# Patient Record
Sex: Female | Born: 1960 | Race: White | Hispanic: No | Smoking: Never smoker
Health system: Southern US, Community
[De-identification: ages and names within clinical notes are randomized; demographics above are authoritative.]

## PROBLEM LIST (undated history)

## (undated) ENCOUNTER — Ambulatory Visit: Admission: EM | Payer: BC Managed Care – PPO | Source: Home / Self Care

## (undated) DIAGNOSIS — E039 Hypothyroidism, unspecified: Secondary | ICD-10-CM

## (undated) DIAGNOSIS — Z1239 Encounter for other screening for malignant neoplasm of breast: Secondary | ICD-10-CM

## (undated) DIAGNOSIS — Z1211 Encounter for screening for malignant neoplasm of colon: Secondary | ICD-10-CM

## (undated) DIAGNOSIS — Z9989 Dependence on other enabling machines and devices: Secondary | ICD-10-CM

## (undated) DIAGNOSIS — E669 Obesity, unspecified: Secondary | ICD-10-CM

## (undated) DIAGNOSIS — I509 Heart failure, unspecified: Secondary | ICD-10-CM

## (undated) DIAGNOSIS — Z1389 Encounter for screening for other disorder: Secondary | ICD-10-CM

## (undated) DIAGNOSIS — G4730 Sleep apnea, unspecified: Secondary | ICD-10-CM

## (undated) DIAGNOSIS — J189 Pneumonia, unspecified organism: Secondary | ICD-10-CM

## (undated) DIAGNOSIS — J449 Chronic obstructive pulmonary disease, unspecified: Secondary | ICD-10-CM

## (undated) DIAGNOSIS — I4891 Unspecified atrial fibrillation: Secondary | ICD-10-CM

## (undated) DIAGNOSIS — I10 Essential (primary) hypertension: Secondary | ICD-10-CM

## (undated) DIAGNOSIS — R928 Other abnormal and inconclusive findings on diagnostic imaging of breast: Secondary | ICD-10-CM

## (undated) HISTORY — DX: Other abnormal and inconclusive findings on diagnostic imaging of breast: R92.8

## (undated) HISTORY — DX: Pneumonia, unspecified organism: J18.9

## (undated) HISTORY — DX: Heart failure, unspecified: I50.9

## (undated) HISTORY — DX: Unspecified atrial fibrillation: I48.91

## (undated) HISTORY — DX: Sleep apnea, unspecified: G47.30

## (undated) HISTORY — DX: Encounter for screening for other disorder: Z13.89

## (undated) HISTORY — DX: Encounter for screening for malignant neoplasm of colon: Z12.11

## (undated) HISTORY — DX: Hypothyroidism, unspecified: E03.9

## (undated) HISTORY — DX: Encounter for other screening for malignant neoplasm of breast: Z12.39

## (undated) HISTORY — DX: Obesity, unspecified: E66.9

## (undated) HISTORY — DX: Essential (primary) hypertension: I10

---

## 2001-04-10 HISTORY — PX: ANTERIOR CRUCIATE LIGAMENT REPAIR: SHX115

## 2004-07-03 ENCOUNTER — Emergency Department: Payer: Self-pay | Admitting: General Practice

## 2008-07-30 ENCOUNTER — Ambulatory Visit: Payer: Self-pay | Admitting: Unknown Physician Specialty

## 2009-04-10 HISTORY — PX: ABDOMINAL HYSTERECTOMY: SHX81

## 2009-04-10 HISTORY — PX: COLONOSCOPY: SHX174

## 2010-02-02 ENCOUNTER — Ambulatory Visit: Payer: Self-pay | Admitting: Unknown Physician Specialty

## 2010-02-15 ENCOUNTER — Ambulatory Visit: Payer: Self-pay | Admitting: Unknown Physician Specialty

## 2010-02-18 ENCOUNTER — Ambulatory Visit: Payer: Self-pay | Admitting: Unknown Physician Specialty

## 2010-02-25 ENCOUNTER — Ambulatory Visit: Payer: Self-pay | Admitting: Unknown Physician Specialty

## 2010-03-16 ENCOUNTER — Ambulatory Visit: Payer: Self-pay | Admitting: Unknown Physician Specialty

## 2010-03-23 ENCOUNTER — Inpatient Hospital Stay: Payer: Self-pay | Admitting: Unknown Physician Specialty

## 2010-03-25 LAB — PATHOLOGY REPORT

## 2010-09-16 ENCOUNTER — Ambulatory Visit: Payer: Self-pay | Admitting: Family Medicine

## 2010-09-23 ENCOUNTER — Ambulatory Visit: Payer: Self-pay | Admitting: Urology

## 2010-09-26 ENCOUNTER — Ambulatory Visit: Payer: Self-pay | Admitting: Urology

## 2011-04-11 DIAGNOSIS — R928 Other abnormal and inconclusive findings on diagnostic imaging of breast: Secondary | ICD-10-CM

## 2011-04-11 HISTORY — DX: Other abnormal and inconclusive findings on diagnostic imaging of breast: R92.8

## 2011-04-11 HISTORY — PX: KNEE SURGERY: SHX244

## 2011-11-13 ENCOUNTER — Ambulatory Visit: Payer: Self-pay | Admitting: General Practice

## 2011-11-13 LAB — URINALYSIS, COMPLETE
Bilirubin,UR: NEGATIVE
Glucose,UR: NEGATIVE mg/dL (ref 0–75)
Ketone: NEGATIVE
Nitrite: NEGATIVE
Specific Gravity: 1.023 (ref 1.003–1.030)

## 2011-11-13 LAB — BASIC METABOLIC PANEL
Anion Gap: 7 (ref 7–16)
Calcium, Total: 9.4 mg/dL (ref 8.5–10.1)
Chloride: 104 mmol/L (ref 98–107)
Co2: 29 mmol/L (ref 21–32)
Creatinine: 0.86 mg/dL (ref 0.60–1.30)
EGFR (African American): >60
Osmolality: 281 (ref 275–301)

## 2011-11-13 LAB — CBC
HGB: 13.2 g/dL (ref 12.0–16.0)
MCH: 28 pg (ref 26.0–34.0)
MCV: 82 fL (ref 80–100)
RBC: 4.71 10*6/uL (ref 3.80–5.20)
WBC: 8 10*3/uL (ref 3.6–11.0)

## 2011-11-14 LAB — URINE CULTURE

## 2011-11-22 ENCOUNTER — Ambulatory Visit: Payer: Self-pay | Admitting: Family Medicine

## 2011-11-24 ENCOUNTER — Ambulatory Visit: Payer: Self-pay | Admitting: Family Medicine

## 2011-11-27 ENCOUNTER — Inpatient Hospital Stay: Payer: Self-pay

## 2011-11-28 LAB — BASIC METABOLIC PANEL
Anion Gap: 6 — ABNORMAL LOW (ref 7–16)
BUN: 13 mg/dL (ref 7–18)
Calcium, Total: 8.1 mg/dL — ABNORMAL LOW (ref 8.5–10.1)
Chloride: 107 mmol/L (ref 98–107)
Co2: 29 mmol/L (ref 21–32)
Creatinine: 0.78 mg/dL (ref 0.60–1.30)
EGFR (African American): >60
Osmolality: 285 (ref 275–301)
Potassium: 3.8 mmol/L (ref 3.5–5.1)

## 2011-11-28 LAB — PLATELET COUNT: Platelet: 147 10*3/uL — ABNORMAL LOW (ref 150–440)

## 2011-11-28 LAB — HEMOGLOBIN: HGB: 10.1 g/dL — ABNORMAL LOW (ref 12.0–16.0)

## 2011-11-29 LAB — BASIC METABOLIC PANEL
BUN: 11 mg/dL (ref 7–18)
Calcium, Total: 8.2 mg/dL — ABNORMAL LOW (ref 8.5–10.1)
Chloride: 106 mmol/L (ref 98–107)
Co2: 30 mmol/L (ref 21–32)
EGFR (Non-African Amer.): >60
Glucose: 121 mg/dL — ABNORMAL HIGH (ref 65–99)
Osmolality: 282 (ref 275–301)
Potassium: 4.1 mmol/L (ref 3.5–5.1)
Sodium: 141 mmol/L (ref 136–145)

## 2011-11-29 LAB — HEMOGLOBIN: HGB: 9.8 g/dL — ABNORMAL LOW (ref 12.0–16.0)

## 2011-11-29 LAB — PLATELET COUNT: Platelet: 119 10*3/uL — ABNORMAL LOW (ref 150–440)

## 2011-12-12 ENCOUNTER — Ambulatory Visit: Payer: Self-pay | Admitting: Family Medicine

## 2012-01-09 ENCOUNTER — Ambulatory Visit: Payer: Self-pay | Admitting: Cardiology

## 2012-01-16 ENCOUNTER — Ambulatory Visit: Payer: Self-pay | Admitting: Cardiology

## 2012-01-22 ENCOUNTER — Ambulatory Visit: Payer: Self-pay | Admitting: General Surgery

## 2012-01-22 HISTORY — PX: BREAST BIOPSY: SHX20

## 2012-03-13 ENCOUNTER — Ambulatory Visit: Payer: Self-pay | Admitting: Cardiology

## 2012-06-08 ENCOUNTER — Encounter: Payer: Self-pay | Admitting: General Surgery

## 2012-07-23 ENCOUNTER — Ambulatory Visit: Payer: Self-pay | Admitting: General Surgery

## 2012-07-29 ENCOUNTER — Ambulatory Visit: Payer: Self-pay | Admitting: General Surgery

## 2012-08-14 ENCOUNTER — Encounter: Payer: Self-pay | Admitting: *Deleted

## 2014-02-09 ENCOUNTER — Encounter: Payer: Self-pay | Admitting: General Surgery

## 2014-06-17 DIAGNOSIS — I4819 Other persistent atrial fibrillation: Secondary | ICD-10-CM | POA: Diagnosis present

## 2014-07-23 ENCOUNTER — Ambulatory Visit
Admit: 2014-07-23 | Disposition: A | Discharge status: Home / Self Care | Payer: Self-pay | Attending: Cardiology | Admitting: Cardiology

## 2014-07-28 NOTE — Discharge Summary (Signed)
PATIENT NAME:  Katherine Scott, Katherine Scott MR#:  [id number] DATE OF BIRTH:  October 02, 1960  DATE OF ADMISSION:  11/27/2011 DATE OF DISCHARGE:  11/30/2011  ADMITTING DIAGNOSES:  1. Degenerative arthrosis of the right knee.  2. Morbid obesity.  DISCHARGE DIAGNOSES:  1. Degenerative arthrosis of the right knee.  2. Atrial fibrillation.  3. Consultation with cardiology, Dr. Harold HedgeKenneth Fath.  4. Morbid obesity.  HISTORY: The patient is a 54 year old who has been followed at [facility] for progression of right knee pain. She had reported continued severe bilateral knee pain with the right being more symptomatic than the left. The patient states that she had been unable to exercise due to the severity of her pain. She had localized most of the pain along the medial aspect of the knee. At the time of surgery, she was using a cane for ambulation. The patient had not seen any significant improvement in her condition despite the use of anti-inflammatory medications, Synvisc injections, activity modification, and Lidoderm patches. The patient states that the pain had increased to the point that it was significantly interfering with her activities of daily living. [facility] taken in [facility] orthopedic department showed severe destruction of the knee. She was noted to have severe valgus deformity measuring approximately 16 to 20 degrees. She had bone-on-bone. She was noted to have significant osteophyte as well as subchondral sclerosis.   PROCEDURES:  1. Removal of two interference screws.  2. Right total knee arthroplasty using computer-assisted navigation.   ANESTHESIA: Femoral nerve block with spinal.   SOFT TISSUE RELEASE: Anterior cruciate ligament, posterior cruciate ligament, deep and superficial medial collateral ligaments, as well as the patellofemoral ligament.   IMPLANTS UTILIZED: DePuy PFC Sigma size 3 posterior stabilized femoral component (cemented), size 3 MBT tibial component (cemented), 35 mm  three pegged oval dome patella (cemented), and a 15 mm stabilized rotating polyethylene insert.   HOSPITAL COURSE: The patient tolerated the procedure very well. However, during the procedure she did go into atrial fibrillation. She was monitored throughout the procedure with no other evidence of complications. Her EKG however was otherwise unremarkable. Postoperatively she was taken to the PAC-U where she was stabilized. A cardiology consult was obtained by Dr. Mariel KanskyKen Fath. At the time of the consult, her CHADS score was 0. Based on the CHADS score, he recommended continuing with Lovenox and place her on 325 mg of aspirin and initially changed her from metoprolol to Cardizem 30 mg every six hours. She was placed on off-unit telemetry. She was then taken to the orthopedic floor where she was begun on additional anticoagulation therapy of TED stockings bilaterally. These were allowed to be removed one hour per eight hour shift. The right one was applied on day two following removal of the Hemovac and dressing change. She was also fitted with the AV-I compression foot pumps bilaterally set at 80 mmHg. Her calves have been nontender. There has been no evidence of any deep venous thromboses. She has voiced no complaints. Heels were elevated off the bed using rolled towels.   She has denied any chest pain or shortness of breath. Vital signs have been stable. She has been afebrile. Her EKG throughout the entire course has been atrial fibrillation with a controlled rate, however, on day two she did spike a heart rate of 165 but after further evaluation this appeared to be related to pain issues. Once the pain was under control her rate dropped back down to an acceptable level. Still she has had  no chest pain or any shortness of breath. She has no known history of any cardiac or palpitations.   Physical therapy was initiated on day one for gait training and transfers. Upon being discharged she was ambulating 200 feet.  She was able to go up and down four sets of steps. She was independent with bed to chair transfers. Occupational therapy was also initiated on day one for activities of daily living and assistive devices.   The patient's IV, Foley and Hemovac were discontinued on day two along with a dressing change. The Polar Care was reapplied to the surgical leg maintaining a temperature of 40 to 50 degrees Fahrenheit.   The patient is being discharged to home in improved condition. I did contact Dr. Lady Gary on the morning of discharge and he recommended that she go home on 240 mg of Cardizem once a day. A prescription for this was written for 30 days. He will contact her as far as the followup appointment to be seen in the office. She will receive home health physical therapy. She is to continue weight-bearing as tolerated and continue using a walker until cleared by physical therapy to go to a quad cane. She will continue with TED stockings. These are to be worn during the day but may be removed at night. Continue Polar Care maintaining a temperature of 40 to 50 degrees Fahrenheit. Change dressing as needed. She will follow-up in the office in two weeks for removal of staples. She is not to a shower until the staples are removed. She is placed on a regular diet. She is to resume her regular medication that she was on prior to admission. She was given a prescription for Lovenox 40 mg subcutaneously daily for 14 days and then discontinue and begin taking one 325 mg enteric-coated aspirin that was started in the hospital following her atrial fibrillation and a prescription for Ultram 50 to 100 mg every 4 to 6 hours p.r.n. for pain and  Roxicodone 5 to 10 mg every 4 to 6 hours p.r.n. for pain as well as Cardizem ER 240 mg one tablet daily. She is to call the clinic if she has any temperatures of 101.5 or greater or excessive bleeding.   PAST MEDICAL HISTORY:  1. Shingles.  2. Menstrual  problems. 3. Obesity. ____________________________ Van Clines, PA jrw:slb D: 11/30/2011 12:46:16 ET T: 11/30/2011 13:33:55 ET JOB#: [id number]  cc: Van Clines, PA, <Dictator> Jamar Casagrande PA ELECTRONICALLY SIGNED 12/03/2011 19:39

## 2014-07-28 NOTE — Op Note (Signed)
PATIENT NAME:  Katherine Scott, Katherine Scott MR#:  [id number] DATE OF BIRTH:  04-04-1961  DATE OF PROCEDURE:  11/27/2011  PREOPERATIVE DIAGNOSES:  1. Severe degenerative arthrosis of the right knee.  2. Status post right anterior cruciate ligament reconstruction (2003).   POSTOPERATIVE DIAGNOSES:  1. Severe degenerative arthrosis of the right knee.  2. Status post right anterior cruciate ligament reconstruction (2003).   PROCEDURES PERFORMED:  1. Removal of two interference screws.  2. Right total knee arthroplasty using computer-assisted navigation.   SURGEON: Illene Labrador. Hooten, M.D.   ASSISTANT: Van Clines, PA-C  ANESTHESIA: Femoral nerve block and spinal.   ESTIMATED BLOOD LOSS: 800 mL.   FLUIDS REPLACED: 3,200 mL of crystalloid.   TOURNIQUET TIMES: 125 minutes and 19 minutes.   DRAINS: Two medium drains to reinfusion system.   SOFT TISSUE RELEASES: Anterior cruciate ligament, posterior cruciate ligament, deep and superficial medial collateral ligament, and patellofemoral ligament.   IMPLANTS UTILIZED: DePuy PFC Sigma size 3 posterior stabilized femoral component (cemented), size 3 MBT tibial component (cemented), 35 mm three peg oval dome patella (cemented), and a 15 mm stabilized rotating platform polyethylene insert.   INDICATIONS FOR SURGERY: The patient is a 54 year old female who underwent right bone patellar tendon bone anterior cruciate ligament reconstruction approximately 10 years ago. The patient has had progressive right knee pain and varus deformity over the last several years. X-rays have demonstrated severe degenerative changes. After discussion of the risks and benefits of surgical intervention, the patient expressed her understanding of the risks and benefits and agreed with plans for surgical intervention.   PROCEDURE IN DETAIL: The patient was brought to the Operating Room and, after adequate femoral nerve block and spinal anesthesia was achieved, a tourniquet was placed on the  patient's upper right thigh. The patient's right knee and leg were cleaned and prepped with alcohol and DuraPrep and draped in the usual sterile fashion. A "time out" was performed as per usual protocol. The right lower extremity was exsanguinated using an Esmarch, and the tourniquet was inflated to 300 mmHg. Anterior longitudinal incision was made followed by a standard mid vastus approach. A large effusion was evacuated. The deep fibers of the medial collateral ligament were elevated in subperiosteal fashion off the medial flare of the tibia so as to maintain a continuous soft tissue sleeve. The patella was subluxed laterally and the patellofemoral ligament was incised so as to better mobilize the patella. Inspection of the knee demonstrated severe degenerative changes with eburnated bone noted to the medial and lateral compartments. The previous anterior cruciate ligament reconstruction was severely attenuated due to impingement within the notch. Anterior cruciate ligament and posterior cruciate ligament were excised. Large osteophytes were debrided using a rongeur. Two 4.0 mm Schanz pins were inserted into the femur and into the tibia for attachment of the ray of trackers used for computer-assisted navigation. The hip center was identified using circumduction technique. Distal landmarks were mapped using the computer. The distal femur and proximal tibia were mapped using the computer. Distal femoral cutting guide was positioned using computer-assisted navigation so as to achieve 5 degree distal valgus cut. Cut was performed and verified using the computer. The distal femur was sized and it was felt that a size 3 femoral component was appropriate. Size 3 cutting guide was positioned and the anterior cut was performed and verified using the computer. This was followed by completion of the posterior and chamfer cuts. Femoral cutting guide for the central box was then positioned and central box  cut was performed.  The femoral interference screw was encountered and this was backed out without difficulty using the appropriate screwdriver. Attention was then directed to the proximal tibia. Medial and lateral menisci were excised. The extramedullary tibial cutting guide was positioned using computer-assisted navigation so as to achieve 0 degree varus valgus alignment and 0 degree posterior slope. The interference screw for the tibial tunnel was encountered with securing the extramedullary cutting guide. A small TPS bur was used to clear bone from around the periphery of the screw head and the screw was backed out without difficulty. The proximal tibia cut was performed and verified using the computer. The proximal tibia was sized and it was felt that a size 3 tibial tray was appropriate. Tibial and femoral trials were inserted followed by insertion of a 10 mm polyethylene insert. The knee was felt to be tight medially. A Cobb elevator was used to elevate the superficial fibers of the medial collateral ligament. The 10 mm polyethylene trial was replaced with first a 12.5 and subsequently a 15 mm polyethylene. This allowed for excellent mediolateral soft tissue balancing both in full extension and in 90 degrees of flexion. Finally, patella was cut and prepared so as to accommodate a 35 mm three peg oval dome patella. Patellar trial was placed and the knee was placed through a range of motion with excellent patellar tracking appreciated.   Femoral trial was removed. Central post hole for the tibial component was reamed followed by insertion of a keel punch. Tibial tray was then removed. The cut surfaces of bone were irrigated with copious amounts of normal saline with antibiotic solution using pulsatile lavage and then suctioned dry. Polymethyl methacrylate cement with gentamicin was prepared in the usual fashion using a vacuum mixer. Cement was applied to the cut surface of the proximal tibia as well as along the undersurface of  a size 3 MBT tibial component. The tibial component was positioned and impacted into place. Excess cement was removed using freer elevators. Cement was applied to the cut surface of the femur as well as along the posterior flanges of a size 3 posterior stabilized femoral component. Femoral component was positioned and impacted into place. Excess cement was removed using freer elevators. A 15 mm polyethylene trial was inserted and the knee was brought in full extension with steady axial compression applied. Finally, cement was applied to the backside of a 35 mm three peg oval dome patella and the patellar component was positioned and patellar clamp applied. Excess cement was removed using freer elevators.   After adequate curing of cement, the tourniquet was deflated after second tourniquet time of 19 minutes. The tourniquet had been released after initial tourniquet time of 125 minutes at the point when soft tissue balancing was performed. Hemostasis was achieved using electrocautery. The knee was irrigated with copious amounts of normal saline with antibiotic solution using pulsatile lavage and then suctioned dry. The knee was inspected for any residual cement debris. 30 mL of 0.25% Marcaine with epinephrine was injected along the posterior capsule. A 15 mm stabilized rotating platform polyethylene insert was inserted and the knee was placed through a range of motion with excellent patellar tracking appreciated. Excellent mediolateral soft tissue balancing was appreciated. Two medium drains were placed in the wound bed and brought out through a separate stab incision to be attached to a reinfusion system. The medial parapatellar portion of the incision was reapproximated using interrupted sutures of #1 Vicryl. The subcutaneous tissue  was approximated in layers using first #0 Vicryl followed #2-0 Vicryl. Skin was closed with skin staples. A sterile dressing was applied.   The patient tolerated the  procedure well. She was transported to the recovery room in stable condition. ____________________________ Illene LabradorJames P. Angie FavaHooten Jr., MD jph:slb D: 11/27/2011 23:14:16 ET T: 11/28/2011 10:34:53 ET JOB#: [id number]  cc: Fayrene FearingJames P. Angie FavaHooten Jr., MD, <Dictator> JAMES P Angie FavaHOOTEN JR MD ELECTRONICALLY SIGNED 11/29/2011 0:53

## 2014-07-28 NOTE — Consult Note (Signed)
General Aspect patient is a 54 year old female with no prior cardiac history who was noted to have atrial fibrillation with variable ventricular response after total knee replacement.  She denies any history of rapid or right heartbeat.  She denies any history of chest pain or shortness of breath.  She was hemodynamically stable throughout the procedure and remains asymptomatic.  EKG reveals atrial fibrillation with variable ventricular response.  She denies shortness of breath, chest pain.  She has no history of syncope or presyncope.  She she does admit to being told that she  snores when she sleeps.  She has not been diagnosis sleep apnea and the past   Physical Exam:   GEN obese    HEENT PERRL    NECK supple    RESP clear BS    CARD Irregular rate and rhythm  No murmur    ABD denies tenderness  normal BS    LYMPH negative neck    EXTR pneumatic stockings in place    SKIN normal to palpation    NEURO cranial nerves intact, motor/sensory function intact    PSYCH A+O to time, place, person   Review of Systems:   Subjective/Chief Complaint mild incisional pain.  No chest pain or shortness of breath    General: No Complaints    Skin: No Complaints    ENT: No Complaints    Eyes: No Complaints    Neck: No Complaints    Respiratory: No Complaints    Cardiovascular: No Complaints    Gastrointestinal: No Complaints    Genitourinary: No Complaints    Vascular: No Complaints    Musculoskeletal: No Complaints    Neurologic: No Complaints    Hematologic: No Complaints    Endocrine: No Complaints    Psychiatric: No Complaints    Review of Systems: All other systems were reviewed and found to be negative    Medications/Allergies Reviewed Medications/Allergies reviewed   Home Medications: Medication Instructions Status  Vitamin B12  2   once a day AM Active  Tylenol with Codeine #3 oral tablet 1 -2 tab(s) orally every 3 to 4 hours,for Pain Active   tramadol 50 mg oral tablet 1 tab(s) orally every 4 hours Active   EKG:   Interpretation atrial fibrillation with variable ventricular response    No Known Allergies:     Impression female with no prior cardiac history who was noted to be in atrial fibrillation with variable ventricular response during total knee surgery.  She remains in atrial fibrillation postoperatively.  She is unaware of this.  She denies chest pain or shortness of breath.  She denies sensation of a rapid or irregular heartbeat.  She has no prior cardiac history.  She denies hypertension syncope rapid or irregular heartbeat.  Her family states that she snores heavily when she sleeps.  She has not been evaluated for sleep apnea in the past.  Etiology of her atrial fibrillation could be related sleep apnea versus stress of surgery with sedating agents.  We'll need to assess her left ventricular function.  Would add beta blockers at low dose 25 mg twice daily to attempt to control her rate and convert her back to sinus rhythm.  Should she remain in atrial fibrillation for more than 24 hours, would consider adding anticoagulation.  Will place her on off unit to telemetry to follow her heart rate.    Plan 1.  Will see to telemetry 2.  Metoprolol tartrate 25 mg twice daily to control  rate as blood pressure allows 3.  Echocardiogram to evaluate atrial size, valvular disease and LV function 4.  Overnight pulse oximetry to assess for evidence of nocturnal hypoxia consistent with possible sleep apnea 5. further recommendations depending on the patient's course   Electronic Signatures: Dalia HeadingFath, Kenyan Karnes A (MD)  (Signed 19-Aug-13 20:18)  Authored: General Aspect/Present Illness, History and Physical Exam, Review of System, Home Medications, EKG , Allergies, Impression/Plan   Last Updated: 19-Aug-13 20:18 by Dalia HeadingFath, Naesha Buckalew A (MD)

## 2014-09-14 ENCOUNTER — Other Ambulatory Visit: Payer: Self-pay | Admitting: Family Medicine

## 2014-12-03 ENCOUNTER — Ambulatory Visit: Payer: BLUE CROSS/BLUE SHIELD | Admitting: Anesthesiology

## 2014-12-03 ENCOUNTER — Encounter
Admission: RE | Disposition: A | Discharge status: Home / Self Care | Payer: Self-pay | Source: Ambulatory Visit | Attending: Cardiology

## 2014-12-03 ENCOUNTER — Ambulatory Visit
Admission: RE | Admit: 2014-12-03 | Discharge: 2014-12-03 | Disposition: A | Discharge status: Home / Self Care | Payer: BLUE CROSS/BLUE SHIELD | Source: Ambulatory Visit | Attending: Cardiology | Admitting: Cardiology

## 2014-12-03 ENCOUNTER — Encounter: Payer: Self-pay | Admitting: *Deleted

## 2014-12-03 DIAGNOSIS — Z6841 Body Mass Index (BMI) 40.0 and over, adult: Secondary | ICD-10-CM | POA: Diagnosis not present

## 2014-12-03 DIAGNOSIS — Z7982 Long term (current) use of aspirin: Secondary | ICD-10-CM | POA: Insufficient documentation

## 2014-12-03 DIAGNOSIS — I10 Essential (primary) hypertension: Secondary | ICD-10-CM | POA: Diagnosis not present

## 2014-12-03 DIAGNOSIS — E079 Disorder of thyroid, unspecified: Secondary | ICD-10-CM | POA: Diagnosis not present

## 2014-12-03 DIAGNOSIS — E785 Hyperlipidemia, unspecified: Secondary | ICD-10-CM | POA: Insufficient documentation

## 2014-12-03 DIAGNOSIS — I480 Paroxysmal atrial fibrillation: Secondary | ICD-10-CM | POA: Insufficient documentation

## 2014-12-03 DIAGNOSIS — M1990 Unspecified osteoarthritis, unspecified site: Secondary | ICD-10-CM | POA: Diagnosis not present

## 2014-12-03 DIAGNOSIS — E669 Obesity, unspecified: Secondary | ICD-10-CM | POA: Insufficient documentation

## 2014-12-03 DIAGNOSIS — G4730 Sleep apnea, unspecified: Secondary | ICD-10-CM | POA: Insufficient documentation

## 2014-12-03 DIAGNOSIS — Z79899 Other long term (current) drug therapy: Secondary | ICD-10-CM | POA: Diagnosis not present

## 2014-12-03 HISTORY — PX: ELECTROPHYSIOLOGIC STUDY: SHX172A

## 2014-12-03 SURGERY — CARDIOVERSION (CATH LAB)
Anesthesia: General

## 2014-12-03 MED ORDER — SODIUM CHLORIDE 0.9 % IV SOLN
INTRAVENOUS | Status: DC
  Administered 2014-12-03: 07:00:00 via INTRAVENOUS

## 2014-12-03 MED ORDER — HYDROCORTISONE 1 % EX CREA
1.0000 "application " | TOPICAL_CREAM | Freq: Three times a day (TID) | CUTANEOUS | Status: DC | PRN
  Filled 2014-12-03: qty 28

## 2014-12-03 MED ORDER — PROPOFOL 10 MG/ML IV BOLUS
INTRAVENOUS | Status: DC | PRN
  Administered 2014-12-03: 70 mg via INTRAVENOUS

## 2014-12-03 NOTE — H&P (Signed)
Chief Complaint: Chief Complaint  Patient presents with  . Follow-up  . Atrial Fibrillation  increased flecainide to 150mg  daily  Date of Service: 11/18/2014 Date of Birth: 1960/07/26 PCP: Janeann Forehand, MD  History of Present Illness: Katherine Scott is a 54 y.o.female patient who returns for follow-up visit. Was recently seen with an attempt at cardioversion. She again did not convert to sinus rhythm. She was on flecainide at 100 mg twice daily. She continues to have symptomatic atrial fibrillation. She recently had her flecainide increased to 150 mg twice daily and now returns for follow-up for consideration for repeat attempt at cardioversion. She continues to anticoagulated with Xarelto.  Past Medical and Surgical History  Past Medical History Past Medical History  Diagnosis Date  . Thyroid disease  . Obesity  . Hyperlipidemia  . Arthritis  . Hypertension  . Atrial fibrillation  . Sleep apnea   Past Surgical History She has past surgical history that includes Abdominal hysterectomy.   Medications and Allergies  Current Medications  Current Outpatient Prescriptions  Medication Sig Dispense Refill  . albuterol 90 mcg/actuation inhaler Inhale 2 inhalations into the lungs every 6 (six) hours as needed for Wheezing.  Marland Kitchen aspirin 325 MG tablet Take 325 mg by mouth once daily.  . cyanocobalamin (VITAMIN B12) 1000 MCG tablet Take 1,000 mcg by mouth once daily.  Marland Kitchen diltiazem (CARDIZEM CD) 240 MG CD capsule Take 1 capsule (240 mg total) by mouth once daily. 90 capsule 3  . flecainide (TAMBOCOR) 100 MG tablet Take 1.5 tablets (150 mg total) by mouth 2 (two) times daily. 60 tablet 11  . levothyroxine (SYNTHROID, LEVOTHROID) 25 MCG tablet Take 25 mcg by mouth once daily. Take on an empty stomach with a glass of water at least 30-60 minutes before breakfast.  . naproxen sodium (ALEVE, ANAPROX) 220 MG tablet Take 220 mg by mouth 2 (two) times daily as needed for Pain.  . rivaroxaban  (XARELTO) 20 mg tablet Take 1 tablet (20 mg total) by mouth once daily. 30 tablet 11   No current facility-administered medications for this visit.   Allergies: Review of patient's allergies indicates no known allergies.  Social and Family History  Social History reports that she has never smoked. She does not have any smokeless tobacco history on file. She reports that she drinks alcohol. She reports that she does not use illicit drugs.  Family History Family History  Problem Relation Age of Onset  . Heart disease Father  . Arthritis Mother  . Colon cancer Mother   Review of Systems  Review of Systems  Constitutional: Negative for fever, chills, weight loss, malaise/fatigue and diaphoresis.  HENT: Negative for congestion, ear discharge, hearing loss and tinnitus.  Eyes: Negative for blurred vision.  Respiratory: Positive for shortness of breath. Negative for cough, hemoptysis, sputum production and wheezing.  Cardiovascular: Negative for chest pain, orthopnea, claudication, leg swelling and PND.  Gastrointestinal: Negative for heartburn, nausea, vomiting, abdominal pain, diarrhea, constipation, blood in stool and melena.  Genitourinary: Negative for dysuria, urgency, frequency and hematuria.  Musculoskeletal: Negative for myalgias, back pain, joint pain and falls.  Skin: Negative for itching and rash.  Neurological: Negative for dizziness, tingling, focal weakness, loss of consciousness, weakness and headaches.  Endo/Heme/Allergies: Negative for polydipsia. Does not bruise/bleed easily.  Psychiatric/Behavioral: Negative for depression, memory loss and substance abuse. The patient is not nervous/anxious.    Physical Examination   Vitals:BP 120/72 mmHg  Resp 14  Ht 160 cm (5\' 3" )  Wt 153.316 kg (338 lb)  BMI 59.89 kg/m2 Ht:160 cm ( ) Wt:153.316 kg (338 lb) ZOX:WRUE surface area is 2.61 meters squared. Body mass index is 59.89 kg/(m^2).  Wt Readings from Last 3  Encounters:  11/18/14 153.316 kg (338 lb)  08/17/14 146.965 kg (324 lb)  07/31/14 146.512 kg (323 lb)   BP Readings from Last 3 Encounters:  11/18/14 120/72  08/17/14 130/70  07/31/14 130/88   General appearance appears in no acute distress  Head Mouth and Eye exam Normocephalic, without obvious abnormality, atraumatic Dentition is good Eyes appear anicteric   Neck exam Thyroid: normal  Nodes: no obvious adenopathy  LUNGS Breath Sounds: Normal Percussion: Normal  CARDIOVASCULAR JVP CV wave: no HJR: no Elevation at 90 degrees: None Carotid Pulse: normal pulsation bilaterally Bruit: None Apex: apical impulse normal  Auscultation Rhythm: atrial fibrillation S1: normal S2: normal Clicks: no Rub: no Murmurs: no murmurs  Gallop: None ABDOMEN Liver enlargement: no Pulsatile aorta: no Ascites: no Bruits: no  EXTREMITIES Clubbing: no Edema: trace to 1+ bilateral pedal edema Pulses: peripheral pulses symmetrical Femoral Bruits: no Amputation: no SKIN Rash: no Cyanosis: no Embolic phemonenon: no Bruising: no NEURO Alert and Oriented to person, place and time: yes Non focal: yes  PSYCH: Pt appears to have normal affect  LABS REVIEWED  Lab Results  Component Value Date  CREATININE 0.9 07/13/2014  BUN 13 07/13/2014  NA 142 07/13/2014  K 4.3 07/13/2014  CL 105 07/13/2014  CO2 31.5 07/13/2014   Assessment and Plan   54 y.o. female with  ICD-10-CM ICD-9-CM  1. Atrial fibrillation, unspecified type-atrial fibrillation is rate controlled but did not convert with attempted cardioversion. Will increase flecainide 150 mg daily and continue with Cardizem and Xarelto and re-attempt cardioversion . I48.91 427.31   2. Paroxysmal atrial fibrillation I48.0 427.31   Return in about 4 weeks (around 12/16/2014).  These notes generated with voice recognition software. I apologize for typographical errors.  Denton Ar, MD

## 2014-12-03 NOTE — Anesthesia Preprocedure Evaluation (Signed)
Anesthesia Evaluation  Patient identified by MRN, date of birth, ID band Patient awake    Reviewed: Allergy & Precautions, NPO status , Patient's Chart, lab work & pertinent test results, reviewed documented beta blocker date and time   Airway Mallampati: II  TM Distance: >3 FB     Dental  (+) Chipped   Pulmonary          Cardiovascular hypertension, Pt. on medications + dysrhythmias Atrial Fibrillation     Neuro/Psych    GI/Hepatic   Endo/Other    Renal/GU      Musculoskeletal   Abdominal   Peds  Hematology   Anesthesia Other Findings   Reproductive/Obstetrics                             Anesthesia Physical Anesthesia Plan  ASA: III  Anesthesia Plan: General   Post-op Pain Management:    Induction: Intravenous  Airway Management Planned: Nasal Cannula  Additional Equipment:   Intra-op Plan:   Post-operative Plan:   Informed Consent: I have reviewed the patients History and Physical, chart, labs and discussed the procedure including the risks, benefits and alternatives for the proposed anesthesia with the patient or authorized representative who has indicated his/her understanding and acceptance.     Plan Discussed with: CRNA  Anesthesia Plan Comments:         Anesthesia Quick Evaluation

## 2014-12-03 NOTE — Discharge Instructions (Signed)
Call Dr Don Perking office and schedule follow up appt in one week. Post carioversion Electrical Cardioversion, Care After Refer to this sheet in the next few weeks. These instructions provide you with information on caring for yourself after your procedure. Your health care provider may also give you more specific instructions. Your treatment has been planned according to current medical practices, but problems sometimes occur. Call your health care provider if you have any problems or questions after your procedure. WHAT TO EXPECT AFTER THE PROCEDURE After your procedure, it is typical to have the following sensations:  Some redness on the skin where the shocks were delivered. If this is tender, a sunburn lotion or hydrocortisone cream may help.  Possible return of an abnormal heart rhythm within hours or days after the procedure. HOME CARE INSTRUCTIONS  Take medicines only as directed by your health care provider. Be sure you understand how and when to take your medicine.  Learn how to feel your pulse and check it often.  Limit your activity for 48 hours after the procedure or as directed by your health care provider.  Avoid or minimize caffeine and other stimulants as directed by your health care provider. SEEK MEDICAL CARE IF:  You feel like your heart is beating too fast or your pulse is not regular.  You have any questions about your medicines.  You have bleeding that will not stop. SEEK IMMEDIATE MEDICAL CARE IF:  You are dizzy or feel faint.  It is hard to breathe or you feel short of breath.  There is a change in discomfort in your chest.  Your speech is slurred or you have trouble moving an arm or leg on one side of your body.  You get a serious muscle cramp that does not go away.  Your fingers or toes turn cold or blue. Document Released: 01/15/2013 Document Revised: 08/11/2013 Document Reviewed: 01/15/2013 [facility] Patient Information 2015 [place], [place]. This  information is not intended to replace advice given to you by your health care provider. Make sure you discuss any questions you have with your health care provider.

## 2014-12-03 NOTE — CV Procedure (Signed)
Procedure: Cardioversion Indication: Symptomatic afib Sedation: Deep propofol sedation per Dept of Anesthesis  After informed consent, time out protocol and adequate sedation, pt received a 120J, 150J and 200 J syncrhonized biphasic dc shock. She had several conducted p waves but has recurrent afib. Unsuccessful cardioversion. Will discontinue flecanide and continue anticoagulation. No immediate complications. WIll follow up in 1 weeks and discuss further options to include alternative medications vs ablation.

## 2014-12-03 NOTE — Anesthesia Postprocedure Evaluation (Signed)
  Anesthesia Post-op Note  Patient: Katherine Scott  Procedure(s) Performed: Procedure(s): Cardioversion (N/A)  Anesthesia type:General  Patient location: PACU  Post pain: Pain level controlled  Post assessment: Post-op Vital signs reviewed, Patient's Cardiovascular Status Stable, Respiratory Function Stable, Patent Airway and No signs of Nausea or vomiting  Post vital signs: Reviewed and stable  Last Vitals:  Filed Vitals:   12/03/14 0813  BP: 133/87  Pulse: 73  Temp:   Resp: 18    Level of consciousness: awake, alert  and patient cooperative  Complications: No apparent anesthesia complications

## 2014-12-03 NOTE — Transfer of Care (Signed)
Immediate Anesthesia Transfer of Care Note  Patient: Katherine Scott  Procedure(s) Performed: Procedure(s): Cardioversion (N/A)  Patient Location: PACU  Anesthesia Type:General  Level of Consciousness: awake, alert  and oriented  Airway & Oxygen Therapy: Patient Spontanous Breathing and Patient connected to nasal cannula oxygen  Post-op Assessment: Report given to RN and Post -op Vital signs reviewed and stable  Post vital signs: Reviewed and stable  Last Vitals:  Filed Vitals:   12/03/14 0734  BP: 125/80  Pulse: 66  Temp: 36.1 C  Resp: 16    Complications: No apparent anesthesia complications

## 2014-12-18 ENCOUNTER — Encounter: Payer: Self-pay | Admitting: Cardiology

## 2015-02-18 ENCOUNTER — Encounter: Payer: Self-pay | Admitting: *Deleted

## 2015-02-18 ENCOUNTER — Inpatient Hospital Stay
Admission: AD | Admit: 2015-02-18 | Discharge: 2015-02-20 | DRG: 309 | Disposition: A | Discharge status: Home / Self Care | Payer: BLUE CROSS/BLUE SHIELD | Source: Ambulatory Visit | Attending: Cardiology | Admitting: Cardiology

## 2015-02-18 ENCOUNTER — Other Ambulatory Visit: Payer: Self-pay | Admitting: Cardiology

## 2015-02-18 DIAGNOSIS — E785 Hyperlipidemia, unspecified: Secondary | ICD-10-CM | POA: Diagnosis present

## 2015-02-18 DIAGNOSIS — M1990 Unspecified osteoarthritis, unspecified site: Secondary | ICD-10-CM | POA: Diagnosis present

## 2015-02-18 DIAGNOSIS — G4730 Sleep apnea, unspecified: Secondary | ICD-10-CM | POA: Diagnosis present

## 2015-02-18 DIAGNOSIS — I480 Paroxysmal atrial fibrillation: Secondary | ICD-10-CM | POA: Diagnosis present

## 2015-02-18 DIAGNOSIS — Z6841 Body Mass Index (BMI) 40.0 and over, adult: Secondary | ICD-10-CM

## 2015-02-18 DIAGNOSIS — E079 Disorder of thyroid, unspecified: Secondary | ICD-10-CM | POA: Diagnosis present

## 2015-02-18 DIAGNOSIS — E669 Obesity, unspecified: Secondary | ICD-10-CM | POA: Diagnosis present

## 2015-02-18 DIAGNOSIS — I4891 Unspecified atrial fibrillation: Secondary | ICD-10-CM | POA: Diagnosis present

## 2015-02-18 DIAGNOSIS — I10 Essential (primary) hypertension: Secondary | ICD-10-CM | POA: Diagnosis present

## 2015-02-18 DIAGNOSIS — I4819 Other persistent atrial fibrillation: Secondary | ICD-10-CM | POA: Diagnosis present

## 2015-02-18 LAB — BASIC METABOLIC PANEL
ANION GAP: 10 (ref 5–15)
BUN: 15 mg/dL (ref 6–20)
CHLORIDE: 105 mmol/L (ref 101–111)
CO2: 27 mmol/L (ref 22–32)
Calcium: 9.5 mg/dL (ref 8.9–10.3)
Creatinine, Ser: 0.81 mg/dL (ref 0.44–1.00)
GFR calc non Af Amer: >60 mL/min (ref >60)
Glucose, Bld: 114 mg/dL — ABNORMAL HIGH (ref 65–99)
POTASSIUM: 4 mmol/L (ref 3.5–5.1)
Sodium: 142 mmol/L (ref 135–145)

## 2015-02-18 LAB — MAGNESIUM: MAGNESIUM: 1.9 mg/dL (ref 1.7–2.4)

## 2015-02-18 MED ORDER — DOFETILIDE 250 MCG PO CAPS
500.0000 ug | ORAL_CAPSULE | Freq: Two times a day (BID) | ORAL | Status: DC
  Administered 2015-02-18 – 2015-02-20 (×5): 500 ug via ORAL
  Filled 2015-02-18 (×2): qty 1
  Filled 2015-02-18 (×3): qty 2
  Filled 2015-02-18: qty 1
  Filled 2015-02-18 (×2): qty 2

## 2015-02-18 MED ORDER — SODIUM CHLORIDE 0.9 % IV SOLN: 250.0000 mL | INTRAVENOUS | Status: DC | PRN

## 2015-02-18 MED ORDER — LEVOTHYROXINE SODIUM 50 MCG PO TABS
50.0000 ug | ORAL_TABLET | Freq: Every day | ORAL | Status: DC
  Administered 2015-02-19 – 2015-02-20 (×2): 50 ug via ORAL
  Filled 2015-02-18 (×2): qty 1

## 2015-02-18 MED ORDER — SODIUM CHLORIDE 0.9 % IJ SOLN
3.0000 mL | INTRAMUSCULAR | Status: DC | PRN
  Administered 2015-02-20: 3 mL via INTRAVENOUS
  Filled 2015-02-18: qty 10

## 2015-02-18 MED ORDER — RIVAROXABAN 20 MG PO TABS
20.0000 mg | ORAL_TABLET | Freq: Every day | ORAL | Status: DC
  Administered 2015-02-19: 20 mg via ORAL
  Filled 2015-02-18 (×2): qty 1

## 2015-02-18 MED ORDER — SODIUM CHLORIDE 0.9 % IJ SOLN
3.0000 mL | Freq: Two times a day (BID) | INTRAMUSCULAR | Status: DC
  Administered 2015-02-18: 3 mL via INTRAVENOUS

## 2015-02-18 MED ORDER — VITAMIN B-12 1000 MCG PO TABS
1000.0000 ug | ORAL_TABLET | Freq: Every day | ORAL | Status: DC
  Administered 2015-02-19 – 2015-02-20 (×2): 1000 ug via ORAL
  Filled 2015-02-18 (×2): qty 1

## 2015-02-18 NOTE — H&P (Signed)
Date of Service: 12/10/2014 Date of Birth: 06-06-1960 PCP: Janeann Forehand, MD  History of Present Illness: [name] is a 54 y.o.female patient who returns for follow-up visit. Was recently seen with an attempt at cardioversion. She has had 2 attempts to convert. Initial 1 was on the lower dose of flecainide. She had a flecainide increase in rehab attempted cardioversion. She did not convert. She is markedly obese. This is likely playing a role although she states she is compliant with her CPAP. We discussed consideration for redo try at cardioversion verses ablation. At her request will refer her to electrophysiology for consideration for further treatment options. Weight loss was recommended. Past Medical and Surgical History  Past Medical History Past Medical History  Diagnosis Date  . Thyroid disease  . Obesity  . Hyperlipidemia  . Arthritis  . Hypertension  . Atrial fibrillation  . Sleep apnea   Past Surgical History She has past surgical history that includes Abdominal hysterectomy.   Medications and Allergies  Current Medications  Current Outpatient Prescriptions  Medication Sig Dispense Refill  . albuterol 90 mcg/actuation inhaler Inhale 2 inhalations into the lungs every 6 (six) hours as needed for Wheezing.  Marland Kitchen aspirin 325 MG tablet Take 325 mg by mouth once daily.  . cyanocobalamin (VITAMIN B12) 1000 MCG tablet Take 1,000 mcg by mouth once daily.  Marland Kitchen diltiazem (CARDIZEM CD) 240 MG CD capsule Take 1 capsule (240 mg total) by mouth once daily. 90 capsule 3  . levothyroxine (SYNTHROID, LEVOTHROID) 25 MCG tablet Take 25 mcg by mouth once daily. Take on an empty stomach with a glass of water at least 30-60 minutes before breakfast.  . naproxen sodium (ALEVE, ANAPROX) 220 MG tablet Take 220 mg by mouth 2 (two) times daily as needed for Pain.  . rivaroxaban (XARELTO) 20 mg tablet Take 1 tablet (20 mg total) by mouth once daily. 30 tablet 11   No current facility-administered  medications for this visit.   Allergies: Review of patient's allergies indicates no known allergies.  Social and Family History  Social History reports that she has never smoked. She does not have any smokeless tobacco history on file. She reports that she drinks alcohol. She reports that she does not use illicit drugs.  Family History Family History  Problem Relation Age of Onset  . Heart disease Father  . Arthritis Mother  . Colon cancer Mother   Review of Systems  Review of Systems  Constitutional: Negative for fever, chills, weight loss, malaise/fatigue and diaphoresis.  HENT: Negative for congestion, ear discharge, hearing loss and tinnitus.  Eyes: Negative for blurred vision.  Respiratory: Positive for shortness of breath. Negative for cough, hemoptysis, sputum production and wheezing.  Cardiovascular: Negative for chest pain, orthopnea, claudication, leg swelling and PND.  Gastrointestinal: Negative for heartburn, nausea, vomiting, abdominal pain, diarrhea, constipation, blood in stool and melena.  Genitourinary: Negative for dysuria, urgency, frequency and hematuria.  Musculoskeletal: Negative for myalgias, back pain, joint pain and falls.  Skin: Negative for itching and rash.  Neurological: Negative for dizziness, tingling, focal weakness, loss of consciousness, weakness and headaches.  Endo/Heme/Allergies: Negative for polydipsia. Does not bruise/bleed easily.  Psychiatric/Behavioral: Negative for depression, memory loss and substance abuse. The patient is not nervous/anxious.    Physical Examination   Vitals:BP 120/78 mmHg  Pulse 80  Resp 12  Ht 160 cm ( )  Wt 153.316 kg (338 lb)  BMI 59.89 kg/m2 Ht:160 cm ( ) Wt:153.316 kg (338 lb) ZOX:WRUE surface  area is 2.61 meters squared. Body mass index is 59.89 kg/(m^2).  Wt Readings from Last 3 Encounters:  12/10/14 153.316 kg (338 lb)  11/18/14 153.316 kg (338 lb)  08/17/14 146.965 kg (324 lb)   BP Readings  from Last 3 Encounters:  12/10/14 120/78  11/18/14 120/72  08/17/14 130/70   General appearance appears in no acute distress  Head Mouth and Eye exam Normocephalic, without obvious abnormality, atraumatic Dentition is good Eyes appear anicteric   Neck exam Thyroid: normal  Nodes: no obvious adenopathy  LUNGS Breath Sounds: Normal Percussion: Normal  CARDIOVASCULAR JVP CV wave: no HJR: no Elevation at 90 degrees: None Carotid Pulse: normal pulsation bilaterally Bruit: None Apex: apical impulse normal  Auscultation Rhythm: atrial fibrillation S1: normal S2: normal Clicks: no Rub: no Murmurs: no murmurs  Gallop: None ABDOMEN Liver enlargement: no Pulsatile aorta: no Ascites: no Bruits: no  EXTREMITIES Clubbing: no Edema: trace to 1+ bilateral pedal edema Pulses: peripheral pulses symmetrical Femoral Bruits: no Amputation: no SKIN Rash: no Cyanosis: no Embolic phemonenon: no Bruising: no NEURO Alert and Oriented to person, place and time: yes Non focal: yes  PSYCH: Pt appears to have normal affect  LABS REVIEWED  Lab Results  Component Value Date  CREATININE 0.8 11/26/2014  BUN 15 11/26/2014  NA 140 11/26/2014  K 4.2 11/26/2014  CL 103 11/26/2014  CO2 29.7 11/26/2014   Assessment and Plan   54 y.o. female with  ICD-10-CM ICD-9-CM  1. Atrial fibrillation, unspecified type-persistent afib. Will continue xarelto and stop flecainide. Will load with tikosyn in hosptial and pt will be considered for cardioversion vs ablation.   These notes generated with voice recognition software. I apologize for typographical errors.  Denton ArKENNETH ALAN Kaari Zeigler, MD

## 2015-02-18 NOTE — Progress Notes (Signed)
   02/18/15 1400  Clinical Encounter Type  Visited With Patient and family together  Visit Type Initial  Referral From Nurse  Consult/Referral To Chaplain  Spiritual Encounters  Spiritual Needs Literature  Stress Factors  Patient Stress Factors Health changes  Met w/patient who requested Advance Directive information. Provided AD education and copies of literature. Patient will consider and plans to complete documents at home. Chap. Anyi Fels G. Danine Hor

## 2015-02-18 NOTE — Progress Notes (Signed)
Pharmacy Review for Dofetilide (Tikosyn) Initiation  Admit Complaint: 54 y.o. female admitted 02/18/2015 with atrial fibrillation to be initiated on dofetilide.   Assessment:  Patient Exclusion Criteria: If any screening criteria checked as "Yes", then  patient  should NOT receive dofetilide until criteria item is corrected. If "Yes" please indicate correction plan.  YES  NO Patient  Exclusion Criteria Correction Plan    Baseline QTc interval is greater than or equal to 440 msec. IF above YES box checked dofetilide contraindicated unless patient has ICD; then may proceed if QTc 500-550 msec or with known ventricular conduction abnormalities may proceed with QTc 550-600 msec. QTc =       Magnesium level is less than 1.8 mEq/l : Last magnesium:  Lab Results  Component Value Date   MG 1.9 02/18/2015           Potassium level is less than 4 mEq/l : Last potassium:  Lab Results  Component Value Date   K 4.0 02/18/2015           Patient is known or suspected to have a digoxin level greater than 2 ng/ml: No results found for: DIGOXIN      Creatinine clearance less than 20 ml/min (calculated using Cockcroft-Gault, actual body weight and serum creatinine): Estimated Creatinine Clearance: 113.9 mL/min (by C-G formula based on Cr of 0.81).      Patient has received drugs known to prolong the QT intervals within the last 48 hours (phenothiazines, tricyclics or tetracyclic antidepressants, erythromycin, H-1 antihistamines, cisapride, fluoroquinolones, azithromycin). Drugs not listed above may have an, as yet, undetected potential to prolong the QT interval, updated information on QT prolonging agents is available at this website:QT prolonging agents     Patient received a dose of hydrochlorothiazide (Oretic) alone or in any combination including triamterene (Dyazide, Maxzide) in the last 48 hours.     Patient received a medication known to increase  dofetilide plasma concentrations prior to initial dofetilide dose:  . Trimethoprim (Primsol, Proloprim) in the last 36 hours . Verapamil (Calan, Verelan) in the last 36 hours or a sustained release dose in the last 72 hours . Megestrol (Megace) in the last 5 days  . Cimetidine (Tagamet) in the last 6 hours . Ketoconazole (Nizoral) in the last 24 hours . Itraconazole (Sporanox) in the last 48 hours  . Prochlorperazine (Compazine) in the last 36 hours      Patient is known to have a history of torsades de pointes; congenital or acquired long QT syndromes.     Patient has received a Class 1 antiarrhythmic with less than 2 half-lives since last dose. (Disopyramide, Quinidine, Procainamide, Lidocaine, Mexiletine, Flecainide, Propafenone)     Patient has received amiodarone therapy in the past 3 months or amiodarone level is greater than 0.3 ng/ml.    Patient has been appropriately anticoagulated with rivaroxaban.  Patient was previously on flecanide, reports that she has not taken this medication since August.   Ordering provider was confirmed at [email or web address] if they are not listed on the [facility] Authorized Prescribers list.  Goal of Therapy: Follow renal function, electrolytes, potential drug interactions, and dose adjustment. Provide education and 1 week supply at discharge.  Plan:    Physician selected initial dose within range recommended for patients level of renal function - will monitor for response.    Physician selected initial dose outside of range recommended for patients level of renal function - will discuss if the dose should be altered at this time.  Select One Calculated CrCl  Dose q12h  [x]  > 60 ml/min 500 mcg  []  40-60 ml/min 250 mcg  []  20-40 ml/min 125 mcg   2. Follow up QTc after the first 5 doses, renal function, electrolytes (K & Mg) daily x 3 days, dose adjustment, success of initiation and facilitate 1 week discharge supply as clinically  indicated.  Wallice Granville C 10:13 AM 02/18/2015

## 2015-02-18 NOTE — Progress Notes (Signed)
Patient was a direct admit this AM, being followed by Dr. Lady GaryFath. Running Afib on tele. Heart rate initially this AM was around 120 while patient was moving around. Heart rate is currently int he 70's to 80's. Patient has been given the first dose of tikosyn. EKG was performed by respiratory three hours after. Pharmacy is following dosage and has stated okay to give dose this evening. Will pass on to night shift to put EKG order in for 2-3 hours after dose given. Patient has had no complaints so far. Will continue to monitor.

## 2015-02-19 LAB — BASIC METABOLIC PANEL
Anion gap: 8 (ref 5–15)
BUN: 13 mg/dL (ref 6–20)
CALCIUM: 8.8 mg/dL — AB (ref 8.9–10.3)
CHLORIDE: 105 mmol/L (ref 101–111)
CO2: 27 mmol/L (ref 22–32)
CREATININE: 0.8 mg/dL (ref 0.44–1.00)
Glucose, Bld: 113 mg/dL — ABNORMAL HIGH (ref 65–99)
Potassium: 3.6 mmol/L (ref 3.5–5.1)
SODIUM: 140 mmol/L (ref 135–145)

## 2015-02-19 LAB — MAGNESIUM: Magnesium: 2 mg/dL (ref 1.7–2.4)

## 2015-02-19 LAB — POTASSIUM
POTASSIUM: 3.7 mmol/L (ref 3.5–5.1)
POTASSIUM: 4.6 mmol/L (ref 3.5–5.1)

## 2015-02-19 LAB — CBC
HCT: 41.5 % (ref 35.0–47.0)
HEMOGLOBIN: 13.3 g/dL (ref 12.0–16.0)
MCH: 26.2 pg (ref 26.0–34.0)
MCHC: 32 g/dL (ref 32.0–36.0)
MCV: 81.8 fL (ref 80.0–100.0)
Platelets: 206 10*3/uL (ref 150–440)
RBC: 5.07 MIL/uL (ref 3.80–5.20)
RDW: 15.6 % — ABNORMAL HIGH (ref 11.5–14.5)
WBC: 8.8 10*3/uL (ref 3.6–11.0)

## 2015-02-19 MED ORDER — DOFETILIDE 500 MCG PO CAPS: 500.0000 ug | ORAL_CAPSULE | Freq: Two times a day (BID) | ORAL | Status: DC

## 2015-02-19 MED ORDER — POTASSIUM CHLORIDE CRYS ER 20 MEQ PO TBCR
20.0000 meq | EXTENDED_RELEASE_TABLET | Freq: Every day | ORAL | Status: DC
  Administered 2015-02-19 – 2015-02-20 (×2): 20 meq via ORAL
  Filled 2015-02-19 (×3): qty 1

## 2015-02-19 MED ORDER — POTASSIUM CHLORIDE CRYS ER 20 MEQ PO TBCR
40.0000 meq | EXTENDED_RELEASE_TABLET | Freq: Once | ORAL | Status: AC
  Administered 2015-02-19: 40 meq via ORAL
  Filled 2015-02-19: qty 2

## 2015-02-19 MED ORDER — POTASSIUM CHLORIDE CRYS ER 20 MEQ PO TBCR: 20.0000 meq | EXTENDED_RELEASE_TABLET | Freq: Every day | ORAL | Status: DC

## 2015-02-19 NOTE — Progress Notes (Signed)
[facility]  SUBJECTIVE: No complaints    Filed Vitals:   02/18/15 0914 02/18/15 1057 02/18/15 2116 02/19/15 0555  BP: 164/92 155/73 110/64 123/72  Pulse: 106 75 88 84  Temp: 98.3 F (36.8 C) 98.2 F (36.8 C) 98.2 F (36.8 C) 98.3 F (36.8 C)  TempSrc: Oral Oral Axillary Axillary  Resp: 19 22 20 19   Height: 5\' 2"  (1.575 m)     Weight: 152.136 kg (335 lb 6.4 oz)     SpO2: 97%  97% 91%    Intake/Output Summary (Last 24 hours) at 02/19/15 0759 Last data filed at 02/19/15 0550  Gross per 24 hour  Intake    480 ml  Output    600 ml  Net   -120 ml    LABS: Basic Metabolic Panel:  Recent Labs  75/64/3310/01/23 0929 02/19/15 0420  NA 142 140  K 4.0 3.6  CL 105 105  CO2 27 27  GLUCOSE 114* 113*  BUN 15 13  CREATININE 0.81 0.80  CALCIUM 9.5 8.8*  MG 1.9 2.0   Liver Function Tests: No results for input(s): AST, ALT, ALKPHOS, BILITOT, PROT, ALBUMIN in the last 72 hours. No results for input(s): LIPASE, AMYLASE in the last 72 hours. CBC: No results for input(s): WBC, NEUTROABS, HGB, HCT, MCV, PLT in the last 72 hours. Cardiac Enzymes: No results for input(s): CKTOTAL, CKMB, CKMBINDEX, TROPONINI in the last 72 hours. BNP: Invalid input(s): POCBNP D-Dimer: No results for input(s): DDIMER in the last 72 hours. Hemoglobin A1C: No results for input(s): HGBA1C in the last 72 hours. Fasting Lipid Panel: No results for input(s): CHOL, HDL, LDLCALC, TRIG, CHOLHDL, LDLDIRECT in the last 72 hours. Thyroid Function Tests: No results for input(s): TSH, T4TOTAL, T3FREE, THYROIDAB in the last 72 hours.  Invalid input(s): FREET3 Anemia Panel: No results for input(s): VITAMINB12, FOLATE, FERRITIN, TIBC, IRON, RETICCTPCT in the last 72 hours.   Physical Exam: Blood pressure 123/72, pulse 84, temperature 98.3 F (36.8 C), temperature source Axillary, resp. rate 19, height 5\' 2"  (1.575 m), weight 152.136 kg (335 lb 6.4 oz), SpO2 91 %.    General  appearance: alert and cooperative Resp: clear to auscultation bilaterally Cardio: irregularly irregular rhythm GI: soft, non-tender; bowel sounds normal; no masses,  no organomegaly Neurologic: Grossly normal  TELEMETRY: Reviewed telemetry pt in afib with variable rate.: EKG afib with qtc of 468 ASSESSMENT AND PLAN:  Active Problems:   A-fib (HCC)-loading with tikosyn. Afib with qtc of 468. K is 3.7. Will repleat to 4.0. Mg is 2.0.     Dalia HeadingFATH,Deicy Rusk A., MD, [facility] Hospital[facility]ACC 02/19/2015 7:59 AM

## 2015-02-19 NOTE — Discharge Summary (Signed)
Physician Discharge Summary  [name] MRN: [id number] DOB/AGE: 1961-04-10 54 y.o.  Admit date: 02/18/2015 Discharge date: 02/19/2015  Admission Diagnoses:  Discharge Diagnoses:  Active Problems:   A-fib [facility](HCC)   Discharged Condition: good  Hospital Course: Placed on telemetry while being loaded with dofetilide. Remains in afib with good rate control. Will need k greater than 4.0 and mg greater than 2.0. QTc not significantly prolonged. No ventricular arrhythmia  Consults: None  Significant Diagnostic Studies: labs: K 4.0; Mg 2.0  Treatments: none  Discharge Exam: Blood pressure 139/97, pulse 86, temperature 97.5 F (36.4 C), temperature source Oral, resp. rate 18, height 5\' 2"  (1.575 m), weight 152.136 kg (335 lb 6.4 oz), SpO2 94 %. General appearance: alert and cooperative Resp: clear to auscultation bilaterally Cardio: irregularly irregular rhythm GI: soft, non-tender; bowel sounds normal; no masses,  no organomegaly Extremities: extremities normal, atraumatic, no cyanosis or edema Neurologic: Grossly normal  Disposition: 01-Home or Self Care     Medication List    STOP taking these medications        diltiazem 240 MG 24 hr capsule  Commonly known as:  CARDIZEM CD      TAKE these medications        aspirin 81 MG tablet  Take 81 mg by mouth daily.     B-12 DOTS PO  Take by mouth.     dofetilide 500 MCG capsule  Commonly known as:  TIKOSYN  Take 1 capsule (500 mcg total) by mouth 2 (two) times daily.     levothyroxine 50 MCG tablet  Commonly known as:  SYNTHROID, LEVOTHROID  1 TABLET, ORAL, DAILY     oxycodone 5 MG capsule  Commonly known as:  OXY-IR  Take 5 mg by mouth. Every 4-6 hours as needed     potassium chloride SA 20 MEQ tablet  Commonly known as:  K-DUR,KLOR-CON  Take 1 tablet (20 mEq total) by mouth daily.     rivaroxaban 20 MG Tabs tablet  Commonly known as:  XARELTO  Take 20 mg by mouth daily with supper.     traMADol 50 MG tablet  Commonly known as:  ULTRAM  Take 50 mg by mouth. Every 4-6 hours as needed       Follow up next week with Dr. Lady GaryFath  Signed: Harold Scott,Bessye Stith A. 02/19/2015, 8:13 AM

## 2015-02-19 NOTE — Progress Notes (Signed)
Pharmacy Review for Dofetilide (Tikosyn) Follow UP   Admit Complaint: 54 y.o. female admitted 02/18/2015 with atrial fibrillation to be initiated on dofetilide.   Assessment:  Patient Exclusion Criteria: If any screening criteria checked as "Yes", then  patient  should NOT receive dofetilide until criteria item is corrected. If "Yes" please indicate correction plan.  YES  NO Patient  Exclusion Criteria Correction Plan    Baseline QTc interval is greater than or equal to 440 msec. IF above YES box checked dofetilide contraindicated unless patient has ICD; then may proceed if QTc 500-550 msec or with known ventricular conduction abnormalities may proceed with QTc 550-600 msec. QTc = 0.46     Magnesium level is less than 1.8 mEq/l : Last magnesium:  Lab Results  Component Value Date   MG 2.0 02/19/2015           Potassium level is less than 4 mEq/l : Last potassium:  Lab Results  Component Value Date   K 3.7 02/19/2015           Patient is known or suspected to have a digoxin level greater than 2 ng/ml: No results found for: DIGOXIN      Creatinine clearance less than 20 ml/min (calculated using Cockcroft-Gault, actual body weight and serum creatinine): Estimated Creatinine Clearance: 115.4 mL/min (by C-G formula based on Cr of 0.8).      Patient has received drugs known to prolong the QT intervals within the last 48 hours (phenothiazines, tricyclics or tetracyclic antidepressants, erythromycin, H-1 antihistamines, cisapride, fluoroquinolones, azithromycin). Drugs not listed above may have an, as yet, undetected potential to prolong the QT interval, updated information on QT prolonging agents is available at this website:QT prolonging agents     Patient received a dose of hydrochlorothiazide (Oretic) alone or in any combination including triamterene (Dyazide, Maxzide) in the last 48 hours.     Patient received a medication known to increase  dofetilide plasma concentrations prior to initial dofetilide dose:  . Trimethoprim (Primsol, Proloprim) in the last 36 hours . Verapamil (Calan, Verelan) in the last 36 hours or a sustained release dose in the last 72 hours . Megestrol (Megace) in the last 5 days  . Cimetidine (Tagamet) in the last 6 hours . Ketoconazole (Nizoral) in the last 24 hours . Itraconazole (Sporanox) in the last 48 hours  . Prochlorperazine (Compazine) in the last 36 hours      Patient is known to have a history of torsades de pointes; congenital or acquired long QT syndromes.     Patient has received a Class 1 antiarrhythmic with less than 2 half-lives since last dose. (Disopyramide, Quinidine, Procainamide, Lidocaine, Mexiletine, Flecainide, Propafenone)     Patient has received amiodarone therapy in the past 3 months or amiodarone level is greater than 0.3 ng/ml.    Patient has been appropriately anticoagulated with rivaroxaban.  Patient was previously on flecanide, reports that she has not taken this medication since August.   Ordering provider was confirmed at [email or web address] if they are not listed on the [facility] Authorized Prescribers list.  Goal of Therapy: Follow renal function, electrolytes, potential drug interactions, and dose adjustment. Provide education and 1 week supply at discharge.  Plan:    Physician selected initial dose within range recommended for patients level of renal function - will monitor for response.    Physician selected initial dose outside of range recommended for patients level of renal function - will discuss if the dose should be altered at this time.  Select One Calculated CrCl  Dose q12h  [x]  > 60 ml/min 500 mcg  []  40-60 ml/min 250 mcg  []  20-40 ml/min 125 mcg   2. Follow up QTc after the first 5 doses, renal function, electrolytes (K & Mg) daily x 3 days, dose adjustment, success of initiation and facilitate 1 week discharge supply as clinically  indicated.  QTc: 451 which is appropriate; however K is 3.7. Will give KCl 40 mEQ x 1 and  Will recheck K @ 18:00.  Need K of 4 or greater for Tikosyn    Texie Tupou D 3:22 PM 02/19/2015

## 2015-02-19 NOTE — Progress Notes (Signed)
Pharmacy Review for Dofetilide (Tikosyn) Follow UP   Admit Complaint: 54 y.o. female admitted 02/18/2015 with atrial fibrillation to be initiated on dofetilide.   Assessment:  Patient Exclusion Criteria: If any screening criteria checked as "Yes", then  patient  should NOT receive dofetilide until criteria item is corrected. If "Yes" please indicate correction plan.  YES  NO Patient  Exclusion Criteria Correction Plan    Baseline QTc interval is greater than or equal to 440 msec. IF above YES box checked dofetilide contraindicated unless patient has ICD; then may proceed if QTc 500-550 msec or with known ventricular conduction abnormalities may proceed with QTc 550-600 msec. QTc = 0.46     Magnesium level is less than 1.8 mEq/l : Last magnesium:  Lab Results  Component Value Date   MG 2.0 02/19/2015           Potassium level is less than 4 mEq/l : Last potassium:  Lab Results  Component Value Date   K 4.6 02/19/2015           Patient is known or suspected to have a digoxin level greater than 2 ng/ml: No results found for: DIGOXIN      Creatinine clearance less than 20 ml/min (calculated using Cockcroft-Gault, actual body weight and serum creatinine): Estimated Creatinine Clearance: 115.4 mL/min (by C-G formula based on Cr of 0.8).      Patient has received drugs known to prolong the QT intervals within the last 48 hours (phenothiazines, tricyclics or tetracyclic antidepressants, erythromycin, H-1 antihistamines, cisapride, fluoroquinolones, azithromycin). Drugs not listed above may have an, as yet, undetected potential to prolong the QT interval, updated information on QT prolonging agents is available at this website:QT prolonging agents     Patient received a dose of hydrochlorothiazide (Oretic) alone or in any combination including triamterene (Dyazide, Maxzide) in the last 48 hours.     Patient received a medication known to increase  dofetilide plasma concentrations prior to initial dofetilide dose:  . Trimethoprim (Primsol, Proloprim) in the last 36 hours . Verapamil (Calan, Verelan) in the last 36 hours or a sustained release dose in the last 72 hours . Megestrol (Megace) in the last 5 days  . Cimetidine (Tagamet) in the last 6 hours . Ketoconazole (Nizoral) in the last 24 hours . Itraconazole (Sporanox) in the last 48 hours  . Prochlorperazine (Compazine) in the last 36 hours      Patient is known to have a history of torsades de pointes; congenital or acquired long QT syndromes.     Patient has received a Class 1 antiarrhythmic with less than 2 half-lives since last dose. (Disopyramide, Quinidine, Procainamide, Lidocaine, Mexiletine, Flecainide, Propafenone)     Patient has received amiodarone therapy in the past 3 months or amiodarone level is greater than 0.3 ng/ml.    Patient has been appropriately anticoagulated with rivaroxaban.  Patient was previously on flecanide, reports that she has not taken this medication since August.   Ordering provider was confirmed at [email or web address] if they are not listed on the [facility] Authorized Prescribers list.  Goal of Therapy: Follow renal function, electrolytes, potential drug interactions, and dose adjustment. Provide education and 1 week supply at discharge.  Plan:    Physician selected initial dose within range recommended for patients level of renal function - will monitor for response.    Physician selected initial dose outside of range recommended for patients level of renal function - will discuss if the dose should be altered at this time.  Select One Calculated CrCl  Dose q12h  [x]  > 60 ml/min 500 mcg  []  40-60 ml/min 250 mcg  []  20-40 ml/min 125 mcg   2. Follow up QTc after the first 5 doses, renal function, electrolytes (K & Mg) daily x 3 days, dose adjustment, success of initiation and facilitate 1 week discharge supply as clinically  indicated.  QTc: 451 which is appropriate; however K is 3.7. Will give KCl 40 mEQ x 1 and  Will recheck K @ 18:00.  Need K of 4 or greater for Tikosyn   11/11:  K+  @ 17:43 = 4.6   Will continue this pt on current dose of tikosyn.    Dura Mccormack D 8:22 PM 02/19/2015

## 2015-02-20 LAB — BASIC METABOLIC PANEL
ANION GAP: 7 (ref 5–15)
BUN: 14 mg/dL (ref 6–20)
CALCIUM: 9 mg/dL (ref 8.9–10.3)
CO2: 27 mmol/L (ref 22–32)
Chloride: 106 mmol/L (ref 101–111)
Creatinine, Ser: 0.81 mg/dL (ref 0.44–1.00)
GFR calc Af Amer: >60 mL/min (ref >60)
GLUCOSE: 116 mg/dL — AB (ref 65–99)
Potassium: 4.1 mmol/L (ref 3.5–5.1)
Sodium: 140 mmol/L (ref 135–145)

## 2015-02-20 LAB — MAGNESIUM: Magnesium: 1.9 mg/dL (ref 1.7–2.4)

## 2015-02-20 NOTE — Progress Notes (Signed)
Pharmacy Review for Dofetilide (Tikosyn) Follow UP   Admit Complaint: 54 y.o. female admitted 02/18/2015 with atrial fibrillation to be initiated on dofetilide.   Assessment:  Patient Exclusion Criteria: If any screening criteria checked as "Yes", then  patient  should NOT receive dofetilide until criteria item is corrected. If "Yes" please indicate correction plan.  YES  NO Patient  Exclusion Criteria Correction Plan    Baseline QTc interval is greater than or equal to 440 msec. IF above YES box checked dofetilide contraindicated unless patient has ICD; then may proceed if QTc 500-550 msec or with known ventricular conduction abnormalities may proceed with QTc 550-600 msec. QTc = 0.46     Magnesium level is less than 1.8 mEq/l : Last magnesium:  Lab Results  Component Value Date   MG 1.9 02/20/2015           Potassium level is less than 4 mEq/l : Last potassium:  Lab Results  Component Value Date   K 4.1 02/20/2015           Patient is known or suspected to have a digoxin level greater than 2 ng/ml: No results found for: DIGOXIN      Creatinine clearance less than 20 ml/min (calculated using Cockcroft-Gault, actual body weight and serum creatinine): Estimated Creatinine Clearance: 113.9 mL/min (by C-G formula based on Cr of 0.81).      Patient has received drugs known to prolong the QT intervals within the last 48 hours (phenothiazines, tricyclics or tetracyclic antidepressants, erythromycin, H-1 antihistamines, cisapride, fluoroquinolones, azithromycin). Drugs not listed above may have an, as yet, undetected potential to prolong the QT interval, updated information on QT prolonging agents is available at this website:QT prolonging agents     Patient received a dose of hydrochlorothiazide (Oretic) alone or in any combination including triamterene (Dyazide, Maxzide) in the last 48 hours.     Patient received a medication known to increase  dofetilide plasma concentrations prior to initial dofetilide dose:  . Trimethoprim (Primsol, Proloprim) in the last 36 hours . Verapamil (Calan, Verelan) in the last 36 hours or a sustained release dose in the last 72 hours . Megestrol (Megace) in the last 5 days  . Cimetidine (Tagamet) in the last 6 hours . Ketoconazole (Nizoral) in the last 24 hours . Itraconazole (Sporanox) in the last 48 hours  . Prochlorperazine (Compazine) in the last 36 hours      Patient is known to have a history of torsades de pointes; congenital or acquired long QT syndromes.     Patient has received a Class 1 antiarrhythmic with less than 2 half-lives since last dose. (Disopyramide, Quinidine, Procainamide, Lidocaine, Mexiletine, Flecainide, Propafenone)     Patient has received amiodarone therapy in the past 3 months or amiodarone level is greater than 0.3 ng/ml.    Patient has been appropriately anticoagulated with rivaroxaban.  Patient was previously on flecanide, reports that she has not taken this medication since August.   Ordering provider was confirmed at [email or web address] if they are not listed on the [facility] Authorized Prescribers list.  Goal of Therapy: Follow renal function, electrolytes, potential drug interactions, and dose adjustment. Provide education and 1 week supply at discharge.  Plan:    Physician selected initial dose within range recommended for patients level of renal function - will monitor for response.    Physician selected initial dose outside of range recommended for patients level of renal function - will discuss if the dose should be altered at this time.  Select One Calculated CrCl  Dose q12h  [x]  > 60 ml/min 500 mcg  []  40-60 ml/min 250 mcg  []  20-40 ml/min 125 mcg   2. Follow up QTc after the first 5 doses, renal function, electrolytes (K & Mg) daily x 3 days, dose adjustment, success of initiation and facilitate 1 week discharge supply as clinically  indicated.  QTc: 451 which is appropriate; however K is 3.7. Will give KCl 40 mEQ x 1 and  Will recheck K @ 18:00.  Need K of 4 or greater for Tikosyn   11/11:  K+  @ 17:43 = 4.6   Will continue this pt on current dose of tikosyn.     11/12: K, Magnesium, and EKG all within range for continuation of tikosyn. Pharmacy will continue to follow.  Katherine Scott D 8:23 AM 02/20/2015

## 2015-02-20 NOTE — Progress Notes (Addendum)
Pt. Discharged to home via wc with her CPAP. Discharge instructions and medication regimen reviewed at bedside with patient. Pt. verbalizes understanding of instructions and medication regimen. Prescription for tikosyn given to patient and prescription for potassium has been called into pharmacy, pt aware.  Patient assessment unchanged from this morning. TELE and IV discontinued per policy.   1130 EKG WNL

## 2015-02-20 NOTE — Care Management Note (Signed)
Case Management Note  Patient Details  Name: Bertram DenverKay H Melnyk MRN: [id number] Date of Birth: Sep 30, 1960  Subjective/Objective:  No HH orders.                   Action/Plan:   Expected Discharge Date:                  Expected Discharge Plan:     In-House Referral:     Discharge planning Services     Post Acute Care Choice:    Choice offered to:     DME Arranged:    DME Agency:     HH Arranged:    HH Agency:     Status of Service:     Medicare Important Message Given:    Date Medicare IM Given:    Medicare IM give by:    Date Additional Medicare IM Given:    Additional Medicare Important Message give by:     If discussed at Long Length of Stay Meetings, dates discussed:    Additional Comments:  Kati Riggenbach A, RN 02/20/2015, 10:14 AM

## 2015-04-01 ENCOUNTER — Encounter: Payer: Self-pay | Admitting: *Deleted

## 2015-04-01 ENCOUNTER — Encounter
Admission: RE | Disposition: A | Discharge status: Home / Self Care | Payer: Self-pay | Source: Ambulatory Visit | Attending: Cardiology

## 2015-04-01 ENCOUNTER — Ambulatory Visit
Admission: RE | Admit: 2015-04-01 | Discharge: 2015-04-01 | Disposition: A | Discharge status: Home / Self Care | Payer: BLUE CROSS/BLUE SHIELD | Source: Ambulatory Visit | Attending: Cardiology | Admitting: Cardiology

## 2015-04-01 ENCOUNTER — Ambulatory Visit: Payer: BLUE CROSS/BLUE SHIELD | Admitting: Anesthesiology

## 2015-04-01 DIAGNOSIS — Z8249 Family history of ischemic heart disease and other diseases of the circulatory system: Secondary | ICD-10-CM | POA: Diagnosis not present

## 2015-04-01 DIAGNOSIS — Z79899 Other long term (current) drug therapy: Secondary | ICD-10-CM | POA: Insufficient documentation

## 2015-04-01 DIAGNOSIS — E785 Hyperlipidemia, unspecified: Secondary | ICD-10-CM | POA: Insufficient documentation

## 2015-04-01 DIAGNOSIS — Z6841 Body Mass Index (BMI) 40.0 and over, adult: Secondary | ICD-10-CM | POA: Diagnosis not present

## 2015-04-01 DIAGNOSIS — Z800 Family history of malignant neoplasm of digestive organs: Secondary | ICD-10-CM | POA: Insufficient documentation

## 2015-04-01 DIAGNOSIS — I10 Essential (primary) hypertension: Secondary | ICD-10-CM | POA: Diagnosis not present

## 2015-04-01 DIAGNOSIS — G4730 Sleep apnea, unspecified: Secondary | ICD-10-CM | POA: Diagnosis not present

## 2015-04-01 DIAGNOSIS — M1990 Unspecified osteoarthritis, unspecified site: Secondary | ICD-10-CM | POA: Diagnosis not present

## 2015-04-01 DIAGNOSIS — Z90710 Acquired absence of both cervix and uterus: Secondary | ICD-10-CM | POA: Insufficient documentation

## 2015-04-01 DIAGNOSIS — Z8261 Family history of arthritis: Secondary | ICD-10-CM | POA: Diagnosis not present

## 2015-04-01 DIAGNOSIS — Z7902 Long term (current) use of antithrombotics/antiplatelets: Secondary | ICD-10-CM | POA: Insufficient documentation

## 2015-04-01 DIAGNOSIS — E669 Obesity, unspecified: Secondary | ICD-10-CM | POA: Insufficient documentation

## 2015-04-01 DIAGNOSIS — E079 Disorder of thyroid, unspecified: Secondary | ICD-10-CM | POA: Insufficient documentation

## 2015-04-01 DIAGNOSIS — I480 Paroxysmal atrial fibrillation: Secondary | ICD-10-CM | POA: Insufficient documentation

## 2015-04-01 HISTORY — PX: ELECTROPHYSIOLOGIC STUDY: SHX172A

## 2015-04-01 SURGERY — CARDIOVERSION (CATH LAB)
Anesthesia: General

## 2015-04-01 MED ORDER — PROPOFOL 10 MG/ML IV BOLUS
INTRAVENOUS | Status: DC | PRN
  Administered 2015-04-01: 60 mg via INTRAVENOUS
  Administered 2015-04-01: 10 mg via INTRAVENOUS

## 2015-04-01 MED ORDER — FENTANYL CITRATE (PF) 100 MCG/2ML IJ SOLN: 25.0000 ug | INTRAMUSCULAR | Status: DC | PRN

## 2015-04-01 MED ORDER — SODIUM CHLORIDE 0.9 % IV SOLN
INTRAVENOUS | Status: DC
  Administered 2015-04-01: 07:00:00 via INTRAVENOUS

## 2015-04-01 MED ORDER — ONDANSETRON HCL 4 MG/2ML IJ SOLN: 4.0000 mg | Freq: Once | INTRAMUSCULAR | Status: DC | PRN

## 2015-04-01 NOTE — H&P (Signed)
Chief Complaint: Chief Complaint  Patient presents with  . 4 week follow up  afib//I am doing fine  Date of Service: 03/26/2015 Date of Birth: May 17, 1960 PCP: Janeann Forehand, MD  History of Present Illness: Katherine Scott is a 54 y.o.female patient who returns for follow-up visit. Underwent a dofetilide load. She is currently tolerating dofetilide at 500 mcg twice daily. She is tolerating this well. Electrocardiogram today reveals atrial fibrillation at a rate of 95 with a QRS duration of 68 milliseconds QTC of 477 milliseconds QRS axis 16. There is no ischemia. She is anticoagulated with Xarelto. Will attempt cardioversion again on dofetilide. Risk and benefits of this were discussed with the patient. Past Medical and Surgical History  Past Medical History Past Medical History  Diagnosis Date  . Arthritis  . Atrial fibrillation, unspecified  . Hyperlipidemia  . Hypertension  . Obesity  . Sleep apnea  . Thyroid disease   Past Surgical History She has a past surgical history that includes Abdominal hysterectomy; egd (04/08/2002); Colonoscopy (04/08/2002); and Colonoscopy (02/18/2010).   Medications and Allergies  Current Medications  Current Outpatient Prescriptions  Medication Sig Dispense Refill  . cyanocobalamin (VITAMIN B12) 1000 MCG tablet Take 1,000 mcg by mouth once daily.  Marland Kitchen diltiazem (CARDIZEM CD) 240 MG CD capsule Take 1 capsule (240 mg total) by mouth once daily. 90 capsule 3  . dofetilide (TIKOSYN) 500 MCG capsule Take by mouth.  . levothyroxine (SYNTHROID, LEVOTHROID) 25 MCG tablet Take 25 mcg by mouth once daily. Take on an empty stomach with a glass of water at least 30-60 minutes before breakfast.  . potassium chloride (K-DUR,KLOR-CON) 20 MEQ ER tablet Take by mouth. Take one tab daily  . rivaroxaban (XARELTO) 20 mg tablet Take 1 tablet (20 mg total) by mouth once daily. 30 tablet 11   No current facility-administered medications for this visit.   Allergies:  Review of patient's allergies indicates no known allergies.  Social and Family History  Social History reports that she has never smoked. She does not have any smokeless tobacco history on file. She reports that she drinks alcohol. She reports that she does not use illicit drugs.  Family History Family History  Problem Relation Age of Onset  . Arthritis Mother  . Colon cancer Mother  . Heart disease Father   Review of Systems  Review of Systems  Constitutional: Negative for chills, diaphoresis, fever, malaise/fatigue and weight loss.  HENT: Negative for congestion, ear discharge, hearing loss and tinnitus.  Eyes: Negative for blurred vision.  Respiratory: Positive for shortness of breath. Negative for cough, hemoptysis, sputum production and wheezing.  Cardiovascular: Negative for chest pain, orthopnea, claudication, leg swelling and PND.  Gastrointestinal: Negative for abdominal pain, blood in stool, constipation, diarrhea, heartburn, melena, nausea and vomiting.  Genitourinary: Negative for dysuria, frequency, hematuria and urgency.  Musculoskeletal: Negative for back pain, falls, joint pain and myalgias.  Skin: Negative for itching and rash.  Neurological: Negative for dizziness, tingling, focal weakness, loss of consciousness, weakness and headaches.  Endo/Heme/Allergies: Negative for polydipsia. Does not bruise/bleed easily.  Psychiatric/Behavioral: Negative for depression, memory loss and substance abuse. The patient is not nervous/anxious.    Physical Examination   Vitals: Visit Vitals  . BP 130/82 (BP Location: Left upper arm, Patient Position: Sitting, BP Cuff Size: Adult)  . Resp 12  . Ht 157.5 cm ( )  . Wt (!) 151 kg (333 lb)  . BMI 60.91 kg/m2   Ht:157.5 cm ( ) Wt:(!) 151  kg (333 lb) ZOX:WRUEBSA:Body surface area is 2.57 meters squared. Body mass index is 60.91 kg/(m^2).  Wt Readings from Last 3 Encounters:  03/26/15 (!) 151 kg (333 lb)  02/26/15 (!) 150.4 kg  (331 lb 9.6 oz)  01/08/15 (!) 151 kg (333 lb)   BP Readings from Last 3 Encounters:  03/26/15 130/82  02/26/15 128/80  01/08/15 128/82   General appearance appears in no acute distress  Head Mouth and Eye exam Normocephalic, without obvious abnormality, atraumatic Dentition is good Eyes appear anicteric   Neck exam Thyroid: normal  Nodes: no obvious adenopathy  LUNGS Breath Sounds: Normal Percussion: Normal  CARDIOVASCULAR JVP CV wave: no HJR: no Elevation at 90 degrees: None Carotid Pulse: normal pulsation bilaterally Bruit: None Apex: apical impulse normal  Auscultation Rhythm: atrial fibrillation S1: normal S2: normal Clicks: no Rub: no Murmurs: no murmurs  Gallop: None ABDOMEN Liver enlargement: no Pulsatile aorta: no Ascites: no Bruits: no  EXTREMITIES Clubbing: no Edema: trace to 1+ bilateral pedal edema Pulses: peripheral pulses symmetrical Femoral Bruits: no Amputation: no SKIN Rash: no Cyanosis: no Embolic phemonenon: no Bruising: no NEURO Alert and Oriented to person, place and time: yes Non focal: yes  PSYCH: Pt appears to have normal affect  LABS REVIEWED  Lab Results  Component Value Date  CREATININE 0.8 11/26/2014  BUN 15 11/26/2014  NA 140 11/26/2014  K 4.2 11/26/2014  CL 103 11/26/2014  CO2 29.7 11/26/2014   Assessment and Plan   54 y.o. female with  ICD-10-CM ICD-9-CM  1. Atrial fibrillation, unspecified type-atrial fibrillation. She is back on dofetilide after not taking a for several days due to cost issues. In discussion with electrophysiology, she is not a candidate for ablation at present. Recommendations are to continue with dofetilide and will attempt cardioversion. I48.91 427.31   2. Paroxysmal atrial fibrillation I48.0 427.31   Return in about 1 week (around 04/02/2015).  These notes generated with voice recognition software. I apologize for typographical errors.  Denton ArKENNETH ALAN Miho Monda, MD

## 2015-04-01 NOTE — Anesthesia Procedure Notes (Signed)
Performed by: Nekeya Briski Pre-anesthesia Checklist: Patient identified, Emergency Drugs available, Suction available, Patient being monitored and Timeout performed Patient Re-evaluated:Patient Re-evaluated prior to inductionOxygen Delivery Method: Nasal cannula Intubation Type: IV induction       

## 2015-04-01 NOTE — Anesthesia Preprocedure Evaluation (Signed)
Anesthesia Evaluation  Patient identified by MRN, date of birth, ID band Patient awake    Reviewed: Allergy & Precautions, NPO status , Patient's Chart, lab work & pertinent test results, reviewed documented beta blocker date and time   Airway Mallampati: II  TM Distance: >3 FB     Dental  (+) Chipped   Pulmonary    Pulmonary exam normal        Cardiovascular hypertension, Pt. on medications Normal cardiovascular exam+ dysrhythmias Atrial Fibrillation      Neuro/Psych    GI/Hepatic Neg liver ROS, Morbid obesity   Endo/Other  negative endocrine ROS  Renal/GU   negative genitourinary   Musculoskeletal negative musculoskeletal ROS (+)   Abdominal (+) + obese,   Peds negative pediatric ROS (+)  Hematology   Anesthesia Other Findings Morbid obesity  Reproductive/Obstetrics                             Anesthesia Physical  Anesthesia Plan  ASA: III  Anesthesia Plan: General   Post-op Pain Management:    Induction: Intravenous  Airway Management Planned: Nasal Cannula  Additional Equipment:   Intra-op Plan:   Post-operative Plan:   Informed Consent: I have reviewed the patients History and Physical, chart, labs and discussed the procedure including the risks, benefits and alternatives for the proposed anesthesia with the patient or authorized representative who has indicated his/her understanding and acceptance.     Plan Discussed with: CRNA  Anesthesia Plan Comments:         Anesthesia Quick Evaluation

## 2015-04-01 NOTE — Transfer of Care (Signed)
Immediate Anesthesia Transfer of Care Note  Patient: Katherine Scott  Procedure(s) Performed: Procedure(s): Cardioversion (N/A)  Patient Location: PACU  Anesthesia Type:General  Level of Consciousness: awake, alert  and oriented  Airway & Oxygen Therapy: Patient Spontanous Breathing and Patient connected to nasal cannula oxygen  Post-op Assessment: Report given to RN and Post -op Vital signs reviewed and stable  Post vital signs: Reviewed and stable  Last Vitals:  Filed Vitals:   04/01/15 0747 04/01/15 0748  BP:  149/79  Pulse: 86 85  Temp:    Resp: 12 11    Complications: No apparent anesthesia complications

## 2015-04-01 NOTE — Procedures (Signed)
Electrical Cardioversion Procedure Note Katherine Scott [id number] [birth year]  Procedure: Electrical Cardioversion Indications:  Atrial Fibrillation  Procedure Details Consent: Risks of procedure as well as the alternatives and risks of each were explained to the (patient/caregiver).  Consent for procedure obtained. Time Out: Verified patient identification, verified procedure, site/side was marked, verified correct patient position, special equipment/implants available, medications/allergies/relevent history reviewed, required imaging and test results available.  Performed  Patient placed on cardiac monitor, pulse oximetry, supplemental oxygen as necessary.  Sedation given: propofol Pacer pads placed anterior and posterior chest.  Cardioverted 2 time(s).  Cardioverted at 200J.  Evaluation Findings: Post procedure EKG shows: NSR Complications: None Patient did tolerate procedure well.   Katherine Scott. 04/01/2015, 8:00 AM

## 2015-04-01 NOTE — Discharge Instructions (Signed)
Electrical Cardioversion, Care After °Refer to this sheet in the next few weeks. These instructions provide you with information on caring for yourself after your procedure. Your health care provider may also give you more specific instructions. Your treatment has been planned according to current medical practices, but problems sometimes occur. Call your health care provider if you have any problems or questions after your procedure. °WHAT TO EXPECT AFTER THE PROCEDURE °After your procedure, it is typical to have the following sensations: °· Some redness on the skin where the shocks were delivered. If this is tender, a sunburn lotion or hydrocortisone cream may help. °· Possible return of an abnormal heart rhythm within hours or days after the procedure. °HOME CARE INSTRUCTIONS °· Take medicines only as directed by your health care provider. Be sure you understand how and when to take your medicine. °· Learn how to feel your pulse and check it often. °· Limit your activity for 48 hours after the procedure or as directed by your health care provider. °· Avoid or minimize caffeine and other stimulants as directed by your health care provider. °SEEK MEDICAL CARE IF: °· You feel like your heart is beating too fast or your pulse is not regular. °· You have any questions about your medicines. °· You have bleeding that will not stop. °SEEK IMMEDIATE MEDICAL CARE IF: °· You are dizzy or feel faint. °· It is hard to breathe or you feel short of breath. °· There is a change in discomfort in your chest. °· Your speech is slurred or you have trouble moving an arm or leg on one side of your body. °· You get a serious muscle cramp that does not go away. °· Your fingers or toes turn cold or blue. °  °This information is not intended to replace advice given to you by your health care provider. Make sure you discuss any questions you have with your health care provider. °  °Document Released: 01/15/2013 Document Revised: 04/17/2014  Document Reviewed: 01/15/2013 °Elsevier Interactive Patient Education ©2016 Elsevier Inc. ° °

## 2015-04-02 NOTE — Anesthesia Postprocedure Evaluation (Signed)
Anesthesia Post Note  Patient: Katherine Scott  Procedure(s) Performed: Procedure(s) (LRB): Cardioversion (N/A)  Patient location during evaluation: PACU Anesthesia Type: General Level of consciousness: awake and alert and oriented Pain management: pain level controlled Vital Signs Assessment: post-procedure vital signs reviewed and stable Respiratory status: spontaneous breathing Cardiovascular status: blood pressure returned to baseline Anesthetic complications: no    Last Vitals:  Filed Vitals:   04/01/15 0806 04/01/15 0810  BP: 124/78 119/74  Pulse: 90 85  Temp:    Resp: 25 16    Last Pain: There were no vitals filed for this visit.               Michele Judy

## 2015-05-19 ENCOUNTER — Other Ambulatory Visit: Payer: Self-pay | Admitting: Family Medicine

## 2015-07-27 DIAGNOSIS — M76821 Posterior tibial tendinitis, right leg: Secondary | ICD-10-CM | POA: Diagnosis not present

## 2015-07-27 DIAGNOSIS — J340 Abscess, furuncle and carbuncle of nose: Secondary | ICD-10-CM | POA: Diagnosis not present

## 2015-07-27 DIAGNOSIS — R040 Epistaxis: Secondary | ICD-10-CM | POA: Diagnosis not present

## 2015-07-30 DIAGNOSIS — R040 Epistaxis: Secondary | ICD-10-CM | POA: Diagnosis not present

## 2015-11-03 DIAGNOSIS — Z5181 Encounter for therapeutic drug level monitoring: Secondary | ICD-10-CM | POA: Diagnosis not present

## 2015-11-03 DIAGNOSIS — Z79899 Other long term (current) drug therapy: Secondary | ICD-10-CM | POA: Diagnosis not present

## 2015-11-03 DIAGNOSIS — I480 Paroxysmal atrial fibrillation: Secondary | ICD-10-CM | POA: Diagnosis not present

## 2015-11-03 DIAGNOSIS — G4733 Obstructive sleep apnea (adult) (pediatric): Secondary | ICD-10-CM | POA: Diagnosis not present

## 2015-11-03 DIAGNOSIS — I482 Chronic atrial fibrillation: Secondary | ICD-10-CM | POA: Diagnosis not present

## 2016-01-21 DIAGNOSIS — M25562 Pain in left knee: Secondary | ICD-10-CM | POA: Diagnosis not present

## 2016-06-21 DIAGNOSIS — R030 Elevated blood-pressure reading, without diagnosis of hypertension: Secondary | ICD-10-CM | POA: Diagnosis not present

## 2016-06-21 DIAGNOSIS — J209 Acute bronchitis, unspecified: Secondary | ICD-10-CM | POA: Diagnosis not present

## 2016-07-14 DIAGNOSIS — Z5181 Encounter for therapeutic drug level monitoring: Secondary | ICD-10-CM | POA: Diagnosis not present

## 2016-07-14 DIAGNOSIS — Z79899 Other long term (current) drug therapy: Secondary | ICD-10-CM | POA: Diagnosis not present

## 2016-08-01 DIAGNOSIS — E039 Hypothyroidism, unspecified: Secondary | ICD-10-CM | POA: Diagnosis not present

## 2016-08-01 DIAGNOSIS — I480 Paroxysmal atrial fibrillation: Secondary | ICD-10-CM | POA: Diagnosis not present

## 2016-08-01 DIAGNOSIS — I4891 Unspecified atrial fibrillation: Secondary | ICD-10-CM | POA: Diagnosis not present

## 2016-08-01 DIAGNOSIS — G4733 Obstructive sleep apnea (adult) (pediatric): Secondary | ICD-10-CM | POA: Diagnosis not present

## 2016-08-11 ENCOUNTER — Other Ambulatory Visit: Payer: Self-pay | Admitting: Nurse Practitioner

## 2016-08-11 ENCOUNTER — Ambulatory Visit (INDEPENDENT_AMBULATORY_CARE_PROVIDER_SITE_OTHER): Payer: BLUE CROSS/BLUE SHIELD | Admitting: Nurse Practitioner

## 2016-08-11 ENCOUNTER — Encounter: Payer: Self-pay | Admitting: Nurse Practitioner

## 2016-08-11 VITALS — BP 125/81 | HR 93 | Temp 97.9°F | Resp 16 | Ht 62.0 in | Wt 351.5 lb

## 2016-08-11 DIAGNOSIS — E039 Hypothyroidism, unspecified: Secondary | ICD-10-CM

## 2016-08-11 DIAGNOSIS — Z6841 Body Mass Index (BMI) 40.0 and over, adult: Secondary | ICD-10-CM

## 2016-08-11 MED ORDER — LEVOTHYROXINE SODIUM 75 MCG PO TABS: 75.0000 ug | ORAL_TABLET | Freq: Every day | ORAL | 2 refills | Status: DC

## 2016-08-11 NOTE — Patient Instructions (Signed)
Katherine Scott, Thank you for coming in to clinic today.  1. You have hypothyroidism.  This is an underactive thyroid gland which does not produce enough thyroid hormone.  2. START levothyroxine 75 mcg once daily first thing in the morning about 1 hour before breakfast.  Please schedule a follow-up appointment with Katherine McardleLauren Saksham Scott, AGNP in 3 months for thyroid check or annual physical.  If you have any other questions or concerns, please feel free to call the clinic or send a message through MyChart. You may also schedule an earlier appointment if necessary.  Katherine McardleLauren Katherine Wiswell, DNP, AGNP-BC Adult Gerontology Nurse Practitioner [facility]   Levothyroxine tablets What is this medicine? LEVOTHYROXINE (lee voe thye ROX een) is a thyroid hormone. This medicine can improve symptoms of thyroid deficiency such as slow speech, lack of energy, weight gain, hair loss, dry skin, and feeling cold. It also helps to treat goiter (an enlarged thyroid gland). It is also used to treat some kinds of thyroid cancer along with surgery and other medicines. This medicine may be used for other purposes; ask your health care provider or pharmacist if you have questions. COMMON BRAND NAME(S): Estre, Levo-T, Levothroid, Levoxyl, Synthroid, Thyro-Tabs, Unithroid What should I tell my health care provider before I take this medicine? They need to know if you have any of these conditions: -angina -blood clotting problems -diabetes -dieting or on a weight loss program -fertility problems -heart disease -high levels of thyroid hormone -pituitary gland problem -previous heart attack -an unusual or allergic reaction to levothyroxine, thyroid hormones, other medicines, foods, dyes, or preservatives -pregnant or trying to get pregnant -breast-feeding How should I use this medicine? Take this medicine by mouth with plenty of water. It is best to take on an empty stomach, at least 30 minutes before or 2 hours  after food. Follow the directions on the prescription label. Take at the same time each day. Do not take your medicine more often than directed. Contact your pediatrician regarding the use of this medicine in children. While this drug may be prescribed for children and infants as young as a few days of age for selected conditions, precautions do apply. For infants, you may crush the tablet and place in a small amount of (5-10 ml or 1 to 2 teaspoonfuls) of water, breast milk, or non-soy based infant formula. Do not mix with soy-based infant formula. Give as directed. Overdosage: If you think you have taken too much of this medicine contact a poison control center or emergency room at once. NOTE: This medicine is only for you. Do not share this medicine with others. What if I miss a dose? If you miss a dose, take it as soon as you can. If it is almost time for your next dose, take only that dose. Do not take double or extra doses. What may interact with this medicine? -amiodarone -antacids -anti-thyroid medicines -calcium supplements -carbamazepine -cholestyramine -colestipol -digoxin -female hormones, including contraceptive or birth control pills -iron supplements -ketamine -liquid nutrition products like Ensure -medicines for colds and breathing difficulties -medicines for diabetes -medicines for mental depression -medicines or herbals used to decrease weight or appetite -phenobarbital or other barbiturate medications -phenytoin -prednisone or other corticosteroids -rifabutin -rifampin -soy isoflavones -sucralfate -theophylline -warfarin This list may not describe all possible interactions. Give your health care provider a list of all the medicines, herbs, non-prescription drugs, or dietary supplements you use. Also tell them if you smoke, drink alcohol, or use illegal drugs. Some items  may interact with your medicine. What should I watch for while using this medicine? Be sure to  take this medicine with plenty of fluids. Some tablets may cause choking, gagging, or difficulty swallowing from the tablet getting stuck in your throat. Most of these problems disappear if the medicine is taken with the right amount of water or other fluids. Do not switch brands of this medicine unless your health care professional agrees with the change. Ask questions if you are uncertain. You will need regular exams and occasional blood tests to check the response to treatment. If you are receiving this medicine for an underactive thyroid, it may be several weeks before you notice an improvement. Check with your doctor or health care professional if your symptoms do not improve. It may be necessary for you to take this medicine for the rest of your life. Do not stop using this medicine unless your doctor or health care professional advises you to. This medicine can affect blood sugar levels. If you have diabetes, check your blood sugar as directed. You may lose some of your hair when you first start treatment. With time, this usually corrects itself. If you are going to have surgery, tell your doctor or health care professional that you are taking this medicine. What side effects may I notice from receiving this medicine? Side effects that you should report to your doctor or health care professional as soon as possible: -allergic reactions like skin rash, itching or hives, swelling of the face, lips, or tongue -chest pain -excessive sweating or intolerance to heat -fast or irregular heartbeat -nervousness -skin rash or hives -swelling of ankles, feet, or legs -tremors Side effects that usually do not require medical attention (report to your doctor or health care professional if they continue or are bothersome): -changes in appetite -changes in menstrual periods -diarrhea -hair loss -headache -trouble sleeping -weight loss This list may not describe all possible side effects. Call your  doctor for medical advice about side effects. You may report side effects to FDA at 1-800-FDA-1088. Where should I keep my medicine? Keep out of the reach of children. Store at room temperature between 15 and 30 degrees C (59 and 86 degrees F). Protect from light and moisture. Keep container tightly closed. Throw away any unused medicine after the expiration date. NOTE: This sheet is a summary. It may not cover all possible information. If you have questions about this medicine, talk to your doctor, pharmacist, or health care provider.  2018 Elsevier/Gold Standard (2008-07-03 14:28:07)    Hypothyroidism Hypothyroidism is a disorder of the thyroid. The thyroid is a large gland that is located in the lower front of the neck. The thyroid releases hormones that control how the body works. With hypothyroidism, the thyroid does not make enough of these hormones. What are the causes? Causes of hypothyroidism may include:  Viral infections.  Pregnancy.  Your own defense system (immune system) attacking your thyroid.  Certain medicines.  Birth defects.  Past radiation treatments to your head or neck.  Past treatment with radioactive iodine.  Past surgical removal of part or all of your thyroid.  Problems with the gland that is located in the center of your brain (pituitary). What are the signs or symptoms? Signs and symptoms of hypothyroidism may include:  Feeling as though you have no energy (lethargy).  Inability to tolerate cold.  Weight gain that is not explained by a change in diet or exercise habits.  Dry skin.  Coarse hair.  Menstrual irregularity.  Slowing of thought processes.  Constipation.  Sadness or depression. How is this diagnosed? Your health care provider may diagnose hypothyroidism with blood tests and ultrasound tests. How is this treated? Hypothyroidism is treated with medicine that replaces the hormones that your body does not make. After you begin  treatment, it may take several weeks for symptoms to go away. Follow these instructions at home:  Take medicines only as directed by your health care provider.  If you start taking any new medicines, tell your health care provider.  Keep all follow-up visits as directed by your health care provider. This is important. As your condition improves, your dosage needs may change. You will need to have blood tests regularly so that your health care provider can watch your condition. Contact a health care provider if:  Your symptoms do not get better with treatment.  You are taking thyroid replacement medicine and:  You sweat excessively.  You have tremors.  You feel anxious.  You lose weight rapidly.  You cannot tolerate heat.  You have emotional swings.  You have diarrhea.  You feel weak. Get help right away if:  You develop chest pain.  You develop an irregular heartbeat.  You develop a rapid heartbeat. This information is not intended to replace advice given to you by your health care provider. Make sure you discuss any questions you have with your health care provider. Document Released: 03/27/2005 Document Revised: 09/02/2015 Document Reviewed: 08/12/2013 Elsevier Interactive Patient Education  2017 [employer].

## 2016-08-11 NOTE — Progress Notes (Signed)
I have reviewed this encounter including the documentation in this note and/or discussed this patient with the provider, Wilhelmina McardleLauren Kennedy, AGPCNP-BC. I am certifying that I agree with the content of this note as supervising physician.  Saralyn PilarAlexander Karamalegos, DO [facility] 08/11/2016, 11:17 PM

## 2016-08-11 NOTE — Progress Notes (Signed)
Subjective:    [name], female    DOB: 08/14/1960, 56 y.o.   MRN: [id number]  Katherine Scott is a 56 y.o. female presenting on 08/11/2016 for Follow-up (Patient here today to follow-up on chronic condition. Patient is requesting refill on levothyroxine.)   HPI  Hypothyroidism [place] has been managed on generic levothyroxine 50 mcg daily.  Takes at night around 9 pm which is 2-3 hours after eating and with another medication.  She changed to nightly dosing because she had been told she couldn't take any vitamins with her levothyroxine.  She is only taking a vitamin B 12 supplement.   Her hypothyroidism was found with a high TSH at physical in 2013.  After this physical she had knee replacement surgery and had a new onset of a fib.  She converted to NSR after cardioversion and maintained NSR until 2016.  It "took a little while to convert" and she stayed in NSR for about 1.5 years until April 2018.  She has been restarted on her Xarelto by her Cardiologist Dr. Ubaldo Glassing and is planning for another cardioversion once anticoagulated.  He also checked her TSH and found it to be elevated.  Her last TSH = 7.052 was 08/01/2016.  She ran out of her levothyroxine and hasn't had any for about 2 weeks.  Her nails have been brittle for years without any reductions during normothyroid periods.  She has had some fatigue and worsening lower leg swelling, but attributes these partially to her afib.  She has had some recent weight gain, but attributes this to changes in cooking habits for one person since her daughter has gone to college.  She denies heart racing, heat and cold intolerance, changes in hair/skin/nails, constipation, diarrhea.  She does not have any compressive symptoms to include difficulty swallowing, globus sensation, or difficulty breathing when lying flat.  Weight Gain/BMI 64.4 Pt has noted she has gained weight since last august when her daughter moved to college.  She is struggling with how  to cook for one person.  She has eaten more unhealthy foods and more meals out to eat. She did not want to elaborate much more about this and resisted answering other questions.  She did state her daughter was encouraging them both to eat healthier and start losing weight when she comes home for the summer in a few days to 2 weeks.   Social History  Substance Use Topics  . Smoking status: Never Smoker  . Smokeless tobacco: Never Used  . Alcohol use No    Review of Systems Per HPI unless specifically indicated above     Objective:    BP 125/81 (BP Location: Left Arm, Patient Position: Sitting, Cuff Size: Large)   Pulse 93   Temp 97.9 F (36.6 C) (Oral)   Resp 16   Ht '5\' 2"'  (1.575 m)   Wt (!) 351 lb 8 oz (159.4 kg)   SpO2 98%   BMI 64.29 kg/m   Wt Readings from Last 3 Encounters:  08/11/16 (!) 351 lb 8 oz (159.4 kg)  04/01/15 (!) 331 lb (150.1 kg)  02/18/15 (!) 335 lb 6.4 oz (152.1 kg)    Physical Exam  Constitutional: She is oriented to person, place, and time. She appears well-developed. No distress.  morbidly obese  HENT:  Head: Normocephalic and atraumatic.  Eyes: Conjunctivae and EOM are normal. Pupils are equal, round, and reactive to light.  Neck: Normal range of motion. Neck supple. No JVD  present. No tracheal deviation present. No thyromegaly present.  Cardiovascular: Normal rate, normal heart sounds and intact distal pulses.  An irregularly irregular rhythm present.  Known afib.  Currently rate controlled. +2 non-pitting edema bilateral LE  Pulmonary/Chest: Breath sounds normal. No respiratory distress. She has no wheezes. She has no rales.  Abdominal: Bowel sounds are normal.  No hepatosplenomegaly on scratch test  Lymphadenopathy:    She has no cervical adenopathy.  Neurological: She is alert and oriented to person, place, and time. She has normal reflexes.  Skin: Skin is warm and dry.  Psychiatric: She has a normal mood and affect. Her behavior is normal.  Judgment and thought content normal.   Recent labs from [facility]: 08/01/2016 TSH: 7.052  07/14/2016 Glu: 110 Na: 142 K: 4.6 Cl: 14 CO2: 31.8 Ca: 9.7 BUN: 18 Cr: 1.0 eGFR: 58 BUN/Cr ratio: 18.0 Mag: 2.0      Assessment & Plan:   Problem List Items Addressed This Visit      Endocrine   Acquired hypothyroidism - Primary High TSH on medication with recent change in heart rhythm back to afib.  Possible correlation to afib and recent weight gain.  Plan: 1. Increase levothyroxine to 75 mcg once daily before breakfast. 2. Discussed timing of dose.  Pt willing to change to am before food and other medications.  No known interaction with vitamin B12 supplementation within 3 hours for absorption of levothyroxine.  Discussed importance of this dosing for multivitamin or for supplements of calcium, phosphorous, or iron. 3. Recheck TSH 6 weeks after initiating new dose levothyroxine.    Relevant Medications   levothyroxine (SYNTHROID, LEVOTHROID) 75 MCG tablet  BMI 60.0-69.9, adult (HCC) Uncontrolled with recent weight gain.  Patient attributes this to changes in diet and cooking styles.  Daughter returning home for summer and requests that her mom help cook healthy again to lose weight.  Patient also ready to start, but with support and help from daughter.  Plan: 1. Discussed weight management. Pt willing to start trying to eat better.  Will discuss further at a later date if patient requests assistance.  Not ready at this time for medical management or further discussions.        Meds ordered this encounter  Medications  . dofetilide (TIKOSYN) 500 MCG capsule    Sig: Take 1,000 mcg by mouth daily.  Marland Kitchen diltiazem (CARDIZEM CD) 240 MG 24 hr capsule    Sig: TAKE 1 CAPSULE (240 MG TOTAL) BY MOUTH ONCE DAILY.  . naproxen sodium (ANAPROX) 220 MG tablet    Sig: Take by mouth.     Follow up plan: Return in about 3 months (around 11/11/2016) for annual physical and 6  weeks for labs only.   Cassell Smiles, DNP, AGPCNP-BC Adult Gerontology Primary Care Nurse Practitioner [facility] 08/11/2016, 8:26 AM

## 2016-08-29 DIAGNOSIS — G4733 Obstructive sleep apnea (adult) (pediatric): Secondary | ICD-10-CM | POA: Diagnosis not present

## 2016-08-29 DIAGNOSIS — I4891 Unspecified atrial fibrillation: Secondary | ICD-10-CM | POA: Diagnosis not present

## 2016-08-29 DIAGNOSIS — Z6841 Body Mass Index (BMI) 40.0 and over, adult: Secondary | ICD-10-CM | POA: Diagnosis not present

## 2016-08-29 DIAGNOSIS — I480 Paroxysmal atrial fibrillation: Secondary | ICD-10-CM | POA: Diagnosis not present

## 2016-09-08 ENCOUNTER — Ambulatory Visit: Admit: 2016-09-08 | Payer: BLUE CROSS/BLUE SHIELD | Admitting: Cardiology

## 2016-09-08 SURGERY — CARDIOVERSION (CATH LAB)
Anesthesia: General

## 2016-09-13 ENCOUNTER — Other Ambulatory Visit: Payer: Self-pay | Admitting: Cardiology

## 2016-09-21 ENCOUNTER — Other Ambulatory Visit: Payer: Self-pay | Admitting: Cardiology

## 2016-09-21 DIAGNOSIS — I480 Paroxysmal atrial fibrillation: Secondary | ICD-10-CM

## 2016-09-22 ENCOUNTER — Encounter
Admission: RE | Disposition: A | Discharge status: Home / Self Care | Payer: Self-pay | Source: Ambulatory Visit | Attending: Cardiology

## 2016-09-22 ENCOUNTER — Ambulatory Visit: Payer: BLUE CROSS/BLUE SHIELD | Admitting: Anesthesiology

## 2016-09-22 ENCOUNTER — Encounter: Payer: Self-pay | Admitting: *Deleted

## 2016-09-22 ENCOUNTER — Ambulatory Visit
Admission: RE | Admit: 2016-09-22 | Discharge: 2016-09-22 | Disposition: A | Discharge status: Home / Self Care | Payer: BLUE CROSS/BLUE SHIELD | Source: Ambulatory Visit | Attending: Cardiology | Admitting: Cardiology

## 2016-09-22 DIAGNOSIS — Z6841 Body Mass Index (BMI) 40.0 and over, adult: Secondary | ICD-10-CM | POA: Diagnosis not present

## 2016-09-22 DIAGNOSIS — E785 Hyperlipidemia, unspecified: Secondary | ICD-10-CM | POA: Diagnosis not present

## 2016-09-22 DIAGNOSIS — E079 Disorder of thyroid, unspecified: Secondary | ICD-10-CM | POA: Insufficient documentation

## 2016-09-22 DIAGNOSIS — I10 Essential (primary) hypertension: Secondary | ICD-10-CM | POA: Diagnosis not present

## 2016-09-22 DIAGNOSIS — I4891 Unspecified atrial fibrillation: Secondary | ICD-10-CM | POA: Diagnosis not present

## 2016-09-22 DIAGNOSIS — G4730 Sleep apnea, unspecified: Secondary | ICD-10-CM | POA: Diagnosis not present

## 2016-09-22 DIAGNOSIS — Z7901 Long term (current) use of anticoagulants: Secondary | ICD-10-CM | POA: Insufficient documentation

## 2016-09-22 DIAGNOSIS — I480 Paroxysmal atrial fibrillation: Secondary | ICD-10-CM | POA: Diagnosis not present

## 2016-09-22 DIAGNOSIS — E039 Hypothyroidism, unspecified: Secondary | ICD-10-CM | POA: Diagnosis not present

## 2016-09-22 HISTORY — PX: CARDIOVERSION: EP1203

## 2016-09-22 SURGERY — CARDIOVERSION (CATH LAB)
Anesthesia: Choice

## 2016-09-22 SURGERY — CARDIOVERSION (CATH LAB)
Anesthesia: General

## 2016-09-22 MED ORDER — PROPOFOL 500 MG/50ML IV EMUL
INTRAVENOUS | Status: AC
  Filled 2016-09-22: qty 50

## 2016-09-22 MED ORDER — HYDROCORTISONE 1 % EX CREA
1.0000 "application " | TOPICAL_CREAM | Freq: Three times a day (TID) | CUTANEOUS | Status: DC | PRN
  Filled 2016-09-22: qty 28

## 2016-09-22 MED ORDER — FENTANYL CITRATE (PF) 100 MCG/2ML IJ SOLN: 25.0000 ug | INTRAMUSCULAR | Status: DC | PRN

## 2016-09-22 MED ORDER — SODIUM CHLORIDE 0.9 % IV SOLN
250.0000 mL | INTRAVENOUS | Status: DC
  Administered 2016-09-22: 08:00:00 via INTRAVENOUS

## 2016-09-22 MED ORDER — ONDANSETRON HCL 4 MG/2ML IJ SOLN: 4.0000 mg | Freq: Once | INTRAMUSCULAR | Status: DC | PRN

## 2016-09-22 MED ORDER — SODIUM CHLORIDE 0.9% FLUSH
3.0000 mL | Freq: Two times a day (BID) | INTRAVENOUS | Status: DC
Start: 2016-09-22 — End: 2016-09-22

## 2016-09-22 MED ORDER — PROPOFOL 10 MG/ML IV BOLUS
INTRAVENOUS | Status: DC | PRN
  Administered 2016-09-22: 50 mg via INTRAVENOUS
  Administered 2016-09-22: 40 mg via INTRAVENOUS

## 2016-09-22 MED ORDER — SODIUM CHLORIDE 0.9% FLUSH: 3.0000 mL | INTRAVENOUS | Status: DC | PRN

## 2016-09-22 NOTE — Anesthesia Post-op Follow-up Note (Cosign Needed)
Anesthesia QCDR form completed.        

## 2016-09-22 NOTE — Transfer of Care (Signed)
Immediate Anesthesia Transfer of Care Note  Patient: Katherine Scott  Procedure(s) Performed: Procedure(s): CARDIOVERSION (N/A)  Patient Location: PACU and Nursing Unit  Anesthesia Type:General  Level of Consciousness: awake, alert  and oriented  Airway & Oxygen Therapy: Patient Spontanous Breathing and Patient connected to nasal cannula oxygen  Post-op Assessment: Report given to RN and Post -op Vital signs reviewed and stable  Post vital signs: Reviewed and stable  Last Vitals:  Vitals:   09/22/16 0742 09/22/16 0743  BP: 133/85 133/85  Pulse: 67 69  Resp: 19 18  Temp:      Last Pain:  Vitals:   09/22/16 0645  TempSrc: Oral  PainSc: 6       Patients Stated Pain Goal: 0 (09/22/16 0645)  Complications: No apparent anesthesia complications

## 2016-09-22 NOTE — Procedures (Signed)
Electrical Cardioversion Procedure Note Katherine Scott [id number] Jul 25, 1960  Procedure: Electrical Cardioversion Indications:  Atrial Fibrillation  Procedure Details Consent: Risks of procedure as well as the alternatives and risks of each were explained to the (patient/caregiver).  Consent for procedure obtained. Time Out: Verified patient identification, verified procedure, site/side was marked, verified correct patient position, special equipment/implants available, medications/allergies/relevent history reviewed, required imaging and test results available.  Performed  Patient placed on cardiac monitor, pulse oximetry, supplemental oxygen as necessary.  Sedation given: propofol Pacer pads placed anterior and posterior chest.  Cardioverted 1 time(s).  Cardioverted at 120J.  Evaluation Findings: Post procedure EKG shows: NSR Complications: None Patient did tolerate procedure well.   Katherine Scott 09/22/2016, 7:46 AM

## 2016-09-22 NOTE — Anesthesia Preprocedure Evaluation (Signed)
Anesthesia Evaluation  Patient identified by MRN, date of birth, ID band Patient awake    Reviewed: Allergy & Precautions, NPO status , Patient's Chart, lab work & pertinent test results, reviewed documented beta blocker date and time   Airway Mallampati: II  TM Distance: >3 FB     Dental  (+) Chipped   Pulmonary    Pulmonary exam normal        Cardiovascular hypertension, Pt. on medications Normal cardiovascular exam+ dysrhythmias Atrial Fibrillation      Neuro/Psych    GI/Hepatic Neg liver ROS, Morbid obesity   Endo/Other  negative endocrine ROSHypothyroidism   Renal/GU   negative genitourinary   Musculoskeletal negative musculoskeletal ROS (+)   Abdominal (+) + obese,   Peds negative pediatric ROS (+)  Hematology   Anesthesia Other Findings Morbid obesity  Reproductive/Obstetrics                             Anesthesia Physical  Anesthesia Plan  ASA: III  Anesthesia Plan: General   Post-op Pain Management:    Induction: Intravenous  PONV Risk Score and Plan:   Airway Management Planned: Nasal Cannula  Additional Equipment:   Intra-op Plan:   Post-operative Plan:   Informed Consent: I have reviewed the patients History and Physical, chart, labs and discussed the procedure including the risks, benefits and alternatives for the proposed anesthesia with the patient or authorized representative who has indicated his/her understanding and acceptance.     Plan Discussed with: CRNA  Anesthesia Plan Comments:         Anesthesia Quick Evaluation

## 2016-09-22 NOTE — Anesthesia Postprocedure Evaluation (Signed)
Anesthesia Post Note  Patient: Katherine Scott  Procedure(s) Performed: Procedure(s) (LRB): CARDIOVERSION (N/A)  Patient location during evaluation: Cath Lab (holding area) Anesthesia Type: General Level of consciousness: awake and alert and oriented Pain management: pain level controlled Vital Signs Assessment: post-procedure vital signs reviewed and stable Respiratory status: spontaneous breathing Cardiovascular status: stable Anesthetic complications: no     Last Vitals:  Vitals:   09/22/16 0742 09/22/16 0743  BP: 133/85 133/85  Pulse: 67 69  Resp: 19 18  Temp:      Last Pain:  Vitals:   09/22/16 0645  TempSrc: Oral  PainSc: 6                  Zachary GeorgeWeatherly,  Desere Gwin F

## 2016-09-22 NOTE — Addendum Note (Signed)
Addendum  created 09/22/16 0752 by Omer JackWeatherly, Aalaysia Liggins, CRNA   Anesthesia Intra Meds edited

## 2016-09-24 ENCOUNTER — Encounter: Payer: Self-pay | Admitting: Cardiology

## 2016-09-24 NOTE — H&P (Signed)
Chief Complaint: Chief Complaint  Patient presents with  . Follow-up  4 weeks --restarted Xarelto ? cardioversion  . Shortness of Breath  I still have the shortness of breath  Date of Service: 08/29/2016 Date of Birth: 1960/12/08 PCP: Janeann Forehand, MD  History of Present Illness: [name] is a 56 y.o.female patient who returns for follow-up visit. Patient remains in recurrent atrial fibrillation. She has been compliant with her dofetilide but has continued to gain weight. She states she is compliant with her CPAP. EKG today confirms atrial fibrillation at a rate of 69 bpm with a QRS duration of 76 ms, QTC of 445 ms with a QRS axis of 32. There is no change from baseline. Patient has been back on Xarelto for 4 weeks. We discussed risks and benefits of cardioversion patient would like to proceed. Likelihood of success is limited due to her continued weight gain however will make an attempt.  Past Medical and Surgical History  Past Medical History Past Medical History:  Diagnosis Date  . Arthritis  . Atrial fibrillation , unspecified (CMS-HCC)  . Hyperlipidemia, unspecified  . Hypertension  . Obesity, unspecified  . Sleep apnea  . Thyroid disease   Past Surgical History She has a past surgical history that includes Abdominal hysterectomy; egd (04/08/2002); Colonoscopy (04/08/2002); and Colonoscopy (02/18/2010).   Medications and Allergies  Current Medications  Current Outpatient Prescriptions  Medication Sig Dispense Refill  . benzonatate (TESSALON) 100 MG capsule 1-2 CAPSULES BY MOUTH EVERY 8 HOURS AS NEEDED (PRN) COUGH 0  . cyanocobalamin (VITAMIN B12) 1000 MCG tablet Take 1,000 mcg by mouth once daily.  Marland Kitchen diltiazem (CARDIZEM CD) 240 MG CD capsule TAKE 1 CAPSULE (240 MG TOTAL) BY MOUTH ONCE DAILY. 90 capsule 3  . dofetilide (TIKOSYN) 500 MCG capsule TAKE 1 CAPSULE (500 MCG TOTAL) BY MOUTH EVERY 12 (TWELVE) HOURS. 60 capsule 4  . KLOR-CON M20 20 mEq ER tablet TAKE 1 TABLET  (20 MEQ TOTAL) BY MOUTH ONCE DAILY. 90 tablet 1  . levothyroxine (SYNTHROID, LEVOTHROID) 75 MCG tablet TAKE 1 TABLET (75 MCG TOTAL) BY MOUTH DAILY BEFORE BREAKFAST. 2  . naproxen sodium (ALEVE, ANAPROX) 220 MG tablet Take 220 mg by mouth 2 (two) times daily with meals.  . rivaroxaban (XARELTO) 20 mg tablet Take 1 tablet (20 mg total) by mouth once daily. 30 tablet 11   No current facility-administered medications for this visit.   Allergies: Patient has no known allergies.  Social and Family History  Social History reports that she has never smoked. She has never used smokeless tobacco. She reports that she drinks alcohol. She reports that she does not use drugs.  Family History Family History  Problem Relation Age of Onset  . Arthritis Mother  . Colon cancer Mother  . Heart disease Father   Review of Systems  Review of Systems  Constitutional: Negative for chills, diaphoresis, fever, malaise/fatigue and weight loss.  HENT: Negative for congestion, ear discharge, hearing loss and tinnitus.  Eyes: Negative for blurred vision.  Respiratory: Negative for cough, hemoptysis, sputum production and wheezing.  Cardiovascular: Negative for chest pain, orthopnea, claudication, leg swelling and PND.  Gastrointestinal: Negative for abdominal pain, blood in stool, constipation, diarrhea, heartburn, melena, nausea and vomiting.  Genitourinary: Negative for dysuria, frequency, hematuria and urgency.  Musculoskeletal: Negative for back pain, falls, joint pain and myalgias.  Skin: Negative for itching and rash.  Neurological: Negative for dizziness, tingling, focal weakness, loss of consciousness, weakness and headaches.  Endo/Heme/Allergies:  Negative for polydipsia. Does not bruise/bleed easily.  Psychiatric/Behavioral: Negative for depression, memory loss and substance abuse. The patient is not nervous/anxious.    Physical Examination   Vitals: BP 122/70  Pulse 72  Resp 12  Ht 157.5 cm (5'  2")  Wt (!) 158.1 kg (348 lb 9.6 oz)  LMP (LMP Unknown)  BMI 63.76 kg/m  Ht:157.5 cm (5\' 2" ) Wt:(!) 158.1 kg (348 lb 9.6 oz) ZOX:WRUEBSA:Body surface area is 2.63 meters squared. Body mass index is 63.76 kg/m.  Wt Readings from Last 3 Encounters:  08/29/16 (!) 158.1 kg (348 lb 9.6 oz)  08/01/16 (!) 160.1 kg (353 lb)  01/21/16 (!) 156.2 kg (344 lb 6.4 oz)   BP Readings from Last 3 Encounters:  08/29/16 122/70  08/01/16 120/80  01/21/16 130/80   General appearance appears in no acute distress  Head Mouth and Eye exam Normocephalic, without obvious abnormality, atraumatic Dentition is good Eyes appear anicteric   Neck exam Thyroid: normal  Nodes: no obvious adenopathy  LUNGS Breath Sounds: Normal Percussion: Normal  CARDIOVASCULAR JVP CV wave: no HJR: no Elevation at 90 degrees: None Carotid Pulse: normal pulsation bilaterally Bruit: None Apex: apical impulse normal  Auscultation Rhythm: atrial fibrillation S1: normal S2: normal Clicks: no Rub: no Murmurs: no murmurs  Gallop: None ABDOMEN Liver enlargement: no Pulsatile aorta: no Ascites: no Bruits: no  EXTREMITIES Clubbing: no Edema: trace to 1+ bilateral pedal edema Pulses: peripheral pulses symmetrical Femoral Bruits: no Amputation: no SKIN Rash: no Cyanosis: no Embolic phemonenon: no Bruising: no NEURO Alert and Oriented to person, place and time: yes Non focal: yes  PSYCH: Pt appears to have normal affect  LABS REVIEWED  Lab Results  Component Value Date  CREATININE 1.0 07/14/2016  BUN 18 07/14/2016  NA 142 07/14/2016  K 4.6 07/14/2016  CL 104 07/14/2016  CO2 31.8 07/14/2016   Assessment and Plan   56 y.o. female with  ICD-10-CM ICD-9-CM  1. Atrial fibrillation, unspecified type-patient has had recurrent atrial fibrillation. This is likely secondary to despite using CPAP. She has been on her dofetilide and has been back on Xarelto for 4 weeks.. Will proceed with re-cardioverting.  Weight loss is going to be essential for long-term maintenance of sinus rhythm along with CPAP use. I48.91 427.31   2. Paroxysmal atrial fibrillation-as per above I48.0 427.31   Return in about 2 weeks (around 09/12/2016).  These notes generated with voice recognition software. I apologize for typographical errors.  Denton ArKENNETH ALAN Marsia Cino, MD    Pt seen and examined. No change from above

## 2016-09-30 DIAGNOSIS — L03119 Cellulitis of unspecified part of limb: Secondary | ICD-10-CM | POA: Diagnosis not present

## 2016-10-04 DIAGNOSIS — I4891 Unspecified atrial fibrillation: Secondary | ICD-10-CM | POA: Diagnosis not present

## 2016-10-12 ENCOUNTER — Ambulatory Visit (INDEPENDENT_AMBULATORY_CARE_PROVIDER_SITE_OTHER): Payer: BLUE CROSS/BLUE SHIELD | Admitting: Nurse Practitioner

## 2016-10-12 VITALS — BP 148/80 | HR 72 | Temp 98.7°F | Resp 17 | Ht 62.0 in | Wt 354.0 lb

## 2016-10-12 DIAGNOSIS — R609 Edema, unspecified: Secondary | ICD-10-CM

## 2016-10-12 DIAGNOSIS — M170 Bilateral primary osteoarthritis of knee: Secondary | ICD-10-CM | POA: Diagnosis not present

## 2016-10-12 DIAGNOSIS — R21 Rash and other nonspecific skin eruption: Secondary | ICD-10-CM | POA: Diagnosis not present

## 2016-10-12 MED ORDER — DICLOFENAC SODIUM 1 % TD GEL: 4.0000 g | Freq: Four times a day (QID) | TRANSDERMAL | 1 refills | Status: DC

## 2016-10-12 MED ORDER — FUROSEMIDE 20 MG PO TABS: 20.0000 mg | ORAL_TABLET | Freq: Every day | ORAL | 3 refills | Status: DC

## 2016-10-12 MED ORDER — POTASSIUM CHLORIDE ER 20 MEQ PO TBCR: 10.0000 meq | EXTENDED_RELEASE_TABLET | Freq: Every day | ORAL | 0 refills | Status: DC

## 2016-10-12 MED ORDER — PREDNISONE 20 MG PO TABS: ORAL_TABLET | ORAL | 0 refills | Status: DC

## 2016-10-12 NOTE — Patient Instructions (Addendum)
Katherine Scott, Thank you for coming in to clinic today.  1. This is likely an allergic reaction. - Possibly to your cephalexin. - Pay attention to any new products that come in contact with your skin. - Call tomorrow if there is any worsening of your symptoms after starting your prednisone.  Prednisone taper: Take 2 tablets for 5 days Take 1 tablet for 5 days Take 1/2 tablet for 4 days.    2. For your lower leg swelling:  - Take furosemide 20 mg once daily x 5 - 10 days. - Take potassium 20 meq once daily x 5 - 10 days.  3. For your osteoarthritis of your knee and ankle. - USE diclofenac gel up to 4 times daily for your knee pain.   - REDUCE your ibuprofen use to 600 mg every 6 hours or 800 mg every 8 hours.   Please schedule a follow-up appointment with Wilhelmina McardleLauren Trinidee Schrag, AGNP to Return 5-7 days if symptoms worsen or fail to improve.  If you have any other questions or concerns, please feel free to call the clinic or send a message through MyChart. You may also schedule an earlier appointment if necessary.  Wilhelmina McardleLauren Enyah Moman, DNP, AGNP-BC Adult Gerontology Nurse Practitioner [facility], [facility]

## 2016-10-12 NOTE — Progress Notes (Signed)
Subjective:    [name], female    DOB: 1961/01/09, 56 y.o.   MRN: [id number]  Katherine Scott is a 56 y.o. female presenting on 10/12/2016 for Cellulitis (went to urgent care 10/03/16)   HPI  Cellulitis Went to Urgent Care and took cephalexin and something else as a "backup" (filled at [facility] [place]).  Pt cannot remember what the other medication is.  Pt had initially thought it was reaction from sun.  Left leg and ankle were swollen, red, but not draining and pt couldn't move left leg/ankle.  Pain in leg improved w/ ibuprofen.  Now pt notes similar symptoms in R leg and bilateral arms w/ less edema and non-diffuse rash.  Stopped taking medications on Tuesday night at end of 10 day course.  Course of current/new symptoms: Rash Started on R leg  4 days ago w/ itching, swelling, papular rash - some diffuse papules, some confluent papules. Rash Started on bilateral arms 4 days ago w/ itching, diffuse papular rash on anterior, medial aspect of upper arm.   These rashes are different from symptoms of Left leg for which she was treated for cellulitis.  Specifically, her left leg hurt and felt like muscle/bone preventing walking and was also hot to touch. Current rashes do not have pain or warmth to touch. - Pt notes she did have fever yesterday and hasn't felt good today.   - Denies chills, sweats, nausea, vomiting, diarrhea, constipation.  Pt denies any new products w/ contact to skin and any new soap products.     Social History  Substance Use Topics  . Smoking status: Never Smoker  . Smokeless tobacco: Never Used  . Alcohol use No    Review of Systems Per HPI unless specifically indicated above     Objective:    BP (!) 148/80 (BP Location: Right Arm, Patient Position: Sitting, Cuff Size: Large)   Pulse 72   Temp 98.7 F (37.1 C) (Oral)   Resp 17   Ht 5\' 2"  (1.575 m)   Wt (!) 354 lb (160.6 kg)   SpO2 94%   BMI 64.75 kg/m   Wt Readings from Last 3 Encounters:    10/12/16 (!) 354 lb (160.6 kg)  09/22/16 (!) 351 lb (159.2 kg)  08/11/16 (!) 351 lb 8 oz (159.4 kg)    Physical Exam  Constitutional: She appears well-developed and well-nourished.  HENT:  Head: Normocephalic and atraumatic.  Cardiovascular: Normal rate, regular rhythm and normal heart sounds.   Pulmonary/Chest: Effort normal and breath sounds normal. No respiratory distress.  Lymphadenopathy:    She has no axillary adenopathy.       Right: No inguinal adenopathy present.       Left: No inguinal adenopathy present.  Skin:     Vitals reviewed.   ANTERIOR/Lateral RIGHT leg   ANTERIOR/Medial RIGHT leg     Results for orders placed or performed during the hospital encounter of 02/18/15  Basic metabolic panel  Result Value Ref Range   Sodium 142 135 - 145 mmol/L   Potassium 4.0 3.5 - 5.1 mmol/L   Chloride 105 101 - 111 mmol/L   CO2 27 22 - 32 mmol/L   Glucose, Bld 114 (H) 65 - 99 mg/dL   BUN 15 6 - 20 mg/dL   Creatinine, Ser 4.090.81 0.44 - 1.00 mg/dL   Calcium 9.5 8.9 - 81.110.3 mg/dL   GFR calc non Af Amer >60 >60 mL/min   GFR calc Af Amer >60 >  60 mL/min   Anion gap 10 5 - 15  Magnesium  Result Value Ref Range   Magnesium 1.9 1.7 - 2.4 mg/dL  Basic metabolic panel  Result Value Ref Range   Sodium 140 135 - 145 mmol/L   Potassium 3.6 3.5 - 5.1 mmol/L   Chloride 105 101 - 111 mmol/L   CO2 27 22 - 32 mmol/L   Glucose, Bld 113 (H) 65 - 99 mg/dL   BUN 13 6 - 20 mg/dL   Creatinine, Ser 2.95 0.44 - 1.00 mg/dL   Calcium 8.8 (L) 8.9 - 10.3 mg/dL   GFR calc non Af Amer >60 >60 mL/min   GFR calc Af Amer >60 >60 mL/min   Anion gap 8 5 - 15  Magnesium  Result Value Ref Range   Magnesium 2.0 1.7 - 2.4 mg/dL  Potassium  Result Value Ref Range   Potassium 3.7 3.5 - 5.1 mmol/L  Potassium  Result Value Ref Range   Potassium 4.6 3.5 - 5.1 mmol/L  CBC  Result Value Ref Range   WBC 8.8 3.6 - 11.0 K/uL   RBC 5.07 3.80 - 5.20 MIL/uL   Hemoglobin 13.3 12.0 - 16.0 g/dL   HCT  62.1 30.8 - 65.7 %   MCV 81.8 80.0 - 100.0 fL   MCH 26.2 26.0 - 34.0 pg   MCHC 32.0 32.0 - 36.0 g/dL   RDW 84.6 (H) 96.2 - 95.2 %   Platelets 206 150 - 440 K/uL  Basic metabolic panel  Result Value Ref Range   Sodium 140 135 - 145 mmol/L   Potassium 4.1 3.5 - 5.1 mmol/L   Chloride 106 101 - 111 mmol/L   CO2 27 22 - 32 mmol/L   Glucose, Bld 116 (H) 65 - 99 mg/dL   BUN 14 6 - 20 mg/dL   Creatinine, Ser 8.41 0.44 - 1.00 mg/dL   Calcium 9.0 8.9 - 32.4 mg/dL   GFR calc non Af Amer >60 >60 mL/min   GFR calc Af Amer >60 >60 mL/min   Anion gap 7 5 - 15  Magnesium  Result Value Ref Range   Magnesium 1.9 1.7 - 2.4 mg/dL      Assessment & Plan:   Problem List Items Addressed This Visit    None    Visit Diagnoses    Papular rash    -  Primary Papular rash likely allergic reaction to cephalexin or other medication vs. Cellulitis/erysipelas.  Pt w/ recent completion of antibiotic course for STTI of left lower extremity and low likelihood for cellulitis/erysipelas w/ new eruptions in multiple locations while still taking antibiotic.  Plan: 1. Treat as if allergic reaction w/ close follow up for additional treatment if no resolution. - Take prednisone taper w/ 40 mg x 5 days, 20 mg x 5 days, 10 mg x 4 days.    Edema, unspecified type     Pt w/ increased edema in lower extremities and is affecting pt's mobility.    Plan: 1. For lower leg swelling take furosemide 20 mg once daily x 5 days. 2. Take an extra potassium supplement of 20 meq once daily for 5 days. 3. If swelling returns, consider other causes and evaluate. 4. Follow up  As needed in 3-5 days.    Primary osteoarthritis of both knees     Pt taking high doses of ibuprofen for knee pain. Plan: 1. Reduce ibuprofen use to 2400 mg total per day. 2. START voltaren gel topically to knees tid  prn moderate pain. 3. Work to lose weight as directed and follow up w/ orthopedic MD if needing additional pain control prior to surgery for  knee replacement.   Relevant Medications   predniSONE (DELTASONE) 20 MG tablet   diclofenac sodium (VOLTAREN) 1 % GEL      Meds ordered this encounter  Medications  . predniSONE (DELTASONE) 20 MG tablet    Sig: Take 40 mg (two tablets) for 5 days, take 20 mg (one tablet) for 5 days, take 10 mg (1/2 tablet) for 4 days.    Dispense:  17 tablet    Refill:  0    Order Specific Question:   Supervising Provider    Answer:   Smitty Cords [[id number]]  . potassium chloride 20 MEQ TBCR    Sig: Take 10 mEq by mouth daily.    Dispense:  10 tablet    Refill:  0    Order Specific Question:   Supervising Provider    Answer:   Smitty Cords [[id number]]  . diclofenac sodium (VOLTAREN) 1 % GEL    Sig: Apply 4 g topically 4 (four) times daily.    Dispense:  300 g    Refill:  1    Order Specific Question:   Supervising Provider    Answer:   Smitty Cords [[id number]]  . furosemide (LASIX) 20 MG tablet    Sig: Take 1 tablet (20 mg total) by mouth daily.    Dispense:  10 tablet    Refill:  0    Order Specific Question:   Supervising Provider    Answer:   Smitty Cords [[id number]]      Follow up plan: Return 5-7 days if symptoms worsen or fail to improve.   Wilhelmina Mcardle, DNP, AGPCNP-BC Adult Gerontology Primary Care Nurse Practitioner [facility] 10/15/2016, 11:46 PM

## 2016-10-15 ENCOUNTER — Encounter: Payer: Self-pay | Admitting: Nurse Practitioner

## 2016-10-15 MED ORDER — FUROSEMIDE 20 MG PO TABS: 20.0000 mg | ORAL_TABLET | Freq: Every day | ORAL | 0 refills | Status: DC

## 2016-10-16 NOTE — Progress Notes (Signed)
I have reviewed this encounter including the documentation in this note and/or discussed this patient with the provider, Wilhelmina McardleLauren Kennedy, AGPCNP-BC. I am certifying that I agree with the content of this note as supervising physician.  Saralyn PilarAlexander Asucena Galer, DO [facility] 10/16/2016, 5:07 PM

## 2016-10-20 ENCOUNTER — Other Ambulatory Visit: Payer: BLUE CROSS/BLUE SHIELD

## 2016-10-20 DIAGNOSIS — E039 Hypothyroidism, unspecified: Secondary | ICD-10-CM | POA: Diagnosis not present

## 2016-10-20 LAB — TSH: TSH: 4.11 mIU/L

## 2016-10-23 ENCOUNTER — Other Ambulatory Visit: Payer: Self-pay | Admitting: Nurse Practitioner

## 2016-10-23 DIAGNOSIS — E039 Hypothyroidism, unspecified: Secondary | ICD-10-CM

## 2016-11-03 ENCOUNTER — Telehealth: Payer: Self-pay

## 2016-11-03 MED ORDER — DOXYCYCLINE HYCLATE 100 MG PO TABS: 100.0000 mg | ORAL_TABLET | Freq: Two times a day (BID) | ORAL | 0 refills | Status: DC

## 2016-11-03 MED ORDER — AMOXICILLIN 500 MG PO TABS: 500.0000 mg | ORAL_TABLET | Freq: Two times a day (BID) | ORAL | 0 refills | Status: AC

## 2016-11-03 NOTE — Telephone Encounter (Signed)
Patient called reporting that her cellulitis has came back in the past 2 days.  [facility] [place]  She reported that she has an appointment Thursday and she can not get off work today.  Please advise

## 2016-11-03 NOTE — Telephone Encounter (Signed)
Cellulitis: Left leg itching and blistering and some blisters are starting to pop.  Last antibiotic cleared the infection, but has returned.  Pt will be seen in clinic on Thursday next week for her annual physical and follow up.  Rash on r leg cleared w/ steroid cream

## 2016-11-06 NOTE — Telephone Encounter (Signed)
error 

## 2016-11-09 ENCOUNTER — Ambulatory Visit (INDEPENDENT_AMBULATORY_CARE_PROVIDER_SITE_OTHER): Payer: BLUE CROSS/BLUE SHIELD | Admitting: Nurse Practitioner

## 2016-11-09 ENCOUNTER — Encounter: Payer: Self-pay | Admitting: Nurse Practitioner

## 2016-11-09 VITALS — BP 129/63 | HR 59 | Temp 98.3°F | Ht 62.0 in | Wt 352.0 lb

## 2016-11-09 DIAGNOSIS — E039 Hypothyroidism, unspecified: Secondary | ICD-10-CM

## 2016-11-09 DIAGNOSIS — L03119 Cellulitis of unspecified part of limb: Secondary | ICD-10-CM | POA: Diagnosis not present

## 2016-11-09 DIAGNOSIS — L02419 Cutaneous abscess of limb, unspecified: Secondary | ICD-10-CM | POA: Diagnosis not present

## 2016-11-09 DIAGNOSIS — W57XXXA Bitten or stung by nonvenomous insect and other nonvenomous arthropods, initial encounter: Secondary | ICD-10-CM

## 2016-11-09 DIAGNOSIS — S30861A Insect bite (nonvenomous) of abdominal wall, initial encounter: Secondary | ICD-10-CM

## 2016-11-09 DIAGNOSIS — Z0000 Encounter for general adult medical examination without abnormal findings: Secondary | ICD-10-CM | POA: Diagnosis not present

## 2016-11-09 MED ORDER — LEVOTHYROXINE SODIUM 75 MCG PO TABS: 75.0000 ug | ORAL_TABLET | Freq: Every day | ORAL | 2 refills | Status: DC

## 2016-11-09 NOTE — Patient Instructions (Addendum)
Katherine Scott, Thank you for coming in to clinic today.  1. FOr your lower extremities: - US veins and arteries  2. For your physical - No other new findings. You will be due for FASTING BLOOD WORK (no food or drink after midnight before, only water or coffee without cream/sugar on the morning of)  - Please go ahead and schedule a "Lab Only" visit in the morning at the clinic for lab draw in the next 7 days  For Lab Results, once available within 2-3 days of blood draw, you can can log in to MyChart online to view your results and a brief explanation. Also, we can discuss results at next follow-up visit.  3. For your thyroid: - Close lab followup w/ TSH again near 12/07/2016. - Office visit 6 months.  Please schedule a follow-up appointment with Katherine Scott, AGNP. Return in about 1 year (around 11/09/2017) for annual physical and 6 months for follow up of  thyroid.  If you have any other questions or concerns, please feel free to call the clinic or send a message through MyChart. You may also schedule an earlier appointment if necessary.  You will receive a survey after today's visit either digitally by e-mail or paper by [employer] mail. Your experiences and feedback matter to us.  Please respond so we know how we are doing as we provide care for you.   Katherine McardleLauren Suni Jarnagin, DNP, AGNP-BC Adult Gerontology Nurse Practitioner [facility], [facility]

## 2016-11-09 NOTE — Progress Notes (Signed)
Subjective:    [name], female    DOB: 11-06-1960, 56 y.o.   MRN: [id number]  Katherine DenverKay H Pardi is a 56 y.o. female presenting on 11/09/2016 for Annual Exam   HPI Annual Physical Exam Patient has been feeling well with improvement in left leg cellulitis without pus and leaking. She has 2-3 days antibiotics left.  They have acute concerns today for lesions on her torso from abdomen around to spine.  Sleeps 5-6 hours per night nuinterrupted.  HEALTH MAINTENANCE: Weight/BMI: stable Physical activity: limited by foot and knee.  No regular exercise routine. Diet: Is trying to limit eat out 2-4 times per week.  Limiting fried foods/fastfood.  Is planning meals and taking lunch much more frequently. Seatbelt: always Sunscreen: always when outside PAP: not done - hysterectomy w/o cervical cancer. Complete hysterectomy. Mammogram: biopsy 2014 DEXA: Never had bone density - is postmenopausal and has had complete hysterectomy. Colonoscopy: [facility] - normal w/o polyps  VACCINES: Tetanus: Given less than 10 years ago Shingles: not received - defers to later date  Past Medical History:  Diagnosis Date  . Abnormal mammogram, unspecified 2013  . Atrial fibrillation (HCC)    2013  . Breast screening, unspecified   . Obesity, unspecified   . Screening for obesity   . Special screening for malignant neoplasms, colon   . Unspecified essential hypertension    Past Surgical History:  Procedure Laterality Date  . ABDOMINAL HYSTERECTOMY  2011  . ANTERIOR CRUCIATE LIGAMENT REPAIR  2003  . BREAST BIOPSY Left January 22, 2012   left breast stereotactic biopsy showed fibroadenomatous changes with microcalcifications, fat necrosis, and sclerosing adenosis and columnar cell changes. No evidence of atypia or malignancy.  Marland Kitchen. CARDIOVERSION N/A 09/22/2016   Procedure: CARDIOVERSION;  Surgeon: Dalia HeadingFath, Kenneth A, MD;  Location: [facility] ORS;  Service: Cardiovascular;   Laterality: N/A;  . CESAREAN SECTION  1985, 1987, and 1998  . COLONOSCOPY  2011   Dr. Mechele CollinElliott  . ELECTROPHYSIOLOGIC STUDY N/A 12/03/2014   Procedure: Cardioversion;  Surgeon: Dalia HeadingKenneth A Fath, MD;  Location: [facility] ORS;  Service: Cardiovascular;  Laterality: N/A;  . ELECTROPHYSIOLOGIC STUDY N/A 04/01/2015   Procedure: Cardioversion;  Surgeon: Dalia HeadingKenneth A Fath, MD;  Location: [facility] ORS;  Service: Cardiovascular;  Laterality: N/A;  . KNEE SURGERY  2013   Social History   Social History  . Marital status: Divorced    Spouse name: N/A  . Number of children: N/A  . Years of education: N/A   Occupational History  . Not on file.   Social History Main Topics  . Smoking status: Never Smoker  . Smokeless tobacco: Never Used  . Alcohol use No  . Drug use: No  . Sexual activity: Not on file   Other Topics Concern  . Not on file   Social History Narrative  . No narrative on file   Family History  Problem Relation Age of Onset  . Colon cancer Mother        diagnosis age 56's  . Colon cancer Sister        diagnosis age 10345  . Atrial fibrillation Sister   . Breast cancer Other        Maternal Grandmother; diagnosis age 56's  . Lung cancer Other        Paternal Grandmother; diagnosis age 56's  . Colon cancer Other        Paternal Grandfather; diagnosis age 56's  . Heart disease  Father    Current Outpatient Prescriptions on File Prior to Visit  Medication Sig  . amoxicillin (AMOXIL) 500 MG tablet Take 1 tablet (500 mg total) by mouth 2 (two) times daily.  . cyanocobalamin ([employer] VITAMIN B12) 2000 MCG tablet Take 2,000 mcg by mouth daily after breakfast.  . diclofenac sodium (VOLTAREN) 1 % GEL Apply 4 g topically 4 (four) times daily.  Marland Kitchen diltiazem (CARDIZEM CD) 240 MG 24 hr capsule Take 240 mg by mouth daily after breakfast.  . dofetilide (TIKOSYN) 500 MCG capsule Take 500 mcg by mouth 2 (two) times daily.   Marland Kitchen doxycycline (VIBRA-TABS) 100 MG tablet Take 1 tablet (100 mg total) by mouth 2  (two) times daily.  . furosemide (LASIX) 20 MG tablet Take 1 tablet (20 mg total) by mouth daily.  Marland Kitchen levothyroxine (SYNTHROID, LEVOTHROID) 75 MCG tablet Take 1 tablet (75 mcg total) by mouth daily before breakfast.  . naproxen sodium (ANAPROX) 220 MG tablet Take 220 mg by mouth 2 (two) times daily.   . potassium chloride 20 MEQ TBCR Take 10 mEq by mouth daily.  . potassium chloride SA (K-DUR,KLOR-CON) 20 MEQ tablet Take 1 tablet (20 mEq total) by mouth daily. (Patient not taking: Reported on 11/09/2016)   No current facility-administered medications on file prior to visit.     Review of Systems Per HPI unless specifically indicated above      Objective:    BP 129/63 (BP Location: Right Arm, Patient Position: Sitting, Cuff Size: Large)   Pulse (!) 59   Temp 98.3 F (36.8 C) (Oral)   Ht 5\' 2"  (1.575 m)   Wt (!) 352 lb (159.7 kg)   BMI 64.38 kg/m   Wt Readings from Last 3 Encounters:  11/09/16 (!) 352 lb (159.7 kg)  10/12/16 (!) 354 lb (160.6 kg)  09/22/16 (!) 351 lb (159.2 kg)    Physical Exam  Constitutional: She is oriented to person, place, and time. She appears well-developed and well-nourished. No distress.  Morbidly obese  HENT:  Head: Normocephalic and atraumatic.  Mouth/Throat: Oropharynx is clear and moist.  Eyes: Pupils are equal, round, and reactive to light. EOM are normal.  Neck: Normal range of motion. Neck supple. No JVD present. Carotid bruit is not present. No tracheal deviation present. No thyromegaly present.  Cardiovascular: Normal rate, regular rhythm, normal heart sounds and intact distal pulses.   Pulmonary/Chest: Effort normal and breath sounds normal. No respiratory distress. Right breast exhibits no inverted nipple, no mass, no nipple discharge, no skin change and no tenderness. Left breast exhibits no inverted nipple, no mass, no nipple discharge, no skin change and no tenderness. Breasts are symmetrical.  Abdominal: Soft. Bowel sounds are normal. She  exhibits no distension and no mass. There is no tenderness.  No hepatosplenomegaly noted on scratch test  Musculoskeletal: Normal range of motion.  Lymphadenopathy:    She has no cervical adenopathy.  Neurological: She is alert and oriented to person, place, and time.  Skin: Skin is warm and dry. Rash noted. There is erythema.     Psychiatric: She has a normal mood and affect. Her behavior is normal. Judgment and thought content normal.       Results for orders placed or performed in visit on 10/20/16  TSH  Result Value Ref Range   TSH 4.11 mIU/L      Assessment & Plan:   Problem List Items Addressed This Visit      Endocrine   Acquired hypothyroidism  Improving on levothyroxine replacement therapy.  Pt taking levothyroxine correctly.  Plan: 1. Prior to today's visit, dose adjusted and needs recollect around 12/07/2016 for additional levothyroxine titration. 2. Follow up in late August.      Relevant Medications   levothyroxine (SYNTHROID, LEVOTHROID) 75 MCG tablet   Other Relevant Orders   Lipid panel   Hemoglobin A1c    Other Visit Diagnoses    Encounter for annual physical exam    -  Primary Physical exam with new findings of skin lesions.  Well adult with no other acute concerns.  Plan: 1. Obtain health maintenance screenings. 2. Return 1 year for annual physical.   Relevant Orders   Lipid panel   Hemoglobin A1c   Cellulitis and abscess of leg, except foot     Improving course.  Pt w/ antibiotic treatment remaining.    Plan: 1. If no complete resolution, evaluate w/ wound clinic. 2. Send pt for tests to evaluate arterial flow w/ vascular ultrasound and ABI/TBI. Consider further evaluation for venous insufficiency also. 3. Follow up 7-10 days or sooner if needed.    Nonvenomous insect bite of flank, initial encounter     Acute lesions.  Healing well.  No additional action required.      Meds ordered this encounter  Medications  . levothyroxine  (SYNTHROID, LEVOTHROID) 75 MCG tablet    Sig: Take 1 tablet (75 mcg total) by mouth daily before breakfast.    Dispense:  30 tablet    Refill:  2    Order Specific Question:   Supervising Provider    Answer:   Smitty CordsKARAMALEGOS, ALEXANDER J [[id number]]      Follow up plan: Return in about 1 year (around 11/09/2017) for annual physical and 6 months for follow up of  thyroid.  Wilhelmina McardleLauren Stefanee Mckell, DNP, AGPCNP-BC Adult Gerontology Primary Care Nurse Practitioner [facility] 11/09/2016, 3:48 PM

## 2016-11-13 NOTE — Progress Notes (Signed)
I have reviewed this encounter including the documentation in this note and/or discussed this patient with the provider, Wilhelmina McardleLauren Kennedy, AGPCNP-BC. I am certifying that I agree with the content of this note as supervising physician.  Saralyn PilarAlexander Sydell Prowell, DO [facility] 11/13/2016, 12:25 PM

## 2016-11-13 NOTE — Assessment & Plan Note (Signed)
Improving on levothyroxine replacement therapy.  Pt taking levothyroxine correctly.  Plan: 1. Prior to today's visit, dose adjusted and needs recollect around 12/07/2016 for additional levothyroxine titration. 2. Follow up in late August.

## 2016-11-15 ENCOUNTER — Other Ambulatory Visit: Payer: BLUE CROSS/BLUE SHIELD

## 2016-11-15 DIAGNOSIS — E039 Hypothyroidism, unspecified: Secondary | ICD-10-CM | POA: Diagnosis not present

## 2016-11-16 LAB — LIPID PANEL
Cholesterol: 190 mg/dL (ref <200)
HDL: 61 mg/dL (ref >50)
LDL Cholesterol: 106 mg/dL — ABNORMAL HIGH (ref <100)
Total CHOL/HDL Ratio: 3.1 Ratio (ref <5.0)
Triglycerides: 114 mg/dL (ref <150)
VLDL: 23 mg/dL (ref <30)

## 2016-11-16 LAB — HEMOGLOBIN A1C
Hgb A1c MFr Bld: 5.6 % (ref <5.7)
Mean Plasma Glucose: 114 mg/dL

## 2017-01-02 ENCOUNTER — Other Ambulatory Visit: Payer: Self-pay

## 2017-01-02 DIAGNOSIS — E039 Hypothyroidism, unspecified: Secondary | ICD-10-CM

## 2017-01-02 MED ORDER — LEVOTHYROXINE SODIUM 75 MCG PO TABS: 75.0000 ug | ORAL_TABLET | Freq: Every day | ORAL | 2 refills | Status: DC

## 2017-01-04 ENCOUNTER — Other Ambulatory Visit: Payer: Self-pay

## 2017-01-04 DIAGNOSIS — E039 Hypothyroidism, unspecified: Secondary | ICD-10-CM

## 2017-01-04 MED ORDER — LEVOTHYROXINE SODIUM 75 MCG PO TABS: 75.0000 ug | ORAL_TABLET | Freq: Every day | ORAL | 2 refills | Status: DC

## 2017-01-12 ENCOUNTER — Other Ambulatory Visit: Payer: Self-pay | Admitting: Nurse Practitioner

## 2017-01-12 NOTE — Telephone Encounter (Signed)
The pt was notified. Appt scheduled for Tuesday,  October 9th.

## 2017-01-12 NOTE — Telephone Encounter (Signed)
Attempted to contact the pt, no answer. LMOM to return my call.  

## 2017-01-12 NOTE — Telephone Encounter (Signed)
Pt. Called  States that her cellulitis had  Come back requesting  Medication be called in.

## 2017-01-12 NOTE — Telephone Encounter (Signed)
This is second recurrence of cellulitis.  We need to evaluate why it continues to return.  Will recommend urgent wound care referral or repeat clinic visit here at [facility] for evaluation.    Last antbiotics initiated 11/03/16: amoxicillin/doxycycline bid x 10 days  10/12/16: resolving cellulitis after treatment from urgent care and cephalexin or similar medication.  Pt did not recall exact antibiotic.  Possible papular rash 2/2 antibiotic.

## 2017-01-16 ENCOUNTER — Encounter: Payer: Self-pay | Admitting: Nurse Practitioner

## 2017-01-16 ENCOUNTER — Ambulatory Visit (INDEPENDENT_AMBULATORY_CARE_PROVIDER_SITE_OTHER): Payer: BLUE CROSS/BLUE SHIELD | Admitting: Nurse Practitioner

## 2017-01-16 VITALS — BP 148/68 | HR 69 | Temp 98.0°F | Ht 62.0 in | Wt 354.0 lb

## 2017-01-16 DIAGNOSIS — L03119 Cellulitis of unspecified part of limb: Secondary | ICD-10-CM

## 2017-01-16 DIAGNOSIS — Z6841 Body Mass Index (BMI) 40.0 and over, adult: Secondary | ICD-10-CM | POA: Diagnosis not present

## 2017-01-16 MED ORDER — LIRAGLUTIDE -WEIGHT MANAGEMENT 18 MG/3ML ~~LOC~~ SOPN: 0.6000 mg | PEN_INJECTOR | Freq: Every day | SUBCUTANEOUS | 5 refills | Status: DC

## 2017-01-16 MED ORDER — AMOXICILLIN-POT CLAVULANATE 875-125 MG PO TABS: 1.0000 | ORAL_TABLET | Freq: Two times a day (BID) | ORAL | 0 refills | Status: AC

## 2017-01-16 MED ORDER — DOXYCYCLINE HYCLATE 100 MG PO TABS: 100.0000 mg | ORAL_TABLET | Freq: Two times a day (BID) | ORAL | 0 refills | Status: DC

## 2017-01-16 NOTE — Progress Notes (Signed)
Subjective:    [name], female    DOB: Jun 04, 1960, 56 y.o.   MRN: [id number]  Katherine Scott is a 56 y.o. female presenting on 01/16/2017 for Recurrent Skin Infections (recurrent cellulitis on lower extremities )   HPI Cellulitis - Recurrent Started 2 weeks ago.  Weeping began last Thursday.  Worst in evening.  This is third occurrence in last 6 months. Pt is sedentary.  Pt notes is able to be more active, but has low exercise tolerance.  When she was at beach and more active, she states she felt much better and had less leg swelling.  Is planning another trip to mountains and wants cellulitis resolved prior to this trip.  Morbid Obesity Stable weight since last visit.  Pt cites frustration because she is limiting caloric intake.  She has small breakfast, usually carries small lunch with her to work, prepares small dinner at home w/ meals out to eat about 2x per week.   Is highly sedentary.  Works at a sedentary job, goes home and is sedentary there as well.    Social History  Substance Use Topics  . Smoking status: Never Smoker  . Smokeless tobacco: Never Used  . Alcohol use No    Review of Systems Per HPI unless specifically indicated above     Objective:    BP (!) 148/68 (BP Location: Right Arm, Patient Position: Sitting, Cuff Size: Normal)   Pulse 69   Temp 98 F (36.7 C) (Oral)   Ht  (1.575 m)   Wt (!) 354 lb (160.6 kg)   SpO2 90%   BMI 64.75 kg/m   Wt Readings from Last 3 Encounters:  01/16/17 (!) 354 lb (160.6 kg)  11/09/16 (!) 352 lb (159.7 kg)  10/12/16 (!) 354 lb (160.6 kg)    Physical Exam General - morbidly obese, well-appearing, NAD HEENT - Normocephalic, atraumatic Neck - supple, non-tender, no LAD, no thyromegaly, no carotid bruit Heart - RRR, no murmurs heard Lungs - Clear throughout all lobes, no wheezing, crackles, or rhonchi. Normal work of breathing. Extremeties - non-tender, +2 non-pitting edema, cap refill < 2 seconds,  peripheral pulses intact +2 radial, +1 DP bilaterally Skin - warm, dry, bilateral lower extremities w/ diffusely demarcated erythema, edema w/ associated warmth, flakiness Neuro - awake, alert, oriented x3, antalgic gait Psych - Normal mood and affect, normal behavior   Results for orders placed or performed in visit on 01/16/17  TSH  Result Value Ref Range   TSH 3.55 0.40 - 4.50 mIU/L  Comprehensive metabolic panel  Result Value Ref Range   Glucose, Bld 107 65 - 139 mg/dL   BUN 16 7 - 25 mg/dL   Creat 1.30 8.65 - 7.84 mg/dL   BUN/Creatinine Ratio NOT APPLICABLE 6 - 22 (calc)   Sodium 142 135 - 146 mmol/L   Potassium 3.9 3.5 - 5.3 mmol/L   Chloride 102 98 - 110 mmol/L   CO2 31 20 - 32 mmol/L   Calcium 9.0 8.6 - 10.4 mg/dL   Total Protein 7.1 6.1 - 8.1 g/dL   Albumin 4.3 3.6 - 5.1 g/dL   Globulin 2.8 1.9 - 3.7 g/dL (calc)   AG Ratio 1.5 1.0 - 2.5 (calc)   Total Bilirubin 0.7 0.2 - 1.2 mg/dL   Alkaline phosphatase (APISO) 87 33 - 130 U/L   AST 17 10 - 35 U/L   ALT 21 6 - 29 U/L      Assessment & Plan:  Problem List Items Addressed This Visit      Other   BMI 60.0-69.9, adult (HCC)    Stable weight but persistently severe morbid obesity.  Pt has improved from regular weight gain to stable weight w/ focus on diet.  Has concerns for weight and is frustrated since she is trying to reduce oral intake.  Plan: 1. Increase physical activity to 30 minutes most days of the week. 2. Keep food log and reduce overall oral intake. 3. START Saxenda injection once daily.  For 1 week, inject 0.6 mg subcutaneously.  Increase by 0.6 mg eacy week until max of 3 mg daily is tolerated.  Can wait to increase for 1 week if experiencing side effects.  Discussed common side effects of nausea, injection site redness or reaction.  Reviewed hypoglycemia as unlikely, but possible side effect. 4. Check labs for possible metabolic contribution: TSH, CMP.  A1c and lipid screened at last visit. 5. Follow up  4 weeks for assessment.      Relevant Medications   Liraglutide -Weight Management (SAXENDA) 18 MG/3ML SOPN   Other Relevant Orders   TSH (Completed)   Comprehensive metabolic panel (Completed)   Recurrent cellulitis of lower extremity - Primary    Acute cellulitis of bilateral lower extremities.  Clinical presentation w/ improving then worsening symptoms and fewer symptoms after a week of increased activity is c/w stasis dermatitis and secondary cellulitis.  Complicated by sedentary activity level and morbid obesity.  Plan: 1. START doxycycline 100 mg one tablet twice daily x 10 days. 2. START Augmentin 875-125 mg one tablet twice daily x 10 days. 3. START wearing compression stockings regularly during day when sitting at desk. 4. Increase movement in legs even if small movements.  Encouraged Increase physical activity to 30 minutes most days of the week. 5. Vascular Surgery referral for additional evaluation. 6. Follow up as needed.       Relevant Medications   Liraglutide -Weight Management (SAXENDA) 18 MG/3ML SOPN   doxycycline (VIBRA-TABS) 100 MG tablet   amoxicillin-clavulanate (AUGMENTIN) 875-125 MG tablet   Other Relevant Orders   Compression stockings   Ambulatory referral to Vascular Surgery      Meds ordered this encounter  Medications  . DISCONTD: dofetilide (TIKOSYN) 500 MCG capsule    Sig: Take by mouth.  . Liraglutide -Weight Management (SAXENDA) 18 MG/3ML SOPN    Sig: Inject 0.6 mg into the skin daily. For 1 week.  Then increase by 0.6 mg each week until you have reached a max of 3.0 mg once daily.    Dispense:  15 mL    Refill:  5    Order Specific Question:   Supervising Provider    Answer:   Smitty Cords [[id number]]  . doxycycline (VIBRA-TABS) 100 MG tablet    Sig: Take 1 tablet (100 mg total) by mouth 2 (two) times daily.    Dispense:  20 tablet    Refill:  0    Order Specific Question:   Supervising Provider    Answer:   Smitty Cords [[id number]]  . amoxicillin-clavulanate (AUGMENTIN) 875-125 MG tablet    Sig: Take 1 tablet by mouth 2 (two) times daily.    Dispense:  20 tablet    Refill:  0    Order Specific Question:   Supervising Provider    Answer:   Smitty Cords [[id number]]  . furosemide (LASIX) 20 MG tablet    Sig: Take 20 mg by mouth daily.  Refill:  3      Follow up plan: Return in about 4 weeks (around 02/13/2017) for weight loss - Saxenda.  Wilhelmina Mcardle, DNP, AGPCNP-BC Adult Gerontology Primary Care Nurse Practitioner [facility] 01/20/2017, 3:52 PM

## 2017-01-16 NOTE — Patient Instructions (Addendum)
Fae, Thank you for coming in to clinic today.  1. For your recurrent cellulitis: - REPEAT Augmentin and doxycycline.  Take both medicines as 1 tab twice daily for 10 days. - START wearing compression socks when sedentary. - Increase your exercise/physical activity of any kind. - A large contributing factor is your obesity.  2. Weight loss: - Take Saxenda injection once daily.   Week 1: take 0.6 mg once daily.   Week 2: take 1.2 mg once daily. Week 3: take 1.8 mg once daily. Week 4: take 2.4 mg once daily. Week 5 and continuing: take 3 mg once daily.  - Work on measuring your portions and keeping a food log.  - Increase physical activity to a total of 30 minutes 5-6 days per week.  If you have nausea or other side effects with a higher dose, continue at your prior dose for an additional week.  Then, resume with increasing to a max of 3 mg per day.   Please schedule a follow-up appointment with Wilhelmina Mcardle, AGNP. Return in about 4 weeks (around 02/13/2017) for weight loss - Saxenda.  If you have any other questions or concerns, please feel free to call the clinic or send a message through MyChart. You may also schedule an earlier appointment if necessary.  You will receive a survey after today's visit either digitally by e-mail or paper by [employer]. Your experiences and feedback matter to [place].  Please respond so we know how we are doing as we provide care for you.  Wilhelmina Mcardle, DNP, AGNP-BC Adult Gerontology Nurse Practitioner [facility]

## 2017-01-17 ENCOUNTER — Telehealth: Payer: Self-pay

## 2017-01-17 DIAGNOSIS — E039 Hypothyroidism, unspecified: Secondary | ICD-10-CM

## 2017-01-17 NOTE — Telephone Encounter (Signed)
Need her TSH check.

## 2017-01-19 ENCOUNTER — Other Ambulatory Visit: Payer: BLUE CROSS/BLUE SHIELD

## 2017-01-19 DIAGNOSIS — Z79899 Other long term (current) drug therapy: Secondary | ICD-10-CM | POA: Diagnosis not present

## 2017-01-19 DIAGNOSIS — E039 Hypothyroidism, unspecified: Secondary | ICD-10-CM | POA: Diagnosis not present

## 2017-01-19 DIAGNOSIS — Z6841 Body Mass Index (BMI) 40.0 and over, adult: Secondary | ICD-10-CM | POA: Diagnosis not present

## 2017-01-19 LAB — COMPREHENSIVE METABOLIC PANEL
AG Ratio: 1.5 (calc) (ref 1.0–2.5)
ALT: 21 U/L (ref 6–29)
AST: 17 U/L (ref 10–35)
Albumin: 4.3 g/dL (ref 3.6–5.1)
Alkaline phosphatase (APISO): 87 U/L (ref 33–130)
BUN: 16 mg/dL (ref 7–25)
CO2: 31 mmol/L (ref 20–32)
Calcium: 9 mg/dL (ref 8.6–10.4)
Chloride: 102 mmol/L (ref 98–110)
Creat: 0.9 mg/dL (ref 0.50–1.05)
Globulin: 2.8 g/dL (calc) (ref 1.9–3.7)
Glucose, Bld: 107 mg/dL (ref 65–139)
Potassium: 3.9 mmol/L (ref 3.5–5.3)
Sodium: 142 mmol/L (ref 135–146)
Total Bilirubin: 0.7 mg/dL (ref 0.2–1.2)
Total Protein: 7.1 g/dL (ref 6.1–8.1)

## 2017-01-19 LAB — TSH: TSH: 3.55 mIU/L (ref 0.40–4.50)

## 2017-01-20 DIAGNOSIS — L03119 Cellulitis of unspecified part of limb: Secondary | ICD-10-CM | POA: Insufficient documentation

## 2017-01-20 NOTE — Assessment & Plan Note (Addendum)
Stable weight but persistently severe morbid obesity.  Pt has improved from regular weight gain to stable weight w/ focus on diet.  Has concerns for weight and is frustrated since she is trying to reduce oral intake.  Plan: 1. Increase physical activity to 30 minutes most days of the week. 2. Keep food log and reduce overall oral intake. 3. START Saxenda injection once daily.  For 1 week, inject 0.6 mg subcutaneously.  Increase by 0.6 mg eacy week until max of 3 mg daily is tolerated.  Can wait to increase for 1 week if experiencing side effects.  Discussed common side effects of nausea, injection site redness or reaction.  Reviewed hypoglycemia as unlikely, but possible side effect. 4. Check labs for possible metabolic contribution: TSH, CMP.  A1c and lipid screened at last visit. 5. Follow up 4 weeks for assessment.

## 2017-01-20 NOTE — Assessment & Plan Note (Signed)
Acute cellulitis of bilateral lower extremities.  Clinical presentation w/ improving then worsening symptoms and fewer symptoms after a week of increased activity is c/w stasis dermatitis and secondary cellulitis.  Complicated by sedentary activity level and morbid obesity.  Plan: 1. START doxycycline 100 mg one tablet twice daily x 10 days. 2. START Augmentin 875-125 mg one tablet twice daily x 10 days. 3. START wearing compression stockings regularly during day when sitting at desk. 4. Increase movement in legs even if small movements.  Encouraged Increase physical activity to 30 minutes most days of the week. 5. Vascular Surgery referral for additional evaluation. 6. Follow up as needed.

## 2017-01-23 ENCOUNTER — Encounter (INDEPENDENT_AMBULATORY_CARE_PROVIDER_SITE_OTHER): Payer: Self-pay | Admitting: Vascular Surgery

## 2017-01-23 ENCOUNTER — Ambulatory Visit (INDEPENDENT_AMBULATORY_CARE_PROVIDER_SITE_OTHER): Payer: BLUE CROSS/BLUE SHIELD | Admitting: Vascular Surgery

## 2017-01-23 VITALS — BP 166/87 | HR 80 | Resp 22 | Ht 63.0 in | Wt 357.0 lb

## 2017-01-23 DIAGNOSIS — L03119 Cellulitis of unspecified part of limb: Secondary | ICD-10-CM | POA: Diagnosis not present

## 2017-01-23 DIAGNOSIS — Z6841 Body Mass Index (BMI) 40.0 and over, adult: Secondary | ICD-10-CM

## 2017-01-23 NOTE — Progress Notes (Signed)
Subjective:    [name], female    DOB: [birth year], 56 y.o.   MRN: [id number] Chief Complaint  Patient presents with  . New Patient (Initial Visit)    Cellulitis of LE   Presents as a new patient referred by NP Kyung Rudd for "recurrent cellulitis". Patient endorses a history of being diagnosed with bilateral lower extremity cellulitis since the beginning of the summer. Patient states she has always noticed "swelling" to the bilateral lower extremity however feels it has worsened over the last couple months. Patient notices swelling is worse towards the end of the day. The swelling is associated with discomfort. The recurrent cellulitis and discomfort prompted the patient to seek medical attention. Patient denies any surgery or trauma to the lower extremity. Patient denies any DVT to the lower extremity. At this time, the patient does not engage in any conservative therapy. Patient denies any fever, nausea or vomiting. Patient is currently on a course of Keflex for cellulitis.   Review of Systems  Constitutional: Negative.   HENT: Negative.   Eyes: Negative.   Respiratory: Negative.   Cardiovascular: Positive for leg swelling.       Cellulitis  Gastrointestinal: Negative.   Endocrine: Negative.   Genitourinary: Negative.   Musculoskeletal: Negative.   Skin: Negative.   Allergic/Immunologic: Negative.   Neurological: Negative.   Hematological: Negative.   Psychiatric/Behavioral: Negative.       Objective:   Physical Exam  Constitutional: She is oriented to person, place, and time. She appears well-developed and well-nourished. No distress.  HENT:  Head: Normocephalic and atraumatic.  Eyes: Pupils are equal, round, and reactive to light. Conjunctivae are normal.  Neck: Normal range of motion.  Cardiovascular: Normal rate, regular rhythm, normal heart sounds and intact distal pulses.   Pulses:      Radial pulses are 2+ on the right side, and 2+ on the left side.    Heart to palpate pedal pulses due to edema and body habitus  Pulmonary/Chest: Effort normal.  Musculoskeletal: Normal range of motion. She exhibits edema (moderate bilateral 2 pitting edema noted.).  Neurological: She is alert and oriented to person, place, and time.  Skin: Skin is warm and dry. She is not diaphoretic.  Patient's skin does not look cellulitic. Skin is intact there is no stasis dermatitis.There are no skin changes.  Psychiatric: She has a normal mood and affect. Her behavior is normal. Judgment and thought content normal.  Vitals reviewed.  BP (!) 166/87 (BP Location: Right Arm)   Pulse 80   Resp (!) 22   Ht  (1.6 m)   Wt (!) 357 lb (161.9 kg)   BMI 63.24 kg/m   Past Medical History:  Diagnosis Date  . Abnormal mammogram, unspecified 2013  . Atrial fibrillation (HCC)    2013  . Breast screening, unspecified   . Obesity, unspecified   . Screening for obesity   . Special screening for malignant neoplasms, colon   . Unspecified essential hypertension    Social History   Social History  . Marital status: Divorced    Spouse name: N/A  . Number of children: N/A  . Years of education: N/A   Occupational History  . Not on file.   Social History Main Topics  . Smoking status: Never Smoker  . Smokeless tobacco: Never Used  . Alcohol use No  . Drug use: No  . Sexual activity: Not on file   Other Topics Concern  . Not on  file   Social History Narrative  . No narrative on file   Past Surgical History:  Procedure Laterality Date  . ABDOMINAL HYSTERECTOMY  2011  . ANTERIOR CRUCIATE LIGAMENT REPAIR  2003  . BREAST BIOPSY Left January 22, 2012   left breast stereotactic biopsy showed fibroadenomatous changes with microcalcifications, fat necrosis, and sclerosing adenosis and columnar cell changes. No evidence of atypia or malignancy.  Marland Kitchen CARDIOVERSION N/A 09/22/2016   Procedure: CARDIOVERSION;  Surgeon: Dalia Heading, MD;  Location: [facility] ORS;  Service:  Cardiovascular;  Laterality: N/A;  . CESAREAN SECTION  1985, 1987, and 1998  . COLONOSCOPY  2011   Dr. Mechele Collin  . ELECTROPHYSIOLOGIC STUDY N/A 12/03/2014   Procedure: Cardioversion;  Surgeon: Dalia Heading, MD;  Location: [facility] ORS;  Service: Cardiovascular;  Laterality: N/A;  . ELECTROPHYSIOLOGIC STUDY N/A 04/01/2015   Procedure: Cardioversion;  Surgeon: Dalia Heading, MD;  Location: [facility] ORS;  Service: Cardiovascular;  Laterality: N/A;  . KNEE SURGERY  2013   Family History  Problem Relation Age of Onset  . Colon cancer Mother        diagnosis age 92's  . Colon cancer Sister        diagnosis age 72  . Atrial fibrillation Sister   . Breast cancer Other        Maternal Grandmother; diagnosis age 76's  . Lung cancer Other        Paternal Grandmother; diagnosis age 64's  . Colon cancer Other        Paternal Grandfather; diagnosis age 17's  . Heart disease Father    No Known Allergies     Assessment & Plan:  Presents as a new patient referred by NP Kyung Rudd for "recurrent cellulitis". Patient endorses a history of being diagnosed with bilateral lower extremity cellulitis since the beginning of the summer. Patient states she has always noticed "swelling" to the bilateral lower extremity however feels it has worsened over the last couple months. Patient notices swelling is worse towards the end of the day. The swelling is associated with discomfort. The recurrent cellulitis and discomfort prompted the patient to seek medical attention. Patient denies any surgery or trauma to the lower extremity. Patient denies any DVT to the lower extremity. At this time, the patient does not engage in any conservative therapy. Patient denies any fever, nausea or vomiting. Patient is currently on a course of Keflex for cellulitis.  1. Recurrent cellulitis of lower extremity - New We discussed Unna boot therapy however at this time, the patient is denying and will like to try contemporary compression  stockings. The patient was encouraged to wear graduated compression stockings (20-30 mmHg) on a daily basis. The patient was instructed to begin wearing the stockings first thing in the morning and removing them in the evening. The patient was instructed specifically not to sleep in the stockings. Prescription given. In addition, behavioral modification including elevation during the day will be initiated. Anti-inflammatories for pain. Agent follow-up in 1 month to assess conservative therapy and possibly add a lymphedema pump as an added therapy. The patient will follow up in three months to asses conservative management nd undergo a bilateral venous duplex to rule out reflux.  Information on compression stockings was given to the patient. The patient was instructed to call the office in the interim if any worsening edema or ulcerations to the legs, feet or toes occurs. The patient expresses their understanding.  - VAS [place] LOWER EXTREMITY VENOUS REFLUX; Future  2.  BMI 60.0-69.9, adult (HCC) - Stable This is a shipping factor to the patient's lower extremity edema and discomfort  Current Outpatient Prescriptions on File Prior to Visit  Medication Sig Dispense Refill  . amoxicillin-clavulanate (AUGMENTIN) 875-125 MG tablet Take 1 tablet by mouth 2 (two) times daily. 20 tablet 0  . cyanocobalamin ([employer] VITAMIN B12) 2000 MCG tablet Take 2,000 mcg by mouth daily after breakfast.    . diclofenac sodium (VOLTAREN) 1 % GEL Apply 4 g topically 4 (four) times daily. 300 g 1  . diltiazem (CARDIZEM CD) 240 MG 24 hr capsule Take 240 mg by mouth daily after breakfast.    . doxycycline (VIBRA-TABS) 100 MG tablet Take 1 tablet (100 mg total) by mouth 2 (two) times daily. 20 tablet 0  . furosemide (LASIX) 20 MG tablet Take 20 mg by mouth daily.  3  . levothyroxine (SYNTHROID, LEVOTHROID) 75 MCG tablet Take 1 tablet (75 mcg total) by mouth daily before breakfast. 90 tablet 2  . Liraglutide -Weight Management  (SAXENDA) 18 MG/3ML SOPN Inject 0.6 mg into the skin daily. For 1 week.  Then increase by 0.6 mg each week until you have reached a max of 3.0 mg once daily. 15 mL 5  . naproxen sodium (ANAPROX) 220 MG tablet Take 220 mg by mouth 2 (two) times daily.     . potassium chloride SA (K-DUR,KLOR-CON) 20 MEQ tablet Take 1 tablet (20 mEq total) by mouth daily. 30 tablet 6  . dofetilide (TIKOSYN) 500 MCG capsule Take 500 mcg by mouth 2 (two) times daily.      No current facility-administered medications on file prior to visit.     There are no Patient Instructions on file for this visit. No Follow-up on file.   Kanasia Gayman A Gittel Mccamish, PA-C

## 2017-01-30 ENCOUNTER — Other Ambulatory Visit: Payer: Self-pay | Admitting: Nurse Practitioner

## 2017-02-26 ENCOUNTER — Encounter (INDEPENDENT_AMBULATORY_CARE_PROVIDER_SITE_OTHER): Payer: Self-pay | Admitting: Vascular Surgery

## 2017-02-26 ENCOUNTER — Ambulatory Visit (INDEPENDENT_AMBULATORY_CARE_PROVIDER_SITE_OTHER): Payer: BLUE CROSS/BLUE SHIELD | Admitting: Vascular Surgery

## 2017-02-26 VITALS — Resp 19 | Ht 62.0 in | Wt 347.0 lb

## 2017-02-26 DIAGNOSIS — L03119 Cellulitis of unspecified part of limb: Secondary | ICD-10-CM | POA: Diagnosis not present

## 2017-02-26 DIAGNOSIS — I890 Lymphedema, not elsewhere classified: Secondary | ICD-10-CM

## 2017-02-26 DIAGNOSIS — Z6841 Body Mass Index (BMI) 40.0 and over, adult: Secondary | ICD-10-CM | POA: Diagnosis not present

## 2017-02-26 NOTE — Progress Notes (Signed)
Subjective:    [name], female    DOB: 1960-05-30, 56 y.o.   MRN: [id number] Chief Complaint  Patient presents with  . Follow-up    1 month no studies   Patient presents for a month follow-up.  Since our initial visit, the patient has been engaging in conservative therapy including wearing medical grade 1 compression stockings, elevating her legs and remaining active.  It is an improvement in the bilateral lower extremity swelling.  She is very pleased with this improvement.  The patient has not experienced any recurrent/new episodes of cellulitis.  We discussed moving forward with the possibility of applying for a lymphedema pump.  At this time, the patient feels her symptoms are controlled with conservative therapy.  Patient states an overall improvement in her bilateral lower extremity.  Denies any fever, nausea vomiting.   Review of Systems  Constitutional: Negative.   HENT: Negative.   Eyes: Negative.   Respiratory: Negative.   Cardiovascular: Negative.   Gastrointestinal: Negative.   Endocrine: Negative.   Genitourinary: Negative.   Musculoskeletal: Negative.   Skin: Negative.   Allergic/Immunologic: Negative.   Neurological: Negative.   Hematological: Negative.   Psychiatric/Behavioral: Negative.       Objective:   Physical Exam  Constitutional: She is oriented to person, place, and time. She appears well-developed and well-nourished. No distress.  HENT:  Head: Normocephalic and atraumatic.  Eyes: Conjunctivae are normal. Pupils are equal, round, and reactive to light.  Neck: Normal range of motion.  Cardiovascular: Normal rate, regular rhythm, normal heart sounds and intact distal pulses.  Pulses:      Radial pulses are 2+ on the right side, and 2+ on the left side.  Hard to palpate pedal pulses due to body habitus however the bilateral feet are warm  Pulmonary/Chest: Effort normal and breath sounds normal.  Musculoskeletal: Normal range of motion.  She exhibits edema (Mild bilateral lower extremity edema noted).  Neurological: She is alert and oriented to person, place, and time.  Skin: Skin is warm and dry. She is not diaphoretic.  Psychiatric: She has a normal mood and affect. Her behavior is normal. Judgment and thought content normal.  Vitals reviewed.  Resp 19   Ht 5\' 2"  (1.575 m)   Wt (!) 347 lb (157.4 kg)   BMI 63.47 kg/m   Past Medical History:  Diagnosis Date  . Abnormal mammogram, unspecified 2013  . Atrial fibrillation (HCC)    2013  . Breast screening, unspecified   . Obesity, unspecified   . Screening for obesity   . Special screening for malignant neoplasms, colon   . Unspecified essential hypertension    Social History   Socioeconomic History  . Marital status: Divorced    Spouse name: Not on file  . Number of children: Not on file  . Years of education: Not on file  . Highest education level: Not on file  Social Needs  . Financial resource strain: Not on file  . Food insecurity - worry: Not on file  . Food insecurity - inability: Not on file  . Transportation needs - medical: Not on file  . Transportation needs - non-medical: Not on file  Occupational History  . Not on file  Tobacco Use  . Smoking status: Never Smoker  . Smokeless tobacco: Never Used  Substance and Sexual Activity  . Alcohol use: No  . Drug use: No  . Sexual activity: Not on file  Other Topics Concern  .  Not on file  Social History Narrative  . Not on file   Past Surgical History:  Procedure Laterality Date  . ABDOMINAL HYSTERECTOMY  2011  . ANTERIOR CRUCIATE LIGAMENT REPAIR  2003  . BREAST BIOPSY Left January 22, 2012   left breast stereotactic biopsy showed fibroadenomatous changes with microcalcifications, fat necrosis, and sclerosing adenosis and columnar cell changes. No evidence of atypia or malignancy.  Marland Kitchen CARDIOVERSION N/A 09/22/2016   Performed by Dalia Heading, MD at [facility] ORS  . Cardioversion N/A 04/01/2015    Performed by Dalia Heading, MD at [facility] ORS  . Cardioversion N/A 12/03/2014   Performed by Dalia Heading, MD at [facility] ORS  . CESAREAN SECTION  1985, 1987, and 1998  . COLONOSCOPY  2011   Dr. Mechele Collin  . KNEE SURGERY  2013   Family History  Problem Relation Age of Onset  . Colon cancer Mother        diagnosis age 67's  . Colon cancer Sister        diagnosis age 4  . Atrial fibrillation Sister   . Breast cancer Other        Maternal Grandmother; diagnosis age 50's  . Lung cancer Other        Paternal Grandmother; diagnosis age 56's  . Colon cancer Other        Paternal Grandfather; diagnosis age 55's  . Heart disease Father    No Known Allergies     Assessment & Plan:  Patient presents for a month follow-up.  Since our initial visit, the patient has been engaging in conservative therapy including wearing medical grade 1 compression stockings, elevating her legs and remaining active.  It is an improvement in the bilateral lower extremity swelling.  She is very pleased with this improvement.  The patient has not experienced any recurrent/new episodes of cellulitis.  We discussed moving forward with the possibility of applying for a lymphedema pump.  At this time, the patient feels her symptoms are controlled with conservative therapy.  Patient states an overall improvement in her bilateral lower extremity.  Denies any fever, nausea vomiting.  1. Lymphedema - Stable Patient presents with improvement to the bilateral lower extremity swelling and discomfort she was evaluated for during our initial visit approximately 1 month ago. The patient has been engaging in conservative therapy which has greatly improved her symptoms. We had a long discussion about possibly adding a lymphedema pump however at this time she feels her symptoms are being adequately controlled. The patient is to continue engaging in conservative therapy and will call the office on an as-needed basis if she would like to  pursue any additional therapies in the future.  2. Recurrent cellulitis of lower extremity - Stable Patient has not experienced any new occurrences of cellulitis to the lower extremity since engaging in conservative therapy.  3. BMI 60.0-69.9, adult (HCC) - Stable This is a contributing factor to the patient's lower extremity edema and swelling  Current Outpatient Medications on File Prior to Visit  Medication Sig Dispense Refill  . cyanocobalamin ([employer] VITAMIN B12) 2000 MCG tablet Take 2,000 mcg by mouth daily after breakfast.    . diclofenac sodium (VOLTAREN) 1 % GEL Apply 4 g topically 4 (four) times daily. 300 g 1  . diltiazem (CARDIZEM CD) 240 MG 24 hr capsule Take 240 mg by mouth daily after breakfast.    . dofetilide (TIKOSYN) 500 MCG capsule Take 500 mcg by mouth 2 (two) times daily.     Marland Kitchen  doxycycline (VIBRA-TABS) 100 MG tablet Take 1 tablet (100 mg total) by mouth 2 (two) times daily. 20 tablet 0  . furosemide (LASIX) 20 MG tablet TAKE 1 TABLET BY MOUTH EVERY DAY 30 tablet 5  . levothyroxine (SYNTHROID, LEVOTHROID) 75 MCG tablet Take 1 tablet (75 mcg total) by mouth daily before breakfast. 90 tablet 2  . Liraglutide -Weight Management (SAXENDA) 18 MG/3ML SOPN Inject 0.6 mg into the skin daily. For 1 week.  Then increase by 0.6 mg each week until you have reached a max of 3.0 mg once daily. 15 mL 5  . naproxen sodium (ANAPROX) 220 MG tablet Take 220 mg by mouth 2 (two) times daily.     . potassium chloride SA (K-DUR,KLOR-CON) 20 MEQ tablet Take 1 tablet (20 mEq total) by mouth daily. 30 tablet 6  . rivaroxaban (XARELTO) 20 MG TABS tablet Take by mouth.     No current facility-administered medications on file prior to visit.    There are no Patient Instructions on file for this visit. No Follow-up on file.  KIMBERLY A STEGMAYER, PA-C

## 2017-04-27 ENCOUNTER — Ambulatory Visit (INDEPENDENT_AMBULATORY_CARE_PROVIDER_SITE_OTHER): Payer: BLUE CROSS/BLUE SHIELD | Admitting: Vascular Surgery

## 2017-04-27 ENCOUNTER — Encounter (INDEPENDENT_AMBULATORY_CARE_PROVIDER_SITE_OTHER): Payer: BLUE CROSS/BLUE SHIELD

## 2017-05-21 ENCOUNTER — Other Ambulatory Visit: Payer: Self-pay | Admitting: Nurse Practitioner

## 2017-05-21 DIAGNOSIS — M170 Bilateral primary osteoarthritis of knee: Secondary | ICD-10-CM

## 2017-07-21 ENCOUNTER — Other Ambulatory Visit: Payer: Self-pay | Admitting: Nurse Practitioner

## 2017-10-13 ENCOUNTER — Other Ambulatory Visit: Payer: Self-pay | Admitting: Nurse Practitioner

## 2017-10-31 DIAGNOSIS — G4733 Obstructive sleep apnea (adult) (pediatric): Secondary | ICD-10-CM | POA: Diagnosis not present

## 2017-10-31 DIAGNOSIS — I480 Paroxysmal atrial fibrillation: Secondary | ICD-10-CM | POA: Diagnosis not present

## 2017-10-31 DIAGNOSIS — Z6841 Body Mass Index (BMI) 40.0 and over, adult: Secondary | ICD-10-CM | POA: Diagnosis not present

## 2017-10-31 DIAGNOSIS — R0602 Shortness of breath: Secondary | ICD-10-CM | POA: Diagnosis not present

## 2017-11-02 ENCOUNTER — Other Ambulatory Visit: Payer: Self-pay

## 2017-11-02 ENCOUNTER — Ambulatory Visit (INDEPENDENT_AMBULATORY_CARE_PROVIDER_SITE_OTHER): Payer: BLUE CROSS/BLUE SHIELD

## 2017-11-02 ENCOUNTER — Telehealth: Payer: Self-pay | Admitting: Nurse Practitioner

## 2017-11-02 VITALS — BP 149/86 | HR 62 | Temp 98.0°F | Resp 20 | Ht 62.0 in | Wt 352.0 lb

## 2017-11-02 DIAGNOSIS — J181 Lobar pneumonia, unspecified organism: Principal | ICD-10-CM

## 2017-11-02 DIAGNOSIS — J189 Pneumonia, unspecified organism: Secondary | ICD-10-CM

## 2017-11-02 MED ORDER — LIDOCAINE HCL (PF) 1 % IJ SOLN
2.0000 mL | Freq: Once | INTRAMUSCULAR | Status: AC
  Administered 2017-11-02: 2 mL via INTRADERMAL

## 2017-11-02 MED ORDER — DOXYCYCLINE HYCLATE 100 MG PO TABS: 100.0000 mg | ORAL_TABLET | Freq: Two times a day (BID) | ORAL | 0 refills | Status: DC

## 2017-11-02 MED ORDER — CEFTRIAXONE SODIUM 1 G IJ SOLR
1.0000 g | Freq: Once | INTRAMUSCULAR | Status: AC
  Administered 2017-11-02: 1 g via INTRAMUSCULAR

## 2017-11-02 NOTE — Telephone Encounter (Signed)
Azithromycin causes QT prolongation with Tikosyn and was not prescribed.  - START doxycycline 100 mg bid x 10 days.

## 2017-11-02 NOTE — Patient Instructions (Addendum)
Katherine Scott,   Thank you for coming in to clinic today.  1. START doxycycline 100 mg tablet twice daily for 10 days.  This will help clear your pneumonia.  Please schedule a follow-up appointment with [name], AGNP. Return 7-10 days if symptoms worsen or fail to improve.  If you have any other questions or concerns, please feel free to call the clinic or send a message through MyChart. You may also schedule an earlier appointment if necessary.  You will receive a survey after today's visit either digitally by e-mail or paper by [employer] mail. Your experiences and feedback matter to us.  Please respond so we know how we are doing as we provide care for you.   Wilhelmina McardleLauren Jacob Chamblee, DNP, AGNP-BC Adult Gerontology Nurse Practitioner [facility], [facility]

## 2017-11-02 NOTE — Telephone Encounter (Signed)
[facility] presence of CHF exacerbation with pneumonia.  Deferred pneumonia management to me.  Patient is symptomatic with intermittent fever and chills. - START azithromycin 250 mg tablet. Take 2 tablets today.  Then, take 1 tablet daily for the next 4 days and stop. **Administer Rocephin 1 g IM today in clinic.  Patient plans to arrive around 8:30.

## 2017-12-04 ENCOUNTER — Ambulatory Visit: Admit: 2017-12-04 | Payer: BLUE CROSS/BLUE SHIELD | Admitting: Cardiology

## 2017-12-04 SURGERY — CARDIOVERSION (CATH LAB)
Anesthesia: General

## 2018-01-09 DIAGNOSIS — I480 Paroxysmal atrial fibrillation: Secondary | ICD-10-CM | POA: Diagnosis not present

## 2018-01-17 ENCOUNTER — Other Ambulatory Visit: Payer: Self-pay | Admitting: Cardiology

## 2018-01-17 ENCOUNTER — Encounter
Admission: RE | Disposition: A | Discharge status: Home / Self Care | Payer: Self-pay | Source: Ambulatory Visit | Attending: Cardiology

## 2018-01-17 ENCOUNTER — Ambulatory Visit: Payer: BLUE CROSS/BLUE SHIELD | Admitting: Certified Registered"

## 2018-01-17 ENCOUNTER — Ambulatory Visit
Admission: RE | Admit: 2018-01-17 | Discharge: 2018-01-17 | Disposition: A | Discharge status: Home / Self Care | Payer: BLUE CROSS/BLUE SHIELD | Source: Ambulatory Visit | Attending: Cardiology | Admitting: Cardiology

## 2018-01-17 DIAGNOSIS — G4730 Sleep apnea, unspecified: Secondary | ICD-10-CM | POA: Diagnosis not present

## 2018-01-17 DIAGNOSIS — Z7901 Long term (current) use of anticoagulants: Secondary | ICD-10-CM | POA: Insufficient documentation

## 2018-01-17 DIAGNOSIS — E079 Disorder of thyroid, unspecified: Secondary | ICD-10-CM | POA: Insufficient documentation

## 2018-01-17 DIAGNOSIS — E039 Hypothyroidism, unspecified: Secondary | ICD-10-CM | POA: Diagnosis not present

## 2018-01-17 DIAGNOSIS — I4891 Unspecified atrial fibrillation: Secondary | ICD-10-CM | POA: Diagnosis not present

## 2018-01-17 DIAGNOSIS — I10 Essential (primary) hypertension: Secondary | ICD-10-CM | POA: Diagnosis not present

## 2018-01-17 DIAGNOSIS — E785 Hyperlipidemia, unspecified: Secondary | ICD-10-CM | POA: Insufficient documentation

## 2018-01-17 DIAGNOSIS — Z79899 Other long term (current) drug therapy: Secondary | ICD-10-CM | POA: Diagnosis not present

## 2018-01-17 DIAGNOSIS — I480 Paroxysmal atrial fibrillation: Secondary | ICD-10-CM | POA: Diagnosis not present

## 2018-01-17 DIAGNOSIS — Z6841 Body Mass Index (BMI) 40.0 and over, adult: Secondary | ICD-10-CM | POA: Insufficient documentation

## 2018-01-17 HISTORY — PX: CARDIOVERSION: EP1203

## 2018-01-17 SURGERY — CARDIOVERSION (CATH LAB)
Anesthesia: General

## 2018-01-17 MED ORDER — SODIUM CHLORIDE 0.9 % IV SOLN: 250.0000 mL | INTRAVENOUS | Status: DC

## 2018-01-17 MED ORDER — SODIUM CHLORIDE 0.9% FLUSH: 3.0000 mL | INTRAVENOUS | Status: DC | PRN

## 2018-01-17 MED ORDER — SODIUM CHLORIDE 0.9% FLUSH: 3.0000 mL | Freq: Two times a day (BID) | INTRAVENOUS | Status: DC

## 2018-01-17 MED ORDER — PROPOFOL 10 MG/ML IV BOLUS
INTRAVENOUS | Status: DC | PRN
  Administered 2018-01-17: 70 mg via INTRAVENOUS

## 2018-01-17 MED ORDER — PROPOFOL 10 MG/ML IV BOLUS
INTRAVENOUS | Status: AC
  Filled 2018-01-17: qty 20

## 2018-01-17 NOTE — Anesthesia Post-op Follow-up Note (Signed)
Anesthesia QCDR form completed.        

## 2018-01-17 NOTE — Transfer of Care (Signed)
Immediate Anesthesia Transfer of Care Note  Patient: Katherine Scott  Procedure(s) Performed: CARDIOVERSION (N/A )  Patient Location: PACU  Anesthesia Type:General  Level of Consciousness: drowsy and responds to stimulation  Airway & Oxygen Therapy: Patient Spontanous Breathing and Patient connected to nasal cannula oxygen  Post-op Assessment: Report given to RN and Post -op Vital signs reviewed and stable  Post vital signs: Reviewed and stable  Last Vitals:  Vitals Value Taken Time  BP 145/80 01/17/2018  7:40 AM  Temp    Pulse 78 01/17/2018  7:41 AM  Resp 19 01/17/2018  7:41 AM  SpO2 91 % 01/17/2018  7:41 AM  Vitals shown include unvalidated device data.  Last Pain:  Vitals:   01/17/18 0634  TempSrc: Oral  PainSc: 7          Complications: No apparent anesthesia complications

## 2018-01-17 NOTE — Anesthesia Preprocedure Evaluation (Signed)
Anesthesia Evaluation  Patient identified by MRN, date of birth, ID band Patient awake    Reviewed: Allergy & Precautions, NPO status , Patient's Chart, lab work & pertinent test results, reviewed documented beta blocker date and time   History of Anesthesia Complications Negative for: history of anesthetic complications  Airway Mallampati: II  TM Distance: >3 FB     Dental  (+) Chipped   Pulmonary           Cardiovascular hypertension, Pt. on medications + dysrhythmias Atrial Fibrillation      Neuro/Psych    GI/Hepatic Neg liver ROS, Morbid obesity   Endo/Other  Hypothyroidism Morbid obesity  Renal/GU   negative genitourinary   Musculoskeletal negative musculoskeletal ROS (+)   Abdominal (+) + obese,   Peds negative pediatric ROS (+)  Hematology   Anesthesia Other Findings Past Medical History: 2013: Abnormal mammogram, unspecified No date: Atrial fibrillation [facility])     Comment:  2013 No date: Breast screening, unspecified No date: Obesity, unspecified No date: Screening for obesity No date: Special screening for malignant neoplasms, colon No date: Unspecified essential hypertension   Reproductive/Obstetrics                             Anesthesia Physical  Anesthesia Plan  ASA: III  Anesthesia Plan: General   Post-op Pain Management:    Induction: Intravenous  PONV Risk Score and Plan: TIVA  Airway Management Planned: Nasal Cannula and Natural Airway  Additional Equipment:   Intra-op Plan:   Post-operative Plan:   Informed Consent: I have reviewed the patients History and Physical, chart, labs and discussed the procedure including the risks, benefits and alternatives for the proposed anesthesia with the patient or authorized representative who has indicated his/her understanding and acceptance.     Plan Discussed with: CRNA  Anesthesia Plan Comments:          Anesthesia Quick Evaluation

## 2018-01-17 NOTE — Discharge Instructions (Signed)
Moderate Conscious Sedation, Adult, Care After °These instructions provide you with information about caring for yourself after your procedure. Your health care provider may also give you more specific instructions. Your treatment has been planned according to current medical practices, but problems sometimes occur. Call your health care provider if you have any problems or questions after your procedure. °What can I expect after the procedure? °After your procedure, it is common: °· To feel sleepy for several hours. °· To feel clumsy and have poor balance for several hours. °· To have poor judgment for several hours. °· To vomit if you eat too soon. ° °Follow these instructions at home: °For at least 24 hours after the procedure: ° °· Do not: °? Participate in activities where you could fall or become injured. °? Drive. °? Use heavy machinery. °? Drink alcohol. °? Take sleeping pills or medicines that cause drowsiness. °? Make important decisions or sign legal documents. °? Take care of children on your own. °· Rest. °Eating and drinking °· Follow the diet recommended by your health care provider. °· If you vomit: °? Drink water, juice, or soup when you can drink without vomiting. °? Make sure you have little or no nausea before eating solid foods. °General instructions °· Have a responsible adult stay with you until you are awake and alert. °· Take over-the-counter and prescription medicines only as told by your health care provider. °· If you smoke, do not smoke without supervision. °· Keep all follow-up visits as told by your health care provider. This is important. °Contact a health care provider if: °· You keep feeling nauseous or you keep vomiting. °· You feel light-headed. °· You develop a rash. °· You have a fever. °Get help right away if: °· You have trouble breathing. °This information is not intended to replace advice given to you by your health care provider. Make sure you discuss any questions you have  with your health care provider. °Document Released: 01/15/2013 Document Revised: 08/30/2015 Document Reviewed: 07/17/2015 °Elsevier Interactive Patient Education © 2018 Elsevier Inc[employer] °Electrical Cardioversion, Care After °This sheet gives you information about how to care for yourself after your procedure. Your health care provider may also give you more specific instructions. If you have problems or questions, contact your health care provider. °What can I expect after the procedure? °After the procedure, it is common to have: °· Some redness on the skin where the shocks were given. ° °Follow these instructions at home: °· Do not drive for 24 hours if you were given a medicine to help you relax (sedative). °· Take over-the-counter and prescription medicines only as told by your health care provider. °· Ask your health care provider how to check your pulse. Check it often. °· Rest for 48 hours after the procedure or as told by your health care provider. °· Avoid or limit your caffeine use as told by your health care provider. °Contact a health care provider if: °· You feel like your heart is beating too quickly or your pulse is not regular. °· You have a serious muscle cramp that does not go away. °Get help right away if: °· You have discomfort in your chest. °· You are dizzy or you feel faint. °· You have trouble breathing or you are short of breath. °· Your speech is slurred. °· You have trouble moving an arm or leg on one side of your body. °· Your fingers or toes turn cold or blue. °This information is not intended   to replace advice given to you by your health care provider. Make sure you discuss any questions you have with your health care provider. °Document Released: 01/15/2013 Document Revised: 10/29/2015 Document Reviewed: 10/01/2015 °Elsevier Interactive Patient Education © 2018 Elsevier Inc. ° °

## 2018-01-17 NOTE — Procedures (Signed)
Electrical Cardioversion Procedure Note Katherine Scott [id number] [birth year]  Procedure: Electrical Cardioversion Indications:  Atrial Fibrillation  Procedure Details Consent: Risks of procedure as well as the alternatives and risks of each were explained to the (patient/caregiver).  Consent for procedure obtained. Time Out: Verified patient identification, verified procedure, site/side was marked, verified correct patient position, special equipment/implants available, medications/allergies/relevent history reviewed, required imaging and test results available.  Performed  Patient placed on cardiac monitor, pulse oximetry, supplemental oxygen as necessary.  Sedation given: propofol Pacer pads placed anterior and posterior chest.  Cardioverted 1 time(s).  Cardioverted at 120J.  Evaluation Findings: Post procedure EKG shows: NSR Complications: None Patient did tolerate procedure well.   Dalia Heading 01/17/2018, 7:43 AM

## 2018-01-17 NOTE — Anesthesia Postprocedure Evaluation (Signed)
Anesthesia Post Note  Patient: Katherine Scott  Procedure(s) Performed: CARDIOVERSION (N/A )  Patient location during evaluation: Cath Lab Anesthesia Type: General Level of consciousness: awake and alert Pain management: pain level controlled Vital Signs Assessment: post-procedure vital signs reviewed and stable Respiratory status: spontaneous breathing, nonlabored ventilation, respiratory function stable and patient connected to nasal cannula oxygen Cardiovascular status: blood pressure returned to baseline and stable Postop Assessment: no apparent nausea or vomiting Anesthetic complications: no     Last Vitals:  Vitals:   01/17/18 0800 01/17/18 0815  BP: (!) 167/91   Pulse: 80 80  Resp: 18 (!) 25  Temp:    SpO2: 93% 93%    Last Pain:  Vitals:   01/17/18 0800  TempSrc:   PainSc: 0-No pain                 Lenard Simmer

## 2018-01-17 NOTE — H&P (Signed)
Chief Complaint: Chief Complaint  Patient presents with  . Follow-up  . Atrial Fibrillation  . Shortness of Breath  Date of Service: 01/09/2018 Date of Birth: 02-19-1961 PCP: Janeann Forehand., MD  History of Present Illness: Ms. Kluver is a 57 y.o.female patient who returns for follow-up visit. Patient remains in atrial fibrillation. She has been compliant with her dofetilide . She states she is compliant with her CPAP. Patient has been back on Xarelto for greater than 4 weeks. She was no infection. This is been treated. She would like to proceed with cardioversion. Benefits of this procedure as well as the risk were discussed with her. Past Medical and Surgical History  Past Medical History Past Medical History:  Diagnosis Date  . Arthritis  . Atrial fibrillation (CMS-HCC)  . Hyperlipidemia  . Hypertension  . Obesity  . Sleep apnea  . Thyroid disease   Past Surgical History She has a past surgical history that includes Abdominal hysterectomy; egd (04/08/2002); Colonoscopy (04/08/2002); and Colonoscopy (02/18/2010).   Medications and Allergies  Current Medications  Current Outpatient Medications  Medication Sig Dispense Refill  . apixaban (ELIQUIS) 5 mg tablet Take 1 tablet (5 mg total) by mouth 2 (two) times daily 60 tablet 11  . cyanocobalamin (VITAMIN B12) 1000 MCG tablet Take 1,000 mcg by mouth once daily.  Marland Kitchen diltiazem (CARDIZEM CD) 240 MG CD capsule TAKE 1 CAPSULE (240 MG TOTAL) BY MOUTH ONCE DAILY. 90 capsule 2  . dofetilide (TIKOSYN) 500 MCG capsule TAKE 1 CAPSULE (500 MCG TOTAL) BY MOUTH EVERY 12 (TWELVE) HOURS. NEED APPT. 60 capsule 5  . FUROsemide (LASIX) 40 MG tablet Take 1 tablet (40 mg total) by mouth once daily 30 tablet 5  . KLOR-CON M20 20 mEq ER tablet TAKE 1 TABLET (20 MEQ TOTAL) BY MOUTH ONCE DAILY. 90 tablet 1  . levothyroxine (SYNTHROID, LEVOTHROID) 75 MCG tablet TAKE 1 TABLET (75 MCG TOTAL) BY MOUTH DAILY BEFORE BREAKFAST. 2  . naproxen sodium (ALEVE,  ANAPROX) 220 MG tablet Take 220 mg by mouth 2 (two) times daily with meals.   No current facility-administered medications for this visit.   Allergies: Patient has no known allergies.  Social and Family History  Social History reports that she has never smoked. She has never used smokeless tobacco. She reports that she drinks alcohol. She reports that she does not use drugs.  Family History Family History  Problem Relation Age of Onset  . Arthritis Mother  . Colon cancer Mother  . Heart disease Father   Review of Systems  Review of Systems  Constitutional: Negative for chills, diaphoresis, fever, malaise/fatigue and weight loss.  HENT: Positive for congestion. Negative for ear discharge, hearing loss and tinnitus.  Eyes: Negative for blurred vision.  Respiratory: Positive for shortness of breath. Negative for cough, hemoptysis, sputum production and wheezing.  Cardiovascular: Positive for palpitations. Negative for chest pain, orthopnea, claudication, leg swelling and PND.  Gastrointestinal: Negative for abdominal pain, blood in stool, constipation, diarrhea, heartburn, melena, nausea and vomiting.  Genitourinary: Negative for dysuria, frequency, hematuria and urgency.  Musculoskeletal: Negative for back pain, falls, joint pain and myalgias.  Skin: Negative for itching and rash.  Neurological: Negative for dizziness, tingling, focal weakness, loss of consciousness, weakness and headaches.  Endo/Heme/Allergies: Negative for polydipsia. Does not bruise/bleed easily.  Psychiatric/Behavioral: Negative for depression, memory loss and substance abuse. The patient is not nervous/anxious.    Physical Examination   Vitals: BP 148/82  Pulse (!) 111  Ht 157.5  cm (5\' 2" )  Wt (!) 157.6 kg (347 lb 7.1 oz)  LMP (LMP Unknown)  SpO2 95%  BMI 63.55 kg/m  Ht:157.5 cm (5\' 2" ) Wt:(!) 157.6 kg (347 lb 7.1 oz) ZOX:WRUE surface area is 2.63 meters squared. Body mass index is 63.55 kg/m.  Wt  Readings from Last 3 Encounters:  01/09/18 (!) 157.6 kg (347 lb 7.1 oz)  10/31/17 (!) 159.7 kg (352 lb 1.2 oz)  08/29/16 (!) 158.1 kg (348 lb 9.6 oz)   BP Readings from Last 3 Encounters:  01/09/18 148/82  10/31/17 (!) 154/92  08/29/16 122/70   General appearance appears in no acute distress  Head Mouth and Eye exam Normocephalic, without obvious abnormality, atraumatic Dentition is good Eyes appear anicteric   LUNGS Breath Sounds: Normal Percussion: Normal  CARDIOVASCULAR JVP CV wave: no HJR: no Elevation at 90 degrees: None Carotid Pulse: normal pulsation bilaterally Bruit: None Apex: apical impulse normal  Auscultation Rhythm: atrial fibrillation S1: normal S2: normal Clicks: no Rub: no Murmurs: no murmurs  Gallop: None Abdomen-difficult to assess due to body habitus.  EXTREMITIES Clubbing: no Edema: trace to 1+ bilateral pedal edema Pulses: peripheral pulses symmetrical Femoral Bruits: no Amputation: no SKIN Rash: no Cyanosis: no Embolic phemonenon: no Bruising: no NEURO Alert and Oriented to person, place and time: yes Non focal: yes  PSYCH: Pt appears to have normal affect  LABS REVIEWED  Lab Results  Component Value Date  CREATININE 0.9 01/09/2018  BUN 17 01/09/2018  NA 144 01/09/2018  K 3.7 01/09/2018  CL 100 01/09/2018  CO2 30.9 01/09/2018   Assessment and Plan   57 y.o. female with  ICD-10-CM ICD-9-CM  1. Atrial fibrillation, unspecified type-patient has had recurrent atrial fibrillation. This is likely secondary to despite using CPAP. She has been on her dofetilide and has been back on Xarelto for greater than 4 weeks. Will proceed with re-cardioverting. Weight loss is going to be essential for long-term maintenance of sinus rhythm along with CPAP use. Check electrolytes prior to scheduling. I48.91 427.31   2. Paroxysmal atrial fibrillation-as per above I48.0 427.31   No follow-ups on file.  These notes generated with voice  recognition software. I apologize for typographical errors.  Denton Ar, MD    Pt seen and examined. No change from above.

## 2018-01-24 DIAGNOSIS — I480 Paroxysmal atrial fibrillation: Secondary | ICD-10-CM | POA: Diagnosis not present

## 2018-01-24 DIAGNOSIS — G4733 Obstructive sleep apnea (adult) (pediatric): Secondary | ICD-10-CM | POA: Diagnosis not present

## 2018-01-24 DIAGNOSIS — Z6841 Body Mass Index (BMI) 40.0 and over, adult: Secondary | ICD-10-CM | POA: Diagnosis not present

## 2018-01-25 ENCOUNTER — Telehealth: Payer: Self-pay | Admitting: Family Medicine

## 2018-01-25 NOTE — Telephone Encounter (Signed)
Covering inbox for Katherine Scott, AGPCNP-BC while she is out of office.  Patient called this afternoon Friday 01/25/18 after 3-4pm, call taken by [facility] staff and patient requested antibiotics, unclear exact etiology. She has been treated for cellulitis in past 2018. Also, recently past 3 months in 10/2017 treated with Doxycycline for CAP.  Patient was advised to seek medical care at Urgent Care or Hospital ED if significant worsening, as this late afternoon we do not have any available appointments, and would request that patient be evaluated in office instead of sending rx antibiotics by phone.  Katherine Pilar, DO [facility] 01/25/2018, 6:21 PM

## 2018-03-21 ENCOUNTER — Other Ambulatory Visit: Payer: Self-pay | Admitting: Nurse Practitioner

## 2018-03-21 DIAGNOSIS — E039 Hypothyroidism, unspecified: Secondary | ICD-10-CM

## 2018-04-21 ENCOUNTER — Other Ambulatory Visit: Payer: Self-pay | Admitting: Nurse Practitioner

## 2018-04-21 DIAGNOSIS — E039 Hypothyroidism, unspecified: Secondary | ICD-10-CM

## 2018-04-21 DIAGNOSIS — M170 Bilateral primary osteoarthritis of knee: Secondary | ICD-10-CM

## 2018-05-17 ENCOUNTER — Other Ambulatory Visit: Payer: Self-pay | Admitting: Nurse Practitioner

## 2018-05-17 DIAGNOSIS — E039 Hypothyroidism, unspecified: Secondary | ICD-10-CM

## 2018-05-24 ENCOUNTER — Encounter: Payer: Self-pay | Admitting: Nurse Practitioner

## 2018-05-24 ENCOUNTER — Ambulatory Visit (INDEPENDENT_AMBULATORY_CARE_PROVIDER_SITE_OTHER): Payer: BLUE CROSS/BLUE SHIELD | Admitting: Nurse Practitioner

## 2018-05-24 VITALS — BP 177/66 | HR 77 | Temp 99.3°F | Resp 20 | Ht 62.0 in | Wt 341.5 lb

## 2018-05-24 DIAGNOSIS — J40 Bronchitis, not specified as acute or chronic: Secondary | ICD-10-CM

## 2018-05-24 DIAGNOSIS — R6889 Other general symptoms and signs: Secondary | ICD-10-CM | POA: Diagnosis not present

## 2018-05-24 MED ORDER — SPACER/AERO-HOLDING CHAMBERS DEVI: 1.0000 | 0 refills | Status: AC | PRN

## 2018-05-24 MED ORDER — ALBUTEROL SULFATE HFA 108 (90 BASE) MCG/ACT IN AERS: 2.0000 | INHALATION_SPRAY | Freq: Four times a day (QID) | RESPIRATORY_TRACT | 2 refills | Status: DC | PRN

## 2018-05-24 MED ORDER — OSELTAMIVIR PHOSPHATE 75 MG PO CAPS: 75.0000 mg | ORAL_CAPSULE | Freq: Two times a day (BID) | ORAL | 0 refills | Status: AC

## 2018-05-24 MED ORDER — BENZONATATE 100 MG PO CAPS: 100.0000 mg | ORAL_CAPSULE | Freq: Two times a day (BID) | ORAL | 0 refills | Status: DC | PRN

## 2018-05-24 MED ORDER — BALOXAVIR MARBOXIL(80 MG DOSE) 2 X 40 MG PO TBPK: 80.0000 mg | ORAL_TABLET | Freq: Once | ORAL | 0 refills | Status: AC

## 2018-05-24 NOTE — Patient Instructions (Addendum)
Katherine Scott,   Thank you for coming in to clinic today.  1. It sounds like you have a Upper Respiratory Virus - this will most likely run it's course in 7 to 10 days. Recommend good hand washing.  - Take Xofluza if affordable.  Take 2 pills in one dose today. - If not affordable, Start Tamiflu 75mg  capsules twice daily x 5 days.  - If congestion is worse, start OTC Mucinex (or may try Mucinex-DM for cough) up to 7-10 days then stop - May also take benzonatate (Tessalon) 100 mg one capsule twice daily as needed for cough. - AVOID all decongestants. - Drink plenty of fluids to improve congestion - You may try over the counter Nasal Saline spray (Simply Saline, Ocean Spray) as needed to reduce congestion. - Drink warm herbal tea with honey for sore throat. - Start taking Tylenol extra strength 1 to 2 tablets every 6-8 hours for aches or fever/chills for next few days as needed.  Do not take more than 3,000 mg in 24 hours from all medicines.  May take Ibuprofen as well if tolerated 200-400mg  every 8 hours as needed.  If symptoms significantly worsening with persistent fevers/chills despite tylenol/ibpurofen, nausea, vomiting unable to tolerate food/fluids or medicine, body aches, or shortness of breath, sinus pain pressure or worsening productive cough, then follow-up for re-evaluation, may seek more immediate care at Urgent Care or ED if more concerned for emergency.  Please schedule a follow-up appointment with Katherine Scott, AGNP. Return 5-7 days if symptoms worsen or fail to improve.  If you have any other questions or concerns, please feel free to call the clinic or send a message through MyChart. You may also schedule an earlier appointment if necessary.  You will receive a survey after today's visit either digitally by e-mail or paper by [employer]. Your experiences and feedback matter to [place].  Please respond so we know how we are doing as we provide care for you.   Katherine Mcardle, DNP,  AGNP-BC Adult Gerontology Nurse Practitioner [facility]

## 2018-05-24 NOTE — Progress Notes (Signed)
Subjective:    [name], female    DOB: 1960/10/06, 58 y.o.   MRN: [id number]  Katherine Scott is a 58 y.o. female presenting on 05/24/2018 for Influenza (chest congestion, coughing SOB, bodyaches, fever, headache., bilateral ear pain and chills x 1 days )   HPI Flu-like symptoms Patient presents with flu-like symptoms present x 1 day.  Head pressure, ear pain, cough with difficulty breathing/tightness.  Increased head pressure with coughing.  Chills and sweats.  Presumed fever.  Started sneezing Wed night.  Patient took cold/flu medicine with mild to no relief.  Had  - No known positive flu exposure, but works with public.     Social History   Tobacco Use  . Smoking status: Never Smoker  . Smokeless tobacco: Never Used  Substance Use Topics  . Alcohol use: No  . Drug use: No    Review of Systems Per HPI unless specifically indicated above     Objective:    BP (!) 177/66 (BP Location: Left Arm, Patient Position: Sitting, Cuff Size: Large)   Pulse 77   Temp 99.3 F (37.4 C) (Oral)   Resp 20   Ht 5\' 2"  (1.575 m)   Wt (!) 341 lb 8 oz (154.9 kg)   SpO2 95%   BMI 62.46 kg/m   Wt Readings from Last 3 Encounters:  05/24/18 (!) 341 lb 8 oz (154.9 kg)  01/17/18 (!) 352 lb 11.8 oz (160 kg)  11/02/17 (!) 352 lb (159.7 kg)    Physical Exam Vitals signs reviewed.  Constitutional:      General: She is not in acute distress.    Appearance: She is well-developed. She is morbidly obese. She is ill-appearing and diaphoretic. She is not toxic-appearing.  HENT:     Head: Normocephalic and atraumatic.     Right Ear: Tympanic membrane, ear canal and external ear normal.     Left Ear: Tympanic membrane, ear canal and external ear normal.     Nose: Congestion present. No rhinorrhea.     Right Turbinates: Swollen.     Left Turbinates: Swollen.     Right Sinus: Maxillary sinus tenderness and frontal sinus tenderness present.     Left Sinus: Maxillary sinus tenderness and  frontal sinus tenderness present.     Mouth/Throat:     Mouth: Mucous membranes are moist.     Pharynx: Oropharynx is clear. Posterior oropharyngeal erythema (mildy injected) present. No pharyngeal swelling, oropharyngeal exudate or uvula swelling.  Eyes:     Conjunctiva/sclera: Conjunctivae normal.     Pupils: Pupils are equal, round, and reactive to light.  Pulmonary:     Effort: No accessory muscle usage.     Breath sounds: Decreased air movement present. Examination of the right-upper field reveals wheezing. Examination of the left-upper field reveals wheezing. Examination of the right-middle field reveals wheezing and rhonchi. Examination of the left-middle field reveals wheezing and rhonchi. Examination of the right-lower field reveals wheezing and rhonchi. Examination of the left-lower field reveals wheezing and rhonchi. Wheezing and rhonchi present. No decreased breath sounds or rales.  Skin:    General: Skin is warm.     Capillary Refill: Capillary refill takes less than 2 seconds.  Neurological:     General: No focal deficit present.     Mental Status: She is alert and oriented to person, place, and time. Mental status is at baseline.  Psychiatric:        Mood and Affect: Mood normal.  Behavior: Behavior normal. Behavior is cooperative.        Thought Content: Thought content normal.        Judgment: Judgment normal.    Results for orders placed or performed in visit on 01/16/17  TSH  Result Value Ref Range   TSH 3.55 0.40 - 4.50 mIU/L  Comprehensive metabolic panel  Result Value Ref Range   Glucose, Bld 107 65 - 139 mg/dL   BUN 16 7 - 25 mg/dL   Creat 1.910.90 4.780.50 - 2.951.05 mg/dL   BUN/Creatinine Ratio NOT APPLICABLE 6 - 22 (calc)   Sodium 142 135 - 146 mmol/L   Potassium 3.9 3.5 - 5.3 mmol/L   Chloride 102 98 - 110 mmol/L   CO2 31 20 - 32 mmol/L   Calcium 9.0 8.6 - 10.4 mg/dL   Total Protein 7.1 6.1 - 8.1 g/dL   Albumin 4.3 3.6 - 5.1 g/dL   Globulin 2.8 1.9 - 3.7  g/dL (calc)   AG Ratio 1.5 1.0 - 2.5 (calc)   Total Bilirubin 0.7 0.2 - 1.2 mg/dL   Alkaline phosphatase (APISO) 87 33 - 130 U/L   AST 17 10 - 35 U/L   ALT 21 6 - 29 U/L      Assessment & Plan:   Problem List Items Addressed This Visit    None    Visit Diagnoses    Bronchitis    -  Primary   Relevant Medications   Spacer/Aero-Holding Chambers DEVI   albuterol (PROVENTIL HFA;VENTOLIN HFA) 108 (90 Base) MCG/ACT inhaler   Flu-like symptoms       Relevant Medications   Spacer/Aero-Holding Chambers DEVI   albuterol (PROVENTIL HFA;VENTOLIN HFA) 108 (90 Base) MCG/ACT inhaler   oseltamivir (TAMIFLU) 75 MG capsule   Baloxavir Marboxil,80 MG Dose, (XOFLUZA) 2 x 40 MG TBPK   benzonatate (TESSALON) 100 MG capsule     Clinically diagnosed influenza.  No available rapid flu tests today, concern for flu still due to significant symptoms consistent with flu. - Duration x 1 days, with complication of chest tightness with cough/breathing consistent with bronchitis. - Tolerating PO and well hydrated - No other focal findings of infection today - Did not receive influenza vaccine this season  Plan: 1. Start Xofluza 80 mg x 1 dose today.  Copay card provided.  If not affordable, may take Tamiflu 75mg  capsules BID x 5 days instead. 2. Supportive care as advised with NSAID / Tylenol PRN fever/myalgias, improve hydration, may take OTC DM cough medications.  Avoid decongestants in cold and flu medications.  - Start benzonatate 100 mg capsules bid prn for cough 3. Return criteria given if significant worsening, consider post-influenza complications, otherwise follow-up if needed    Meds ordered this encounter  Medications  . Spacer/Aero-Holding Chambers DEVI    Sig: 1 Device by Does not apply route as needed.    Dispense:  1 each    Refill:  0    Order Specific Question:   Supervising Provider    Answer:   Smitty CordsKARAMALEGOS, ALEXANDER J [[id number]]  . albuterol (PROVENTIL HFA;VENTOLIN HFA) 108 (90 Base)  MCG/ACT inhaler    Sig: Inhale 2 puffs into the lungs every 6 (six) hours as needed for wheezing.    Dispense:  18 g    Refill:  2    Order Specific Question:   Supervising Provider    Answer:   Smitty CordsKARAMALEGOS, ALEXANDER J [[id number]]  . oseltamivir (TAMIFLU) 75 MG capsule    Sig: Take 1 capsule (  75 mg total) by mouth 2 (two) times daily for 5 days.    Dispense:  10 capsule    Refill:  0    FILL Tamiflu if Lasandra Beech is not affordable to patient.    Order Specific Question:   Supervising Provider    Answer:   Smitty Cords [[id number]]  . Baloxavir Marboxil,80 MG Dose, (XOFLUZA) 2 x 40 MG TBPK    Sig: Take 80 mg by mouth once for 1 dose.    Dispense:  2 each    Refill:  0    Order Specific Question:   Supervising Provider    Answer:   Smitty Cords [[id number]]  . benzonatate (TESSALON) 100 MG capsule    Sig: Take 1 capsule (100 mg total) by mouth 2 (two) times daily as needed for cough.    Dispense:  20 capsule    Refill:  0    Order Specific Question:   Supervising Provider    Answer:   Smitty Cords [[id number]]    Follow up plan: Return 5-7 days if symptoms worsen or fail to improve.  Wilhelmina Mcardle, DNP, AGPCNP-BC Adult Gerontology Primary Care Nurse Practitioner [facility] 05/24/2018, 2:30 PM

## 2018-05-27 ENCOUNTER — Telehealth: Payer: Self-pay | Admitting: Nurse Practitioner

## 2018-05-27 DIAGNOSIS — J40 Bronchitis, not specified as acute or chronic: Secondary | ICD-10-CM

## 2018-05-27 DIAGNOSIS — R6889 Other general symptoms and signs: Secondary | ICD-10-CM

## 2018-05-27 MED ORDER — HYDROCOD POLST-CPM POLST ER 10-8 MG/5ML PO SUER: 5.0000 mL | Freq: Every evening | ORAL | 0 refills | Status: DC | PRN

## 2018-05-27 NOTE — Telephone Encounter (Signed)
Sent Tussionex. Take only at bedtime for 5 nights max.

## 2018-05-27 NOTE — Telephone Encounter (Signed)
Pt called said that the tessalon pills was not working for her wanted to know if you call some else in  [facility]  [place]

## 2018-05-27 NOTE — Telephone Encounter (Signed)
The pt was notified. No questions or concerns. 

## 2018-05-28 ENCOUNTER — Other Ambulatory Visit: Payer: Self-pay | Admitting: Nurse Practitioner

## 2018-05-28 ENCOUNTER — Other Ambulatory Visit: Payer: Self-pay

## 2018-05-28 DIAGNOSIS — E039 Hypothyroidism, unspecified: Secondary | ICD-10-CM

## 2018-05-28 MED ORDER — LEVOTHYROXINE SODIUM 75 MCG PO TABS: 75.0000 ug | ORAL_TABLET | Freq: Every day | ORAL | 0 refills | Status: DC

## 2018-07-17 ENCOUNTER — Other Ambulatory Visit: Payer: Self-pay

## 2018-07-17 ENCOUNTER — Ambulatory Visit (INDEPENDENT_AMBULATORY_CARE_PROVIDER_SITE_OTHER): Payer: BLUE CROSS/BLUE SHIELD | Admitting: Nurse Practitioner

## 2018-07-17 ENCOUNTER — Encounter: Payer: Self-pay | Admitting: Nurse Practitioner

## 2018-07-17 DIAGNOSIS — L03116 Cellulitis of left lower limb: Secondary | ICD-10-CM

## 2018-07-17 DIAGNOSIS — L03115 Cellulitis of right lower limb: Secondary | ICD-10-CM

## 2018-07-17 DIAGNOSIS — I890 Lymphedema, not elsewhere classified: Secondary | ICD-10-CM

## 2018-07-17 MED ORDER — DOXYCYCLINE HYCLATE 100 MG PO TABS: 100.0000 mg | ORAL_TABLET | Freq: Two times a day (BID) | ORAL | 0 refills | Status: DC

## 2018-07-17 NOTE — Progress Notes (Signed)
Telemedicine Encounter: Disclosed to patient at start of encounter that we will provide appropriate telemedicine services.  Patient consents to be treated via phone prior to discussion. - Patient is at her home and is accessed via telephone. - Services are provided by Wilhelmina McardleLauren Marlena Barbato from [facility].   Subjective:    [name], female    DOB: Sep 23, 1960, 58 y.o.   MRN: [id number]   Katherine Scott is a 58 y.o. female presenting on 07/17/2018 for Leg Swelling (bilateral leg swelling similar to cellulitis in past)  HPI 2 years ago May had cellulitis in legs - Left leg today is still most bothersome. Is starting to have symptoms recently.   Legs are warm, red and are starting to hurt.  Legs bilaterally are more swollen than usual.   Tylenol and Advil are not improving pain.  - Patient has seen vascular.  Started wearing compression socks after vascular surgery.  Has stopped wearing since warmer weather.  This week is first symptoms.    Social History   Tobacco Use  . Smoking status: Never Smoker  . Smokeless tobacco: Never Used  Substance Use Topics  . Alcohol use: No  . Drug use: No   Review of Systems Per HPI unless specifically indicated above    Objective:    There were no vitals taken for this visit.  Wt Readings from Last 3 Encounters:  05/24/18 (!) 341 lb 8 oz (154.9 kg)  01/17/18 (!) 352 lb 11.8 oz (160 kg)  11/02/17 (!) 352 lb (159.7 kg)    Physical Exam Patient remotely monitored.  Verbal communication appropriate.  Cognition normal.  Results for orders placed or performed in visit on 01/16/17  TSH  Result Value Ref Range   TSH 3.55 0.40 - 4.50 mIU/L  Comprehensive metabolic panel  Result Value Ref Range   Glucose, Bld 107 65 - 139 mg/dL   BUN 16 7 - 25 mg/dL   Creat 7.820.90 9.560.50 - 2.131.05 mg/dL   BUN/Creatinine Ratio NOT APPLICABLE 6 - 22 (calc)   Sodium 142 135 - 146 mmol/L   Potassium 3.9 3.5 - 5.3 mmol/L   Chloride 102 98 - 110  mmol/L   CO2 31 20 - 32 mmol/L   Calcium 9.0 8.6 - 10.4 mg/dL   Total Protein 7.1 6.1 - 8.1 g/dL   Albumin 4.3 3.6 - 5.1 g/dL   Globulin 2.8 1.9 - 3.7 g/dL (calc)   AG Ratio 1.5 1.0 - 2.5 (calc)   Total Bilirubin 0.7 0.2 - 1.2 mg/dL   Alkaline phosphatase (APISO) 87 33 - 130 U/L   AST 17 10 - 35 U/L   ALT 21 6 - 29 U/L      Assessment & Plan:   Problem List Items Addressed This Visit      Other   Lymphedema    Other Visit Diagnoses    Cellulitis of left lower extremity    -  Primary   Relevant Medications   doxycycline (VIBRA-TABS) 100 MG tablet   Cellulitis of right lower extremity       Relevant Medications   doxycycline (VIBRA-TABS) 100 MG tablet      Patient with recurrent cellulitis in early stages of disease without weeping 2/2 inadequately controlled lymphedema.  Patient not wearing compression socks due to warmer weather.  Plan: 1. Encouraged ongoing, chronic use of compression socks. 2. START doxycycline 100 mg bid x 10 days.  3. Provided secondary precautions for not  improving, worsening to call clinic.   4. Follow-up prn 1-2 weeks if not improving.  Meds ordered this encounter  Medications  . doxycycline (VIBRA-TABS) 100 MG tablet    Sig: Take 1 tablet (100 mg total) by mouth 2 (two) times daily.    Dispense:  20 tablet    Refill:  0    Order Specific Question:   Supervising Provider    Answer:   Smitty Cords [[id number]]    - Time spent in direct consultation with patient via telemedicine about above concerns: 7 minutes  Follow up plan: Follow-up prn 1-2 weeks.  Wilhelmina Mcardle, DNP, AGPCNP-BC Adult Gerontology Primary Care Nurse Practitioner [facility] 07/17/2018, 1:38 PM

## 2018-08-13 ENCOUNTER — Ambulatory Visit (INDEPENDENT_AMBULATORY_CARE_PROVIDER_SITE_OTHER): Payer: BLUE CROSS/BLUE SHIELD | Admitting: Family Medicine

## 2018-08-13 ENCOUNTER — Other Ambulatory Visit: Payer: Self-pay

## 2018-08-13 ENCOUNTER — Encounter: Payer: Self-pay | Admitting: Family Medicine

## 2018-08-13 DIAGNOSIS — L03119 Cellulitis of unspecified part of limb: Secondary | ICD-10-CM | POA: Diagnosis not present

## 2018-08-13 MED ORDER — DOXYCYCLINE HYCLATE 100 MG PO TABS: 100.0000 mg | ORAL_TABLET | Freq: Two times a day (BID) | ORAL | 0 refills | Status: DC

## 2018-08-13 MED ORDER — CEPHALEXIN 500 MG PO CAPS: 500.0000 mg | ORAL_CAPSULE | Freq: Three times a day (TID) | ORAL | 0 refills | Status: DC

## 2018-08-13 NOTE — Progress Notes (Signed)
Virtual Visit via Telephone The purpose of this virtual visit is to provide medical care while limiting exposure to the novel coronavirus (COVID19) for both patient and office staff.  Consent was obtained for phone visit:  Yes.   Answered questions that patient had about telehealth interaction:  Yes.   I discussed the limitations, risks, security and privacy concerns of performing an evaluation and management service by telephone. I also discussed with the patient that there may be a patient responsible charge related to this service. The patient expressed understanding and agreed to proceed.  Patient Location: Home Provider Location: [facility] (Office)  PCP is Wilhelmina McardleLauren Kennedy, AGPCNP-BC - I am currently covering during her maternity leave.   ---------------------------------------------------------------------- Chief Complaint  Patient presents with  . Cellulitis    lower extremity Right side    S: Reviewed CMA documentation. I have called patient and gathered additional HPI as follows:  RIGHT LOWER EXTREMITY CELLULITIS / Stasis Dermatitis / Lymphedema Background history review: Has history of Lymphedema in lower extremities, she was treated last by [facility] back in 02/2017, was treated at that time for recurrent cellulitis and her PCP, had ultrasound of veins done that was negative, and treated with multiple antibiotics (doxycycline x 2, keflex, augmentin)  Reports that symptoms started few weeks ago developed Left lower extremity mostly leg only in shin area with cellulitis swelling and inflammation in lower extremity, she was treated by Wilhelmina McardleLauren Kennedy, AGPCNP-BC on 07/17/18 treated for LLE Cellulitis / Edema with Doxycycline 100mg  BID x 10 days and increased her furosemide 20 to 40mg  daily, she had done well with antibiotic and fluid pill, hard to tell if fluid pill helps her swelling but it is worse if does not take fluid pill. She has been doing elevation  now more regularly - she had not used her compression in a while thinks this contributed. - Now in interval - her RIGHT lower extremity leg shin area with early swelling worsening redness and some pain, she wanted to treat it early before it got worse, stated last time multiple antibiotics were needed  Patient is currently working, she is mostly seated and sedentary  Denies any high risk travel to areas of current concern for COVID19. Denies any known or suspected exposure to person with or possibly with COVID19.  Admits mild right lower extremity pain with swelling Denies any open skin sores, drainage of pus, oozing or weeping from swelling, fevers, chills, sweats, body ache, cough, shortness of breath, sinus pain or pressure, headache, abdominal pain, diarrhea  Past Medical History:  Diagnosis Date  . Abnormal mammogram, unspecified 2013  . Atrial fibrillation (HCC)    2013  . Breast screening, unspecified   . Obesity, unspecified   . Screening for obesity   . Special screening for malignant neoplasms, colon   . Unspecified essential hypertension    Social History   Tobacco Use  . Smoking status: Never Smoker  . Smokeless tobacco: Never Used  Substance Use Topics  . Alcohol use: No  . Drug use: No    Current Outpatient Medications:  .  albuterol (PROVENTIL HFA;VENTOLIN HFA) 108 (90 Base) MCG/ACT inhaler, Inhale 2 puffs into the lungs every 6 (six) hours as needed for wheezing., Disp: 18 g, Rfl: 2 .  apixaban (ELIQUIS) 5 MG TABS tablet, Take 5 mg by mouth 2 (two) times daily. , Disp: , Rfl:  .  Cyanocobalamin (B-12) 2500 MCG TABS, Take 2,500 mcg by mouth daily., Disp: ,  Rfl:  .  diclofenac sodium (VOLTAREN) 1 % GEL, Apply 4 g topically 4 (four) times daily as needed (for ankle pain)., Disp: 100 g, Rfl: 2 .  diltiazem (CARDIZEM CD) 240 MG 24 hr capsule, Take 240 mg by mouth daily after breakfast., Disp: , Rfl:  .  dofetilide (TIKOSYN) 500 MCG capsule, Take 500 mcg by mouth every  12 (twelve) hours. , Disp: , Rfl:  .  furosemide (LASIX) 40 MG tablet, Take 40 mg by mouth daily., Disp: , Rfl: 5 .  levothyroxine (SYNTHROID, LEVOTHROID) 75 MCG tablet, Take 1 tablet (75 mcg total) by mouth daily before breakfast., Disp: 30 tablet, Rfl: 1 .  naproxen sodium (ANAPROX) 220 MG tablet, Take 220 mg by mouth 2 (two) times daily. , Disp: , Rfl:  .  potassium chloride SA (K-DUR,KLOR-CON) 20 MEQ tablet, Take 1 tablet (20 mEq total) by mouth daily. (Patient taking differently: Take 20 mEq by mouth at bedtime. ), Disp: 30 tablet, Rfl: 6 .  Spacer/Aero-Holding Rudean Curt, 1 Device by Does not apply route as needed., Disp: 1 each, Rfl: 0 .  cephALEXin (KEFLEX) 500 MG capsule, Take 1 capsule (500 mg total) by mouth 3 (three) times daily. For 7 days, Disp: 21 capsule, Rfl: 0 .  doxycycline (VIBRA-TABS) 100 MG tablet, Take 1 tablet (100 mg total) by mouth 2 (two) times daily. For 10 days. Take with full glass of water, stay upright 30 min after taking., Disp: 20 tablet, Rfl: 0  Depression screen [facility] 2/9 08/13/2018 11/09/2016 11/09/2016  Decreased Interest 0 0 0  Down, Depressed, Hopeless 0 0 0  PHQ - 2 Score 0 0 0  Altered sleeping - 1 -  Tired, decreased energy - 1 -  Change in appetite - 1 -  Feeling bad or failure about yourself  - 0 -  Trouble concentrating - 0 -  Moving slowly or fidgety/restless - 0 -  Suicidal thoughts - 0 -  PHQ-9 Score - 3 -    No flowsheet data found.  -------------------------------------------------------------------------- O: No physical exam performed due to remote telephone encounter.   No results found for this or any previous visit (from the past 2160 hour(s)).  -------------------------------------------------------------------------- A&P:  Problem List Items Addressed This Visit    Recurrent cellulitis of lower extremity - Primary   Relevant Medications   cephALEXin (KEFLEX) 500 MG capsule   doxycycline (VIBRA-TABS) 100 MG tablet      Clinically with RLE early onset cellulitis / lymphedema vs stasis dermatitis, in setting of recently treated LLE similar problem now resolving on Left, and starting on Right. Similar history of prior recurrent cellulitis and lymphedema issue back in 2018, treated with multiple antibiotics, compression, and lasix also had work up from [facility] with negative dopplers and other evaluation.  Plan - Advised based on her prior treatment course we can treat similarly at this time, this is limited today due to virtual visit not in person, unable to accurately examine her lower extremities, agree to proceed with empiric treatment with what has worked in past - Start taking Doxycycline antibiotic 100mg  twice daily for 10 days. Take with full glass of water and stay upright for at least 30 min after taking, may be seated or standing, but should NOT lay down. This is just a safety precaution, if this medicine does not go all the way down throat well it could cause some burning discomfort to throat and esophagus. - Start Keflex 500mg  TID x 7 days -  Use compression and RICE therapy for edema /lymphedema - Continue Furosemide as prescribed  daily - Follow-up with Vascular when ready or if needed for next steps for lymphedema or may warrant Unna boot wrap if infection unresolved.  Lastly advised if cellulitis / lymphedema remains, she may need to seek care at hospital due to oral antibiotic may not be effective for too much swelling   Meds ordered this encounter  Medications  . cephALEXin (KEFLEX) 500 MG capsule    Sig: Take 1 capsule (500 mg total) by mouth 3 (three) times daily. For 7 days    Dispense:  21 capsule    Refill:  0  . doxycycline (VIBRA-TABS) 100 MG tablet    Sig: Take 1 tablet (100 mg total) by mouth 2 (two) times daily. For 10 days. Take with full glass of water, stay upright 30 min after taking.    Dispense:  20 tablet    Refill:  0    Follow-up: - Return in 1-2  weeks as needed for lower extremity edema cellulitis if not improving, advised to go to Vascular Specialist next if needed  Patient verbalizes understanding with the above medical recommendations including the limitation of remote medical advice.  Specific follow-up and call-back criteria were given for patient to follow-up or seek medical care more urgently if needed.  - Time spent in direct consultation with patient on phone: 15 minutes   Saralyn Pilar, DO [facility] 08/13/2018, 8:41 AM

## 2018-08-13 NOTE — Patient Instructions (Addendum)
AVS info given by phone. 

## 2018-09-04 ENCOUNTER — Other Ambulatory Visit: Payer: Self-pay | Admitting: Nurse Practitioner

## 2018-09-04 DIAGNOSIS — E039 Hypothyroidism, unspecified: Secondary | ICD-10-CM

## 2018-09-05 ENCOUNTER — Other Ambulatory Visit: Payer: Self-pay

## 2018-09-05 DIAGNOSIS — E039 Hypothyroidism, unspecified: Secondary | ICD-10-CM

## 2018-09-05 MED ORDER — LEVOTHYROXINE SODIUM 75 MCG PO TABS: 75.0000 ug | ORAL_TABLET | Freq: Every day | ORAL | 0 refills | Status: DC

## 2018-09-06 ENCOUNTER — Other Ambulatory Visit: Payer: Self-pay

## 2018-09-06 ENCOUNTER — Other Ambulatory Visit: Payer: BLUE CROSS/BLUE SHIELD

## 2018-09-06 DIAGNOSIS — E039 Hypothyroidism, unspecified: Secondary | ICD-10-CM

## 2018-09-06 MED ORDER — LEVOTHYROXINE SODIUM 75 MCG PO TABS: 75.0000 ug | ORAL_TABLET | Freq: Every day | ORAL | 1 refills | Status: DC

## 2018-09-06 NOTE — Addendum Note (Signed)
Addended by: Smitty Cords on: 09/06/2018 09:56 AM   Modules accepted: Orders

## 2018-09-06 NOTE — Telephone Encounter (Signed)
Can you check with this patient's pharmacy to see if they received the new Levothyroxine daily?  If not - can give them verbal for 90 day supply, 0 refills.  The rx that was sent said E-Script Error and says it is no longer active.  I cancelled and even re ordered from scratch and I got the same error.  [id number] - [place], [place] - [address]  Saralyn Pilar, DO [facility] 09/06/2018, 10:04 AM

## 2018-09-07 LAB — TSH+FREE T4: TSH W/REFLEX TO FT4: 4.26 mIU/L (ref 0.40–4.50)

## 2018-09-10 ENCOUNTER — Ambulatory Visit: Payer: BLUE CROSS/BLUE SHIELD | Admitting: Family Medicine

## 2018-09-11 ENCOUNTER — Ambulatory Visit (INDEPENDENT_AMBULATORY_CARE_PROVIDER_SITE_OTHER): Payer: BLUE CROSS/BLUE SHIELD | Admitting: Family Medicine

## 2018-09-11 ENCOUNTER — Encounter: Payer: Self-pay | Admitting: Family Medicine

## 2018-09-11 ENCOUNTER — Other Ambulatory Visit: Payer: Self-pay

## 2018-09-11 DIAGNOSIS — E039 Hypothyroidism, unspecified: Secondary | ICD-10-CM

## 2018-09-11 NOTE — Assessment & Plan Note (Signed)
Stable, controlled hypothyroidism on levothyroxine Last TSH normal 4.26 Continue current levothyroxine daily, already refilled last week Follow-up 6 month for lab

## 2018-09-11 NOTE — Progress Notes (Signed)
Virtual Visit via Telephone The purpose of this virtual visit is to provide medical care while limiting exposure to the novel coronavirus (COVID19) for both patient and office staff.  Consent was obtained for phone visit:  Yes.   Answered questions that patient had about telehealth interaction:  Yes.   I discussed the limitations, risks, security and privacy concerns of performing an evaluation and management service by telephone. I also discussed with the patient that there may be a patient responsible charge related to this service. The patient expressed understanding and agreed to proceed.  Patient Location: Home Provider Location: [facility] (Office)  PCP is Wilhelmina McardleLauren Kennedy, AGPCNP-BC - I am currently covering during her maternity leave.   ---------------------------------------------------------------------- Chief Complaint  Patient presents with  . Hypothyroidism    S: Reviewed CMA documentation. I have called patient and gathered additional HPI as follows:   Follow-up Hypothyroidism Chronic problem, has been on levothyroxine to control symptoms, currently taking Levothyroxine 75mcg daily. Her last TSH was normal 4.26 (08/2018). Her medication was refilled and she feels like her thyroid is controlled Denies any new symptoms or concerns Denies any temperature change or instability, skin or hair changes, reduced energy fatigue, tired, worsening edema  Additional update Follow-up Lower Extremity Stasis Dermatitis vs Cellulitis - last visit 08/13/18 patient was treated for this issue with antibiotics for flare up. Since then this issue has resolved, no further concern for infection and her swelling has improved, she is back to wearing compression stockings.  Denies any high risk travel to areas of current concern for COVID19. Denies any known or suspected exposure to person with or possibly with COVID19.  Denies any fevers, chills, sweats, body ache, cough, shortness of  breath, sinus pain or pressure, headache, abdominal pain, diarrhea  Past Medical History:  Diagnosis Date  . Abnormal mammogram, unspecified 2013  . Atrial fibrillation (HCC)    2013  . Breast screening, unspecified   . Obesity, unspecified   . Screening for obesity   . Special screening for malignant neoplasms, colon   . Unspecified essential hypertension    Social History   Tobacco Use  . Smoking status: Never Smoker  . Smokeless tobacco: Never Used  Substance Use Topics  . Alcohol use: No  . Drug use: No    Current Outpatient Medications:  .  albuterol (PROVENTIL HFA;VENTOLIN HFA) 108 (90 Base) MCG/ACT inhaler, Inhale 2 puffs into the lungs every 6 (six) hours as needed for wheezing., Disp: 18 g, Rfl: 2 .  apixaban (ELIQUIS) 5 MG TABS tablet, Take 5 mg by mouth 2 (two) times daily. , Disp: , Rfl:  .  Cyanocobalamin (B-12) 2500 MCG TABS, Take 2,500 mcg by mouth daily., Disp: , Rfl:  .  diclofenac sodium (VOLTAREN) 1 % GEL, Apply 4 g topically 4 (four) times daily as needed (for ankle pain)., Disp: 100 g, Rfl: 2 .  diltiazem (CARDIZEM CD) 240 MG 24 hr capsule, Take 240 mg by mouth daily after breakfast., Disp: , Rfl:  .  dofetilide (TIKOSYN) 500 MCG capsule, Take 500 mcg by mouth every 12 (twelve) hours. , Disp: , Rfl:  .  furosemide (LASIX) 40 MG tablet, Take 40 mg by mouth daily., Disp: , Rfl: 5 .  levothyroxine (SYNTHROID) 75 MCG tablet, Take 1 tablet (75 mcg total) by mouth daily before breakfast., Disp: 90 tablet, Rfl: 1 .  naproxen sodium (ANAPROX) 220 MG tablet, Take 220 mg by mouth 2 (two) times daily. , Disp: , Rfl:  .  potassium chloride SA (K-DUR,KLOR-CON) 20 MEQ tablet, Take 1 tablet (20 mEq total) by mouth daily. (Patient taking differently: Take 20 mEq by mouth at bedtime. ), Disp: 30 tablet, Rfl: 6 .  Spacer/Aero-Holding Rudean Curt, 1 Device by Does not apply route as needed., Disp: 1 each, Rfl: 0  Depression screen [facility] 2/9 08/13/2018 11/09/2016 11/09/2016  Decreased  Interest 0 0 0  Down, Depressed, Hopeless 0 0 0  PHQ - 2 Score 0 0 0  Altered sleeping - 1 -  Tired, decreased energy - 1 -  Change in appetite - 1 -  Feeling bad or failure about yourself  - 0 -  Trouble concentrating - 0 -  Moving slowly or fidgety/restless - 0 -  Suicidal thoughts - 0 -  PHQ-9 Score - 3 -    No flowsheet data found.  -------------------------------------------------------------------------- O: No physical exam performed due to remote telephone encounter.  Lab results reviewed.  Recent Results (from the past 2160 hour(s))  TSH + free T4     Status: None   Collection Time: 09/06/18  8:51 AM  Result Value Ref Range   TSH W/REFLEX TO FT4 4.26 0.40 - 4.50 mIU/L    -------------------------------------------------------------------------- A&P:  Problem List Items Addressed This Visit    Acquired hypothyroidism - Primary    Stable, controlled hypothyroidism on levothyroxine Last TSH normal 4.26 Continue current levothyroxine daily, already refilled last week Follow-up 6 month for lab         No orders of the defined types were placed in this encounter.   Follow-up: - Return in 4-6 months for Annual Physical w/ PCP  Patient verbalizes understanding with the above medical recommendations including the limitation of remote medical advice.  Specific follow-up and call-back criteria were given for patient to follow-up or seek medical care more urgently if needed.   - Time spent in direct consultation with patient on phone: 7 minutes  Saralyn Pilar, DO [facility] 09/11/2018, 8:05 AM

## 2018-09-11 NOTE — Patient Instructions (Addendum)
AVS info given on phone. No mychart access 

## 2019-01-01 DIAGNOSIS — Z6841 Body Mass Index (BMI) 40.0 and over, adult: Secondary | ICD-10-CM | POA: Diagnosis not present

## 2019-01-01 DIAGNOSIS — G4733 Obstructive sleep apnea (adult) (pediatric): Secondary | ICD-10-CM | POA: Diagnosis not present

## 2019-01-01 DIAGNOSIS — I480 Paroxysmal atrial fibrillation: Secondary | ICD-10-CM | POA: Diagnosis not present

## 2019-01-06 ENCOUNTER — Other Ambulatory Visit: Payer: Self-pay

## 2019-01-06 ENCOUNTER — Other Ambulatory Visit
Admission: RE | Admit: 2019-01-06 | Discharge: 2019-01-06 | Disposition: A | Discharge status: Home / Self Care | Payer: BC Managed Care – PPO | Source: Ambulatory Visit | Attending: Cardiology | Admitting: Cardiology

## 2019-01-06 DIAGNOSIS — Z20828 Contact with and (suspected) exposure to other viral communicable diseases: Secondary | ICD-10-CM | POA: Diagnosis not present

## 2019-01-06 DIAGNOSIS — Z01812 Encounter for preprocedural laboratory examination: Secondary | ICD-10-CM | POA: Diagnosis not present

## 2019-01-07 LAB — SARS CORONAVIRUS 2 (TAT 6-24 HRS): SARS Coronavirus 2: NEGATIVE

## 2019-01-08 ENCOUNTER — Other Ambulatory Visit: Payer: Self-pay | Admitting: Cardiology

## 2019-01-08 DIAGNOSIS — I4819 Other persistent atrial fibrillation: Secondary | ICD-10-CM

## 2019-01-08 NOTE — H&P (Signed)
Chief Complaint: Chief Complaint  Patient presents with  . Atrial Fibrillation  Date of Service: 01/01/2019 Date of Birth: 12/18/1960 PCP: Arlis Porta., MD  History of Present Illness: Katherine Scott is a 58 y.o.female patient with a past medical history significant for paroxsymal atrial fibrillation s/p cardioversion x 2, on Eliquis, obstructive sleep apnea, and morbid obesity who presents for an acute visit. She admits to a week long history of shortness of breath with associated chest tightness and lightheadedness/fatigue. Symptoms are similar to what she has experienced in the past when noted to be in atrial fibrillation. Of note, she stopped using her CPAP machine approximately 1 month ago. She has also gained weight during the COVID-19 pandemic. Activity level is limited due to osteoarthritis. She has been compliant with Eliquis 5mg  twice daily. Admits to occasional epistaxis, but denies hematuria, hematochezia, or hematemesis.   I personally reviewed the ECG performed in the office today which revealed atrial fibrillation at a controlled ventricular rate of 81bpm.   Past Medical and Surgical History  Past Medical History Past Medical History:  Diagnosis Date  . Arthritis  . Atrial fibrillation (CMS-HCC)  . Hyperlipidemia  . Hypertension  . Obesity  . Sleep apnea  . Thyroid disease   Past Surgical History She has a past surgical history that includes Abdominal hysterectomy; egd (04/08/2002); Colonoscopy (04/08/2002); and Colonoscopy (02/18/2010).   Medications and Allergies  Current Medications  Current Outpatient Medications  Medication Sig Dispense Refill  . albuterol 90 mcg/actuation inhaler Inhale 2 inhalations into the lungs every 6 (six) hours as needed for Wheezing 8.5 g 12  . cyanocobalamin (VITAMIN B12) 1000 MCG tablet Take 1,000 mcg by mouth once daily.  Marland Kitchen diltiazem (CARDIZEM CD) 240 MG CD capsule TAKE 1 CAPSULE (240 MG TOTAL) BY MOUTH ONCE DAILY. 90 capsule 2  .  dofetilide (TIKOSYN) 500 MCG capsule TAKE 1 CAPSULE (500 MCG TOTAL) BY MOUTH EVERY 12 (TWELVE) HOURS. NEED APPT. 60 capsule 5  . ELIQUIS 5 mg tablet TAKE 1 TABLET BY MOUTH TWICE A DAY 60 tablet 11  . FUROsemide (LASIX) 40 MG tablet TAKE 1 TABLET BY MOUTH EVERY DAY 90 tablet 1  . KLOR-CON M20 20 mEq ER tablet TAKE 1 TABLET (20 MEQ TOTAL) BY MOUTH ONCE DAILY. 90 tablet 1  . levothyroxine (SYNTHROID, LEVOTHROID) 75 MCG tablet TAKE 1 TABLET (75 MCG TOTAL) BY MOUTH DAILY BEFORE BREAKFAST. 2  . naproxen sodium (ALEVE, ANAPROX) 220 MG tablet Take 220 mg by mouth 2 (two) times daily with meals.   No current facility-administered medications for this visit.   Allergies: Patient has no known allergies.  Social and Family History  Social History reports that she has never smoked. She has never used smokeless tobacco. She reports current alcohol use. She reports that she does not use drugs.  Family History Family History  Problem Relation Age of Onset  . Arthritis Mother  . Colon cancer Mother  . Heart disease Father   Review of Systems   Review of Systems: The patient denies chest pain, shortness of breath, orthopnea, paroxysmal nocturnal dyspnea, pedal edema, palpitations, heart racing, fatigue, dizziness, lightheadedness, presyncope, syncope, leg pain, leg cramping. Review of 12 Systems is negative except as described in HPI.   Physical Examination   Vitals:BP (!) 160/102  Pulse (!) 112  Resp 16  Ht 160 cm (5\' 3" )  Wt (!) 163.7 kg (361 lb)  LMP (LMP Unknown)  SpO2 93%  BMI 63.95 kg/m  Ht:160 cm (5\' 3" )  Wt:(!) 163.7 kg (361 lb) XHB:ZJIR surface area is 2.7 meters squared. Body mass index is 63.95 kg/m.  General: Well developed, morbidly obese. In no acute distress HEENT: Pupils equally reactive to light and accomodation  Neck: Supple without thyromegaly, or goiter. Carotid pulses 2+. No carotid bruits present.  Pulmonary: Clear to auscultation bilaterally; no wheezes, rales,  rhonchi Cardiovascular: Irregularly irregular, rate controlled. No gallops, murmurs or rubs Gastrointestinal: Soft nontender, nondistended, with normal bowel sounds Extremities: No cyanosis, clubbing, or edema Peripheral Pulses: 2+ in upper extremities, 2+ in lower extremities  Neurology: Alert and oriented X3 Pysch: Good affect. Responds appropriately  Assessment and Plan   58 y.o. female with  1. Paroxysmal atrial fibrillation (CMS-HCC)  -Continue Tikosyn 500mg  twice daily and diltiazem 240mg  once daily  -Continue Eliquis 5mg  twice daily -Discussed options for symptomatic atrial fibrillation including repeat cardioversion vs. re-evaluation by EP for consideration of ablation; Decided to proceed with cardioversion  -Instructed to resume use of CPAP nightly  2. OSA (obstructive sleep apnea)  -Stressed the importance of resuming CPAP nightly  3. BMI 60.0-69.9, adult (CMS-HCC)  -Had long discussion about the importance of weight loss through dietary modifications including following the Mediterranean diet and intermittent fasting  -Weight bearing exercises difficult due to arthritis; encouraged to resume and begin swimming     Orders Placed This Encounter  Procedures  . CBC w/auto Differential (5 Part)  . Basic Metabolic Panel (BMP)  . ECG 12-lead   Return After cardioversion.   MD

## 2019-01-08 NOTE — Progress Notes (Deleted)
  The note originally documented on this encounter has been moved the the encounter in which it belongs.  

## 2019-01-09 ENCOUNTER — Ambulatory Visit
Admission: RE | Admit: 2019-01-09 | Discharge: 2019-01-09 | Disposition: A | Discharge status: Home / Self Care | Payer: BC Managed Care – PPO | Attending: Cardiology | Admitting: Cardiology

## 2019-01-09 ENCOUNTER — Encounter
Admission: RE | Disposition: A | Discharge status: Home / Self Care | Payer: Self-pay | Source: Home / Self Care | Attending: Cardiology

## 2019-01-09 ENCOUNTER — Ambulatory Visit: Payer: BC Managed Care – PPO | Admitting: Anesthesiology

## 2019-01-09 ENCOUNTER — Other Ambulatory Visit: Payer: Self-pay

## 2019-01-09 ENCOUNTER — Other Ambulatory Visit: Payer: Self-pay | Admitting: Cardiology

## 2019-01-09 DIAGNOSIS — Z8249 Family history of ischemic heart disease and other diseases of the circulatory system: Secondary | ICD-10-CM | POA: Insufficient documentation

## 2019-01-09 DIAGNOSIS — E785 Hyperlipidemia, unspecified: Secondary | ICD-10-CM | POA: Diagnosis not present

## 2019-01-09 DIAGNOSIS — I480 Paroxysmal atrial fibrillation: Secondary | ICD-10-CM | POA: Diagnosis not present

## 2019-01-09 DIAGNOSIS — M1990 Unspecified osteoarthritis, unspecified site: Secondary | ICD-10-CM | POA: Insufficient documentation

## 2019-01-09 DIAGNOSIS — Z7901 Long term (current) use of anticoagulants: Secondary | ICD-10-CM | POA: Diagnosis not present

## 2019-01-09 DIAGNOSIS — Z90710 Acquired absence of both cervix and uterus: Secondary | ICD-10-CM | POA: Insufficient documentation

## 2019-01-09 DIAGNOSIS — Z7951 Long term (current) use of inhaled steroids: Secondary | ICD-10-CM | POA: Diagnosis not present

## 2019-01-09 DIAGNOSIS — I10 Essential (primary) hypertension: Secondary | ICD-10-CM | POA: Insufficient documentation

## 2019-01-09 DIAGNOSIS — G4733 Obstructive sleep apnea (adult) (pediatric): Secondary | ICD-10-CM | POA: Diagnosis not present

## 2019-01-09 DIAGNOSIS — Z6841 Body Mass Index (BMI) 40.0 and over, adult: Secondary | ICD-10-CM | POA: Insufficient documentation

## 2019-01-09 DIAGNOSIS — Z8261 Family history of arthritis: Secondary | ICD-10-CM | POA: Insufficient documentation

## 2019-01-09 DIAGNOSIS — E039 Hypothyroidism, unspecified: Secondary | ICD-10-CM | POA: Insufficient documentation

## 2019-01-09 DIAGNOSIS — Z800 Family history of malignant neoplasm of digestive organs: Secondary | ICD-10-CM | POA: Diagnosis not present

## 2019-01-09 DIAGNOSIS — Z79899 Other long term (current) drug therapy: Secondary | ICD-10-CM | POA: Insufficient documentation

## 2019-01-09 DIAGNOSIS — I4891 Unspecified atrial fibrillation: Secondary | ICD-10-CM | POA: Diagnosis not present

## 2019-01-09 DIAGNOSIS — I4819 Other persistent atrial fibrillation: Secondary | ICD-10-CM

## 2019-01-09 HISTORY — PX: CARDIOVERSION: SHX1299

## 2019-01-09 SURGERY — CARDIOVERSION
Anesthesia: General

## 2019-01-09 MED ORDER — SODIUM CHLORIDE 0.9 % IV SOLN
250.0000 mL | INTRAVENOUS | Status: DC
  Administered 2019-01-09: 07:00:00 via INTRAVENOUS

## 2019-01-09 MED ORDER — SODIUM CHLORIDE 0.9% FLUSH: 3.0000 mL | Freq: Two times a day (BID) | INTRAVENOUS | Status: DC

## 2019-01-09 MED ORDER — PROPOFOL 10 MG/ML IV BOLUS
INTRAVENOUS | Status: AC
  Filled 2019-01-09: qty 20

## 2019-01-09 MED ORDER — HYDROCORTISONE 1 % EX CREA
1.0000 "application " | TOPICAL_CREAM | Freq: Three times a day (TID) | CUTANEOUS | Status: DC | PRN
  Filled 2019-01-09: qty 28

## 2019-01-09 MED ORDER — SODIUM CHLORIDE 0.9% FLUSH: 3.0000 mL | INTRAVENOUS | Status: DC | PRN

## 2019-01-09 MED ORDER — PROPOFOL 10 MG/ML IV BOLUS
INTRAVENOUS | Status: DC | PRN
  Administered 2019-01-09: 10 mg via INTRAVENOUS
  Administered 2019-01-09 (×2): 20 mg via INTRAVENOUS
  Administered 2019-01-09: 50 mg via INTRAVENOUS
  Administered 2019-01-09 (×2): 20 mg via INTRAVENOUS
  Administered 2019-01-09: 10 mg via INTRAVENOUS

## 2019-01-09 NOTE — Anesthesia Postprocedure Evaluation (Signed)
Anesthesia Post Note  Patient: Katherine Scott  Procedure(s) Performed: CARDIOVERSION (N/A )  Patient location during evaluation: PACU Anesthesia Type: General Level of consciousness: awake and alert Pain management: pain level controlled Vital Signs Assessment: post-procedure vital signs reviewed and stable Respiratory status: spontaneous breathing, nonlabored ventilation and respiratory function stable Cardiovascular status: blood pressure returned to baseline and stable Postop Assessment: no apparent nausea or vomiting Anesthetic complications: no     Last Vitals:  Vitals:   01/09/19 0815 01/09/19 0830  BP: 131/84 138/78  Pulse: 88 (!) 106  Resp: 12 18  Temp:    SpO2: 97% 91%    Last Pain:  Vitals:   01/09/19 0830  TempSrc:   PainSc: 0-No pain                 Durenda Hurt

## 2019-01-09 NOTE — Anesthesia Post-op Follow-up Note (Signed)
Anesthesia QCDR form completed.        

## 2019-01-09 NOTE — Anesthesia Procedure Notes (Signed)
Date/Time: 01/09/2019 7:21 AM Performed by: Nelda Marseille, CRNA Pre-anesthesia Checklist: Patient identified, Emergency Drugs available, Suction available, Patient being monitored and Timeout performed Oxygen Delivery Method: Nasal cannula

## 2019-01-09 NOTE — CV Procedure (Signed)
Procedure Note  Procedure: elective cardioversion Indication: afib  After informed consent, timeout protocol and adequate sedation per department of anesthesia, patient underwent 3 attempts at cardioversion at 120 J, 150 J and 200 J with no successful conversion to sinus rhythm.  No immediate complications.  Will discharge patient to home.  Further discussion regarding further work-up and treatment options to include consideration for ablation versus rate control will be discussed as an outpatient.

## 2019-01-09 NOTE — Transfer of Care (Signed)
Immediate Anesthesia Transfer of Care Note  Patient: Katherine Scott  Procedure(s) Performed: CARDIOVERSION (N/A )  Patient Location: PACU  Anesthesia Type:General  Level of Consciousness: awake, alert  and oriented  Airway & Oxygen Therapy: Patient Spontanous Breathing and Patient connected to face mask  Post-op Assessment: Report given to RN and Post -op Vital signs reviewed and stable  Post vital signs: Reviewed and stable  Last Vitals:  Vitals Value Taken Time  BP 128/100 01/09/19 0754  Temp    Pulse 97 01/09/19 0757  Resp 12 01/09/19 0757  SpO2 99 % 01/09/19 0757  Vitals shown include unvalidated device data.  Last Pain:  Vitals:   01/09/19 0648  TempSrc: Oral  PainSc: 0-No pain         Complications: No apparent anesthesia complications

## 2019-01-09 NOTE — Anesthesia Preprocedure Evaluation (Addendum)
Anesthesia Evaluation  Patient identified by MRN, date of birth, ID band Patient awake    Reviewed: Allergy & Precautions, H&P , NPO status , Patient's Chart, lab work & pertinent test results  Airway Mallampati: II  TM Distance: >3 FB Neck ROM: limited   Comment: Large neck with Dowager's hump Dental  (+) Chipped   Pulmonary shortness of breath and with exertion, sleep apnea and Continuous Positive Airway Pressure Ventilation ,           Cardiovascular hypertension, (-) Past MI and (-) Cardiac Stents + dysrhythmias Atrial Fibrillation  Rhythm:irregular Rate:Normal     Neuro/Psych negative neurological ROS  negative psych ROS   GI/Hepatic negative GI ROS, Neg liver ROS,   Endo/Other  Hypothyroidism Morbid obesity (super morbid obesity BMI 62.5)  Renal/GU negative Renal ROS  negative genitourinary   Musculoskeletal   Abdominal   Peds  Hematology negative hematology ROS (+)   Anesthesia Other Findings Past Medical History: 2013: Abnormal mammogram, unspecified No date: Atrial fibrillation [facility])     Comment:  2013 No date: Breast screening, unspecified No date: Obesity, unspecified No date: Screening for obesity No date: Special screening for malignant neoplasms, colon No date: Unspecified essential hypertension  Past Surgical History: 2011: ABDOMINAL HYSTERECTOMY 2003: [place] January 22, 2012: BREAST BIOPSY; Left     Comment:  left breast stereotactic biopsy showed fibroadenomatous               changes with microcalcifications, fat necrosis, and               sclerosing adenosis and columnar cell changes. No               evidence of atypia or malignancy. 09/22/2016: CARDIOVERSION; N/A     Comment:  Procedure: CARDIOVERSION;  Surgeon: Teodoro Spray, MD;              Location: [place] [facility];  Service: Cardiovascular;                Laterality: N/A; 01/17/2018: CARDIOVERSION; N/A      Comment:  Procedure: CARDIOVERSION;  Surgeon: Teodoro Spray, MD;              Location: [place] [facility];  Service: Cardiovascular;                Laterality: N/A; 1985, 1987, and 1998: CESAREAN SECTION 2011: COLONOSCOPY     Comment:  Dr. Vira Agar 12/03/2014: ELECTROPHYSIOLOGIC STUDY; N/A     Comment:  Procedure: Cardioversion;  Surgeon: Teodoro Spray, MD;               Location: [facility];  Service: Cardiovascular;                Laterality: N/A; 04/01/2015: ELECTROPHYSIOLOGIC STUDY; N/A     Comment:  Procedure: Cardioversion;  Surgeon: Teodoro Spray, MD;               Location: [facility];  Service: Cardiovascular;                Laterality: N/A; 2013: KNEE SURGERY  BMI    Body Mass Index: 62.53 kg/m      Reproductive/Obstetrics negative OB ROS                            Anesthesia Physical Anesthesia Plan  ASA: III  Anesthesia Plan: General   Post-op Pain Management:  Induction:   PONV Risk Score and Plan:   Airway Management Planned: Simple Face Mask  Additional Equipment:   Intra-op Plan:   Post-operative Plan:   Informed Consent: I have reviewed the patients History and Physical, chart, labs and discussed the procedure including the risks, benefits and alternatives for the proposed anesthesia with the patient or authorized representative who has indicated his/her understanding and acceptance.     Dental Advisory Given  Plan Discussed with: Anesthesiologist and CRNA  Anesthesia Plan Comments:        Anesthesia Quick Evaluation

## 2019-01-14 DIAGNOSIS — I480 Paroxysmal atrial fibrillation: Secondary | ICD-10-CM | POA: Diagnosis not present

## 2019-01-14 DIAGNOSIS — G4733 Obstructive sleep apnea (adult) (pediatric): Secondary | ICD-10-CM | POA: Diagnosis not present

## 2019-01-14 DIAGNOSIS — Z6841 Body Mass Index (BMI) 40.0 and over, adult: Secondary | ICD-10-CM | POA: Diagnosis not present

## 2019-01-24 DIAGNOSIS — I480 Paroxysmal atrial fibrillation: Secondary | ICD-10-CM | POA: Diagnosis not present

## 2019-01-28 DIAGNOSIS — I480 Paroxysmal atrial fibrillation: Secondary | ICD-10-CM | POA: Diagnosis not present

## 2019-01-28 DIAGNOSIS — I4819 Other persistent atrial fibrillation: Secondary | ICD-10-CM | POA: Diagnosis not present

## 2019-02-03 ENCOUNTER — Other Ambulatory Visit: Payer: Self-pay

## 2019-02-03 ENCOUNTER — Emergency Department: Payer: BC Managed Care – PPO

## 2019-02-03 ENCOUNTER — Encounter: Payer: Self-pay | Admitting: Emergency Medicine

## 2019-02-03 ENCOUNTER — Inpatient Hospital Stay
Admission: EM | Admit: 2019-02-03 | Discharge: 2019-02-08 | DRG: 193 | Disposition: A | Discharge status: Home Health | Payer: BC Managed Care – PPO | Source: Ambulatory Visit | Attending: Internal Medicine | Admitting: Internal Medicine

## 2019-02-03 DIAGNOSIS — Z79899 Other long term (current) drug therapy: Secondary | ICD-10-CM

## 2019-02-03 DIAGNOSIS — J189 Pneumonia, unspecified organism: Principal | ICD-10-CM | POA: Diagnosis present

## 2019-02-03 DIAGNOSIS — Z79890 Hormone replacement therapy (postmenopausal): Secondary | ICD-10-CM

## 2019-02-03 DIAGNOSIS — Z9981 Dependence on supplemental oxygen: Secondary | ICD-10-CM

## 2019-02-03 DIAGNOSIS — J441 Chronic obstructive pulmonary disease with (acute) exacerbation: Secondary | ICD-10-CM | POA: Diagnosis not present

## 2019-02-03 DIAGNOSIS — I10 Essential (primary) hypertension: Secondary | ICD-10-CM | POA: Diagnosis present

## 2019-02-03 DIAGNOSIS — Z791 Long term (current) use of non-steroidal anti-inflammatories (NSAID): Secondary | ICD-10-CM

## 2019-02-03 DIAGNOSIS — Z803 Family history of malignant neoplasm of breast: Secondary | ICD-10-CM | POA: Diagnosis not present

## 2019-02-03 DIAGNOSIS — Z6841 Body Mass Index (BMI) 40.0 and over, adult: Secondary | ICD-10-CM | POA: Diagnosis not present

## 2019-02-03 DIAGNOSIS — Z800 Family history of malignant neoplasm of digestive organs: Secondary | ICD-10-CM | POA: Diagnosis not present

## 2019-02-03 DIAGNOSIS — Z20828 Contact with and (suspected) exposure to other viral communicable diseases: Secondary | ICD-10-CM | POA: Diagnosis present

## 2019-02-03 DIAGNOSIS — J440 Chronic obstructive pulmonary disease with acute lower respiratory infection: Secondary | ICD-10-CM | POA: Diagnosis present

## 2019-02-03 DIAGNOSIS — J9601 Acute respiratory failure with hypoxia: Secondary | ICD-10-CM | POA: Diagnosis not present

## 2019-02-03 DIAGNOSIS — I480 Paroxysmal atrial fibrillation: Secondary | ICD-10-CM | POA: Diagnosis not present

## 2019-02-03 DIAGNOSIS — R0602 Shortness of breath: Secondary | ICD-10-CM | POA: Diagnosis present

## 2019-02-03 DIAGNOSIS — G4733 Obstructive sleep apnea (adult) (pediatric): Secondary | ICD-10-CM | POA: Diagnosis not present

## 2019-02-03 DIAGNOSIS — Z90710 Acquired absence of both cervix and uterus: Secondary | ICD-10-CM

## 2019-02-03 DIAGNOSIS — E039 Hypothyroidism, unspecified: Secondary | ICD-10-CM | POA: Diagnosis present

## 2019-02-03 DIAGNOSIS — Z23 Encounter for immunization: Secondary | ICD-10-CM | POA: Diagnosis not present

## 2019-02-03 DIAGNOSIS — Z8249 Family history of ischemic heart disease and other diseases of the circulatory system: Secondary | ICD-10-CM | POA: Diagnosis not present

## 2019-02-03 DIAGNOSIS — M1990 Unspecified osteoarthritis, unspecified site: Secondary | ICD-10-CM | POA: Diagnosis not present

## 2019-02-03 DIAGNOSIS — J9621 Acute and chronic respiratory failure with hypoxia: Secondary | ICD-10-CM | POA: Diagnosis present

## 2019-02-03 DIAGNOSIS — Z7901 Long term (current) use of anticoagulants: Secondary | ICD-10-CM | POA: Diagnosis not present

## 2019-02-03 DIAGNOSIS — I4891 Unspecified atrial fibrillation: Secondary | ICD-10-CM | POA: Diagnosis not present

## 2019-02-03 DIAGNOSIS — R531 Weakness: Secondary | ICD-10-CM | POA: Diagnosis not present

## 2019-02-03 DIAGNOSIS — Z801 Family history of malignant neoplasm of trachea, bronchus and lung: Secondary | ICD-10-CM

## 2019-02-03 DIAGNOSIS — J181 Lobar pneumonia, unspecified organism: Secondary | ICD-10-CM | POA: Diagnosis not present

## 2019-02-03 HISTORY — DX: Chronic obstructive pulmonary disease, unspecified: J44.9

## 2019-02-03 HISTORY — DX: Dependence on other enabling machines and devices: Z99.89

## 2019-02-03 LAB — BLOOD GAS, ARTERIAL
Acid-Base Excess: 8.3 mmol/L — ABNORMAL HIGH (ref 0.0–2.0)
Bicarbonate: 36.4 mmol/L — ABNORMAL HIGH (ref 20.0–28.0)
FIO2: 0.44
O2 Saturation: 83.8 %
Patient temperature: 37
pCO2 arterial: 66 mmHg (ref 32.0–48.0)
pH, Arterial: 7.35 (ref 7.350–7.450)
pO2, Arterial: 51 mmHg — ABNORMAL LOW (ref 83.0–108.0)

## 2019-02-03 LAB — COMPREHENSIVE METABOLIC PANEL
ALT: 28 U/L (ref 0–44)
AST: 19 U/L (ref 15–41)
Albumin: 4 g/dL (ref 3.5–5.0)
Alkaline Phosphatase: 88 U/L (ref 38–126)
Anion gap: 13 (ref 5–15)
BUN: 20 mg/dL (ref 6–20)
CO2: 31 mmol/L (ref 22–32)
Calcium: 8.7 mg/dL — ABNORMAL LOW (ref 8.9–10.3)
Chloride: 96 mmol/L — ABNORMAL LOW (ref 98–111)
Creatinine, Ser: 0.97 mg/dL (ref 0.44–1.00)
GFR calc Af Amer: >60 mL/min (ref >60)
GFR calc non Af Amer: >60 mL/min (ref >60)
Glucose, Bld: 131 mg/dL — ABNORMAL HIGH (ref 70–99)
Potassium: 3.9 mmol/L (ref 3.5–5.1)
Sodium: 140 mmol/L (ref 135–145)
Total Bilirubin: 1.4 mg/dL — ABNORMAL HIGH (ref 0.3–1.2)
Total Protein: 8 g/dL (ref 6.5–8.1)

## 2019-02-03 LAB — CBC WITH DIFFERENTIAL/PLATELET
Abs Immature Granulocytes: 0.05 10*3/uL (ref 0.00–0.07)
Basophils Absolute: 0.1 10*3/uL (ref 0.0–0.1)
Basophils Relative: 1 %
Eosinophils Absolute: 0 10*3/uL (ref 0.0–0.5)
Eosinophils Relative: 0 %
HCT: 44.1 % (ref 36.0–46.0)
Hemoglobin: 13.1 g/dL (ref 12.0–15.0)
Immature Granulocytes: 0 %
Lymphocytes Relative: 5 %
Lymphs Abs: 0.7 10*3/uL (ref 0.7–4.0)
MCH: 26.9 pg (ref 26.0–34.0)
MCHC: 29.7 g/dL — ABNORMAL LOW (ref 30.0–36.0)
MCV: 90.6 fL (ref 80.0–100.0)
Monocytes Absolute: 0.7 10*3/uL (ref 0.1–1.0)
Monocytes Relative: 5 %
Neutro Abs: 13.2 10*3/uL — ABNORMAL HIGH (ref 1.7–7.7)
Neutrophils Relative %: 89 %
Platelets: 229 10*3/uL (ref 150–400)
RBC: 4.87 MIL/uL (ref 3.87–5.11)
RDW: 15.9 % — ABNORMAL HIGH (ref 11.5–15.5)
WBC: 14.7 10*3/uL — ABNORMAL HIGH (ref 4.0–10.5)
nRBC: 0.3 % — ABNORMAL HIGH (ref 0.0–0.2)

## 2019-02-03 LAB — LACTIC ACID, PLASMA: Lactic Acid, Venous: 1.2 mmol/L (ref 0.5–1.9)

## 2019-02-03 LAB — SARS CORONAVIRUS 2 BY RT PCR (HOSPITAL ORDER, PERFORMED IN ~~LOC~~ HOSPITAL LAB): SARS Coronavirus 2: NEGATIVE

## 2019-02-03 LAB — PROCALCITONIN: Procalcitonin: <0.1 ng/mL

## 2019-02-03 LAB — BRAIN NATRIURETIC PEPTIDE: B Natriuretic Peptide: 116 pg/mL — ABNORMAL HIGH (ref 0.0–100.0)

## 2019-02-03 MED ORDER — POTASSIUM CHLORIDE CRYS ER 20 MEQ PO TBCR
20.0000 meq | EXTENDED_RELEASE_TABLET | Freq: Every day | ORAL | Status: DC
  Administered 2019-02-03: 23:00:00 20 meq via ORAL
  Filled 2019-02-03: qty 1

## 2019-02-03 MED ORDER — IPRATROPIUM-ALBUTEROL 0.5-2.5 (3) MG/3ML IN SOLN
3.0000 mL | Freq: Once | RESPIRATORY_TRACT | Status: AC
  Administered 2019-02-03: 3 mL via RESPIRATORY_TRACT
  Filled 2019-02-03: qty 3

## 2019-02-03 MED ORDER — SODIUM CHLORIDE 0.9 % IV SOLN
2.0000 g | INTRAVENOUS | Status: DC
  Administered 2019-02-04 – 2019-02-06 (×3): 2 g via INTRAVENOUS
  Filled 2019-02-03: qty 2
  Filled 2019-02-03 (×4): qty 20

## 2019-02-03 MED ORDER — DOFETILIDE 250 MCG PO CAPS
500.0000 ug | ORAL_CAPSULE | Freq: Two times a day (BID) | ORAL | Status: DC
  Filled 2019-02-03 (×3): qty 2

## 2019-02-03 MED ORDER — AMIODARONE HCL 200 MG PO TABS
200.0000 mg | ORAL_TABLET | Freq: Two times a day (BID) | ORAL | Status: DC
  Administered 2019-02-03 – 2019-02-04 (×3): 200 mg via ORAL
  Filled 2019-02-03 (×3): qty 1

## 2019-02-03 MED ORDER — DEXAMETHASONE SODIUM PHOSPHATE 10 MG/ML IJ SOLN
10.0000 mg | Freq: Once | INTRAMUSCULAR | Status: AC
  Administered 2019-02-03: 19:00:00 10 mg via INTRAVENOUS
  Filled 2019-02-03: qty 1

## 2019-02-03 MED ORDER — IPRATROPIUM-ALBUTEROL 0.5-2.5 (3) MG/3ML IN SOLN
3.0000 mL | Freq: Once | RESPIRATORY_TRACT | Status: AC
  Administered 2019-02-03: 3 mL via RESPIRATORY_TRACT

## 2019-02-03 MED ORDER — LEVOTHYROXINE SODIUM 50 MCG PO TABS
75.0000 ug | ORAL_TABLET | Freq: Every day | ORAL | Status: DC
  Administered 2019-02-04 – 2019-02-08 (×5): 75 ug via ORAL
  Filled 2019-02-03 (×5): qty 2

## 2019-02-03 MED ORDER — APIXABAN 5 MG PO TABS
5.0000 mg | ORAL_TABLET | Freq: Two times a day (BID) | ORAL | Status: DC
  Administered 2019-02-03 – 2019-02-08 (×10): 5 mg via ORAL
  Filled 2019-02-03 (×10): qty 1

## 2019-02-03 MED ORDER — VANCOMYCIN HCL 10 G IV SOLR
1750.0000 mg | INTRAVENOUS | Status: DC
  Filled 2019-02-03: qty 1750

## 2019-02-03 MED ORDER — FUROSEMIDE 40 MG PO TABS
40.0000 mg | ORAL_TABLET | Freq: Every day | ORAL | Status: DC
  Administered 2019-02-04 – 2019-02-05 (×2): 40 mg via ORAL
  Filled 2019-02-03 (×2): qty 1

## 2019-02-03 MED ORDER — IPRATROPIUM-ALBUTEROL 0.5-2.5 (3) MG/3ML IN SOLN: 3.0000 mL | Freq: Four times a day (QID) | RESPIRATORY_TRACT | Status: DC | PRN

## 2019-02-03 MED ORDER — VITAMIN B-12 1000 MCG PO TABS
2000.0000 ug | ORAL_TABLET | Freq: Every day | ORAL | Status: DC
  Administered 2019-02-04 – 2019-02-08 (×5): 2000 ug via ORAL
  Filled 2019-02-03 (×5): qty 2

## 2019-02-03 MED ORDER — NAPROXEN 500 MG PO TABS
500.0000 mg | ORAL_TABLET | Freq: Two times a day (BID) | ORAL | Status: DC
  Administered 2019-02-04 – 2019-02-08 (×9): 500 mg via ORAL
  Filled 2019-02-03 (×11): qty 1

## 2019-02-03 MED ORDER — DILTIAZEM HCL ER COATED BEADS 240 MG PO CP24
240.0000 mg | ORAL_CAPSULE | Freq: Every day | ORAL | Status: DC
  Administered 2019-02-04 – 2019-02-08 (×5): 240 mg via ORAL
  Filled 2019-02-03: qty 1
  Filled 2019-02-03: qty 2
  Filled 2019-02-03: qty 1
  Filled 2019-02-03: qty 2
  Filled 2019-02-03 (×2): qty 1
  Filled 2019-02-03: qty 2
  Filled 2019-02-03: qty 1
  Filled 2019-02-03: qty 2

## 2019-02-03 MED ORDER — SODIUM CHLORIDE 0.9 % IV SOLN
2.0000 g | Freq: Once | INTRAVENOUS | Status: AC
  Administered 2019-02-03: 21:00:00 2 g via INTRAVENOUS

## 2019-02-03 MED ORDER — VANCOMYCIN HCL 10 G IV SOLR
2500.0000 mg | Freq: Once | INTRAVENOUS | Status: AC
  Administered 2019-02-03: 2500 mg via INTRAVENOUS
  Filled 2019-02-03: qty 2500

## 2019-02-03 MED ORDER — SODIUM CHLORIDE 0.9 % IV SOLN
500.0000 mg | Freq: Once | INTRAVENOUS | Status: DC
  Administered 2019-02-03: 19:00:00 500 mg via INTRAVENOUS
  Filled 2019-02-03: qty 500

## 2019-02-03 MED ORDER — INFLUENZA VAC SPLIT QUAD 0.5 ML IM SUSY
0.5000 mL | PREFILLED_SYRINGE | INTRAMUSCULAR | Status: AC
  Administered 2019-02-04: 0.5 mL via INTRAMUSCULAR
  Filled 2019-02-03: qty 0.5

## 2019-02-03 MED ORDER — SODIUM CHLORIDE 0.9 % IV SOLN: 500.0000 mg | INTRAVENOUS | Status: DC

## 2019-02-03 MED ORDER — METHYLPREDNISOLONE SODIUM SUCC 125 MG IJ SOLR
60.0000 mg | INTRAMUSCULAR | Status: DC
  Administered 2019-02-04 – 2019-02-05 (×2): 60 mg via INTRAVENOUS
  Filled 2019-02-03 (×2): qty 2

## 2019-02-03 NOTE — ED Notes (Signed)
Pt on 9lpm n/c hi-flow, sat 89%.

## 2019-02-03 NOTE — ED Triage Notes (Signed)
Pt presents to ED via [facility] from PCP office c/o worsening SOB x2 days and mid chest pain worse with cough. Cough is productive of yellow sputum with blood tinges. O2 sat 71% on RA, pt does not use O2 at home. Hx COPD, uses CPAP at night. +4 BLE swelling, pt states swelling is at baseline for her.

## 2019-02-03 NOTE — ED Notes (Signed)
Pharmacy called as there is no cefepime in pyxis (should be hung first as broad spectrum). Pharmacy states they will refill pyxis.

## 2019-02-03 NOTE — Consult Note (Signed)
Pharmacy Antibiotic Note  Katherine Scott is a 58 y.o. female admitted on 02/03/2019 with pneumonia.  Pharmacy has been consulted for Vancomycin dosing. Patient is currently also receiving Rocephin 2g IV daily x 5 days. Switched from Azithromycin to Vancomycin and patient takes both amiodarone and dofetilide, which can potentially increase QTc intervals.  Plan: Patient to receive 2500mg  loading dose of Vancomycin in ED.  Will order maintenance dose of Vancomycin 1750 mg IV Q 24 hrs. Goal AUC 400-550. Expected AUC: 480.2 Expected Css: 12.0 SCr used: 0.97  Will order MRSA PCR and if negative, will recommend discontinuation of medication.  Height: 5\' 2"  (157.5 cm) Weight: (!) 350 lb (158.8 kg) IBW/kg (Calculated) : 50.1  Temp (24hrs), Avg:98.4 F (36.9 C), Min:98.4 F (36.9 C), Max:98.4 F (36.9 C)  Recent Labs  Lab 02/03/19 1545  WBC 14.7*  CREATININE 0.97  LATICACIDVEN 1.2    Estimated Creatinine Clearance: 93.4 mL/min (by C-G formula based on SCr of 0.97 mg/dL).    No Known Allergies  Antimicrobials this admission: Vancomycin 10/26 >>  Rocephin 10/26 >>  Cefepime 10/26 x 1   Microbiology results: 10/26 BCx: pending 10/26 UCx: pending  10/26 Sputum: pending  10/26 MRSA PCR: pending  Thank you for allowing pharmacy to be a part of this patient's care.  Pearla Dubonnet 02/03/2019 8:28 PM

## 2019-02-03 NOTE — ED Notes (Signed)
Spoke to [place], NP about O2 sat 88% on 6L n/c. Pt is not in any acute distress at this time, states she feels better than on arrival. Per NP, pt sat just needs to stay at or above 88% since pt has COPD.

## 2019-02-03 NOTE — ED Notes (Signed)
RT at bedside.

## 2019-02-03 NOTE — Progress Notes (Signed)
PHARMACY -  BRIEF ANTIBIOTIC NOTE   Pharmacy has received consult(s) for Cefepime from an ED provider.  The patient's profile has been reviewed for ht/wt/allergies/indication/available labs.    One time order(s) placed for Cefepime 2g IV x 1.  Further antibiotics/pharmacy consults should be ordered by admitting physician if indicated.                       Thank you, Pearla Dubonnet 02/03/2019  4:50 PM

## 2019-02-03 NOTE — ED Notes (Signed)
Per Stark Klein, NP, ok to run zithromax even though it is narrow spectrum since cefepime has still not been delivered by pharmacy.

## 2019-02-03 NOTE — ED Notes (Signed)
Noted O2 sat on 6L n/c 80-85%. Pt instructed to deep breathe but sat remains same. Pt reports she does not feel like she breathing is worse, does not appear to have increased WOB. Spoke to [place], NP, plan made to try pt on hi-flow O2 n/c and obtain baseline ABG.

## 2019-02-03 NOTE — ED Provider Notes (Signed)
[facility] Emergency Department Provider Note    First MD Initiated Contact with Patient 02/03/19 1541     (approximate)  I have reviewed the triage vital signs and the nursing notes.   HISTORY  Chief Complaint Shortness of Breath    HPI Katherine DenverKay [name] is a 58 y.o. female presents to the ER for evaluation of shortness of breath that has been progressively worsening over the past several days.  Denies any pain.  No nausea or vomiting.  No measured fevers but has been coughing up phlegm.  States she last used her albuterol treatment yesterday.  Is not been on antibiotics or steroids.  Does wear CPAP at home.  Does not wear oxygen during the day and was found to be hypoxic to the low 70s.  Denies any history of heart failure but does have A. fib and is on Eliquis.    Past Medical History:  Diagnosis Date  . Abnormal mammogram, unspecified 2013  . Atrial fibrillation (HCC)    2013  . Breast screening, unspecified   . COPD (chronic obstructive pulmonary disease) (HCC)   . History of continuous positive airway pressure (CPAP) therapy at home   . Obesity, unspecified   . Screening for obesity   . Special screening for malignant neoplasms, colon   . Unspecified essential hypertension    Family History  Problem Relation Age of Onset  . Colon cancer Mother        diagnosis age 58's  . Colon cancer Sister        diagnosis age 58  . Atrial fibrillation Sister   . Breast cancer Other        Maternal Grandmother; diagnosis age 58's  . Lung cancer Other        Paternal Grandmother; diagnosis age 58's  . Colon cancer Other        Paternal Grandfather; diagnosis age 58's  . Heart disease Father    Past Surgical History:  Procedure Laterality Date  . ABDOMINAL HYSTERECTOMY  2011  . ANTERIOR CRUCIATE LIGAMENT REPAIR  2003  . BREAST BIOPSY Left January 22, 2012   left breast stereotactic biopsy showed fibroadenomatous changes with microcalcifications, fat  necrosis, and sclerosing adenosis and columnar cell changes. No evidence of atypia or malignancy.  Marland Kitchen. CARDIOVERSION N/A 09/22/2016   Procedure: CARDIOVERSION;  Surgeon: Dalia HeadingFath, Kenneth A, MD;  Location: [facility];  Service: Cardiovascular;  Laterality: N/A;  . CARDIOVERSION N/A 01/17/2018   Procedure: CARDIOVERSION;  Surgeon: Dalia HeadingFath, Kenneth A, MD;  Location: [facility];  Service: Cardiovascular;  Laterality: N/A;  . CARDIOVERSION N/A 01/09/2019   Procedure: CARDIOVERSION;  Surgeon: Dalia HeadingFath, Kenneth A, MD;  Location: [facility];  Service: Cardiovascular;  Laterality: N/A;  . CESAREAN SECTION  1985, 1987, and 1998  . COLONOSCOPY  2011   Dr. Mechele CollinElliott  . ELECTROPHYSIOLOGIC STUDY N/A 12/03/2014   Procedure: Cardioversion;  Surgeon: Dalia HeadingKenneth A Fath, MD;  Location: [facility];  Service: Cardiovascular;  Laterality: N/A;  . ELECTROPHYSIOLOGIC STUDY N/A 04/01/2015   Procedure: Cardioversion;  Surgeon: Dalia HeadingKenneth A Fath, MD;  Location: [facility];  Service: Cardiovascular;  Laterality: N/A;  . KNEE SURGERY  2013   Patient Active Problem List   Diagnosis Date Noted  . Lymphedema 02/26/2017  . 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 Acquired hypothyroidism 08/11/2016  . BMI 60.0-69.9, adult (HCC) 08/11/2016  . Paroxysmal atrial fibrillation (HCC) 02/18/2015      Prior to Admission medications   Medication Sig Start  Date End Date Taking? Authorizing Provider  apixaban (ELIQUIS) 5 MG TABS tablet Take 5 mg by mouth 2 (two) times daily.  10/31/17   [provider]  cyanocobalamin 2000 MCG tablet Take 2,000 mcg by mouth daily.    [provider]  diltiazem (CARDIZEM CD) 240 MG 24 hr capsule Take 240 mg by mouth daily after breakfast.    [provider]  dofetilide (TIKOSYN) 500 MCG capsule Take 500 mcg by mouth every 12 (twelve) hours.  07/13/16   [provider]  furosemide (LASIX) 40 MG tablet Take 40 mg by mouth daily. 11/01/17   [provider]  levothyroxine  (SYNTHROID) 75 MCG tablet Take 1 tablet (75 mcg total) by mouth daily before breakfast. 09/06/18   Karamalegos, Devonne Doughty, DO  naproxen sodium (ANAPROX) 220 MG tablet Take 440 mg by mouth 2 (two) times daily.     [provider]  potassium chloride SA (K-DUR,KLOR-CON) 20 MEQ tablet Take 1 tablet (20 mEq total) by mouth daily. Patient taking differently: Take 20 mEq by mouth at bedtime.  02/19/15   Teodoro Spray, MD  Spacer/Aero-Holding Josiah Lobo DEVI 1 Device by Does not apply route as needed. 05/24/18   Mikey College, NP    Allergies Patient has no known allergies.    Social History Social History   Tobacco Use  . Smoking status: Never Smoker  . Smokeless tobacco: Never Used  Substance Use Topics  . Alcohol use: No  . Drug use: No    Review of Systems Patient denies headaches, rhinorrhea, blurry vision, numbness, shortness of breath, chest pain, edema, cough, abdominal pain, nausea, vomiting, diarrhea, dysuria, fevers, rashes or hallucinations unless otherwise stated above in HPI. ____________________________________________   PHYSICAL EXAM:  VITAL SIGNS: Vitals:   02/03/19 1716 02/03/19 1717  BP:  (!) 165/100  Pulse:  99  Resp:  19  Temp: 98.4 F (36.9 C)   SpO2:  92%    Constitutional: Alert and oriented.  Eyes: Conjunctivae are normal.  Head: Atraumatic. Nose: No congestion/rhinnorhea. Mouth/Throat: Mucous membranes are moist.   Neck: No stridor. Painless ROM.  Cardiovascular: Normal rate, regular rhythm. Grossly normal heart sounds.  Good peripheral circulation. Respiratory: tachypnea with diminished breathsounds throughout Gastrointestinal: Soft and nontender. No distention. No abdominal bruits. No CVA tenderness. Genitourinary: deferred Musculoskeletal: No lower extremity tenderness nor edema.  No joint effusions. Neurologic:  Normal speech and language. No gross focal neurologic deficits are appreciated. No facial droop Skin:  Skin is  warm, dry and intact. No rash noted. Psychiatric: Mood and affect are normal. Speech and behavior are normal.  ____________________________________________   LABS (all labs ordered are listed, but only abnormal results are displayed)  Results for orders placed or performed during the hospital encounter of 02/03/19 (from the past 24 hour(s))  SARS Coronavirus 2 by RT PCR (hospital order, performed in [facility] hospital lab) Nasopharyngeal Nasopharyngeal Swab     Status: None   Collection Time: 02/03/19  3:44 PM   Specimen: Nasopharyngeal Swab  Result Value Ref Range   SARS Coronavirus 2 NEGATIVE NEGATIVE  Lactic acid, plasma     Status: None   Collection Time: 02/03/19  3:45 PM  Result Value Ref Range   Lactic Acid, Venous 1.2 0.5 - 1.9 mmol/L  Comprehensive metabolic panel     Status: Abnormal   Collection Time: 02/03/19  3:45 PM  Result Value Ref Range   Sodium 140 135 - 145 mmol/L   Potassium 3.9 3.5 -  5.1 mmol/L   Chloride 96 (L) 98 - 111 mmol/L   CO2 31 22 - 32 mmol/L   Glucose, Bld 131 (H) 70 - 99 mg/dL   BUN 20 6 - 20 mg/dL   Creatinine, Ser 9.60 0.44 - 1.00 mg/dL   Calcium 8.7 (L) 8.9 - 10.3 mg/dL   Total Protein 8.0 6.5 - 8.1 g/dL   Albumin 4.0 3.5 - 5.0 g/dL   AST 19 15 - 41 U/L   ALT 28 0 - 44 U/L   Alkaline Phosphatase 88 38 - 126 U/L   Total Bilirubin 1.4 (H) 0.3 - 1.2 mg/dL   GFR calc non Af Amer >60 >60 mL/min   GFR calc Af Amer >60 >60 mL/min   Anion gap 13 5 - 15  CBC WITH DIFFERENTIAL     Status: Abnormal   Collection Time: 02/03/19  3:45 PM  Result Value Ref Range   WBC 14.7 (H) 4.0 - 10.5 K/uL   RBC 4.87 3.87 - 5.11 MIL/uL   Hemoglobin 13.1 12.0 - 15.0 g/dL   HCT 45.4 09.8 - 11.9 %   MCV 90.6 80.0 - 100.0 fL   MCH 26.9 26.0 - 34.0 pg   MCHC 29.7 (L) 30.0 - 36.0 g/dL   RDW 14.7 (H) 82.9 - 56.2 %   Platelets 229 150 - 400 K/uL   nRBC 0.3 (H) 0.0 - 0.2 %   Neutrophils Relative % 89 %   Neutro Abs 13.2 (H) 1.7 - 7.7 K/uL   Lymphocytes Relative 5 %    Lymphs Abs 0.7 0.7 - 4.0 K/uL   Monocytes Relative 5 %   Monocytes Absolute 0.7 0.1 - 1.0 K/uL   Eosinophils Relative 0 %   Eosinophils Absolute 0.0 0.0 - 0.5 K/uL   Basophils Relative 1 %   Basophils Absolute 0.1 0.0 - 0.1 K/uL   Immature Granulocytes 0 %   Abs Immature Granulocytes 0.05 0.00 - 0.07 K/uL  Brain natriuretic peptide     Status: Abnormal   Collection Time: 02/03/19  3:46 PM  Result Value Ref Range   B Natriuretic Peptide 116.0 (H) 0.0 - 100.0 pg/mL   ____________________________________________  EKG My review and personal interpretation at Time: 15:44   Indication: sob  Rate: 100  Rhythm: afib Axis: normal Other: normal intervals, no stemi ____________________________________________  RADIOLOGY  I personally reviewed all radiographic images ordered to evaluate for the above acute complaints and reviewed radiology reports and findings.  These findings were personally discussed with the patient.  Please see medical record for radiology report.  ____________________________________________   PROCEDURES  Procedure(s) performed:  .Critical Care Performed by: Willy Eddy, MD Authorized by: Willy Eddy, MD   Critical care provider statement:    Critical care time (minutes):  30   Critical care time was exclusive of:  Separately billable procedures and treating other patients   Critical care was necessary to treat or prevent imminent or life-threatening deterioration of the following conditions:  Respiratory failure   Critical care was time spent personally by me on the following activities:  Development of treatment plan with patient or surrogate, discussions with consultants, evaluation of patient's response to treatment, examination of patient, obtaining history from patient or surrogate, ordering and performing treatments and interventions, ordering and review of laboratory studies, ordering and review of radiographic studies, pulse oximetry,  re-evaluation of patient's condition and review of old charts      Critical Care performed: yes ____________________________________________   INITIAL IMPRESSION / ASSESSMENT  AND PLAN / ED COURSE  Pertinent labs & imaging results that were available during my care of the patient were reviewed by me and considered in my medical decision making (see chart for details).   DDX: Asthma, copd, CHF, pna, ptx, malignancy, Pe, anemia  KYLEE UMANA is a 58 y.o. who presents to the ED with acute respiratory failure with hypoxia requiring supplemental oxygen as described above.  Patient is ill-appearing but protecting her airway.  Saturations improved on supplemental nasal cannula.  Still has diffuse wheezing concerning for COPD.  Have discussed with the patient and available family all diagnostics and treatments performed thus far and all questions were answered to the best of my ability. The patient demonstrates understanding and agreement with plan.   Clinical Course as of Feb 03 1752  Mon Feb 03, 2019  1654 Lactate is normal.  Work-up certainly concerning for pneumonia.  Awaiting Covid.  She looks more comfortable now but is still on 3 L nasal cannula.   [PR]  1749 Covid negative.  This is consistent with reacquired pneumonia given her hypoxia will admit to the hospitalist for further evaluation.  Certainly does have a component of acute COPD exacerbation and she did have improvement after nebulizers.   [PR]    Clinical Course User Index [PR] Willy Eddy, MD    The patient was evaluated in Emergency Department today for the symptoms described in the history of present illness. He/she was evaluated in the context of the global COVID-19 pandemic, which necessitated consideration that the patient might be at risk for infection with the SARS-CoV-2 virus that causes COVID-19. Institutional protocols and algorithms that pertain to the evaluation of patients at risk for COVID-19 are in a state  of rapid change based on information released by regulatory bodies including the CDC and federal and state organizations. These policies and algorithms were followed during the patient's care in the ED.  As part of my medical decision making, I reviewed the following data within the electronic MEDICAL RECORD NUMBER Nursing notes reviewed and incorporated, Labs reviewed, notes from prior ED visits and [place] Controlled Substance Database   ____________________________________________   FINAL CLINICAL IMPRESSION(S) / ED DIAGNOSES  Final diagnoses:  Acute respiratory failure with hypoxia (HCC)  Community acquired pneumonia, unspecified laterality      NEW MEDICATIONS STARTED DURING THIS VISIT:  New Prescriptions   No medications on file     Note:  This document was prepared using Dragon voice recognition software and may include unintentional dictation errors.    Willy Eddy, MD 02/03/19 [phone]

## 2019-02-03 NOTE — H&P (Addendum)
[facility] [place] [facility]   PATIENT NAME: Katherine Scott    MR#:  [id number]  DATE OF BIRTH:  1961/03/06  DATE OF ADMISSION:  02/03/2019  PRIMARY CARE PHYSICIAN: Galen Manila, NP   REQUESTING/REFERRING PHYSICIAN: Willy Eddy, MD  CHIEF COMPLAINT:   Chief Complaint  Patient presents with  . Shortness of Breath    HISTORY OF PRESENT ILLNESS:  58 y.o. female with pertinent past medical history of COPD, OSA on CPAP, atrial fibrillation on anticoagulation, morbid obesity, hypertension, hypothyroidism, hyperlipidemia, and arthritis presenting to the ED with chief complaints of worsening shortness of breath.  Patient report that of breath since last week and has been progressive in the last 2 days.  Describes  associated symptoms of fevers and chills and productive yellow sputum with blood tinges cough.  Denies nausea or vomiting, chest pain, or recent sick contacts.  Patient states that she last used her albuterol treatment yesterday.  Does wear CPAP at home but does not wear oxygen during the day.  Patient was noted today with oxygen saturation of 71% on room air.  On arrival to the ED, she was afebrile with blood pressure 165/100 mm Hg and pulse rate 99 beats/min. There were no focal neurological deficits; she was alert and oriented x4, but noted to be a very short of breath.  Initial labs revealed slightly elevated BNP 116, COVID-19 negative, WBC 14.7 otherwise unremarkable CBC.  Lactic acid 1.2.  Chest x-ray showed bilateral pneumonia.  Patient was started on empiric antibiotics with cefepime and azithromycin.  He also received Decadron in the ED and DuoNeb's.  Hospitalist asked to admit for further management.  PAST MEDICAL HISTORY:   Past Medical History:  Diagnosis Date  . Abnormal mammogram, unspecified 2013  . Atrial fibrillation (HCC)    2013  . Breast screening, unspecified   . COPD (chronic obstructive pulmonary disease) (HCC)   .  History of continuous positive airway pressure (CPAP) therapy at home   . Obesity, unspecified   . Screening for obesity   . Special screening for malignant neoplasms, colon   . Unspecified essential hypertension     PAST SURGICAL HISTORY:   Past Surgical History:  Procedure Laterality Date  . ABDOMINAL HYSTERECTOMY  2011  . ANTERIOR CRUCIATE LIGAMENT REPAIR  2003  . BREAST BIOPSY Left January 22, 2012   left breast stereotactic biopsy showed fibroadenomatous changes with microcalcifications, fat necrosis, and sclerosing adenosis and columnar cell changes. No evidence of atypia or malignancy.  Marland Kitchen CARDIOVERSION N/A 09/22/2016   Procedure: CARDIOVERSION;  Surgeon: Dalia Heading, MD;  Location: [facility] ORS;  Service: Cardiovascular;  Laterality: N/A;  . CARDIOVERSION N/A 01/17/2018   Procedure: CARDIOVERSION;  Surgeon: Dalia Heading, MD;  Location: [facility] ORS;  Service: Cardiovascular;  Laterality: N/A;  . CARDIOVERSION N/A 01/09/2019   Procedure: CARDIOVERSION;  Surgeon: Dalia Heading, MD;  Location: [facility] ORS;  Service: Cardiovascular;  Laterality: N/A;  . CESAREAN SECTION  1985, 1987, and 1998  . COLONOSCOPY  2011   Dr. Mechele Collin  . ELECTROPHYSIOLOGIC STUDY N/A 12/03/2014   Procedure: Cardioversion;  Surgeon: Dalia Heading, MD;  Location: [facility] ORS;  Service: Cardiovascular;  Laterality: N/A;  . ELECTROPHYSIOLOGIC STUDY N/A 04/01/2015   Procedure: Cardioversion;  Surgeon: Dalia Heading, MD;  Location: [facility] ORS;  Service: Cardiovascular;  Laterality: N/A;  . KNEE SURGERY  2013    SOCIAL HISTORY:   Social History   Tobacco Use  . Smoking status:  Never Smoker  . Smokeless tobacco: Never Used  Substance Use Topics  . Alcohol use: No    FAMILY HISTORY:   Family History  Problem Relation Age of Onset  . Colon cancer Mother        diagnosis age 77's  . Colon cancer Sister        diagnosis age 60  . Atrial fibrillation Sister   . Breast cancer Other        Maternal Grandmother;  diagnosis age 66's  . Lung cancer Other        Paternal Grandmother; diagnosis age 80's  . Colon cancer Other        Paternal Grandfather; diagnosis age 49's  . Heart disease Father     DRUG ALLERGIES:  No Known Allergies  REVIEW OF SYSTEMS:   Review of Systems  Constitutional: Positive for chills and fever. Negative for malaise/fatigue and weight loss.  HENT: Negative for congestion, hearing loss and sore throat.   Eyes: Negative for blurred vision and double vision.  Respiratory: Positive for cough, sputum production, shortness of breath and wheezing.   Cardiovascular: Positive for leg swelling. Negative for chest pain, palpitations and orthopnea.  Gastrointestinal: Negative for abdominal pain, diarrhea, nausea and vomiting.  Genitourinary: Negative for dysuria and urgency.  Musculoskeletal: Negative for myalgias.  Skin: Negative for rash.  Neurological: Negative for dizziness, sensory change, speech change, focal weakness and headaches.  Psychiatric/Behavioral: Negative for depression.   MEDICATIONS AT HOME:   Prior to Admission medications   Medication Sig Start Date End Date Taking? Authorizing Provider  albuterol (VENTOLIN HFA) 108 (90 Base) MCG/ACT inhaler Inhale 2 puffs into the lungs every 6 (six) hours as needed for wheezing. 01/09/18  Yes [provider]  amiodarone (PACERONE) 200 MG tablet Take 200-400 mg by mouth 2 (two) times daily. 01/14/19  Yes [provider]  apixaban (ELIQUIS) 5 MG TABS tablet Take 5 mg by mouth 2 (two) times daily.  10/31/17  Yes [provider]  cyanocobalamin 2000 MCG tablet Take 2,000 mcg by mouth daily.   Yes [provider]  diltiazem (CARDIZEM CD) 240 MG 24 hr capsule Take 240 mg by mouth daily after breakfast.   Yes [provider]  furosemide (LASIX) 40 MG tablet Take 40 mg by mouth daily. 11/01/17  Yes [provider]  levothyroxine (SYNTHROID) 75 MCG tablet Take 1 tablet (75 mcg total)  by mouth daily before breakfast. 09/06/18  Yes Karamalegos, Netta Neat, DO  potassium chloride SA (K-DUR,KLOR-CON) 20 MEQ tablet Take 1 tablet (20 mEq total) by mouth daily. Patient taking differently: Take 20 mEq by mouth at bedtime.  02/19/15  Yes Dalia Heading, MD  dofetilide (TIKOSYN) 500 MCG capsule Take 500 mcg by mouth every 12 (twelve) hours.  07/13/16   [provider]  naproxen sodium (ANAPROX) 220 MG tablet Take 440 mg by mouth 2 (two) times daily.     [provider]  Spacer/Aero-Holding Chambers DEVI 1 Device by Does not apply route as needed. 05/24/18   Galen Manila, NP      VITAL SIGNS:  Blood pressure (!) 178/79, pulse 83, temperature 98.4 F (36.9 C), temperature source Oral, resp. rate (!) 21, height  (1.575 m), weight (!) 158.8 kg, SpO2 92 %.  PHYSICAL EXAMINATION:   Physical Exam  GENERAL:  58 y.o.-year-old patient lying in the bed with no acute distress.  EYES: Pupils equal, round, reactive to light and accommodation. No scleral icterus. Extraocular muscles  intact.  HEENT: Head atraumatic, normocephalic. Oropharynx and nasopharynx clear.  NECK:  Supple, no jugular venous distention. No thyroid enlargement, no tenderness.  LUNGS: Decreased breath sounds bilaterally, mild tomoderate wheezing, crackles and rales auscultated bilaterally. No rhonchi or crepitation. No use of accessory muscles of respiration.  CARDIOVASCULAR: S1, S2 normal. No murmurs, rubs, or gallops.  ABDOMEN: Soft, nontender, nondistended. Bowel sounds present. No organomegaly or mass.  EXTREMITIES: Bilateral lower extremities  edema, cyanosis, or clubbing.  NEUROLOGIC: Cranial nerves II through XII are intact. Muscle strength 5/5 in all extremities. Sensation intact. Gait not checked.  PSYCHIATRIC: The patient is alert and oriented x 3.  SKIN: No obvious rash, lesion, or ulcer.   DATA REVIEWED:  LABORATORY PANEL:   CBC Recent Labs  Lab 02/03/19 1545  WBC 14.7*   HGB 13.1  HCT 44.1  PLT 229   ------------------------------------------------------------------------------------------------------------------  Chemistries  Recent Labs  Lab 02/03/19 1545  NA 140  K 3.9  CL 96*  CO2 31  GLUCOSE 131*  BUN 20  CREATININE 0.97  CALCIUM 8.7*  AST 19  ALT 28  ALKPHOS 88  BILITOT 1.4*   ------------------------------------------------------------------------------------------------------------------  Cardiac Enzymes No results for input(s): TROPONINI in the last 168 hours. ------------------------------------------------------------------------------------------------------------------  RADIOLOGY:  Dg Chest Port 1 View  Result Date: 02/03/2019 CLINICAL DATA:  Two-day history of progressively worsening shortness of breath and productive cough. Hypoxemia (oxygen saturation 71% on room air). Current history of COPD with CPAP usage. EXAM: PORTABLE CHEST 1 VIEW COMPARISON:  03/16/2010 FINDINGS: Cardiac silhouette moderately enlarged for AP portable technique with significant increase in heart size since 2011. Airspace opacities throughout both lungs, most confluent in the RIGHT UPPER LOBE. Mild pulmonary venous hypertension without overt pulmonary edema. No visible pleural effusions. IMPRESSION: 1. Acute airspace pneumonia throughout both lungs, most confluent in the RIGHT UPPER LOBE. 2. Moderate cardiomegaly. Electronically Signed   By: Evangeline Dakin M.D.   On: 02/03/2019 16:27    EKG:  EKG: unchanged from previous tracings, atrial fibrillation, rate 100.  IMPRESSION AND PLAN:  58 y.o. female with pertinent past medical history of COPD, OSA on CPAP, atrial fibrillation on anticoagulation, morbid obesity, hypertension, hypothyroidism, hyperlipidemia, and arthritis presenting to the ED with chief complaints of worsening shortness of breath.  1. Acute on chronic respiratory failure with hypoxemia -likely secondary to bilateral pneumonia - Admit to  MedSurg unit - Chest x-ray reviewed and showed bilateral pneumonia -Trend lactic acid - Blood cultures pending - Cultures pending - Check procalcitonin - Check strep and Legionella pneumonia urine antigen - Start empiric antibiotics with vanc and ceftriaxone. Patient on Amiodarone and Tikosyn risk for Torsades and OTc prolongation on Azithromycin  2.  Acute on chronic COPD exacerbation secondary to pneumonia as above - Supplemental O2, goal sat 88-92% - Bronchodilators (albuterol/ipratropium) standing and PRN - methylprednisolone  - Sputum culture - Antibiotics as above  3. OSA on CPAP - Will place on nocturnal CPAP  4. Paroxysmal atrial fibrillation -rate controlled on amiodarone - Continue anticoagulation with Eliquis  5. Hypothyroidism -continue Synthroid - Check TSH  6. Bilateral lower extremity edema -no evidence of cellulitis - continue home Lasix as renal functions allow  7. DVT prophylaxis - Therapeutically anti-coagulated Eliquis   All the records are reviewed and case discussed with ED provider. Management plans discussed with the patient, family and they are in agreement.  CODE STATUS: FULL  TOTAL TIME TAKING CARE OF THIS PATIENT: 50 minutes.    on 02/03/2019 at 7:02 PM  Webb SilversmithElizabeth , DNP, FNP-BC [facility] Nurse Practitioner Between 7am to 6pm - Pager [phone]- [phone]  After 6pm go to [email or web address] - Social research officer, governmentpassword [email or web address]  [facility] [place] [facility]  Office  [phone]  CC: Primary care physician; Galen ManilaKennedy, Lauren Renee, NP

## 2019-02-03 NOTE — ED Notes (Signed)
Called RT to try pt on hi-flow O2 and ABG.

## 2019-02-04 LAB — BASIC METABOLIC PANEL
Anion gap: 9 (ref 5–15)
BUN: 20 mg/dL (ref 6–20)
CO2: 32 mmol/L (ref 22–32)
Calcium: 8.5 mg/dL — ABNORMAL LOW (ref 8.9–10.3)
Chloride: 99 mmol/L (ref 98–111)
Creatinine, Ser: 0.87 mg/dL (ref 0.44–1.00)
GFR calc Af Amer: >60 mL/min (ref >60)
GFR calc non Af Amer: >60 mL/min (ref >60)
Glucose, Bld: 153 mg/dL — ABNORMAL HIGH (ref 70–99)
Potassium: 5.1 mmol/L (ref 3.5–5.1)
Sodium: 140 mmol/L (ref 135–145)

## 2019-02-04 LAB — STREP PNEUMONIAE URINARY ANTIGEN: Strep Pneumo Urinary Antigen: NEGATIVE

## 2019-02-04 LAB — HIV ANTIBODY (ROUTINE TESTING W REFLEX): HIV Screen 4th Generation wRfx: NONREACTIVE

## 2019-02-04 LAB — MRSA PCR SCREENING: MRSA by PCR: NEGATIVE

## 2019-02-04 LAB — PROCALCITONIN: Procalcitonin: <0.1 ng/mL

## 2019-02-04 MED ORDER — DOXYCYCLINE HYCLATE 100 MG PO TABS
100.0000 mg | ORAL_TABLET | Freq: Two times a day (BID) | ORAL | Status: DC
  Administered 2019-02-04 – 2019-02-08 (×9): 100 mg via ORAL
  Filled 2019-02-04 (×9): qty 1

## 2019-02-04 NOTE — Evaluation (Signed)
Physical Therapy Evaluation Patient Details Name: Katherine Scott MRN: [id number] DOB: 1960/11/24 Today's Date: 02/04/2019   History of Present Illness  Pt is a 58 y/o F admitted for bilateral pneumonia. PMH includes a fib, COPD.  Clinical Impression  Pt is a pleasant 58 y/o F admitted for bilateral pneumonia. Upon entry, pt is resting in bed without pain. SpO2 90% on 9L O2 via [place] prior to activity. This date, pt performs bed mobility, transfers, and ambulation with supervision/CGA and no AD required. SpO2 drops with exercise; 86% following supine therex and 80% following amb. Pt will continue to benefit from skilled PT to address deficits in activity tolerance, balance, and strength. Pt will benefit from skilled PT to address above deficits; Current recommendation for follow-up care is HHPT.       Follow Up Recommendations Home health PT    Equipment Recommendations  None recommended by PT    Recommendations for Other Services       Precautions / Restrictions Precautions Precautions: None Restrictions Weight Bearing Restrictions: No      Mobility  Bed Mobility Overal bed mobility: Needs Assistance Bed Mobility: Supine to Sit     Supine to sit: Supervision     General bed mobility comments: Pt able to perform bed mobility without assist from PT; Pt used bedrails  Transfers Overall transfer level: Needs assistance   Transfers: Sit to/from Stand Sit to Stand: Min guard         General transfer comment: Min guard provided for transfer; PT able to perform without use of assistive device  Ambulation/Gait Ambulation/Gait assistance: Min guard Gait Distance (Feet): 5 Feet Assistive device: None Gait Pattern/deviations: Step-through pattern     General Gait Details: Pt amb with step through pattern to recliner. Able to perform without use of AD with CGA.   Stairs            Wheelchair Mobility    Modified Rankin (Stroke Patients Only)       Balance  Overall balance assessment: Needs assistance Sitting-balance support: Feet supported Sitting balance-Leahy Scale: Good     Standing balance support: Bilateral upper extremity supported Standing balance-Leahy Scale: Good                               Pertinent Vitals/Pain Pain Assessment: No/denies pain Pain Intervention(s): Monitored during session    Home Living Family/patient expects to be discharged to:: Private residence Living Arrangements: Alone Available Help at Discharge: Family;Available PRN/intermittently Type of Home: House Home Access: Stairs to enter Entrance Stairs-Rails: Left Entrance Stairs-Number of Steps: 4 Home Layout: Two level;Able to live on main level with bedroom/bathroom Home Equipment: [facility]) Additional Comments: Pt uses HurryCane to amb in places that she is not fully comfortable. Lives on one floor of her apt, doesn't use alternate level    Prior Function Level of Independence: Independent with assistive device(s)         Comments: Pt reports to be independent in all ADL's/iADL's     Hand Dominance        Extremity/Trunk Assessment   Upper Extremity Assessment Upper Extremity Assessment: Overall WFL for tasks assessed    Lower Extremity Assessment Lower Extremity Assessment: Overall WFL for tasks assessed    Cervical / Trunk Assessment Cervical / Trunk Assessment: Kyphotic  Communication   Communication: No difficulties  Cognition Arousal/Alertness: Awake/alert Behavior During Therapy: WFL for tasks assessed/performed Overall Cognitive Status: Within Functional Limits for  tasks assessed                                        General Comments General comments (skin integrity, edema, etc.): Pt administered 9L O2 via [place] throughout session. SpO2 monitored: Pt in 90% in all positions, but dropped with exercises. Seated exercises dropped to 86%, standing exercises remained ~90%. When pt sat down after  short bout of amb, SpO2 dropped to 80%.     Exercises Other Exercises Other Exercises: Supine, 10 repetitions, bilateral: AP, SAQ, QS, GS; PT supervision Other Exercises: Standing, 10 reps, bilateral: march; PT provided CGA and pt held onto bedrail   Assessment/Plan    PT Assessment Patient needs continued PT services  PT Problem List Decreased strength;Decreased activity tolerance;Decreased balance;Decreased mobility;Obesity;Cardiopulmonary status limiting activity       PT Treatment Interventions      PT Goals (Current goals can be found in the Care Plan section)  Acute Rehab PT Goals Patient Stated Goal: to return home PT Goal Formulation: With patient Time For Goal Achievement: 02/18/19 Potential to Achieve Goals: Good    Frequency     Barriers to discharge        Co-evaluation               AM-PAC PT "6 Clicks" Mobility  Outcome Measure Help needed turning from your back to your side while in a flat bed without using bedrails?: None Help needed moving from lying on your back to sitting on the side of a flat bed without using bedrails?: A Little Help needed moving to and from a bed to a chair (including a wheelchair)?: None Help needed standing up from a chair using your arms (e.g., wheelchair or bedside chair)?: A Little Help needed to walk in hospital room?: A Little Help needed climbing 3-5 steps with a railing? : A Little 6 Click Score: 20    End of Session Equipment Utilized During Treatment: Gait belt Activity Tolerance: Patient tolerated treatment well;Other (comment)(Tx limited by SpO2) Patient left: in chair;with chair alarm set;with call bell/phone within reach Nurse Communication: Mobility status PT Visit Diagnosis: Unsteadiness on feet (R26.81)    Time: 3662-9476 PT Time Calculation (min) (ACUTE ONLY): 34 min   Charges:              Dixie Dials, SPT   Cimberly Stoffel 02/04/2019, 1:14 PM

## 2019-02-04 NOTE — Progress Notes (Signed)
[facility] [place] NAME: Katherine Scott    MR#:  [id number]  DATE OF BIRTH:  [birth year]  SUBJECTIVE:  Improved SOB but still on 8 L O2 this am via [place] does not wear O2 at home  Wears CPAP at night  REVIEW OF SYSTEMS:    Review of Systems  Constitutional: Negative for fever, chills weight loss HENT: Negative for ear pain, nosebleeds, congestion, facial swelling, rhinorrhea, neck pain, neck stiffness and ear discharge.   Respiratory: Negative for cough, ++IMPROVED shortness of breath, wheezing  Cardiovascular: Negative for chest pain, palpitations and +chronic leg swelling.  Gastrointestinal: Negative for heartburn, abdominal pain, vomiting, diarrhea or consitpation Genitourinary: Negative for dysuria, urgency, frequency, hematuria Musculoskeletal: Negative for back pain or joint pain Neurological: Negative for dizziness, seizures, syncope, focal weakness,  numbness and headaches.  Hematological: Does not bruise/bleed easily.  Psychiatric/Behavioral: Negative for hallucinations, confusion, dysphoric mood    Tolerating Diet: yes      DRUG ALLERGIES:  No Known Allergies  VITALS:  Blood pressure (!) 115/58, pulse 82, temperature 98 F (36.7 C), temperature source Oral, resp. rate 20, height 5\' 3"  (1.6 m), weight (!) 165 kg, SpO2 90 %.  PHYSICAL EXAMINATION:  Constitutional: Appears morbidly obese. No distress. HENT: Normocephalic. Marland Kitchen Oropharynx is clear and moist.  Eyes: Conjunctivae and EOM are normal. PERRLA, no scleral icterus.  Neck: Normal ROM. Neck supple. No JVD. No tracheal deviation. CVS: RRR, S1/S2 +, no murmurs, no gallops, no carotid bruit.  Pulmonary:decreaed throughout no wheezing crackles  Abdominal: Soft. BS +,  no distension, tenderness, rebound or guarding.  Musculoskeletal: Normal range of motion. 1+ LEE chronic and no tenderness.  Neuro: Alert. CN 2-12 grossly intact. No focal deficits. Skin: Skin is warm and dry.  No rash noted. Psychiatric: Normal mood and affect.      LABORATORY PANEL:   CBC Recent Labs  Lab 02/03/19 1545  WBC 14.7*  HGB 13.1  HCT 44.1  PLT 229   ------------------------------------------------------------------------------------------------------------------  Chemistries  Recent Labs  Lab 02/03/19 1545 02/04/19 0440  NA 140 140  K 3.9 5.1  CL 96* 99  CO2 31 32  GLUCOSE 131* 153*  BUN 20 20  CREATININE 0.97 0.87  CALCIUM 8.7* 8.5*  AST 19  --   ALT 28  --   ALKPHOS 88  --   BILITOT 1.4*  --    ------------------------------------------------------------------------------------------------------------------  Cardiac Enzymes No results for input(s): TROPONINI in the last 168 hours. ------------------------------------------------------------------------------------------------------------------  RADIOLOGY:  Dg Chest Port 1 View  Result Date: 02/03/2019 CLINICAL DATA:  Two-day history of progressively worsening shortness of breath and productive cough. Hypoxemia (oxygen saturation 71% on room air). Current history of COPD with CPAP usage. EXAM: PORTABLE CHEST 1 VIEW COMPARISON:  03/16/2010 FINDINGS: Cardiac silhouette moderately enlarged for AP portable technique with significant increase in heart size since 2011. Airspace opacities throughout both lungs, most confluent in the RIGHT UPPER LOBE. Mild pulmonary venous hypertension without overt pulmonary edema. No visible pleural effusions. IMPRESSION: 1. Acute airspace pneumonia throughout both lungs, most confluent in the RIGHT UPPER LOBE. 2. Moderate cardiomegaly. Electronically Signed   By: Evangeline Dakin M.D.   On: 02/03/2019 16:27     ASSESSMENT AND PLAN:   58 y.o. female with pertinent past medical history of COPD, OSA on CPAP, atrial fibrillation on anticoagulation, morbid obesity, hypertension, hypothyroidism, hyperlipidemia, and arthritis presenting to the ED with chief complaints of worsening  shortness of breath.  1.  Acute hypoxic respiratory failure due to bilateral CAP. Wean I2 ok to keep O2 sats 90-92% Continue Rocephin and Dxycycline. Patient on Amiodarone and Tikosyn risk for Torsades and OTc prolongation on Azithromycin COVID and MRSA PCR negative.  2.  Acute on chronic COPD exacerbation due to pneumonia -  Continue steroids and Nebs  3. OSA on CPAP at night  4. Paroxysmal atrial fibrillation: Continue amiodarone, dilt and tikosyn for heart rate control Continue anticoagulation with Eliquis for CVA prevention  5. Hypothyroidism: Continue Synthroid   6. Chronic Bilateral lower extremity edema -no evidence of cellulitis Continue home Lasix       Management plans discussed with the patient and she is in agreement.  CODE STATUS: full  TOTAL TIME TAKING CARE OF THIS PATIENT: 33 minutes.     POSSIBLE D/C 2-4 days, DEPENDING ON CLINICAL CONDITION.   Adrian Saran M.D on 02/04/2019 at 9:57 AM  Between 7am to 6pm - Pager - [phone] After 6pm go to [email or web address] - password [email or web address] [facility]  [facility] [place] [facility]  Office  [phone]  CC: Primary care physician; Galen Manila, NP  Note: This dictation was prepared with Dragon dictation along with smaller phrase technology. Any transcriptional errors that result from this process are unintentional.

## 2019-02-05 LAB — URINE CULTURE: Culture: NO GROWTH

## 2019-02-05 LAB — PROCALCITONIN: Procalcitonin: <0.1 ng/mL

## 2019-02-05 LAB — LEGIONELLA PNEUMOPHILA SEROGP 1 UR AG: L. pneumophila Serogp 1 Ur Ag: NEGATIVE

## 2019-02-05 MED ORDER — SODIUM CHLORIDE 0.9 % IV SOLN
INTRAVENOUS | Status: DC | PRN
  Administered 2019-02-05 – 2019-02-06 (×2): 30 mL via INTRAVENOUS

## 2019-02-05 MED ORDER — AMIODARONE HCL 200 MG PO TABS: 200.0000 mg | ORAL_TABLET | Freq: Every day | ORAL | Status: DC

## 2019-02-05 MED ORDER — FUROSEMIDE 10 MG/ML IJ SOLN
40.0000 mg | Freq: Two times a day (BID) | INTRAMUSCULAR | Status: DC
  Administered 2019-02-05 – 2019-02-06 (×2): 40 mg via INTRAVENOUS
  Filled 2019-02-05 (×2): qty 4

## 2019-02-05 MED ORDER — METHYLPREDNISOLONE SODIUM SUCC 40 MG IJ SOLR
40.0000 mg | Freq: Two times a day (BID) | INTRAMUSCULAR | Status: DC
  Administered 2019-02-05 – 2019-02-07 (×4): 40 mg via INTRAVENOUS
  Filled 2019-02-05 (×4): qty 1

## 2019-02-05 MED ORDER — AMIODARONE HCL 200 MG PO TABS
200.0000 mg | ORAL_TABLET | Freq: Two times a day (BID) | ORAL | Status: DC
  Administered 2019-02-05 – 2019-02-08 (×7): 200 mg via ORAL
  Filled 2019-02-05 (×7): qty 1

## 2019-02-05 NOTE — Progress Notes (Signed)
Physical Therapy Treatment Patient Details Name: Katherine Scott MRN: [id number] DOB: 1960/05/30 Today's Date: 02/05/2019    History of Present Illness Pt is a 58 y/o F admitted for bilateral pneumonia. PMH includes a fib, COPD.    PT Comments    Pt is progressing toward goals. Upon entry, pt is resting in bed and RN is in room. Supplemental O2 has been reduced to 4 L/min and pt is able to perform exercises with this amount. SpO2 reduced to 79% following side-stepping, but normalized to 90% prior to PT leaving. This date, pt performs bed mobility, transfers, and ambulation with supervision/min guard and no AD required. Pt will continue to benefit from skilled PT to address deficits in activity tolerance, strength.     Follow Up Recommendations  Home health PT     Equipment Recommendations  None recommended by PT    Recommendations for Other Services       Precautions / Restrictions Precautions Precautions: None Restrictions Weight Bearing Restrictions: No    Mobility  Bed Mobility Overal bed mobility: Needs Assistance Bed Mobility: Supine to Sit     Supine to sit: Supervision     General bed mobility comments: Pt able to perform bed mobility safely without assist; uses bed rails  Transfers Overall transfer level: Needs assistance Equipment used: None Transfers: Sit to/from Stand Sit to Stand: Min guard         General transfer comment: Pt able to transfer safely without use of AD. Pt performs without difficulty   Ambulation/Gait Ambulation/Gait assistance: Min guard Gait Distance (Feet): 5 Feet Assistive device: None Gait Pattern/deviations: Step-through pattern     General Gait Details: Pt able to perform side-stepping at bedside without use of bed for support or handrails   Stairs             Wheelchair Mobility    Modified Rankin (Stroke Patients Only)       Balance Overall balance assessment: Needs assistance Sitting-balance support:  Feet supported Sitting balance-Leahy Scale: Good     Standing balance support: Bilateral upper extremity supported Standing balance-Leahy Scale: Good                              Cognition Arousal/Alertness: Awake/alert Behavior During Therapy: WFL for tasks assessed/performed Overall Cognitive Status: Within Functional Limits for tasks assessed                                        Exercises Other Exercises Other Exercises: Supine, 10 repetitions, bilateral: AP, QS, GS, resisted add/abd; PT supervision Other Exercises: Standing, 10 reps, bilateral: march, side stepping; PT provided CGA and pt held onto bedrail for march Other Exercises: Seated EOB, 12 reps, bilateral: LAQ    General Comments General comments (skin integrity, edema, etc.): RN has just decreased dose of O2 to 4 L/min O2 via [place]. SpO2 monitored throughout tx, dropped to 85% following therex and 79% following side-stepping activity. Upon exit, pt is at 90%.      Pertinent Vitals/Pain Pain Assessment: No/denies pain    Home Living                      Prior Function            PT Goals (current goals can now be found in the care plan section) Acute  Rehab PT Goals Patient Stated Goal: to return home PT Goal Formulation: With patient Time For Goal Achievement: 02/18/19 Potential to Achieve Goals: Good Progress towards PT goals: Progressing toward goals    Frequency    Min 2X/week      PT Plan Current plan remains appropriate    Co-evaluation              AM-PAC PT "6 Clicks" Mobility   Outcome Measure  Help needed turning from your back to your side while in a flat bed without using bedrails?: None Help needed moving from lying on your back to sitting on the side of a flat bed without using bedrails?: A Little Help needed moving to and from a bed to a chair (including a wheelchair)?: None Help needed standing up from a chair using your arms (e.g.,  wheelchair or bedside chair)?: A Little Help needed to walk in hospital room?: A Little Help needed climbing 3-5 steps with a railing? : A Little 6 Click Score: 20    End of Session Equipment Utilized During Treatment: Gait belt;Oxygen(4 L/min) Activity Tolerance: Patient tolerated treatment well;Other (comment)(Tx limited by O2) Patient left: in bed;with call bell/phone within reach;with bed alarm set(Legs elevated 2/2 LE swelling) Nurse Communication: Mobility status PT Visit Diagnosis: Unsteadiness on feet (R26.81)     Time: 2585-2778 PT Time Calculation (min) (ACUTE ONLY): 28 min  Charges:                        Katherine Scott, SPT    Katherine Scott 02/05/2019, 4:58 PM

## 2019-02-05 NOTE — Progress Notes (Signed)
Titrated oxygen to 4 L per minute. Saturation at 84% on room air, up to 88% with 4 Liters.

## 2019-02-05 NOTE — Progress Notes (Signed)
[employer] - [place] at [facility]   PATIENT NAME: Katherine Scott    MR#:  [id number]  DATE OF BIRTH:  [birth year]  SUBJECTIVE:  Feels that shortness of breath is improved however still requiring large amount of oxygen via nasal cannula  REVIEW OF SYSTEMS:    Review of Systems  Constitutional: Negative for fever, chills weight loss HENT: Negative for ear pain, nosebleeds, congestion, facial swelling, rhinorrhea, neck pain, neck stiffness and ear discharge.   Respiratory: Negative for cough, ++IMPROVED shortness of breath, wheezing  Cardiovascular: Negative for chest pain, palpitations and +chronic leg swelling.  Gastrointestinal: Negative for heartburn, abdominal pain, vomiting, diarrhea or consitpation Genitourinary: Negative for dysuria, urgency, frequency, hematuria Musculoskeletal: Negative for back pain or joint pain Neurological: Negative for dizziness, seizures, syncope, focal weakness,  numbness and headaches.  Hematological: Does not bruise/bleed easily.  Psychiatric/Behavioral: Negative for hallucinations, confusion, dysphoric mood    Tolerating Diet: yes      DRUG ALLERGIES:  No Known Allergies  VITALS:  Blood pressure 138/79, pulse 68, temperature 97.7 F (36.5 C), temperature source Oral, resp. rate 16, height 5\' 3"  (1.6 m), weight (!) 165 kg, SpO2 99 %.  PHYSICAL EXAMINATION:  Constitutional: Appears morbidly obese. No distress. HENT: Normocephalic. Oropharynx is clear and moist.  Eyes: Conjunctivae and EOM are normal. PERRLA, no scleral icterus.  Neck: Normal ROM. Neck supple. No JVD. No tracheal deviation. CVS: IRR, IRR S1/S2 +, no murmurs, no gallops, no carotid bruit.  Pulmonary:decreaed throughout no wheezing crackles  Abdominal: Soft. BS +,  no distension, tenderness, rebound or guarding.  Musculoskeletal: Normal range of motion. 1+ LEE chronic and no tenderness.  Neuro: Alert. CN 2-12 grossly intact. No focal deficits. Skin: Skin is  warm and dry. No rash noted. Psychiatric: Normal mood and affect.      LABORATORY PANEL:   CBC Recent Labs  Lab 02/03/19 1545  WBC 14.7*  HGB 13.1  HCT 44.1  PLT 229   ------------------------------------------------------------------------------------------------------------------  Chemistries  Recent Labs  Lab 02/03/19 1545 02/04/19 0440  NA 140 140  K 3.9 5.1  CL 96* 99  CO2 31 32  GLUCOSE 131* 153*  BUN 20 20  CREATININE 0.97 0.87  CALCIUM 8.7* 8.5*  AST 19  --   ALT 28  --   ALKPHOS 88  --   BILITOT 1.4*  --    ------------------------------------------------------------------------------------------------------------------  Cardiac Enzymes No results for input(s): TROPONINI in the last 168 hours. ------------------------------------------------------------------------------------------------------------------  RADIOLOGY:  Dg Chest Port 1 View  Result Date: 02/03/2019 CLINICAL DATA:  Two-day history of progressively worsening shortness of breath and productive cough. Hypoxemia (oxygen saturation 71% on room air). Current history of COPD with CPAP usage. EXAM: PORTABLE CHEST 1 VIEW COMPARISON:  03/16/2010 FINDINGS: Cardiac silhouette moderately enlarged for AP portable technique with significant increase in heart size since 2011. Airspace opacities throughout both lungs, most confluent in the RIGHT UPPER LOBE. Mild pulmonary venous hypertension without overt pulmonary edema. No visible pleural effusions. IMPRESSION: 1. Acute airspace pneumonia throughout both lungs, most confluent in the RIGHT UPPER LOBE. 2. Moderate cardiomegaly. Electronically Signed   By: 2012 M.D.   On: 02/03/2019 16:27     ASSESSMENT AND PLAN:   58 y.o. female with pertinent past medical history of COPD, OSA on CPAP, atrial fibrillation on anticoagulation, morbid obesity, hypertension, hypothyroidism, hyperlipidemia, and arthritis presenting to the ED with chief complaints of  worsening shortness of breath.  1. Acute hypoxic respiratory failure due to  bilateral CAP. Wean O2 ok to keep O2 sats 90-92% Continue Rocephin and Doxycycline.  COVID and MRSA PCR negative.  2.  Acute on chronic COPD exacerbation due to pneumonia -  Continue steroids and Nebs  3. OSA on CPAP at night  4. Paroxysmal atrial fibrillation: Continue amiodarone and dilt for heart rate control Continue anticoagulation with Eliquis for CVA prevention  5. Hypothyroidism: Continue Synthroid   6. Chronic Bilateral lower extremity edema -no evidence of cellulitis Continue home Lasix  ECHO 2020 show normal ejection fraction and no mention of diastolic dysfunction Will try IV lasix overnight   PT RECS HHC at discharge  Management plans discussed with the patient and she is in agreement.  CODE STATUS: full  TOTAL TIME TAKING CARE OF THIS PATIENT: 29 minutes.     POSSIBLE D/C 2-4 days, DEPENDING ON CLINICAL CONDITION.   Bettey Costa M.D on 02/05/2019 at 11:48 AM  Between 7am to 6pm - Pager - [phone] After 6pm go to [email or web address] - password [email or web address] [facility]  Office  [phone]  CC: Primary care physician; Mikey College, NP  Note: This dictation was prepared with Dragon dictation along with smaller phrase technology. Any transcriptional errors that result from this process are unintentional.

## 2019-02-06 LAB — BASIC METABOLIC PANEL
Anion gap: 11 (ref 5–15)
BUN: 37 mg/dL — ABNORMAL HIGH (ref 6–20)
CO2: 34 mmol/L — ABNORMAL HIGH (ref 22–32)
Calcium: 8.5 mg/dL — ABNORMAL LOW (ref 8.9–10.3)
Chloride: 97 mmol/L — ABNORMAL LOW (ref 98–111)
Creatinine, Ser: 1.05 mg/dL — ABNORMAL HIGH (ref 0.44–1.00)
GFR calc Af Amer: >60 mL/min (ref >60)
GFR calc non Af Amer: 58 mL/min — ABNORMAL LOW (ref >60)
Glucose, Bld: 166 mg/dL — ABNORMAL HIGH (ref 70–99)
Potassium: 4.8 mmol/L (ref 3.5–5.1)
Sodium: 142 mmol/L (ref 135–145)

## 2019-02-06 MED ORDER — FUROSEMIDE 40 MG PO TABS
40.0000 mg | ORAL_TABLET | Freq: Every day | ORAL | Status: DC
  Administered 2019-02-07 – 2019-02-08 (×2): 40 mg via ORAL
  Filled 2019-02-06 (×2): qty 1

## 2019-02-06 NOTE — Progress Notes (Signed)
[employer] - [place] at [facility]   PATIENT NAME: Katherine Scott    MR#:  [id number]  DATE OF BIRTH:  1960-10-24  SUBJECTIVE:  Shortness of breath is much improved.  No wheezing this morning. Lower extremity swelling is improved  REVIEW OF SYSTEMS:    Review of Systems  Constitutional: Negative for fever, chills weight loss HENT: Negative for ear pain, nosebleeds, congestion, facial swelling, rhinorrhea, neck pain, neck stiffness and ear discharge.   Respiratory: Negative for cough, ++IMPROVED shortness of breath, no wheezing  Cardiovascular: Negative for chest pain, palpitations and +chronic leg swelling.  Gastrointestinal: Negative for heartburn, abdominal pain, vomiting, diarrhea or consitpation Genitourinary: Negative for dysuria, urgency, frequency, hematuria Musculoskeletal: Negative for back pain or joint pain Neurological: Negative for dizziness, seizures, syncope, focal weakness,  numbness and headaches.  Hematological: Does not bruise/bleed easily.  Psychiatric/Behavioral: Negative for hallucinations, confusion, dysphoric mood    Tolerating Diet: yes      DRUG ALLERGIES:  No Known Allergies  VITALS:  Blood pressure 138/68, pulse 85, temperature 97.8 F (36.6 C), temperature source Oral, resp. rate 20, height 5\' 3"  (1.6 m), weight (!) 165 kg, SpO2 90 %.  PHYSICAL EXAMINATION:  Constitutional: Appears morbidly obese. No distress. HENT: Normocephalic. Oropharynx is clear and moist.  Eyes: Conjunctivae and EOM are normal. PERRLA, no scleral icterus.  Neck: Normal ROM. Neck supple. No JVD. No tracheal deviation. CVS: IRR, IRR S1/S2 +, no murmurs, no gallops, no carotid bruit.  Pulmonary:decreaed throughout no wheezing crackles  Abdominal: Soft. BS +,  no distension, tenderness, rebound or guarding.  Musculoskeletal: Normal range of motion. 1+ LEE chronic and no tenderness.  Neuro: Alert. CN 2-12 grossly intact. No focal deficits. Skin: Skin is  warm and dry. No rash noted. Psychiatric: Normal mood and affect.      LABORATORY PANEL:   CBC Recent Labs  Lab 02/03/19 1545  WBC 14.7*  HGB 13.1  HCT 44.1  PLT 229   ------------------------------------------------------------------------------------------------------------------  Chemistries  Recent Labs  Lab 02/03/19 1545  02/06/19 0529  NA 140   < > 142  K 3.9   < > 4.8  CL 96*   < > 97*  CO2 31   < > 34*  GLUCOSE 131*   < > 166*  BUN 20   < > 37*  CREATININE 0.97   < > 1.05*  CALCIUM 8.7*   < > 8.5*  AST 19  --   --   ALT 28  --   --   ALKPHOS 88  --   --   BILITOT 1.4*  --   --    < > = values in this interval not displayed.   ------------------------------------------------------------------------------------------------------------------  Cardiac Enzymes No results for input(s): TROPONINI in the last 168 hours. ------------------------------------------------------------------------------------------------------------------  RADIOLOGY:  No results found.   ASSESSMENT AND PLAN:   58 y.o. female with pertinent past medical history of COPD, OSA on CPAP, atrial fibrillation on anticoagulation, morbid obesity, hypertension, hypothyroidism, hyperlipidemia, and arthritis presenting to the ED with chief complaints of worsening shortness of breath.  1. Acute hypoxic respiratory failure due to bilateral CAP. Continue to wean oxygen as tolerated.  She is down to 4 L this morning.   Continue Rocephin and Doxycycline.  COVID and MRSA PCR negative.  2.  Acute on chronic COPD exacerbation due to pneumonia -  Continue current dose of IV steroids and Nebs  3. OSA on CPAP at night  4. Paroxysmal atrial fibrillation:  Continue amiodarone and dilt for heart rate control continue anticoagulation with Eliquis for CVA prevention  5. Hypothyroidism: Continue Synthroid   6. Chronic Bilateral lower extremity edema -no evidence of cellulitis Continue home Lasix   ECHO 2020 show normal ejection fraction and no mention of diastolic dysfunction   PT RECS HHC at discharge  Management plans discussed with the patient and she is in agreement.  CODE STATUS: full  TOTAL TIME TAKING CARE OF THIS PATIENT: 26 minutes.     POSSIBLE D/C 1-2 days, DEPENDING ON CLINICAL CONDITION.   Bettey Costa M.D on 02/06/2019 at 10:40 AM  Between 7am to 6pm - Pager - [phone] After 6pm go to [email or web address] - password [email or web address] [facility]  Office  [phone]  CC: Primary care physician; Mikey College, NP  Note: This dictation was prepared with Dragon dictation along with smaller phrase technology. Any transcriptional errors that result from this process are unintentional.

## 2019-02-07 LAB — CBC
HCT: 40 % (ref 36.0–46.0)
Hemoglobin: 11.6 g/dL — ABNORMAL LOW (ref 12.0–15.0)
MCH: 26.4 pg (ref 26.0–34.0)
MCHC: 29 g/dL — ABNORMAL LOW (ref 30.0–36.0)
MCV: 91.1 fL (ref 80.0–100.0)
Platelets: 246 10*3/uL (ref 150–400)
RBC: 4.39 MIL/uL (ref 3.87–5.11)
RDW: 14.6 % (ref 11.5–15.5)
WBC: 8 10*3/uL (ref 4.0–10.5)
nRBC: 0.4 % — ABNORMAL HIGH (ref 0.0–0.2)

## 2019-02-07 LAB — BASIC METABOLIC PANEL
Anion gap: 11 (ref 5–15)
BUN: 33 mg/dL — ABNORMAL HIGH (ref 6–20)
CO2: 34 mmol/L — ABNORMAL HIGH (ref 22–32)
Calcium: 8.7 mg/dL — ABNORMAL LOW (ref 8.9–10.3)
Chloride: 96 mmol/L — ABNORMAL LOW (ref 98–111)
Creatinine, Ser: 0.86 mg/dL (ref 0.44–1.00)
GFR calc Af Amer: >60 mL/min (ref >60)
GFR calc non Af Amer: >60 mL/min (ref >60)
Glucose, Bld: 175 mg/dL — ABNORMAL HIGH (ref 70–99)
Potassium: 4.8 mmol/L (ref 3.5–5.1)
Sodium: 141 mmol/L (ref 135–145)

## 2019-02-07 MED ORDER — CEFDINIR 300 MG PO CAPS
300.0000 mg | ORAL_CAPSULE | Freq: Two times a day (BID) | ORAL | Status: DC
  Administered 2019-02-07 – 2019-02-08 (×3): 300 mg via ORAL
  Filled 2019-02-07 (×4): qty 1

## 2019-02-07 MED ORDER — METHYLPREDNISOLONE SODIUM SUCC 40 MG IJ SOLR: 40.0000 mg | Freq: Every day | INTRAMUSCULAR | Status: DC

## 2019-02-07 NOTE — Progress Notes (Signed)
[employer] - [place] at [facility]   PATIENT NAME: Katherine Scott    MR#:  [id number]  DATE OF BIRTH:  08-12-1960  SUBJECTIVE:  Patient doing so much better this morning. Her symptoms have improved but she still requiring 3 to 4 L of oxygen.  REVIEW OF SYSTEMS:    Review of Systems  Constitutional: Negative for fever, chills weight loss HENT: Negative for ear pain, nosebleeds, congestion, facial swelling, rhinorrhea, neck pain, neck stiffness and ear discharge.   Respiratory: Negative for cough, ++IMPROVED shortness of breath, no wheezing  Cardiovascular: Negative for chest pain, palpitations and +chronic leg swelling.  Gastrointestinal: Negative for heartburn, abdominal pain, vomiting, diarrhea or consitpation Genitourinary: Negative for dysuria, urgency, frequency, hematuria Musculoskeletal: Negative for back pain or joint pain Neurological: Negative for dizziness, seizures, syncope, focal weakness,  numbness and headaches.  Hematological: Does not bruise/bleed easily.  Psychiatric/Behavioral: Negative for hallucinations, confusion, dysphoric mood    Tolerating Diet: yes      DRUG ALLERGIES:  No Known Allergies  VITALS:  Blood pressure (!) 142/87, pulse 86, temperature 97.7 F (36.5 C), temperature source Oral, resp. rate 20, height 5\' 3"  (1.6 m), weight (!) 165 kg, SpO2 91 %.  PHYSICAL EXAMINATION:  Constitutional: Appears morbidly obese. No distress. HENT: Normocephalic. Oropharynx is clear and moist.  Eyes: Conjunctivae and EOM are normal. PERRLA, no scleral icterus.  Neck: Normal ROM. Neck supple. No JVD. No tracheal deviation. CVS: IRR, IRR S1/S2 +, no murmurs, no gallops, no carotid bruit.  Pulmonary:decreaed throughout no wheezing crackles  Abdominal: Soft. BS +,  no distension, tenderness, rebound or guarding.  Musculoskeletal: Normal range of motion. 1+ LEE chronic and no tenderness.  Neuro: Alert. CN 2-12 grossly intact. No focal  deficits. Skin: Skin is warm and dry. No rash noted. Psychiatric: Normal mood and affect.      LABORATORY PANEL:   CBC Recent Labs  Lab 02/07/19 0405  WBC 8.0  HGB 11.6*  HCT 40.0  PLT 246   ------------------------------------------------------------------------------------------------------------------  Chemistries  Recent Labs  Lab 02/03/19 1545  02/07/19 0405  NA 140   < > 141  K 3.9   < > 4.8  CL 96*   < > 96*  CO2 31   < > 34*  GLUCOSE 131*   < > 175*  BUN 20   < > 33*  CREATININE 0.97   < > 0.86  CALCIUM 8.7*   < > 8.7*  AST 19  --   --   ALT 28  --   --   ALKPHOS 88  --   --   BILITOT 1.4*  --   --    < > = values in this interval not displayed.   ------------------------------------------------------------------------------------------------------------------  Cardiac Enzymes No results for input(s): TROPONINI in the last 168 hours. ------------------------------------------------------------------------------------------------------------------  RADIOLOGY:  No results found.   ASSESSMENT AND PLAN:   58 y.o. female with pertinent past medical history of COPD, OSA on CPAP, atrial fibrillation on anticoagulation, morbid obesity, hypertension, hypothyroidism, hyperlipidemia, and arthritis presenting to the ED with chief complaints of worsening shortness of breath.  1. Acute hypoxic respiratory failure due to bilateral CAP.  COVID and MRSA PCR negative. Continue to wean oxygen.  She will need home oxygen at discharge.  I have transitioned her to oral cefdinir and doxycycline to complete course of treatment.   2.  Acute on chronic COPD exacerbation due to pneumonia -  I have decreased dose of Solu-Medrol to  40 mg IV daily.  Plan to transition to oral prednisone tomorrow.  She will need 3 to 4 days of oral prednisone at discharge.    3. OSA on CPAP at night  4. Paroxysmal atrial fibrillation: Continue amiodarone and dilt for heart rate control  continue anticoagulation with Eliquis for CVA prevention  5. Hypothyroidism: Continue Synthroid   6. Chronic Bilateral lower extremity edema -no evidence of cellulitis Continue home Lasix  ECHO 2020 show normal ejection fraction and no mention of diastolic dysfunction    Management plans discussed with the patient and she is in agreement.  CODE STATUS: full  TOTAL TIME TAKING CARE OF THIS PATIENT: 26 minutes.  Patient can be discharged tomorrow with home health and home oxygen.   POSSIBLE D/C tomorrow DEPENDING ON CLINICAL CONDITION.   Bettey Costa M.D on 02/07/2019 at 9:23 AM  Between 7am to 6pm - Pager - [phone] After 6pm go to [email or web address] - password [email or web address] [facility]  Office  [phone]  CC: Primary care physician; Mikey College, NP  Note: This dictation was prepared with Dragon dictation along with smaller phrase technology. Any transcriptional errors that result from this process are unintentional.

## 2019-02-07 NOTE — Progress Notes (Signed)
PT Cancellation Note  Patient Details Name: Katherine Scott MRN: [id number] DOB: 1960-04-14   Cancelled Treatment:    Reason Eval/Treat Not Completed: Other (comment)   Pt stated she recently returned to bed.  Has been up walking around room and completed ADL's in bathroom without AD or assist.  Feels she is making good progress in regards to mobility and declines intervention at this time.   Chesley Noon 02/07/2019, 12:37 PM

## 2019-02-08 LAB — CULTURE, BLOOD (ROUTINE X 2)
Culture: NO GROWTH
Culture: NO GROWTH
Special Requests: ADEQUATE

## 2019-02-08 MED ORDER — CEFDINIR 300 MG PO CAPS: 300.0000 mg | ORAL_CAPSULE | Freq: Two times a day (BID) | ORAL | 0 refills | Status: AC

## 2019-02-08 MED ORDER — BENZONATATE 100 MG PO CAPS: 100.0000 mg | ORAL_CAPSULE | Freq: Three times a day (TID) | ORAL | 0 refills | Status: DC | PRN

## 2019-02-08 MED ORDER — DOXYCYCLINE HYCLATE 100 MG PO TABS: 100.0000 mg | ORAL_TABLET | Freq: Two times a day (BID) | ORAL | 0 refills | Status: AC

## 2019-02-08 MED ORDER — PREDNISONE 20 MG PO TABS: 40.0000 mg | ORAL_TABLET | Freq: Every day | ORAL | 0 refills | Status: AC

## 2019-02-08 MED ORDER — PREDNISONE 50 MG PO TABS
50.0000 mg | ORAL_TABLET | Freq: Every day | ORAL | Status: DC
  Administered 2019-02-08: 50 mg via ORAL
  Filled 2019-02-08: qty 1

## 2019-02-08 NOTE — TOC Transition Note (Signed)
Transition of Care [facility]) - CM/SW Discharge Note   Patient Details  Name: Katherine Scott MRN: [id number] Date of Birth: [birth year]  Transition of Care [facility]) CM/SW Contact:  Latanya Maudlin, RN Phone Number: 02/08/2019, 12:55 PM   Clinical Narrative:   Patient to be discharged per MD order. Orders in place for home health services. Patient refuses home health as she feels she can safely ambulate and manage her activities of daily living. DME orders for oxygen. Referral placed with [email or web address] for tank delivery to hospital and then home set up once patient discharges.     Final next level of care: [place] Barriers to Discharge: No Barriers Identified   Patient Goals and CMS Choice     Choice offered to / list presented to : Patient  Discharge Placement                       Discharge Plan and Services                DME Arranged: Oxygen DME Agency: [facility] Date DME Agency Contacted: 02/08/19 Time DME Agency Contacted: [id number] Representative spoke with at DME Agency: Caryl Pina            Social Determinants of Health ([place]) Interventions     Readmission Risk Interventions Readmission Risk Prevention Plan 02/08/2019  Post Dischage Appt Complete  Medication Screening Complete  Transportation Screening Complete  Some recent data might be hidden

## 2019-02-08 NOTE — Discharge Instructions (Signed)
HHPT °

## 2019-02-08 NOTE — Progress Notes (Signed)
SATURATION QUALIFICATIONS: (This note is used to comply with regulatory documentation for home oxygen)  Patient Saturations on Room Air at Rest = 91%  Patient Saturations on Room Air while Ambulating = 72%  Patient Saturations on 1 Liters of oxygen while Ambulating =95%  Please briefly explain why patient needs home oxygen:

## 2019-02-08 NOTE — Discharge Summary (Signed)
[employer] [facility] - [place] [facility]   PATIENT NAME: Katherine Scott    MR#:  [id number]  DATE OF BIRTH:  1960/08/03  DATE OF ADMISSION:  02/03/2019   ADMITTING PHYSICIAN: Jimmye NormanElizabeth Achieng Ouma, NP  DATE OF DISCHARGE: No discharge date for patient encounter.  PRIMARY CARE PHYSICIAN: Galen ManilaKennedy, Lauren Renee, NP   ADMISSION DIAGNOSIS:  Acute respiratory failure with hypoxia (HCC) [J96.01] Community acquired pneumonia, unspecified laterality [J18.9] DISCHARGE DIAGNOSIS:  Active Problems:   Bilateral pneumonia  SECONDARY DIAGNOSIS:   Past Medical History:  Diagnosis Date  . Abnormal mammogram, unspecified 2013  . Atrial fibrillation (HCC)    2013  . Breast screening, unspecified   . COPD (chronic obstructive pulmonary disease) (HCC)   . History of continuous positive airway pressure (CPAP) therapy at home   . Obesity, unspecified   . Screening for obesity   . Special screening for malignant neoplasms, colon   . Unspecified essential hypertension    HOSPITAL COURSE:  58 y.o.femalewith pertinent past medical history ofCOPD,OSA on CPAP, atrial fibrillation on anticoagulation, morbid obesity, hypertension, hypothyroidism, hyperlipidemia, and arthritis presenting to the ED with chief complaints of worsening shortness of breath.  1.Acutehypoxic respiratory failure due to bilateral CAP.  COVID and MRSA PCR negative. Continue oxygen [place] 2 L.  She will need home oxygen at discharge. Continue oral cefdinir and doxycycline to complete course of treatment.   2.Acute on chronic COPD exacerbation due to pneumonia -  The patient was treated with IV Solu-Medrol.  Change to prednisone. She will need 3 to 4 days of oral prednisone at discharge.    3.OSA on CPAP at night  4.Paroxysmal atrial fibrillation: Continue amiodarone and dilt for heart rate control continue anticoagulation with Eliquis for CVA prevention  5.Hypothyroidism: Continue Synthroid   6.Chronic Bilateral lower extremity edema-no evidence of cellulitis Continue home Lasix  ECHO 2020 show normal ejection fraction and no mention of diastolic dysfunction  DISCHARGE CONDITIONS:  Stable, discharge home with home health today. CONSULTS OBTAINED:   DRUG ALLERGIES:  No Known Allergies DISCHARGE MEDICATIONS:   Allergies as of 02/08/2019   No Known Allergies     Medication List    TAKE these medications   albuterol 108 (90 Base) MCG/ACT inhaler Commonly known as: VENTOLIN HFA Inhale 2 puffs into the lungs every 6 (six) hours as needed for wheezing.   amiodarone 200 MG tablet Commonly known as: PACERONE Take 200-400 mg by mouth 2 (two) times daily.   benzonatate 100 MG capsule Commonly known as: Tessalon Perles Take 1 capsule (100 mg total) by mouth 3 (three) times daily as needed for cough.   cefdinir 300 MG capsule Commonly known as: OMNICEF Take 1 capsule (300 mg total) by mouth every 12 (twelve) hours for 3 days.   cyanocobalamin 2000 MCG tablet Take 2,000 mcg by mouth daily.   diltiazem 240 MG 24 hr capsule Commonly known as: CARDIZEM CD Take 240 mg by mouth daily after breakfast.   dofetilide 500 MCG capsule Commonly known as: TIKOSYN Take 500 mcg by mouth every 12 (twelve) hours.   doxycycline 100 MG tablet Commonly known as: VIBRA-TABS Take 1 tablet (100 mg total) by mouth every 12 (twelve) hours for 3 days.   Eliquis 5 MG Tabs tablet Generic drug: apixaban Take 5 mg by mouth 2 (two) times daily.   furosemide 40 MG tablet Commonly known as: LASIX Take 40 mg by mouth daily.   levothyroxine 75 MCG tablet Commonly known as: SYNTHROID Take 1  tablet (75 mcg total) by mouth daily before breakfast.   naproxen sodium 220 MG tablet Commonly known as: ALEVE Take 440 mg by mouth 2 (two) times daily.   potassium chloride SA 20 MEQ tablet Commonly known as: KLOR-CON Take 1 tablet (20 mEq total) by mouth daily. What changed: when to take this    predniSONE 20 MG tablet Commonly known as: DELTASONE Take 2 tablets (40 mg total) by mouth daily with breakfast for 4 days.   Spacer/Aero-Holding [employer] 1 Device by Does not apply route as needed.        DISCHARGE INSTRUCTIONS:  See AVS.  If you experience worsening of your admission symptoms, develop shortness of breath, life threatening emergency, suicidal or homicidal thoughts you must seek medical attention immediately by calling 911 or calling your MD immediately  if symptoms less severe.  You Must read complete instructions/literature along with all the possible adverse reactions/side effects for all the Medicines you take and that have been prescribed to you. Take any new Medicines after you have completely understood and accpet all the possible adverse reactions/side effects.   Please note  You were cared for by a hospitalist during your hospital stay. If you have any questions about your discharge medications or the care you received while you were in the hospital after you are discharged, you can call the unit and asked to speak with the hospitalist on call if the hospitalist that took care of you is not available. Once you are discharged, your primary care physician will handle any further medical issues. Please note that NO REFILLS for any discharge medications will be authorized once you are discharged, as it is imperative that you return to your primary care physician (or establish a relationship with a primary care physician if you do not have one) for your aftercare needs so that they can reassess your need for medications and monitor your lab values.    On the day of Discharge:  VITAL SIGNS:  Blood pressure (!) 145/88, pulse 82, temperature (!) 97.5 F (36.4 C), temperature source Axillary, resp. rate 20, height 5\' 3"  (1.6 m), weight (!) 165 kg, SpO2 95 %. PHYSICAL EXAMINATION:  GENERAL:  58 y.o.-year-old patient lying in the bed with no acute distress.   EYES: Pupils equal, round, reactive to light and accommodation. No scleral icterus. Extraocular muscles intact.  HEENT: Head atraumatic, normocephalic.  NECK:  Supple, no jugular venous distention. No thyroid enlargement, no tenderness.  LUNGS: Normal breath sounds bilaterally, no wheezing, rales,rhonchi or crepitation. No use of accessory muscles of respiration.  CARDIOVASCULAR: S1, S2 normal. No murmurs, rubs, or gallops.  ABDOMEN: Soft, non-tender, non-distended. Bowel sounds present. No organomegaly or mass.  EXTREMITIES: No pedal edema, cyanosis, or clubbing.  NEUROLOGIC: Cranial nerves II through XII are intact. Muscle strength 5/5 in all extremities. Sensation intact. Gait not checked.  PSYCHIATRIC: The patient is alert and oriented x 3.  SKIN: No obvious rash, lesion, or ulcer.  DATA REVIEW:   CBC Recent Labs  Lab 02/07/19 0405  WBC 8.0  HGB 11.6*  HCT 40.0  PLT 246    Chemistries  Recent Labs  Lab 02/03/19 1545  02/07/19 0405  NA 140   < > 141  K 3.9   < > 4.8  CL 96*   < > 96*  CO2 31   < > 34*  GLUCOSE 131*   < > 175*  BUN 20   < > 33*  CREATININE 0.97   < >  0.86  CALCIUM 8.7*   < > 8.7*  AST 19  --   --   ALT 28  --   --   ALKPHOS 88  --   --   BILITOT 1.4*  --   --    < > = values in this interval not displayed.     Microbiology Results  Results for orders placed or performed during the hospital encounter of 02/03/19  Blood Culture (routine x 2)     Status: None   Collection Time: 02/03/19  3:44 PM   Specimen: BLOOD  Result Value Ref Range Status   Specimen Description BLOOD LEFT ANTECUBITAL  Final   Special Requests   Final    BOTTLES DRAWN AEROBIC AND ANAEROBIC Blood Culture results may not be optimal due to an excessive volume of blood received in culture bottles   Culture   Final    NO GROWTH 5 DAYS Performed at [facility], [address], [place], [place] [address]    Report Status 02/08/2019 FINAL  Final  SARS Coronavirus 2 by  RT PCR (hospital order, performed in [facility] hospital lab) Nasopharyngeal Nasopharyngeal Swab     Status: None   Collection Time: 02/03/19  3:44 PM   Specimen: Nasopharyngeal Swab  Result Value Ref Range Status   SARS Coronavirus 2 NEGATIVE NEGATIVE Final    Comment: (NOTE) If result is NEGATIVE SARS-CoV-2 target nucleic acids are NOT DETECTED. The SARS-CoV-2 RNA is generally detectable in upper and lower  respiratory specimens during the acute phase of infection. The lowest  concentration of SARS-CoV-2 viral copies this assay can detect is 250  copies / mL. A negative result does not preclude SARS-CoV-2 infection  and should not be used as the sole basis for treatment or other  patient management decisions.  A negative result may occur with  improper specimen collection / handling, submission of specimen other  than nasopharyngeal swab, presence of viral mutation(s) within the  areas targeted by this assay, and inadequate number of viral copies  (<250 copies / mL). A negative result must be combined with clinical  observations, patient history, and epidemiological information. If result is POSITIVE SARS-CoV-2 target nucleic acids are DETECTED. The SARS-CoV-2 RNA is generally detectable in upper and lower  respiratory specimens dur ing the acute phase of infection.  Positive  results are indicative of active infection with SARS-CoV-2.  Clinical  correlation with patient history and other diagnostic information is  necessary to determine patient infection status.  Positive results do  not rule out bacterial infection or co-infection with other viruses. If result is PRESUMPTIVE POSTIVE SARS-CoV-2 nucleic acids MAY BE PRESENT.   A presumptive positive result was obtained on the submitted specimen  and confirmed on repeat testing.  While 2019 novel coronavirus  (SARS-CoV-2) nucleic acids may be present in the submitted sample  additional confirmatory testing may be necessary for  epidemiological  and / or clinical management purposes  to differentiate between  SARS-CoV-2 and other Sarbecovirus currently known to infect humans.  If clinically indicated additional testing with an alternate test  methodology [id number]) is advised. The SARS-CoV-2 RNA is generally  detectable in upper and lower respiratory sp ecimens during the acute  phase of infection. The expected result is Negative. Fact Sheet for Patients:  BoilerBrush.com.cy Fact Sheet for Healthcare Providers: https://pope.com/ This test is not yet approved or cleared by the [place] FDA and has been authorized for detection and/or diagnosis of SARS-CoV-2 by FDA  under an Emergency Use Authorization (EUA).  This EUA will remain in effect (meaning this test can be used) for the duration of the COVID-19 declaration under Section 564(b)(1) of the Act, 21 U.S.C. section 360bbb-3(b)(1), unless the authorization is terminated or revoked sooner. Performed at [facility], [address] [place], [place] [address]   Blood Culture (routine x 2)     Status: None   Collection Time: 02/03/19  3:45 PM   Specimen: BLOOD  Result Value Ref Range Status   Specimen Description BLOOD RIGHT ANTECUBITAL  Final   Special Requests   Final    BOTTLES DRAWN AEROBIC AND ANAEROBIC Blood Culture adequate volume   Culture   Final    NO GROWTH 5 DAYS Performed at [facility], [address] [place], [place] [address]    Report Status 02/08/2019 FINAL  Final  MRSA PCR Screening     Status: None   Collection Time: 02/04/19  4:52 AM   Specimen: Nasal Mucosa; Nasopharyngeal  Result Value Ref Range Status   MRSA by PCR NEGATIVE NEGATIVE Final    Comment:        The GeneXpert MRSA Assay (FDA approved for NASAL specimens only), is one component of a comprehensive MRSA colonization surveillance program. It is not intended to diagnose MRSA infection nor to  guide or monitor treatment for MRSA infections. Performed at [facility], [address], [place], [place] [address]   Urine culture     Status: None   Collection Time: 02/04/19  8:00 AM   Specimen: In/Out Cath Urine  Result Value Ref Range Status   Specimen Description   Final    IN/OUT CATH URINE Performed at [facility], [address], [place], [place] [address]    Special Requests   Final    NONE Performed at [facility], [address], [place], [place] [address]    Culture   Final    NO GROWTH Performed at [facility], [address] [place], [place] [address]    Report Status 02/05/2019 FINAL  Final    RADIOLOGY:  No results found.   Management plans discussed with the patient, family and they are in agreement.  CODE STATUS: Full Code   TOTAL TIME TAKING CARE OF THIS PATIENT: 33 minutes.    Shaune Pollack M.D on 02/08/2019 at 10:58 AM  Between 7am to 6pm - Pager - [phone]  After 6pm go to [email or web address] - password [email or web address] [facility]  [employer]  Hospitalists  Office  [phone]  CC: Primary care physician; Galen Manila, NP   Note: This dictation was prepared with Dragon dictation along with smaller phrase technology. Any transcriptional errors that result from this process are unintentional.

## 2019-02-08 NOTE — Progress Notes (Signed)
SATURATION QUALIFICATIONS: (This note is used to comply with regulatory documentation for home oxygen)  Patient Saturations on Room Air while Ambulating = 72%  Patient Saturations on 1 Liters of oxygen while Ambulating = 95%  Please briefly explain why patient needs home oxygen: Patient desaturates with exertion on room air

## 2019-02-08 NOTE — Progress Notes (Signed)
Discharge instructions reviewed with patient including followup visits and new medications.  Understanding was verbalized and all questions were answered.  IV removed without complication; patient tolerated well.  Transitioned to home portable O2.  Patient discharged home via wheelchair in stable condition escorted by nursing staff.

## 2019-02-14 ENCOUNTER — Other Ambulatory Visit: Payer: Self-pay

## 2019-02-14 ENCOUNTER — Encounter: Payer: Self-pay | Admitting: Family Medicine

## 2019-02-14 ENCOUNTER — Ambulatory Visit (INDEPENDENT_AMBULATORY_CARE_PROVIDER_SITE_OTHER): Payer: BC Managed Care – PPO | Admitting: Family Medicine

## 2019-02-14 VITALS — BP 125/50 | HR 78 | Temp 99.2°F | Resp 16 | Ht 62.0 in | Wt 353.0 lb

## 2019-02-14 DIAGNOSIS — E662 Morbid (severe) obesity with alveolar hypoventilation: Secondary | ICD-10-CM

## 2019-02-14 DIAGNOSIS — J9601 Acute respiratory failure with hypoxia: Secondary | ICD-10-CM | POA: Diagnosis not present

## 2019-02-14 DIAGNOSIS — J189 Pneumonia, unspecified organism: Secondary | ICD-10-CM | POA: Diagnosis not present

## 2019-02-14 MED ORDER — ANORO ELLIPTA 62.5-25 MCG/INH IN AEPB: 1.0000 | INHALATION_SPRAY | Freq: Every day | RESPIRATORY_TRACT | 0 refills | Status: DC

## 2019-02-14 MED ORDER — PREDNISONE 20 MG PO TABS: 40.0000 mg | ORAL_TABLET | Freq: Every day | ORAL | 0 refills | Status: DC

## 2019-02-14 NOTE — Progress Notes (Signed)
Subjective:    [name], female    DOB: 1960/09/11, 58 y.o.   MRN: [id number]  Katherine Scott is a 58 y.o. female presenting on 02/14/2019 for Hospitalization Follow-up (Acute hypoxic respiratory failure due to bilateral CAP)   HPI  HOSPITAL FOLLOW-UP VISIT  Hospital/Location: [facility] Date of Admission: 02/03/19 Date of Discharge: 02/08/19 Transitions of care telephone call: No TCC completed.  Reason for Admission: Acute respiratory failure / PNA  - Hospital H&P and Discharge Summary have been reviewed - Patient presents today 6 days  after recent hospitalization. Brief summary of recent course, patient had symptoms of worsening dyspnea, had chronic worse dyspnea over few months then acute worse, hypoxic resp failure at cardiology office sent to ED and found to have bilateral pneumonia, thought COPD exacerbation as well, treated in hospital with oxygen supplement, IV antibiotics, IV steroids, and transitioned to oral steroid and antibiotic.  - Today reports overall has shown gradual improvement after discharge, maybe 40% improved but still dyspnea if significant exertion. She has cardiology and PAF has been managed for this as well but not thought to be major cause of her dyspnea. She had cardioversion that was unsuccessful.  Completed Cefdinir, Doxycycline for 3 days after discharge Still has albuterol RPN She is on home oxygen, states it took her an hour to take a shower  She has home health assistance.  Admits dyspnea on exertion Denies any fevers chills chest pain  I have reviewed the discharge medication list, and have reconciled the current and discharge medications today.   Current Outpatient Medications:  .  albuterol (VENTOLIN HFA) 108 (90 Base) MCG/ACT inhaler, Inhale 2 puffs into the lungs every 6 (six) hours as needed for wheezing., Disp: , Rfl:  .  amiodarone (PACERONE) 200 MG tablet, Take 200-400 mg by mouth 2 (two) times daily., Disp: , Rfl:  .   apixaban (ELIQUIS) 5 MG TABS tablet, Take 5 mg by mouth 2 (two) times daily. , Disp: , Rfl:  .  benzonatate (TESSALON PERLES) 100 MG capsule, Take 1 capsule (100 mg total) by mouth 3 (three) times daily as needed for cough., Disp: 30 capsule, Rfl: 0 .  cyanocobalamin 2000 MCG tablet, Take 2,000 mcg by mouth daily., Disp: , Rfl:  .  diltiazem (CARDIZEM CD) 240 MG 24 hr capsule, Take 240 mg by mouth daily after breakfast., Disp: , Rfl:  .  dofetilide (TIKOSYN) 500 MCG capsule, Take 500 mcg by mouth every 12 (twelve) hours. , Disp: , Rfl:  .  furosemide (LASIX) 40 MG tablet, Take 40 mg by mouth daily., Disp: , Rfl: 5 .  levothyroxine (SYNTHROID) 75 MCG tablet, Take 1 tablet (75 mcg total) by mouth daily before breakfast., Disp: 90 tablet, Rfl: 1 .  naproxen sodium (ANAPROX) 220 MG tablet, Take 440 mg by mouth 2 (two) times daily. , Disp: , Rfl:  .  potassium chloride SA (K-DUR,KLOR-CON) 20 MEQ tablet, Take 1 tablet (20 mEq total) by mouth daily. (Patient taking differently: Take 20 mEq by mouth at bedtime. ), Disp: 30 tablet, Rfl: 6 .  Spacer/Aero-Holding Rudean Curt, 1 Device by Does not apply route as needed., Disp: 1 each, Rfl: 0 .  ANORO ELLIPTA 62.5-25 MCG/INH AEPB, Inhale 1 puff into the lungs daily., Disp: 14 each, Rfl: 0 .  predniSONE (DELTASONE) 20 MG tablet, Take 2 tablets (40 mg total) by mouth daily with breakfast. For up to 5 days, Disp: 10 tablet, Rfl: 0  ------------------------------------------------------------------------- Social History  Tobacco Use  . Smoking status: Never Smoker  . Smokeless tobacco: Never Used  Substance Use Topics  . Alcohol use: No  . Drug use: No    Review of Systems Per HPI unless specifically indicated above     Objective:    BP (!) 125/50   Pulse 78   Temp 99.2 F (37.3 C) (Oral)   Resp 16   Ht 5\' 2"  (1.575 m)   Wt (!) 353 lb (160.1 kg)   SpO2 91% Comment: fluctuate between 91-93, on 2 liter of oxygen  BMI 64.56 kg/m   Wt Readings  from Last 3 Encounters:  02/14/19 (!) 353 lb (160.1 kg)  02/03/19 (!) 363 lb 12.1 oz (165 kg)  01/09/19 (!) 353 lb (160.1 kg)    Physical Exam Vitals signs and nursing note reviewed.  Constitutional:      General: She is not in acute distress.    Appearance: She is well-developed. She is not diaphoretic.     Comments: Well-appearing, comfortable, cooperative, obese  HENT:     Head: Normocephalic and atraumatic.  Eyes:     General:        Right eye: No discharge.        Left eye: No discharge.     Conjunctiva/sclera: Conjunctivae normal.  Neck:     Musculoskeletal: Normal range of motion and neck supple.     Thyroid: No thyromegaly.  Cardiovascular:     Rate and Rhythm: Normal rate and regular rhythm.     Heart sounds: Normal heart sounds. No murmur.  Pulmonary:     Effort: Pulmonary effort is normal. No respiratory distress.     Breath sounds: No wheezing or rales.     Comments: Reduced air movement diffuse but overall mostly preserved Musculoskeletal: Normal range of motion.  Lymphadenopathy:     Cervical: No cervical adenopathy.  Skin:    General: Skin is warm and dry.     Findings: No erythema or rash.  Neurological:     Mental Status: She is alert and oriented to person, place, and time.  Psychiatric:        Behavior: Behavior normal.     Comments: Well groomed, good eye contact, normal speech and thoughts       Results for orders placed or performed during the hospital encounter of 02/03/19  Blood Culture (routine x 2)   Specimen: BLOOD  Result Value Ref Range   Specimen Description BLOOD RIGHT ANTECUBITAL    Special Requests      BOTTLES DRAWN AEROBIC AND ANAEROBIC Blood Culture adequate volume   Culture      NO GROWTH 5 DAYS Performed at [facility], [address] [place], [place] [address]    Report Status 02/08/2019 FINAL   Blood Culture (routine x 2)   Specimen: BLOOD  Result Value Ref Range   Specimen Description BLOOD LEFT ANTECUBITAL     Special Requests      BOTTLES DRAWN AEROBIC AND ANAEROBIC Blood Culture results may not be optimal due to an excessive volume of blood received in culture bottles   Culture      NO GROWTH 5 DAYS Performed at [facility], [address] [place], [place] [address]    Report Status 02/08/2019 FINAL   Urine culture   Specimen: In/Out Cath Urine  Result Value Ref Range   Specimen Description      IN/OUT CATH URINE Performed at [facility], [address] [place], [place]  [id number]    Special Requests      NONE Performed at [facility], [address] [place], [place] [address]    Culture      NO GROWTH Performed at [facility], [address] [place], [place] [address]    Report Status 02/05/2019 FINAL   SARS Coronavirus 2 by RT PCR (hospital order, performed in [facility] hospital lab) Nasopharyngeal Nasopharyngeal Swab   Specimen: Nasopharyngeal Swab  Result Value Ref Range   SARS Coronavirus 2 NEGATIVE NEGATIVE  MRSA PCR Screening   Specimen: Nasal Mucosa; Nasopharyngeal  Result Value Ref Range   MRSA by PCR NEGATIVE NEGATIVE  Lactic acid, plasma  Result Value Ref Range   Lactic Acid, Venous 1.2 0.5 - 1.9 mmol/L  Comprehensive metabolic panel  Result Value Ref Range   Sodium 140 135 - 145 mmol/L   Potassium 3.9 3.5 - 5.1 mmol/L   Chloride 96 (L) 98 - 111 mmol/L   CO2 31 22 - 32 mmol/L   Glucose, Bld 131 (H) 70 - 99 mg/dL   BUN 20 6 - 20 mg/dL   Creatinine, Ser 5.620.97 0.44 - 1.00 mg/dL   Calcium 8.7 (L) 8.9 - 10.3 mg/dL   Total Protein 8.0 6.5 - 8.1 g/dL   Albumin 4.0 3.5 - 5.0 g/dL   AST 19 15 - 41 U/L   ALT 28 0 - 44 U/L   Alkaline Phosphatase 88 38 - 126 U/L   Total Bilirubin 1.4 (H) 0.3 - 1.2 mg/dL   GFR calc non Af Amer >60 >60 mL/min   GFR calc Af Amer >60 >60 mL/min   Anion gap 13 5 - 15  CBC WITH DIFFERENTIAL  Result Value Ref Range   WBC 14.7 (H) 4.0 - 10.5 K/uL   RBC 4.87 3.87 - 5.11 MIL/uL   Hemoglobin  13.1 12.0 - 15.0 g/dL   HCT 13.044.1 86.536.0 - 78.446.0 %   MCV 90.6 80.0 - 100.0 fL   MCH 26.9 26.0 - 34.0 pg   MCHC 29.7 (L) 30.0 - 36.0 g/dL   RDW 69.615.9 (H) 29.511.5 - 28.415.5 %   Platelets 229 150 - 400 K/uL   nRBC 0.3 (H) 0.0 - 0.2 %   Neutrophils Relative % 89 %   Neutro Abs 13.2 (H) 1.7 - 7.7 K/uL   Lymphocytes Relative 5 %   Lymphs Abs 0.7 0.7 - 4.0 K/uL   Monocytes Relative 5 %   Monocytes Absolute 0.7 0.1 - 1.0 K/uL   Eosinophils Relative 0 %   Eosinophils Absolute 0.0 0.0 - 0.5 K/uL   Basophils Relative 1 %   Basophils Absolute 0.1 0.0 - 0.1 K/uL   Immature Granulocytes 0 %   Abs Immature Granulocytes 0.05 0.00 - 0.07 K/uL  Brain natriuretic peptide  Result Value Ref Range   B Natriuretic Peptide 116.0 (H) 0.0 - 100.0 pg/mL  HIV Antibody (routine testing w rflx)  Result Value Ref Range   HIV Screen 4th Generation wRfx NON REACTIVE NON REACTIVE  Legionella Pneumophila Serogp 1 Ur Ag  Result Value Ref Range   L. pneumophila Serogp 1 Ur Ag Negative Negative   Source of Sample URINE, RANDOM   Strep pneumoniae urinary antigen  Result Value Ref Range   Strep Pneumo Urinary Antigen NEGATIVE NEGATIVE  Procalcitonin  Result Value Ref Range   Procalcitonin <0.10 ng/mL  Procalcitonin - Baseline  Result Value Ref Range   Procalcitonin <0.10 ng/mL  Basic metabolic panel  Result Value  Ref Range   Sodium 140 135 - 145 mmol/L   Potassium 5.1 3.5 - 5.1 mmol/L   Chloride 99 98 - 111 mmol/L   CO2 32 22 - 32 mmol/L   Glucose, Bld 153 (H) 70 - 99 mg/dL   BUN 20 6 - 20 mg/dL   Creatinine, Ser 3.08 0.44 - 1.00 mg/dL   Calcium 8.5 (L) 8.9 - 10.3 mg/dL   GFR calc non Af Amer >60 >60 mL/min   GFR calc Af Amer >60 >60 mL/min   Anion gap 9 5 - 15  Blood gas, arterial  Result Value Ref Range   FIO2 0.44    Delivery systems NASAL CANNULA    pH, Arterial 7.35 7.350 - 7.450   pCO2 arterial 66 (HH) 32.0 - 48.0 mmHg   pO2, Arterial 51 (L) 83.0 - 108.0 mmHg   Bicarbonate 36.4 (H) 20.0 - 28.0 mmol/L    Acid-Base Excess 8.3 (H) 0.0 - 2.0 mmol/L   O2 Saturation 83.8 %   Patient temperature 37.0    Collection site RIGHT RADIAL    Sample type ARTERIAL DRAW    Allens test (pass/fail) PASS PASS  Procalcitonin  Result Value Ref Range   Procalcitonin <0.10 ng/mL  Basic metabolic panel  Result Value Ref Range   Sodium 142 135 - 145 mmol/L   Potassium 4.8 3.5 - 5.1 mmol/L   Chloride 97 (L) 98 - 111 mmol/L   CO2 34 (H) 22 - 32 mmol/L   Glucose, Bld 166 (H) 70 - 99 mg/dL   BUN 37 (H) 6 - 20 mg/dL   Creatinine, Ser 6.57 (H) 0.44 - 1.00 mg/dL   Calcium 8.5 (L) 8.9 - 10.3 mg/dL   GFR calc non Af Amer 58 (L) >60 mL/min   GFR calc Af Amer >60 >60 mL/min   Anion gap 11 5 - 15  CBC  Result Value Ref Range   WBC 8.0 4.0 - 10.5 K/uL   RBC 4.39 3.87 - 5.11 MIL/uL   Hemoglobin 11.6 (L) 12.0 - 15.0 g/dL   HCT 84.6 96.2 - 95.2 %   MCV 91.1 80.0 - 100.0 fL   MCH 26.4 26.0 - 34.0 pg   MCHC 29.0 (L) 30.0 - 36.0 g/dL   RDW 84.1 32.4 - 40.1 %   Platelets 246 150 - 400 K/uL   nRBC 0.4 (H) 0.0 - 0.2 %  Basic metabolic panel  Result Value Ref Range   Sodium 141 135 - 145 mmol/L   Potassium 4.8 3.5 - 5.1 mmol/L   Chloride 96 (L) 98 - 111 mmol/L   CO2 34 (H) 22 - 32 mmol/L   Glucose, Bld 175 (H) 70 - 99 mg/dL   BUN 33 (H) 6 - 20 mg/dL   Creatinine, Ser 0.27 0.44 - 1.00 mg/dL   Calcium 8.7 (L) 8.9 - 10.3 mg/dL   GFR calc non Af Amer >60 >60 mL/min   GFR calc Af Amer >60 >60 mL/min   Anion gap 11 5 - 15      Assessment & Plan:   Problem List Items Addressed This Visit    Bilateral pneumonia   Relevant Medications   ANORO ELLIPTA 62.5-25 MCG/INH AEPB    Other Visit Diagnoses    Acute respiratory failure with hypoxia (HCC)    -  Primary   Obesity hypoventilation syndrome (HCC)       Relevant Medications   ANORO ELLIPTA 62.5-25 MCG/INH AEPB   predniSONE (DELTASONE) 20 MG tablet  Resolved hypoxic respiratory failure at rest, now able to maintain O2 at rest Some O2 desaturation with  exertion, has home O2 currently 2L for exertion Completed antibiotic course, PNA has resolved, no sign of recurrence Re order burst of 5 days steroid again  daily x 5 days start if needed within 24-48 hours to improve breathing Offer sample anoro inhaler, for dailiy use, can order new rx if requested within 1 week Recommend refer to Pulmology - await her response and recovery may consider this in near future if not improving, given cardiac cause less likely cause of her symptoms.  Return criteria reviewed.  Meds ordered this encounter  Medications  . ANORO ELLIPTA 62.5-25 MCG/INH AEPB    Sig: Inhale 1 puff into the lungs daily.    Dispense:  14 each    Refill:  0  . predniSONE (DELTASONE) 20 MG tablet    Sig: Take 2 tablets (40 mg total) by mouth daily with breakfast. For up to 5 days    Dispense:  10 tablet    Refill:  0    Follow up plan: Return in about 2 weeks (around 02/28/2019) for dyspnea.   Saralyn Pilar, DO [facility] 02/14/2019, 4:41 PM

## 2019-02-14 NOTE — Patient Instructions (Addendum)
Thank you for coming to the office today.  May be COPD or possibly Hypoventilation syndrome  We may need refer to Pulm or lung doctor soon to do more testing if not improving.  Start with Anoro inhaler to see if there is a COPD component  Keep on oxygen as you are for now, may need it for up to 2-4 weeks for now. Lung doctors often can help [place] determine if you need longer term.  Prednisone burst again as needed if any difficulty with breathing or just need a little longer course.  Seek care immediately again at hospital if acute worsening  Please schedule a Follow-up Appointment to: Return in about 2 weeks (around 02/28/2019) for dyspnea.  If you have any other questions or concerns, please feel free to call the office or send a message through [place]. You may also schedule an earlier appointment if necessary.  Additionally, you may be receiving a survey about your experience at our office within a few days to 1 week by e-mail or mail. We value your feedback.  Nobie Putnam, DO [place]

## 2019-02-26 ENCOUNTER — Other Ambulatory Visit: Payer: Self-pay

## 2019-02-26 ENCOUNTER — Encounter: Payer: Self-pay | Admitting: Family Medicine

## 2019-02-26 ENCOUNTER — Ambulatory Visit (INDEPENDENT_AMBULATORY_CARE_PROVIDER_SITE_OTHER): Payer: BC Managed Care – PPO | Admitting: Family Medicine

## 2019-02-26 VITALS — BP 139/77 | HR 82 | Temp 98.3°F | Resp 18 | Ht 62.0 in | Wt 353.5 lb

## 2019-02-26 DIAGNOSIS — J189 Pneumonia, unspecified organism: Secondary | ICD-10-CM | POA: Diagnosis not present

## 2019-02-26 DIAGNOSIS — J9601 Acute respiratory failure with hypoxia: Secondary | ICD-10-CM | POA: Diagnosis not present

## 2019-02-26 DIAGNOSIS — I480 Paroxysmal atrial fibrillation: Secondary | ICD-10-CM | POA: Diagnosis not present

## 2019-02-26 DIAGNOSIS — R0609 Other forms of dyspnea: Secondary | ICD-10-CM

## 2019-02-26 DIAGNOSIS — R0600 Dyspnea, unspecified: Secondary | ICD-10-CM

## 2019-02-26 NOTE — Patient Instructions (Addendum)
Thank you for coming to the office today.  Send [place] disability return to work forms, will complete, anticipated date 03/10/19  If you need new inhaler or pulm referral let us know.  If you still need oxygen needs to be tested, overnight or daytime.  Please schedule a Follow-up Appointment to: Return if symptoms worsen or fail to improve.  If you have any other questions or concerns, please feel free to call the office or send a message through [place]. You may also schedule an earlier appointment if necessary.  Additionally, you may be receiving a survey about your experience at our office within a few days to 1 week by e-mail or mail. We value your feedback.  Nobie Putnam, DO [place]

## 2019-02-26 NOTE — Progress Notes (Signed)
Subjective:    [name], female    DOB: 12-23-1960, 58 y.o.   MRN: [id number]  Katherine DenverKay H Bettendorf is a 58 y.o. female presenting on 02/26/2019 for Shortness of Breath (pt recently diagnose w/ pneumonia and respiratory failure )   HPI   Follow-up Dyspnea / Recent pneumonia / Respiratory Failure - Last visit with me 02/14/19, for same problem hospital follow-up, treated with extended prednisone burst for 5 days, continued oxygen, completed antibiotic course from hospital, see prior notes for background information. - Interval update with trial on sample Anoro daily for maintenance, but was only partially effective, she has stopped using it, no longer needed. - Today patient reports overall improvement >80-90% improved. She is breathing better, less dyspnea, able to walk longer distances without getting as winded Overnight 1.5 to 2L overnight, uses CPAP, has 1 month supply, doesn't need new order at this time, hopeful she can wean off of it, feels better in morning. - She does not think she needs Pulmonologist at this time. - Has albuterol PRN, infrequently using  Currently out of work on short term disability - works as Haematologistbank teller. She has been out of work since 02/03/19 with hospitalization, and she is hopeful to return within 1-2 weeks  Paroxysmal Atrial Fibrillation History AFib, since 2013, previous cardioversion usually put into rhythm. Now seems Paroxysmal, on Amiodarone with hopes for chemical cardioversion Followed by [facility]  Denies any significant worsening dyspnea, productive cough, fever chills, chest pain or tightness, worse swelling.  [facility] 02/14/2019 08/13/2018 11/09/2016  Decreased Interest 0 0 0  Down, Depressed, Hopeless 0 0 0  PHQ - 2 Score 0 0 0  Altered sleeping - - 1  Tired, decreased energy - - 1  Change in appetite - - 1  Feeling bad or failure about yourself  - - 0  Trouble concentrating - - 0  Moving slowly or  fidgety/restless - - 0  Suicidal thoughts - - 0  PHQ-9 Score - - 3    Social History   Tobacco Use  . Smoking status: Never Smoker  . Smokeless tobacco: Never Used  Substance Use Topics  . Alcohol use: No  . Drug use: No    Review of Systems Per HPI unless specifically indicated above     Objective:    BP 139/77 (BP Location: Right Arm, Patient Position: Sitting, Cuff Size: Large)   Pulse 82   Temp 98.3 F (36.8 C) (Oral)   Resp 18   Ht 5\' 2"  (1.575 m)   Wt (!) 353 lb 8 oz (160.3 kg)   SpO2 97%   BMI 64.66 kg/m   Wt Readings from Last 3 Encounters:  02/26/19 (!) 353 lb 8 oz (160.3 kg)  02/14/19 (!) 353 lb (160.1 kg)  02/03/19 (!) 363 lb 12.1 oz (165 kg)    Physical Exam Vitals signs and nursing note reviewed.  Constitutional:      General: She is not in acute distress.    Appearance: She is well-developed. She is not diaphoretic.     Comments: Well-appearing, comfortable, cooperative  HENT:     Head: Normocephalic and atraumatic.  Eyes:     General:        Right eye: No discharge.        Left eye: No discharge.     Conjunctiva/sclera: Conjunctivae normal.  Neck:     Musculoskeletal: Normal range of motion and neck supple.     Thyroid:  No thyromegaly.  Cardiovascular:     Rate and Rhythm: Normal rate and regular rhythm.     Heart sounds: Normal heart sounds. No murmur.  Pulmonary:     Effort: Pulmonary effort is normal. No respiratory distress.     Breath sounds: Normal breath sounds. No wheezing or rales.     Comments: Improved air movement Musculoskeletal: Normal range of motion.     Right lower leg: Edema present.     Left lower leg: No edema.  Lymphadenopathy:     Cervical: No cervical adenopathy.  Skin:    General: Skin is warm and dry.     Findings: No erythema or rash.  Neurological:     Mental Status: She is alert and oriented to person, place, and time.  Psychiatric:        Behavior: Behavior normal.     Comments: Well groomed, good eye  contact, normal speech and thoughts    Results for orders placed or performed during the hospital encounter of 02/03/19  Blood Culture (routine x 2)   Specimen: BLOOD  Result Value Ref Range   Specimen Description BLOOD RIGHT ANTECUBITAL    Special Requests      BOTTLES DRAWN AEROBIC AND ANAEROBIC Blood Culture adequate volume   Culture      NO GROWTH 5 DAYS Performed at [facility], [address] [place], [place] [address]    Report Status 02/08/2019 FINAL   Blood Culture (routine x 2)   Specimen: BLOOD  Result Value Ref Range   Specimen Description BLOOD LEFT ANTECUBITAL    Special Requests      BOTTLES DRAWN AEROBIC AND ANAEROBIC Blood Culture results may not be optimal due to an excessive volume of blood received in culture bottles   Culture      NO GROWTH 5 DAYS Performed at [facility], [address], [place], [place] [address]    Report Status 02/08/2019 FINAL   Urine culture   Specimen: In/Out Cath Urine  Result Value Ref Range   Specimen Description      IN/OUT CATH URINE Performed at [facility], [address] [place], [place] [address]    Special Requests      NONE Performed at [facility], [address] [place], [place] [address]    Culture      NO GROWTH Performed at [facility], [address], [place], [place] [address]    Report Status 02/05/2019 FINAL   SARS Coronavirus 2 by RT PCR (hospital order, performed in [facility] hospital lab) Nasopharyngeal Nasopharyngeal Swab   Specimen: Nasopharyngeal Swab  Result Value Ref Range   SARS Coronavirus 2 NEGATIVE NEGATIVE  MRSA PCR Screening   Specimen: Nasal Mucosa; Nasopharyngeal  Result Value Ref Range   MRSA by PCR NEGATIVE NEGATIVE  Lactic acid, plasma  Result Value Ref Range   Lactic Acid, Venous 1.2 0.5 - 1.9 mmol/L  Comprehensive metabolic panel  Result Value Ref Range   Sodium 140 135 - 145 mmol/L   Potassium 3.9 3.5 - 5.1 mmol/L    Chloride 96 (L) 98 - 111 mmol/L   CO2 31 22 - 32 mmol/L   Glucose, Bld 131 (H) 70 - 99 mg/dL   BUN 20 6 - 20 mg/dL   Creatinine, Ser 6.96 0.44 - 1.00 mg/dL   Calcium 8.7 (L) 8.9 - 10.3 mg/dL   Total Protein 8.0 6.5 - 8.1 g/dL   Albumin 4.0 3.5 - 5.0 g/dL  AST 19 15 - 41 U/L   ALT 28 0 - 44 U/L   Alkaline Phosphatase 88 38 - 126 U/L   Total Bilirubin 1.4 (H) 0.3 - 1.2 mg/dL   GFR calc non Af Amer >60 >60 mL/min   GFR calc Af Amer >60 >60 mL/min   Anion gap 13 5 - 15  CBC WITH DIFFERENTIAL  Result Value Ref Range   WBC 14.7 (H) 4.0 - 10.5 K/uL   RBC 4.87 3.87 - 5.11 MIL/uL   Hemoglobin 13.1 12.0 - 15.0 g/dL   HCT 44.1 36.0 - 46.0 %   MCV 90.6 80.0 - 100.0 fL   MCH 26.9 26.0 - 34.0 pg   MCHC 29.7 (L) 30.0 - 36.0 g/dL   RDW 15.9 (H) 11.5 - 15.5 %   Platelets 229 150 - 400 K/uL   nRBC 0.3 (H) 0.0 - 0.2 %   Neutrophils Relative % 89 %   Neutro Abs 13.2 (H) 1.7 - 7.7 K/uL   Lymphocytes Relative 5 %   Lymphs Abs 0.7 0.7 - 4.0 K/uL   Monocytes Relative 5 %   Monocytes Absolute 0.7 0.1 - 1.0 K/uL   Eosinophils Relative 0 %   Eosinophils Absolute 0.0 0.0 - 0.5 K/uL   Basophils Relative 1 %   Basophils Absolute 0.1 0.0 - 0.1 K/uL   Immature Granulocytes 0 %   Abs Immature Granulocytes 0.05 0.00 - 0.07 K/uL  Brain natriuretic peptide  Result Value Ref Range   B Natriuretic Peptide 116.0 (H) 0.0 - 100.0 pg/mL  HIV Antibody (routine testing w rflx)  Result Value Ref Range   HIV Screen 4th Generation wRfx NON REACTIVE NON REACTIVE  Legionella Pneumophila Serogp 1 Ur Ag  Result Value Ref Range   L. pneumophila Serogp 1 Ur Ag Negative Negative   Source of Sample URINE, RANDOM   Strep pneumoniae urinary antigen  Result Value Ref Range   Strep Pneumo Urinary Antigen NEGATIVE NEGATIVE  Procalcitonin  Result Value Ref Range   Procalcitonin <0.10 ng/mL  Procalcitonin - Baseline  Result Value Ref Range   Procalcitonin <0.10 ng/mL  Basic metabolic panel  Result Value Ref Range    Sodium 140 135 - 145 mmol/L   Potassium 5.1 3.5 - 5.1 mmol/L   Chloride 99 98 - 111 mmol/L   CO2 32 22 - 32 mmol/L   Glucose, Bld 153 (H) 70 - 99 mg/dL   BUN 20 6 - 20 mg/dL   Creatinine, Ser 0.87 0.44 - 1.00 mg/dL   Calcium 8.5 (L) 8.9 - 10.3 mg/dL   GFR calc non Af Amer >60 >60 mL/min   GFR calc Af Amer >60 >60 mL/min   Anion gap 9 5 - 15  Blood gas, arterial  Result Value Ref Range   FIO2 0.44    Delivery systems NASAL CANNULA    pH, Arterial 7.35 7.350 - 7.450   pCO2 arterial 66 (HH) 32.0 - 48.0 mmHg   pO2, Arterial 51 (L) 83.0 - 108.0 mmHg   Bicarbonate 36.4 (H) 20.0 - 28.0 mmol/L   Acid-Base Excess 8.3 (H) 0.0 - 2.0 mmol/L   O2 Saturation 83.8 %   Patient temperature 37.0    Collection site RIGHT RADIAL    Sample type ARTERIAL DRAW    Allens test (pass/fail) PASS PASS  Procalcitonin  Result Value Ref Range   Procalcitonin <0.10 ng/mL  Basic metabolic panel  Result Value Ref Range   Sodium 142 135 - 145 mmol/L  Potassium 4.8 3.5 - 5.1 mmol/L   Chloride 97 (L) 98 - 111 mmol/L   CO2 34 (H) 22 - 32 mmol/L   Glucose, Bld 166 (H) 70 - 99 mg/dL   BUN 37 (H) 6 - 20 mg/dL   Creatinine, Ser 0.93 (H) 0.44 - 1.00 mg/dL   Calcium 8.5 (L) 8.9 - 10.3 mg/dL   GFR calc non Af Amer 58 (L) >60 mL/min   GFR calc Af Amer >60 >60 mL/min   Anion gap 11 5 - 15  CBC  Result Value Ref Range   WBC 8.0 4.0 - 10.5 K/uL   RBC 4.39 3.87 - 5.11 MIL/uL   Hemoglobin 11.6 (L) 12.0 - 15.0 g/dL   HCT 23.5 57.3 - 22.0 %   MCV 91.1 80.0 - 100.0 fL   MCH 26.4 26.0 - 34.0 pg   MCHC 29.0 (L) 30.0 - 36.0 g/dL   RDW 25.4 27.0 - 62.3 %   Platelets 246 150 - 400 K/uL   nRBC 0.4 (H) 0.0 - 0.2 %  Basic metabolic panel  Result Value Ref Range   Sodium 141 135 - 145 mmol/L   Potassium 4.8 3.5 - 5.1 mmol/L   Chloride 96 (L) 98 - 111 mmol/L   CO2 34 (H) 22 - 32 mmol/L   Glucose, Bld 175 (H) 70 - 99 mg/dL   BUN 33 (H) 6 - 20 mg/dL   Creatinine, Ser 7.62 0.44 - 1.00 mg/dL   Calcium 8.7 (L) 8.9 - 10.3  mg/dL   GFR calc non Af Amer >60 >60 mL/min   GFR calc Af Amer >60 >60 mL/min   Anion gap 11 5 - 15      Assessment & Plan:   Problem List Items Addressed This Visit    Paroxysmal atrial fibrillation (HCC)   Bilateral pneumonia - Primary    Other Visit Diagnoses    Acute respiratory failure with hypoxia (HCC)       Dyspnea on exertion          Resolving Respiratory Failure secondary to S/p Bilateral Pneumonia Improved Dyspnea, secondary to above dx  Significant improvement overall after recent hospitalization Completed antibiotics Completed recent additional burst prednistone Now only on supplemental O2 only  Off maintenance therapy inhaler trial Recommend refer to Pulmology - declines today will reconsider in future if need  Recommend that she may return to work Mon 11/30 - full duty no restrictions Will complete short term disability paperwork when received for her missed time.   No orders of the defined types were placed in this encounter.    Follow up plan: Return if symptoms worsen or fail to improve.   Saralyn Pilar, DO [facility] 02/26/2019, 8:18 AM

## 2019-03-03 ENCOUNTER — Encounter: Payer: Self-pay | Admitting: Family Medicine

## 2019-03-10 DIAGNOSIS — J189 Pneumonia, unspecified organism: Secondary | ICD-10-CM | POA: Diagnosis not present

## 2019-03-20 ENCOUNTER — Ambulatory Visit (INDEPENDENT_AMBULATORY_CARE_PROVIDER_SITE_OTHER): Payer: BC Managed Care – PPO

## 2019-03-20 ENCOUNTER — Inpatient Hospital Stay
Admission: EM | Admit: 2019-03-20 | Discharge: 2019-03-21 | DRG: 193 | Discharge status: Against Medical Advice | Payer: BC Managed Care – PPO | Attending: Internal Medicine | Admitting: Internal Medicine

## 2019-03-20 ENCOUNTER — Ambulatory Visit (INDEPENDENT_AMBULATORY_CARE_PROVIDER_SITE_OTHER)
Admission: EM | Admit: 2019-03-20 | Discharge: 2019-03-20 | Disposition: A | Discharge status: Other Acute Inpatient Hospital | Payer: BC Managed Care – PPO | Source: Home / Self Care

## 2019-03-20 ENCOUNTER — Encounter: Payer: Self-pay | Admitting: Emergency Medicine

## 2019-03-20 ENCOUNTER — Other Ambulatory Visit: Payer: Self-pay

## 2019-03-20 DIAGNOSIS — I482 Chronic atrial fibrillation, unspecified: Secondary | ICD-10-CM

## 2019-03-20 DIAGNOSIS — Z7901 Long term (current) use of anticoagulants: Secondary | ICD-10-CM | POA: Diagnosis not present

## 2019-03-20 DIAGNOSIS — R600 Localized edema: Secondary | ICD-10-CM | POA: Diagnosis present

## 2019-03-20 DIAGNOSIS — Z5329 Procedure and treatment not carried out because of patient's decision for other reasons: Secondary | ICD-10-CM | POA: Diagnosis present

## 2019-03-20 DIAGNOSIS — J440 Chronic obstructive pulmonary disease with acute lower respiratory infection: Secondary | ICD-10-CM | POA: Diagnosis not present

## 2019-03-20 DIAGNOSIS — Z7902 Long term (current) use of antithrombotics/antiplatelets: Secondary | ICD-10-CM | POA: Diagnosis not present

## 2019-03-20 DIAGNOSIS — Z90710 Acquired absence of both cervix and uterus: Secondary | ICD-10-CM

## 2019-03-20 DIAGNOSIS — E039 Hypothyroidism, unspecified: Secondary | ICD-10-CM | POA: Diagnosis present

## 2019-03-20 DIAGNOSIS — Z9981 Dependence on supplemental oxygen: Secondary | ICD-10-CM | POA: Diagnosis not present

## 2019-03-20 DIAGNOSIS — R0602 Shortness of breath: Secondary | ICD-10-CM

## 2019-03-20 DIAGNOSIS — Z800 Family history of malignant neoplasm of digestive organs: Secondary | ICD-10-CM | POA: Diagnosis not present

## 2019-03-20 DIAGNOSIS — Z6841 Body Mass Index (BMI) 40.0 and over, adult: Secondary | ICD-10-CM | POA: Diagnosis not present

## 2019-03-20 DIAGNOSIS — I4891 Unspecified atrial fibrillation: Secondary | ICD-10-CM

## 2019-03-20 DIAGNOSIS — J9621 Acute and chronic respiratory failure with hypoxia: Secondary | ICD-10-CM | POA: Diagnosis not present

## 2019-03-20 DIAGNOSIS — R0902 Hypoxemia: Secondary | ICD-10-CM

## 2019-03-20 DIAGNOSIS — Z801 Family history of malignant neoplasm of trachea, bronchus and lung: Secondary | ICD-10-CM

## 2019-03-20 DIAGNOSIS — Z79899 Other long term (current) drug therapy: Secondary | ICD-10-CM | POA: Diagnosis not present

## 2019-03-20 DIAGNOSIS — R5383 Other fatigue: Secondary | ICD-10-CM | POA: Diagnosis not present

## 2019-03-20 DIAGNOSIS — Y95 Nosocomial condition: Secondary | ICD-10-CM | POA: Diagnosis present

## 2019-03-20 DIAGNOSIS — Z8249 Family history of ischemic heart disease and other diseases of the circulatory system: Secondary | ICD-10-CM

## 2019-03-20 DIAGNOSIS — R6883 Chills (without fever): Secondary | ICD-10-CM | POA: Diagnosis not present

## 2019-03-20 DIAGNOSIS — Z20828 Contact with and (suspected) exposure to other viral communicable diseases: Secondary | ICD-10-CM | POA: Diagnosis not present

## 2019-03-20 DIAGNOSIS — I10 Essential (primary) hypertension: Secondary | ICD-10-CM | POA: Diagnosis not present

## 2019-03-20 DIAGNOSIS — I480 Paroxysmal atrial fibrillation: Secondary | ICD-10-CM | POA: Diagnosis present

## 2019-03-20 DIAGNOSIS — J9601 Acute respiratory failure with hypoxia: Secondary | ICD-10-CM | POA: Diagnosis present

## 2019-03-20 DIAGNOSIS — J189 Pneumonia, unspecified organism: Secondary | ICD-10-CM | POA: Diagnosis not present

## 2019-03-20 DIAGNOSIS — Z803 Family history of malignant neoplasm of breast: Secondary | ICD-10-CM | POA: Diagnosis not present

## 2019-03-20 DIAGNOSIS — I872 Venous insufficiency (chronic) (peripheral): Secondary | ICD-10-CM | POA: Diagnosis not present

## 2019-03-20 DIAGNOSIS — G4733 Obstructive sleep apnea (adult) (pediatric): Secondary | ICD-10-CM | POA: Diagnosis present

## 2019-03-20 LAB — COMPREHENSIVE METABOLIC PANEL
ALT: 22 U/L (ref 0–44)
AST: 15 U/L (ref 15–41)
Albumin: 4 g/dL (ref 3.5–5.0)
Alkaline Phosphatase: 66 U/L (ref 38–126)
Anion gap: 8 (ref 5–15)
BUN: 17 mg/dL (ref 6–20)
CO2: 30 mmol/L (ref 22–32)
Calcium: 8.9 mg/dL (ref 8.9–10.3)
Chloride: 103 mmol/L (ref 98–111)
Creatinine, Ser: 0.92 mg/dL (ref 0.44–1.00)
GFR calc Af Amer: >60 mL/min (ref >60)
GFR calc non Af Amer: >60 mL/min (ref >60)
Glucose, Bld: 120 mg/dL — ABNORMAL HIGH (ref 70–99)
Potassium: 4.1 mmol/L (ref 3.5–5.1)
Sodium: 141 mmol/L (ref 135–145)
Total Bilirubin: 0.9 mg/dL (ref 0.3–1.2)
Total Protein: 7.3 g/dL (ref 6.5–8.1)

## 2019-03-20 LAB — CBC WITH DIFFERENTIAL/PLATELET
Abs Immature Granulocytes: 0.02 10*3/uL (ref 0.00–0.07)
Abs Immature Granulocytes: 0.03 10*3/uL (ref 0.00–0.07)
Basophils Absolute: 0 10*3/uL (ref 0.0–0.1)
Basophils Absolute: 0.1 10*3/uL (ref 0.0–0.1)
Basophils Relative: 1 %
Basophils Relative: 1 %
Eosinophils Absolute: 0.1 10*3/uL (ref 0.0–0.5)
Eosinophils Absolute: 0.1 10*3/uL (ref 0.0–0.5)
Eosinophils Relative: 2 %
Eosinophils Relative: 1 %
HCT: 39.3 % (ref 36.0–46.0)
HCT: 37.3 % (ref 36.0–46.0)
Hemoglobin: 11.9 g/dL — ABNORMAL LOW (ref 12.0–15.0)
Hemoglobin: 11.4 g/dL — ABNORMAL LOW (ref 12.0–15.0)
Immature Granulocytes: 0 %
Immature Granulocytes: 0 %
Lymphocytes Relative: 10 %
Lymphocytes Relative: 10 %
Lymphs Abs: 0.8 10*3/uL (ref 0.7–4.0)
Lymphs Abs: 0.8 10*3/uL (ref 0.7–4.0)
MCH: 27.4 pg (ref 26.0–34.0)
MCH: 27.1 pg (ref 26.0–34.0)
MCHC: 30.3 g/dL (ref 30.0–36.0)
MCHC: 30.6 g/dL (ref 30.0–36.0)
MCV: 90.6 fL (ref 80.0–100.0)
MCV: 88.8 fL (ref 80.0–100.0)
Monocytes Absolute: 0.5 10*3/uL (ref 0.1–1.0)
Monocytes Absolute: 0.5 10*3/uL (ref 0.1–1.0)
Monocytes Relative: 7 %
Monocytes Relative: 7 %
Neutro Abs: 6.4 10*3/uL (ref 1.7–7.7)
Neutro Abs: 6.7 10*3/uL (ref 1.7–7.7)
Neutrophils Relative %: 80 %
Neutrophils Relative %: 81 %
Platelets: 181 10*3/uL (ref 150–400)
Platelets: 187 10*3/uL (ref 150–400)
RBC: 4.34 MIL/uL (ref 3.87–5.11)
RBC: 4.2 MIL/uL (ref 3.87–5.11)
RDW: 17.2 % — ABNORMAL HIGH (ref 11.5–15.5)
RDW: 17 % — ABNORMAL HIGH (ref 11.5–15.5)
WBC: 7.9 10*3/uL (ref 4.0–10.5)
WBC: 8.2 10*3/uL (ref 4.0–10.5)
nRBC: 0 % (ref 0.0–0.2)
nRBC: 0 % (ref 0.0–0.2)

## 2019-03-20 LAB — BASIC METABOLIC PANEL
Anion gap: 8 (ref 5–15)
BUN: 15 mg/dL (ref 6–20)
CO2: 33 mmol/L — ABNORMAL HIGH (ref 22–32)
Calcium: 8.5 mg/dL — ABNORMAL LOW (ref 8.9–10.3)
Chloride: 102 mmol/L (ref 98–111)
Creatinine, Ser: 0.88 mg/dL (ref 0.44–1.00)
GFR calc Af Amer: >60 mL/min (ref >60)
GFR calc non Af Amer: >60 mL/min (ref >60)
Glucose, Bld: 111 mg/dL — ABNORMAL HIGH (ref 70–99)
Potassium: 3.8 mmol/L (ref 3.5–5.1)
Sodium: 143 mmol/L (ref 135–145)

## 2019-03-20 LAB — GLUCOSE, CAPILLARY: Glucose-Capillary: 121 mg/dL — ABNORMAL HIGH (ref 70–99)

## 2019-03-20 LAB — TROPONIN I (HIGH SENSITIVITY): Troponin I (High Sensitivity): 3 ng/L (ref <18)

## 2019-03-20 LAB — PROCALCITONIN: Procalcitonin: <0.1 ng/mL

## 2019-03-20 LAB — BRAIN NATRIURETIC PEPTIDE: B Natriuretic Peptide: 106 pg/mL — ABNORMAL HIGH (ref 0.0–100.0)

## 2019-03-20 LAB — POC SARS CORONAVIRUS 2 AG: SARS Coronavirus 2 Ag: NEGATIVE

## 2019-03-20 MED ORDER — SODIUM CHLORIDE 0.9 % IV SOLN
500.0000 mg | Freq: Once | INTRAVENOUS | Status: AC
  Administered 2019-03-20: 500 mg via INTRAVENOUS
  Filled 2019-03-20: qty 500

## 2019-03-20 MED ORDER — PIPERACILLIN-TAZOBACTAM 3.375 G IVPB
3.3750 g | Freq: Three times a day (TID) | INTRAVENOUS | Status: DC
  Administered 2019-03-20 – 2019-03-21 (×2): 3.375 g via INTRAVENOUS
  Filled 2019-03-20 (×5): qty 50

## 2019-03-20 MED ORDER — PIPERACILLIN-TAZOBACTAM 3.375 G IVPB 30 MIN
3.3750 g | Freq: Once | INTRAVENOUS | Status: AC
  Administered 2019-03-20: 3.375 g via INTRAVENOUS
  Filled 2019-03-20: qty 50

## 2019-03-20 NOTE — H&P (Addendum)
History and Physical:    Katherine Scott   ZOX:[id number] DOB: 1961-03-13 DOA: 03/20/2019  Referring MD/provider: Dr. Chesley Noon PCP: Smitty Cords, DO   Patient coming from: Home  Chief Complaint: Shortness of breath  History of Present Illness:   Katherine Scott is an 58 y.o. female with medical history significant for atrial fibrillation on Eliquis, COPD, morbid obesity, OSA on CPAP at night, chronic hypoxemic respiratory failure on 2 L of oxygen, recent hospitalization for bilateral pneumonia in October 2020, who presented to the hospital at the behest of her primary care physician.  He says she has been feeling short of breath for about 4 days now and this has progressively worsened.  Yesterday she had some chills but no fever.  She went to the doctor's office today because of progressive shortness of breath.  A chest x-ray was done and she was later called to come to the emergency room because a chest x-ray revealed right-sided pneumonia.  Patient denies any cough, chest pain, sputum production, wheezing, hemoptysis, difficulty swallowing, vomiting, abdominal pain or diarrhea.  ED Course:  The patient was hypoxemic with oxygen saturation of 79%. Chest x-ray showed extensive infiltrate in the right middle and lower zone consistent with recurrent pneumonia per radiologist report.  ROS:   ROS all other systems reviewed were negative  Past Medical History:   Past Medical History:  Diagnosis Date  . Abnormal mammogram, unspecified 2013  . Atrial fibrillation (HCC)    2013  . Breast screening, unspecified   . COPD (chronic obstructive pulmonary disease) (HCC)   . History of continuous positive airway pressure (CPAP) therapy at home   . Obesity, unspecified   . Screening for obesity   . Special screening for malignant neoplasms, colon   . Unspecified essential hypertension     Past Surgical History:   Past Surgical History:  Procedure Laterality Date  . ABDOMINAL  HYSTERECTOMY  2011  . ANTERIOR CRUCIATE LIGAMENT REPAIR  2003  . BREAST BIOPSY Left January 22, 2012   left breast stereotactic biopsy showed fibroadenomatous changes with microcalcifications, fat necrosis, and sclerosing adenosis and columnar cell changes. No evidence of atypia or malignancy.  Marland Kitchen CARDIOVERSION N/A 09/22/2016   Procedure: CARDIOVERSION;  Surgeon: Dalia Heading, MD;  Location: [facility] ORS;  Service: Cardiovascular;  Laterality: N/A;  . CARDIOVERSION N/A 01/17/2018   Procedure: CARDIOVERSION;  Surgeon: Dalia Heading, MD;  Location: [facility] ORS;  Service: Cardiovascular;  Laterality: N/A;  . CARDIOVERSION N/A 01/09/2019   Procedure: CARDIOVERSION;  Surgeon: Dalia Heading, MD;  Location: [facility] ORS;  Service: Cardiovascular;  Laterality: N/A;  . CESAREAN SECTION  1985, 1987, and 1998  . COLONOSCOPY  2011   Dr. Mechele Collin  . ELECTROPHYSIOLOGIC STUDY N/A 12/03/2014   Procedure: Cardioversion;  Surgeon: Dalia Heading, MD;  Location: [facility] ORS;  Service: Cardiovascular;  Laterality: N/A;  . ELECTROPHYSIOLOGIC STUDY N/A 04/01/2015   Procedure: Cardioversion;  Surgeon: Dalia Heading, MD;  Location: [facility] ORS;  Service: Cardiovascular;  Laterality: N/A;  . KNEE SURGERY  2013    Social History:   Social History   Socioeconomic History  . Marital status: Divorced    Spouse name: Not on file  . Number of children: Not on file  . Years of education: Not on file  . Highest education level: Not on file  Occupational History  . Not on file  Tobacco Use  . Smoking status: Never Smoker  . Smokeless tobacco: Never Used  Substance and Sexual Activity  . Alcohol use: No  . Drug use: No  . Sexual activity: Not on file  Other Topics Concern  . Not on file  Social History Narrative  . Not on file   Social Determinants of Health   Financial Resource Strain:   . Difficulty of Paying Living Expenses: Not on file  Food Insecurity:   . Worried About Charity fundraiser in the Last Year: Not  on file  . Ran Out of Food in the Last Year: Not on file  Transportation Needs:   . Lack of Transportation (Medical): Not on file  . Lack of Transportation (Non-Medical): Not on file  Physical Activity:   . Days of Exercise per Week: Not on file  . Minutes of Exercise per Session: Not on file  Stress:   . Feeling of Stress : Not on file  Social Connections:   . Frequency of Communication with Friends and Family: Not on file  . Frequency of Social Gatherings with Friends and Family: Not on file  . Attends Religious Services: Not on file  . Active Member of Clubs or Organizations: Not on file  . Attends Archivist Meetings: Not on file  . Marital Status: Not on file  Intimate Partner Violence:   . Fear of Current or Ex-Partner: Not on file  . Emotionally Abused: Not on file  . Physically Abused: Not on file  . Sexually Abused: Not on file    Allergies   Patient has no known allergies.  Family history:   Family History  Problem Relation Age of Onset  . Colon cancer Mother        diagnosis age 64's  . Colon cancer Sister        diagnosis age 78  . Atrial fibrillation Sister   . Breast cancer Other        Maternal Grandmother; diagnosis age 77's  . Lung cancer Other        Paternal Grandmother; diagnosis age 53's  . Colon cancer Other        Paternal Grandfather; diagnosis age 38's  . Heart disease Father     Current Medications:   Prior to Admission medications   Medication Sig Start Date End Date Taking? Authorizing Provider  albuterol (VENTOLIN HFA) 108 (90 Base) MCG/ACT inhaler Inhale 2 puffs into the lungs every 6 (six) hours as needed for wheezing. 01/09/18  Yes [provider]  amiodarone (PACERONE) 200 MG tablet Take 200-400 mg by mouth 2 (two) times daily. 01/14/19  Yes [provider]  apixaban (ELIQUIS) 5 MG TABS tablet Take 5 mg by mouth 2 (two) times daily.  10/31/17  Yes [provider]  cyanocobalamin 2000 MCG tablet  Take 2,000 mcg by mouth daily.   Yes [provider]  diltiazem (CARDIZEM CD) 240 MG 24 hr capsule Take 240 mg by mouth daily after breakfast.   Yes [provider]  furosemide (LASIX) 40 MG tablet Take 40 mg by mouth daily. 11/01/17  Yes [provider]  levothyroxine (SYNTHROID) 75 MCG tablet Take 1 tablet (75 mcg total) by mouth daily before breakfast. 09/06/18  Yes Karamalegos, Devonne Doughty, DO  potassium chloride SA (K-DUR,KLOR-CON) 20 MEQ tablet Take 1 tablet (20 mEq total) by mouth daily. Patient taking differently: Take 20 mEq by mouth at bedtime.  02/19/15  Yes Teodoro Spray, MD  OXYGEN Inhale 2 L/L into the lungs.    [provider]  Spacer/Aero-Holding Josiah Lobo  DEVI 1 Device by Does not apply route as needed. 05/24/18   Galen ManilaKennedy, Lauren Renee, NP    Physical Exam:   Vitals:   03/20/19 1459 03/20/19 1500 03/20/19 1700 03/20/19 1830  BP:  (!) 156/78 121/61 134/78  Pulse:  91 84 (!) 50  Resp:  18 15 15   Temp:      SpO2:  100% 100% 98%  Weight: (!) 160.1 kg     Height: 5\' 3"  (1.6 m)        Physical Exam: Blood pressure 134/78, pulse (!) 50, temperature 98.6 F (37 C), resp. rate 15, height 5\' 3"  (1.6 m), weight (!) 160.1 kg, SpO2 98 %. Gen: No acute distress.  Morbidly obese (BMI 62.5) Head: Normocephalic, atraumatic. Eyes: Pupils equal, round and reactive to light. Extraocular movements intact.  Sclerae nonicteric. No lid lag. Mouth: No lesions seen.  Moist mucous membranes  neck: Supple, no thyromegaly, no lymphadenopathy, Chest: Adequate air entry bilaterally, no wheezing heard but there are rales in the right basilar lung.  CV: Heart sounds are irregular  No murmurs, rubs,or gallops heard. Abdomen: Soft, nontender, obese with normal active bowel sounds. Extremities: Extremities are without clubbing, or cyanosis.  Bilateral leg edema with chronic erythema of anterior aspect of bilateral legs (according to patient this is chronic) skin:  Warm and dry. No rashes, lesions or wounds. Neuro: Alert and oriented times 3; grossly nonfocal. Psych: Insight is good and judgment is appropriate. Mood and affect normal.   Data Review:    Labs: Basic Metabolic Panel: Recent Labs  Lab 03/20/19 1358 03/20/19 1619  NA 141 143  K 4.1 3.8  CL 103 102  CO2 30 33*  GLUCOSE 120* 111*  BUN 17 15  CREATININE 0.92 0.88  CALCIUM 8.9 8.5*   Liver Function Tests: Recent Labs  Lab 03/20/19 1358  AST 15  ALT 22  ALKPHOS 66  BILITOT 0.9  PROT 7.3  ALBUMIN 4.0   No results for input(s): LIPASE, AMYLASE in the last 168 hours. No results for input(s): AMMONIA in the last 168 hours. CBC: Recent Labs  Lab 03/20/19 1358 03/20/19 1619  WBC 7.9 8.2  NEUTROABS 6.4 6.7  HGB 11.9* 11.4*  HCT 39.3 37.3  MCV 90.6 88.8  PLT 181 187   Cardiac Enzymes: No results for input(s): CKTOTAL, CKMB, CKMBINDEX, TROPONINI in the last 168 hours.  BNP (last 3 results) No results for input(s): PROBNP in the last 8760 hours. CBG: No results for input(s): GLUCAP in the last 168 hours.  Urinalysis    Component Value Date/Time   COLORURINE Yellow 11/13/2011 1415   APPEARANCEUR Cloudy 11/13/2011 1415   LABSPEC 1.023 11/13/2011 1415   PHURINE 5.0 11/13/2011 1415   GLUCOSEU Negative 11/13/2011 1415   HGBUR Negative 11/13/2011 1415   BILIRUBINUR Negative 11/13/2011 1415   KETONESUR Negative 11/13/2011 1415   PROTEINUR Negative 11/13/2011 1415   NITRITE Negative 11/13/2011 1415   LEUKOCYTESUR Negative 11/13/2011 1415      Radiographic Studies: DG Chest 2 View  Result Date: 03/20/2019 CLINICAL DATA:  Progressive shortness of breath which began 2 days ago. EXAM: CHEST - 2 VIEW COMPARISON:  Chest x-ray dated 02/03/2019 FINDINGS: There is an extensive infiltrate in the right mid and lower lung zones. Chronic cardiomegaly. Pulmonary vascularity is normal. No discrete infiltrates on the left. No discrete pleural effusions. No acute bone  abnormality. IMPRESSION: 1. Extensive infiltrate in the right mid and lower lung zones consistent with recurrent pneumonia. 2. Chronic cardiomegaly. Electronically  Signed   By: Francene Boyers M.D.   On: 03/20/2019 13:43    EKG: Independently reviewed.  It showed atrial fibrillation, rate controlled  Assessment/Plan:   Active Problems:   Pneumonia   Acute respiratory failure with hypoxia (HCC)   Body mass index is 62.53 kg/m.  (Morbid obesity)  Right-sided pneumonia: Admit to MedSurg with telemetry.  Treat with empiric IV antibiotics.  Follow-up blood cultures.  Acute on chronic hypoxemic respiratory failure: Continue oxygen via nasal cannula.  Atrial fibrillation: Continue Eliquis, Cardizem and amiodarone  OSA: Continue CPAP at night  COPD: Stable.  Continue bronchodilators.  Chronic bilateral lower extremity edema/chronic venous insufficiency: Patient takes Lasix at home.  Other information:   DVT prophylaxis: Eliquis Code Status: Full code Family Communication: None Disposition Plan: Possible discharge to home in 2 to 3 days Consults called: None  admission status: Inpatient    The medical decision making is of moderate complexity, therefore this is a level 2 visit.  Time spent 48 minutes  Carl Butner [facility]   How to contact the [facility] Attending or Consulting provider 7A - 7P or covering provider during after hours 7P -7A, for this patient?   1. Check the care team in [facility] and look for a) attending/consulting [facility] provider listed and b) the [facility] team listed 2. Log into [email or web address] and use [facility] universal password to access. If you do not have the password, please contact the hospital operator. 3. Locate the [facility] provider you are looking for under [facility] and page to a number that you can be directly reached. 4. If you still have difficulty reaching the provider, please page the [facility] (Director on Call) for the Hospitalists listed on  amion for assistance.  03/20/2019, 6:44 PM

## 2019-03-20 NOTE — Discharge Instructions (Addendum)
EMS to [facility] Anticipate admission.  Honor Loh, MSN, APRN, FNP-C, CEN Advanced Practice Provider [facility] 03/20/2019 1:55 PM

## 2019-03-20 NOTE — ED Triage Notes (Signed)
Patient c/o shortness of breath that started 2 days ago. She states the shortness of breath got worse last night and this morning. Denies any other symptoms.

## 2019-03-20 NOTE — ED Provider Notes (Signed)
[facility] Emergency Department Provider Note   ____________________________________________   First MD Initiated Contact with Patient 03/20/19 1503     (approximate)  I have reviewed the triage vital signs and the nursing notes.   HISTORY  Chief Complaint Shortness of Breath    HPI Katherine Scott is a 58 y.o. female with past medical history of atrial fibrillation on Eliquis, COPD on 2 L nasal cannula who presents to the ED for shortness of breath.  Patient states that she has been feeling increasingly short of breath for about the past 4 days.  She has been using her supplemental oxygen as needed, but states she had not needed it up until a few days ago.  Since then, she has been using the 2 L nasal cannula consistently and even still feels short of breath.  She reports some associated chills, but denies any recent fevers.  She denies any recent chest pain or cough.  She went to an urgent care earlier today, where chest x-ray showed a right-sided pneumonia and she was referred to the ED for further evaluation.        Past Medical History:  Diagnosis Date  . Abnormal mammogram, unspecified 2013  . Atrial fibrillation ([place])    2013  . Breast screening, unspecified   . COPD (chronic obstructive pulmonary disease) ([place])   . History of continuous positive airway pressure (CPAP) therapy at home   . Obesity, unspecified   . Screening for obesity   . Special screening for malignant neoplasms, colon   . Unspecified essential hypertension     Patient Active Problem List   Diagnosis Date Noted  . Pneumonia 03/20/2019  . Acute respiratory failure with hypoxia ([place]) 03/20/2019  . Bilateral pneumonia 02/03/2019  . Lymphedema 02/26/2017  . 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 Acquired hypothyroidism 08/11/2016  . BMI 60.0-69.9, adult ([place]) 08/11/2016  . Paroxysmal atrial fibrillation ([place]) 02/18/2015    Past Surgical History:  Procedure  Laterality Date  . ABDOMINAL HYSTERECTOMY  2011  . ANTERIOR CRUCIATE LIGAMENT REPAIR  2003  . BREAST BIOPSY Left January 22, 2012   left breast stereotactic biopsy showed fibroadenomatous changes with microcalcifications, fat necrosis, and sclerosing adenosis and columnar cell changes. No evidence of atypia or malignancy.  Marland Kitchen CARDIOVERSION N/A 09/22/2016   Procedure: CARDIOVERSION;  Surgeon: Teodoro Spray, MD;  Location: [facility];  Service: Cardiovascular;  Laterality: N/A;  . CARDIOVERSION N/A 01/17/2018   Procedure: CARDIOVERSION;  Surgeon: Teodoro Spray, MD;  Location: [facility];  Service: Cardiovascular;  Laterality: N/A;  . CARDIOVERSION N/A 01/09/2019   Procedure: CARDIOVERSION;  Surgeon: Teodoro Spray, MD;  Location: [facility];  Service: Cardiovascular;  Laterality: N/A;  . [place]  . COLONOSCOPY  2011   Dr. Vira Agar  . ELECTROPHYSIOLOGIC STUDY N/A 12/03/2014   Procedure: Cardioversion;  Surgeon: Teodoro Spray, MD;  Location: [facility];  Service: Cardiovascular;  Laterality: N/A;  . ELECTROPHYSIOLOGIC STUDY N/A 04/01/2015   Procedure: Cardioversion;  Surgeon: Teodoro Spray, MD;  Location: [facility];  Service: Cardiovascular;  Laterality: N/A;  . KNEE SURGERY  2013    Prior to Admission medications   Medication Sig Start Date End Date Taking? Authorizing Provider  albuterol (VENTOLIN HFA) 108 (90 Base) MCG/ACT inhaler Inhale 2 puffs into the lungs every 6 (six) hours as needed for wheezing. 01/09/18  Yes [provider]  amiodarone (PACERONE) 200 MG tablet Take 200-400 mg  by mouth 2 (two) times daily. 01/14/19  Yes [provider]  apixaban (ELIQUIS) 5 MG TABS tablet Take 5 mg by mouth 2 (two) times daily.  10/31/17  Yes [provider]  cyanocobalamin 2000 MCG tablet Take 2,000 mcg by mouth daily.   Yes [provider]  diltiazem (CARDIZEM CD) 240 MG 24 hr capsule Take 240 mg by mouth daily after breakfast.   Yes  [provider]  furosemide (LASIX) 40 MG tablet Take 40 mg by mouth daily. 11/01/17  Yes [provider]  levothyroxine (SYNTHROID) 75 MCG tablet Take 1 tablet (75 mcg total) by mouth daily before breakfast. 09/06/18  Yes Karamalegos, Netta Neat, DO  potassium chloride SA (K-DUR,KLOR-CON) 20 MEQ tablet Take 1 tablet (20 mEq total) by mouth daily. Patient taking differently: Take 20 mEq by mouth at bedtime.  02/19/15  Yes Dalia Heading, MD  OXYGEN Inhale 2 L/L into the lungs.    [provider]  Spacer/Aero-Holding Chambers DEVI 1 Device by Does not apply route as needed. 05/24/18   Galen Manila, NP    Allergies Patient has no known allergies.  Family History  Problem Relation Age of Onset  . Colon cancer Mother        diagnosis age 78's  . Colon cancer Sister        diagnosis age 64  . Atrial fibrillation Sister   . Breast cancer Other        Maternal Grandmother; diagnosis age 76's  . Lung cancer Other        Paternal Grandmother; diagnosis age 41's  . Colon cancer Other        Paternal Grandfather; diagnosis age 84's  . Heart disease Father     Social History Social History   Tobacco Use  . Smoking status: Never Smoker  . Smokeless tobacco: Never Used  Substance Use Topics  . Alcohol use: No  . Drug use: No    Review of Systems  Constitutional: No fever, or chills. Eyes: No visual changes. ENT: No sore throat. Cardiovascular: Denies chest pain. Respiratory: Positive for shortness of breath. Gastrointestinal: No abdominal pain.  No nausea, no vomiting.  No diarrhea.  No constipation. Genitourinary: Negative for dysuria. Musculoskeletal: Negative for back pain. Skin: Negative for rash. Neurological: Negative for headaches, focal weakness or numbness.  ____________________________________________   PHYSICAL EXAM:  VITAL SIGNS: ED Triage Vitals  Enc Vitals Group     BP 03/20/19 1458 (!) 165/85     Pulse Rate 03/20/19 1458  74     Resp 03/20/19 1458 10     Temp 03/20/19 1458 98.6 F (37 C)     Temp src --      SpO2 03/20/19 1455 (!) 78 %     Weight 03/20/19 1459 (!) 353 lb (160.1 kg)     Height 03/20/19 1459 5\' 3"  (1.6 m)     Head Circumference --      Peak Flow --      Pain Score 03/20/19 1459 0     Pain Loc --      Pain Edu? --      Excl. in GC? --     Constitutional: Alert and oriented.  Obese female in no acute distress. Eyes: Conjunctivae are normal. Head: Atraumatic. Nose: No congestion/rhinnorhea. Mouth/Throat: Mucous membranes are moist. Neck: Normal ROM Cardiovascular: Normal rate, regular rhythm. Grossly normal heart sounds. Respiratory: Normal respiratory effort.  No retractions. Lungs CTAB. Gastrointestinal: Soft and nontender. No  distention. Genitourinary: deferred Musculoskeletal: No lower extremity tenderness nor edema. Neurologic:  Normal speech and language. No gross focal neurologic deficits are appreciated. Skin:  Skin is warm, dry and intact. No rash noted. Psychiatric: Mood and affect are normal. Speech and behavior are normal.  ____________________________________________   LABS (all labs ordered are listed, but only abnormal results are displayed)  Labs Reviewed  BASIC METABOLIC PANEL - Abnormal; Notable for the following components:      Result Value   CO2 33 (*)    Glucose, Bld 111 (*)    Calcium 8.5 (*)    All other components within normal limits  BRAIN NATRIURETIC PEPTIDE - Abnormal; Notable for the following components:   B Natriuretic Peptide 106.0 (*)    All other components within normal limits  CBC WITH DIFFERENTIAL/PLATELET - Abnormal; Notable for the following components:   Hemoglobin 11.4 (*)    RDW 17.0 (*)    All other components within normal limits  GLUCOSE, CAPILLARY - Abnormal; Notable for the following components:   Glucose-Capillary 121 (*)    All other components within normal limits  PROCALCITONIN  POC SARS CORONAVIRUS 2 AG -  ED  POC  SARS CORONAVIRUS 2 AG  TROPONIN I (HIGH SENSITIVITY)  TROPONIN I (HIGH SENSITIVITY)   ____________________________________________  EKG  ED ECG REPORT I, Chesley Noonharles Raynesha Tiedt, the attending physician, personally viewed and interpreted this ECG.   Date: 03/20/2019  EKG Time: 14:55  Rate: 81  Rhythm: atrial fibrillation, rate 81  Axis: Normal  Intervals:none  ST&T Change: None  PROCEDURES  Procedure(s) performed (including Critical Care):  Procedures   ____________________________________________   INITIAL IMPRESSION / ASSESSMENT AND PLAN / ED COURSE       58 year old female with history of COPD on 2 L nasal cannula presents to the ED for worsening shortness of breath over the past 3 to 4 days.  Chest x-ray obtained prior to arrival significant for focal right-sided pneumonia.  Given her recent admission for pneumonia, will cover for healthcare associated pneumonia with Zosyn and azithromycin.  She is in no respiratory distress, but is requiring 4 L of oxygen by nasal cannula above her typical 2 L.  EKG shows no acute ischemic changes and troponin within normal limits.  Case discussed with hospitalist, who accepts patient for admission.      ____________________________________________   FINAL CLINICAL IMPRESSION(S) / ED DIAGNOSES  Final diagnoses:  Healthcare-associated pneumonia  Shortness of breath  Acute respiratory failure with hypoxia [facility](HCC)     ED Discharge Orders    None       Note:  This document was prepared using Dragon voice recognition software and may include unintentional dictation errors.   Chesley NoonJessup, Disaya Walt, MD 03/20/19 [phone]

## 2019-03-20 NOTE — Progress Notes (Signed)
Pharmacy Antibiotic Note  Katherine Scott is a 58 y.o. female admitted on 03/20/2019. Pharmacy has been consulted for Zosyn dosing.  Plan: Zosyn 3.375 g IV q8h extended infusion  Height: 5\' 3"  (160 cm) Weight: (!) 353 lb (160.1 kg) IBW/kg (Calculated) : 52.4  Temp (24hrs), Avg:98.6 F (37 C), Min:98.6 F (37 C), Max:98.6 F (37 C)  Recent Labs  Lab 03/20/19 1358 03/20/19 1619  WBC 7.9 8.2  CREATININE 0.92 0.88    Estimated Creatinine Clearance: 105.1 mL/min (by C-G formula based on SCr of 0.88 mg/dL).    No Known Allergies  Antimicrobials this admission: Zosyn 12/10 >>  Dose adjustments this admission: NA  Microbiology results:   Thank you for allowing pharmacy to be a part of this patient's care.  Tawnya Crook, PharmD 03/20/2019 6:56 PM

## 2019-03-20 NOTE — ED Triage Notes (Signed)
Pt to ED by [facility] from [facility] for SOB related to bilateral pneumonia. Pt received chest XR confirming. Pt hospitalized Oct 2020 for pneumonia. Pt denies any other symptoms.

## 2019-03-20 NOTE — ED Provider Notes (Signed)
[place], [place]   Name: Katherine Scott Brothers DOB: [birth year] MRN: [id number] CSN: [id number] PCP: Smitty CordsKaramalegos, Alexander J, DO  Arrival date and time:  03/20/19 1259  Chief Complaint:  Shortness of Breath   NOTE: Prior to seeing the patient today, I have reviewed the triage nursing documentation and vital signs. Clinical staff has updated patient's PMH/PSHx, current medication list, and drug allergies/intolerances to ensure comprehensive history available to assist in medical decision making.   History:   HPI: Katherine Scott Villalpando is a 58 y.o. female who presents today with complaints of worsening shortness of breath x 2 days. Upon arrival, I was called to the lobby for immediate patient assessment as patient was experiencing significant exertional dyspnea. Patient taken to treatment room from the lobby by clinic staff and VS obtained. Breathing improved when patient in a seated upright position in a bedside chair. Initial SPO2 79% on RA. She was placed on supplemental oxygen at 2L/[place], which improved her oxygen saturations to 94%.   Patient advises that she was admitted to [facility] on 02/03/2019 for BILATERAL community acquired pneumonia, which resulted in acute respiratory failure. She remained hospitalized until 02/08/2019 at which time she was discharged home on supplemental oxygen and continued oral cefdinir and doxycycline courses. Patient returned to work shortly after being discharged from the hospital.   Over the course of the last 2 days, patient reports that she once again developed shortness of breath. She reports that she began to feel worse overall last night and this morning. She started using the supplemental oxygen again during the early morning hours. Patient states, "I am glad I still had it at home. I almost called them to come pick it up already". She denies associated chest pain and significant palpitations. PMH (+) for atrial fibrillations for which she is on daily anticoagulation therapy using  apixaban. Patient reports that she feels like she did when she had pneumonia in October. Patient denies cough, sore throat, facial pain, and ear pain. She has not experienced any fevers, however has had some mild chills. She denies being in close contact with anyone known to be ill; no one that lives with her has a similar symptom constellation. 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 She has not been tested for SARS-CoV-2 in the last 14 days; last tested negative on 02/03/2019. Patient has received her annual influenza vaccination this year (12/10/2018).   Past Medical History:  Diagnosis Date  . Abnormal mammogram, unspecified 2013  . Atrial fibrillation (HCC)    2013  . Breast screening, unspecified   . COPD (chronic obstructive pulmonary disease) (HCC)   . History of continuous positive airway pressure (CPAP) therapy at home   . Obesity, unspecified   . Screening for obesity   . Special screening for malignant neoplasms, colon   . Unspecified essential hypertension     Past Surgical History:  Procedure Laterality Date  . ABDOMINAL HYSTERECTOMY  2011  . ANTERIOR CRUCIATE LIGAMENT REPAIR  2003  . BREAST BIOPSY Left January 22, 2012   left breast stereotactic biopsy showed fibroadenomatous changes with microcalcifications, fat necrosis, and sclerosing adenosis and columnar cell changes. No evidence of atypia or malignancy.  Marland Kitchen. CARDIOVERSION N/A 09/22/2016   Procedure: CARDIOVERSION;  Surgeon: Dalia HeadingFath, Kenneth A, MD;  Location: [facility] ORS;  Service: Cardiovascular;  Laterality: N/A;  . CARDIOVERSION N/A 01/17/2018   Procedure: CARDIOVERSION;  Surgeon: Dalia HeadingFath, Kenneth A, MD;  Location: [facility] ORS;  Service: Cardiovascular;  Laterality: N/A;  . CARDIOVERSION N/A 01/09/2019   Procedure: CARDIOVERSION;  Surgeon: Dalia Heading, MD;  Location: [facility] ORS;  Service: Cardiovascular;  Laterality: N/A;  .  CESAREAN SECTION  1985, 1987, and 1998  . COLONOSCOPY  2011   Dr. Mechele Collin  . ELECTROPHYSIOLOGIC STUDY N/A 12/03/2014   Procedure: Cardioversion;  Surgeon: Dalia Heading, MD;  Location: [facility] ORS;  Service: Cardiovascular;  Laterality: N/A;  . ELECTROPHYSIOLOGIC STUDY N/A 04/01/2015   Procedure: Cardioversion;  Surgeon: Dalia Heading, MD;  Location: [facility] ORS;  Service: Cardiovascular;  Laterality: N/A;  . KNEE SURGERY  2013    Family History  Problem Relation Age of Onset  . Colon cancer Mother        diagnosis age 55's  . Colon cancer Sister        diagnosis age 67  . Atrial fibrillation Sister   . Breast cancer Other        Maternal Grandmother; diagnosis age 85's  . Lung cancer Other        Paternal Grandmother; diagnosis age 65's  . Colon cancer Other        Paternal Grandfather; diagnosis age 32's  . Heart disease Father     Social History   Tobacco Use  . Smoking status: Never Smoker  . Smokeless tobacco: Never Used  Substance Use Topics  . Alcohol use: No  . Drug use: No    Patient Active Problem List   Diagnosis Date Noted  . Pneumonia 03/20/2019  . Acute respiratory failure with hypoxia (HCC) 03/20/2019  . Bilateral pneumonia 02/03/2019  . Lymphedema 02/26/2017  . 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 Acquired hypothyroidism 08/11/2016  . BMI 60.0-69.9, adult (HCC) 08/11/2016  . Paroxysmal atrial fibrillation (HCC) 02/18/2015    Home Medications:    Current Meds  Medication Sig  . albuterol (VENTOLIN HFA) 108 (90 Base) MCG/ACT inhaler Inhale 2 puffs into the lungs every 6 (six) hours as needed for wheezing.  Marland Kitchen amiodarone (PACERONE) 200 MG tablet Take 200-400 mg by mouth 2 (two) times daily.  Marland Kitchen apixaban (ELIQUIS) 5 MG TABS tablet Take 5 mg by mouth 2 (two) times daily.   . cyanocobalamin 2000 MCG tablet Take 2,000 mcg by mouth daily.  Marland Kitchen diltiazem (CARDIZEM CD) 240 MG 24 hr capsule Take 240 mg by mouth daily after breakfast.  . furosemide  (LASIX) 40 MG tablet Take 40 mg by mouth daily.  Marland Kitchen levothyroxine (SYNTHROID) 75 MCG tablet Take 1 tablet (75 mcg total) by mouth daily before breakfast.  . OXYGEN Inhale 2 L/L into the lungs.  . potassium chloride SA (K-DUR,KLOR-CON) 20 MEQ tablet Take 1 tablet (20 mEq total) by mouth daily. (Patient taking differently: Take 20 mEq by mouth at bedtime. )  . [DISCONTINUED] naproxen sodium (ANAPROX) 220 MG tablet Take 440 mg by mouth 2 (two) times daily.     Allergies:   Patient has no known allergies.  Review of Systems (ROS): Review of Systems  Constitutional: Positive for chills and fatigue. Negative for fever.  HENT: Negative for congestion, ear pain, postnasal drip, rhinorrhea, sinus pressure, sinus pain, sneezing and sore throat.   Eyes: Negative for pain, discharge and redness.  Respiratory: Positive for shortness of breath. Negative for cough and chest tightness.        PMH (+) COPD  Cardiovascular: Positive for leg swelling (chronic). Negative for chest pain and palpitations.  Gastrointestinal: Negative for abdominal pain, diarrhea,  nausea and vomiting.  Musculoskeletal: Negative for arthralgias, back pain, myalgias and neck pain.  Skin: Negative for color change, pallor and rash.  Neurological: Negative for dizziness, syncope, weakness and headaches.  Hematological: Negative for adenopathy.     Vital Signs: Today's Vitals   03/20/19 1310 03/20/19 1342 03/20/19 1404 03/20/19 1417  BP:  (!) 143/87 (!) 136/95   Pulse:  99 (!) 106   Resp:  (!) 22 (!) 22   Temp:  98.6 F (37 C)    TempSrc:  Oral    SpO2:  (!) 79% 96%   Weight: (!) 353 lb (160.1 kg)     Height: 5\' 2"  (1.575 m)     PainSc: 0-No pain   0-No pain    Physical Exam: Physical Exam  Constitutional: She is oriented to person, place, and time. She appears distressed (2/2 SOB).  HENT:  Head: Normocephalic and atraumatic.  Nose: Nose normal.  Mouth/Throat: Uvula is midline, oropharynx is clear and moist and  mucous membranes are normal.  Eyes: Pupils are equal, round, and reactive to light.  Cardiovascular: Normal rate, normal heart sounds and intact distal pulses. An irregularly irregular rhythm present.  Pulmonary/Chest: Tachypnea noted. She is in respiratory distress. She has decreased breath sounds in the right middle field and the right lower field. She has rales in the right middle field and the right lower field.  Abdominal: Soft. Normal appearance and bowel sounds are normal. She exhibits no distension. There is no abdominal tenderness.  Musculoskeletal:     Cervical back: Normal range of motion and neck supple.  Neurological: She is alert and oriented to person, place, and time.  Skin: Skin is warm and dry. No rash noted. She is not diaphoretic.  Psychiatric: Memory, affect and judgment normal. Her mood appears anxious.  Nursing note and vitals reviewed.   Urgent Care Treatments / Results:   LABS: PLEASE NOTE: all labs that were ordered this encounter are listed, however only abnormal results are displayed. Labs Reviewed  CBC WITH DIFFERENTIAL/PLATELET - Abnormal; Notable for the following components:      Result Value   Hemoglobin 11.9 (*)    RDW 17.2 (*)    All other components within normal limits  COMPREHENSIVE METABOLIC PANEL - Abnormal; Notable for the following components:   Glucose, Bld 120 (*)    All other components within normal limits    URGENT CARE ECG REPORT Date: 03/20/2019 Time ECG obtained: 1321 PM Rate: 99 bpm Rhythm: atrial fibrillation Intervals: Prolonged QTc - 462 ms ST segment and T wave changes: Non-specific T waves changes.  Comparison: Similar to previous tracing obtained on 02/03/2019.    RADIOLOGY: DG Chest 2 View  Result Date: 03/20/2019 CLINICAL DATA:  Progressive shortness of breath which began 2 days ago. EXAM: CHEST - 2 VIEW COMPARISON:  Chest x-ray dated 02/03/2019 FINDINGS: There is an extensive infiltrate in the right mid and lower  lung zones. Chronic cardiomegaly. Pulmonary vascularity is normal. No discrete infiltrates on the left. No discrete pleural effusions. No acute bone abnormality. IMPRESSION: 1. Extensive infiltrate in the right mid and lower lung zones consistent with recurrent pneumonia. 2. Chronic cardiomegaly. Electronically Signed   By: 02/05/2019 M.D.   On: 03/20/2019 13:43    PROCEDURES: Procedures  MEDICATIONS RECEIVED THIS VISIT: Medications - No data to display  PERTINENT CLINICAL COURSE NOTES/UPDATES: Clinical Course   Thu Mar 20, 2019  1345 Patient requires admission. [facility] charge nurse Mar 22, 2019, RN) made aware of need  for transfer and admission. PIV in place. No medications given. Will arrive via EMS transport.    [BG]    Clinical Course User Index [BG] Verlee Monte, NP   Initial Impression / Assessment and Plan / Urgent Care Course:  Pertinent labs & imaging results that were available during my care of the patient were personally reviewed by me and considered in my medical decision making (see lab/imaging section of note for values and interpretations).  Katherine Scott is a 58 y.o. female who presents to [facility] today with complaints of Shortness of Breath  Patient presents to the urgent care clinic today in acute respiratory distress. RA oximetry revealed a saturation of 79%. She was tachyacardic and tachypneic. Supplemental oxygen applied at 2L/[place] with (+) clinical improvement. Patient became less anxious and was able to provide HPI/PMH information. Given symptoms and history, the decision was made to proceed with workup as follows:   CBC reveals no leukocytosis; WBC 7.9 K/uL. Hgb 11.9, Hct 39.3, MCV 90.6, MCH 27.4, and platelets 181,000.    CMP reveals no electrolyte derangements. LFTs and renal function normal.   EKG shows rate controlled atrial fibrillation at a rate of 99 bpm.   There are non-specific T wave abnormalities in the lateral leads.   QTc prolonged at 462  ms  Patient continues on daily anticoagulation (apixaban).   CXR with extensive infiltrates in the RML and RLL consistent with recurrent pneumonia.    Decision made to defer further lab testing to the staff at the hospital due to the associated TAT associated with certain labs. She will need labs that cannot be performed in our setting as well. Will need SARS-CoV-2 (novel coronavirus) testing, however given clinical condition will not delay transport in order to obtain this testing as the ED has access to testing that is quicker than what can be done here.  Results reviewed with patient. Patient's presenting symptoms today are felt to require further evaluation and intervention in a setting that is capable of providing a higher level of care. I discussed with her that she is not safe to go home with her hypoxia and recurrent pneumonia. Patient is going to need additional labs, IV ABX, cardiac monitoring, and admission to the hospital in order to prevent potentially life threatening complications. I discussed with patient that her problems today are outside of the capabilities of this outpatient urgent care setting, thus my recommendations are to have her seen in the emergency department at either [facility] or [facility]. After discussion, patient notes that she would like to be seen at [facility]. Due to current clinical condition, patient will need to be transported to the emergency department via EMS for further evaluation as recommended. Patient in agreement. PIV in place from initial blood draw; saline locked; no medications given. VS updated by RN; see VSFS (stable). EMS contacted by clinic support staff for patient transport.  Patient report called to [facility] emergency department staff; spoke with Veto Kemps, RN. Nurse was advised of patient's presenting complaints, assessment in clinic, findings from work up thus far, and plans for patient to present there for ongoing evaluation and management of her symptoms.  Questions fielded. Nurse advised to return a call to [facility] with any questions or concerns pertaining to the care that Katherine Denver received here today. Hospital staff aware that patient will be presenting to their facility via EMS today.    Final Clinical Impressions / Urgent Care Diagnoses:   Final diagnoses:  Pneumonia of right lung due to infectious organism, unspecified part of lung  Hypoxia  SOB (shortness of breath)  Atrial fibrillation, unspecified type ([place])    New Prescriptions:  [place] Controlled Substance Registry consulted? Not Applicable  No orders of the defined types were placed in this encounter.   Recommended Follow up Care:   Follow-up Information    Go to  [facility].   Specialty: Emergency Medicine Why: EMS to [facility] - anticipate admission Contact information: [place] [id number] [address] [place] [phone]        NOTE: This note was prepared using Dragon dictation software along with smaller phrase technology. Despite my best ability to proofread, there is the potential that transcriptional errors may still occur from this process, and are completely unintentional.     Karen Kitchens, NP 03/21/19 2033

## 2019-03-21 MED ORDER — APIXABAN 5 MG PO TABS: 5.0000 mg | ORAL_TABLET | Freq: Two times a day (BID) | ORAL | Status: DC

## 2019-03-21 MED ORDER — SODIUM CHLORIDE 0.9 % IV SOLN
100.0000 mg | Freq: Two times a day (BID) | INTRAVENOUS | Status: DC
  Administered 2019-03-21: 100 mg via INTRAVENOUS
  Filled 2019-03-21 (×2): qty 100

## 2019-03-21 MED ORDER — FUROSEMIDE 40 MG PO TABS: 40.0000 mg | ORAL_TABLET | Freq: Every day | ORAL | Status: DC

## 2019-03-21 MED ORDER — DOXYCYCLINE HYCLATE 100 MG PO TABS: 100.0000 mg | ORAL_TABLET | Freq: Two times a day (BID) | ORAL | 0 refills | Status: AC

## 2019-03-21 MED ORDER — LEVOTHYROXINE SODIUM 75 MCG PO TABS: 75.0000 ug | ORAL_TABLET | Freq: Every day | ORAL | Status: DC

## 2019-03-21 MED ORDER — DILTIAZEM HCL ER COATED BEADS 240 MG PO CP24: 240.0000 mg | ORAL_CAPSULE | Freq: Every day | ORAL | Status: DC

## 2019-03-21 MED ORDER — VITAMIN B-12 1000 MCG PO TABS: 2000.0000 ug | ORAL_TABLET | Freq: Every day | ORAL | Status: DC

## 2019-03-21 MED ORDER — AMIODARONE HCL 200 MG PO TABS: 200.0000 mg | ORAL_TABLET | Freq: Two times a day (BID) | ORAL | Status: DC

## 2019-03-21 MED ORDER — ALBUTEROL SULFATE HFA 108 (90 BASE) MCG/ACT IN AERS: 2.0000 | INHALATION_SPRAY | Freq: Four times a day (QID) | RESPIRATORY_TRACT | Status: DC | PRN

## 2019-03-21 MED ORDER — AMOXICILLIN-POT CLAVULANATE 875-125 MG PO TABS: 1.0000 | ORAL_TABLET | Freq: Two times a day (BID) | ORAL | 0 refills | Status: AC

## 2019-03-21 MED ORDER — POTASSIUM CHLORIDE CRYS ER 20 MEQ PO TBCR: 20.0000 meq | EXTENDED_RELEASE_TABLET | Freq: Every day | ORAL | Status: DC

## 2019-03-21 NOTE — Discharge Summary (Signed)
Physician Discharge Summary  Katherine Scott:[id number] DOB: [birth year] DOA: 03/20/2019  PCP: Smitty Cords, DO  Admit date: 03/20/2019 Discharge date: 03/21/2019  Discharge disposition: She left AGAINST MEDICAL ADVICE   Recommendations for Outpatient Follow-Up:   Outpatient follow-up with PCP as soon as possible  Discharge Diagnosis:   Active Problems:   Pneumonia   Acute respiratory failure with hypoxia (HCC)    Discharge Condition: Stable.  Diet recommendation: Low-salt diet  Code status: Full code    Hospital Course:    Katherine Scott is an 58 y.o. female with medical history significant for atrial fibrillation on Eliquis, COPD, morbid obesity, OSA on CPAP at night, chronic hypoxemic respiratory failure on 2 L of oxygen, recent hospitalization for bilateral pneumonia in October 2020, who presented to the hospital at the behest of her primary care physician for management of pneumonia discovered on chest x-ray that was done as an outpatient.  She also complained of increasing shortness of breath of about 4 days duration prior to admission.  In the emergency room, she was hypoxemic with oxygen saturation of 79% and chest x-ray showed extensive infiltrates in the right mid and lower zones of the lung.  She was treated with empiric IV antibiotics.  She felt better the following day and insisted on going home because she had spends the night in the emergency room since there were no beds available on the floors.  She said she could no longer wait for a bed assignment.  She was told her she was not quite ready for discharge yet and she was informed of the risks of leaving AGAINST MEDICAL ADVICE including but not limited to worsening pneumonia, sepsis, worsening respiratory failure, cardiopulmonary arrest and death.  She requested prescription for antibiotics dose was given amoxicillin and doxycycline.      Discharge Exam:   Vitals:   03/21/19 0430 03/21/19 0800    BP: (!) 105/44 115/68  Pulse:    Resp: (!) 22 (!) 21  Temp:    SpO2: 92% 91%   Vitals:   03/21/19 0230 03/21/19 0315 03/21/19 0430 03/21/19 0800  BP: 136/74 111/67 (!) 105/44 115/68  Pulse:  82    Resp: 20 (!) 23 (!) 22 (!) 21  Temp:      TempSrc:      SpO2: 96% 91% 92% 91%  Weight:      Height:           The results of significant diagnostics from this hospitalization (including imaging, microbiology, ancillary and laboratory) are listed below for reference.     Procedures and Diagnostic Studies:   DG Chest 2 View  Result Date: 03/20/2019 CLINICAL DATA:  Progressive shortness of breath which began 2 days ago. EXAM: CHEST - 2 VIEW COMPARISON:  Chest x-ray dated 02/03/2019 FINDINGS: There is an extensive infiltrate in the right mid and lower lung zones. Chronic cardiomegaly. Pulmonary vascularity is normal. No discrete infiltrates on the left. No discrete pleural effusions. No acute bone abnormality. IMPRESSION: 1. Extensive infiltrate in the right mid and lower lung zones consistent with recurrent pneumonia. 2. Chronic cardiomegaly. Electronically Signed   By: Francene Boyers M.D.   On: 03/20/2019 13:43     Labs:   Basic Metabolic Panel: Recent Labs  Lab 03/20/19 1358 03/20/19 1619  NA 141 143  K 4.1 3.8  CL 103 102  CO2 30 33*  GLUCOSE 120* 111*  BUN 17 15  CREATININE 0.92 0.88  CALCIUM 8.9 8.5*  GFR Estimated Creatinine Clearance: 105.1 mL/min (by C-G formula based on SCr of 0.88 mg/dL). Liver Function Tests: Recent Labs  Lab 03/20/19 1358  AST 15  ALT 22  ALKPHOS 66  BILITOT 0.9  PROT 7.3  ALBUMIN 4.0   No results for input(s): LIPASE, AMYLASE in the last 168 hours. No results for input(s): AMMONIA in the last 168 hours. Coagulation profile No results for input(s): INR, PROTIME in the last 168 hours.  CBC: Recent Labs  Lab 03/20/19 1358 03/20/19 1619  WBC 7.9 8.2  NEUTROABS 6.4 6.7  HGB 11.9* 11.4*  HCT 39.3 37.3  MCV 90.6 88.8  PLT  181 187   Cardiac Enzymes: No results for input(s): CKTOTAL, CKMB, CKMBINDEX, TROPONINI in the last 168 hours. BNP: Invalid input(s): POCBNP CBG: Recent Labs  Lab 03/20/19 2126  GLUCAP 121*   D-Dimer No results for input(s): DDIMER in the last 72 hours. Hgb A1c No results for input(s): HGBA1C in the last 72 hours. Lipid Profile No results for input(s): CHOL, HDL, LDLCALC, TRIG, CHOLHDL, LDLDIRECT in the last 72 hours. Thyroid function studies No results for input(s): TSH, T4TOTAL, T3FREE, THYROIDAB in the last 72 hours.  Invalid input(s): FREET3 Anemia work up No results for input(s): VITAMINB12, FOLATE, FERRITIN, TIBC, IRON, RETICCTPCT in the last 72 hours. Microbiology No results found for this or any previous visit (from the past 240 hour(s)).   Discharge Instructions:   Discharge Instructions    Diet - low sodium heart healthy   Complete by: As directed    Discharge instructions   Complete by: As directed    Return to ED as soon as possible if symptoms worsen. Follow up with PCP in 3 to 5 days   Increase activity slowly   Complete by: As directed      Allergies as of 03/21/2019   No Known Allergies     Medication List    TAKE these medications   albuterol 108 (90 Base) MCG/ACT inhaler Commonly known as: VENTOLIN HFA Inhale 2 puffs into the lungs every 6 (six) hours as needed for wheezing.   amiodarone 200 MG tablet Commonly known as: PACERONE Take 200-400 mg by mouth 2 (two) times daily.   amoxicillin-clavulanate 875-125 MG tablet Commonly known as: Augmentin Take 1 tablet by mouth 2 (two) times daily for 6 days.   cyanocobalamin 2000 MCG tablet Take 2,000 mcg by mouth daily.   diltiazem 240 MG 24 hr capsule Commonly known as: CARDIZEM CD Take 240 mg by mouth daily after breakfast.   doxycycline 100 MG tablet Commonly known as: VIBRA-TABS Take 1 tablet (100 mg total) by mouth 2 (two) times daily for 6 days.   Eliquis 5 MG Tabs tablet Generic  drug: apixaban Take 5 mg by mouth 2 (two) times daily.   furosemide 40 MG tablet Commonly known as: LASIX Take 40 mg by mouth daily.   levothyroxine 75 MCG tablet Commonly known as: SYNTHROID Take 1 tablet (75 mcg total) by mouth daily before breakfast.   OXYGEN Inhale 2 L/L into the lungs.   potassium chloride SA 20 MEQ tablet Commonly known as: KLOR-CON Take 1 tablet (20 mEq total) by mouth daily. What changed: when to take this   [place] 1 Device by Does not apply route as needed.         Time coordinating discharge: 26 minutes  Signed:  Ellarose Brandi  [facility] 03/21/2019, 9:10 AM

## 2019-03-24 ENCOUNTER — Telehealth: Payer: Self-pay

## 2019-03-24 NOTE — Telephone Encounter (Signed)
No TCM completed. [employer]

## 2019-03-31 ENCOUNTER — Ambulatory Visit
Admission: RE | Admit: 2019-03-31 | Discharge: 2019-03-31 | Disposition: A | Discharge status: Home / Self Care | Payer: BC Managed Care – PPO | Attending: Family Medicine | Admitting: Family Medicine

## 2019-03-31 ENCOUNTER — Encounter: Payer: Self-pay | Admitting: Family Medicine

## 2019-03-31 ENCOUNTER — Ambulatory Visit
Admission: RE | Admit: 2019-03-31 | Discharge: 2019-03-31 | Disposition: A | Discharge status: Home / Self Care | Payer: BC Managed Care – PPO | Source: Ambulatory Visit | Attending: Family Medicine | Admitting: Family Medicine

## 2019-03-31 ENCOUNTER — Ambulatory Visit (INDEPENDENT_AMBULATORY_CARE_PROVIDER_SITE_OTHER): Payer: BC Managed Care – PPO | Admitting: Family Medicine

## 2019-03-31 ENCOUNTER — Other Ambulatory Visit: Payer: Self-pay

## 2019-03-31 VITALS — BP 136/76 | HR 73 | Temp 97.8°F | Resp 16 | Ht 62.0 in | Wt 357.0 lb

## 2019-03-31 DIAGNOSIS — J449 Chronic obstructive pulmonary disease, unspecified: Secondary | ICD-10-CM

## 2019-03-31 DIAGNOSIS — J189 Pneumonia, unspecified organism: Secondary | ICD-10-CM | POA: Diagnosis not present

## 2019-03-31 DIAGNOSIS — J9601 Acute respiratory failure with hypoxia: Secondary | ICD-10-CM | POA: Diagnosis not present

## 2019-03-31 DIAGNOSIS — E662 Morbid (severe) obesity with alveolar hypoventilation: Secondary | ICD-10-CM

## 2019-03-31 DIAGNOSIS — J849 Interstitial pulmonary disease, unspecified: Secondary | ICD-10-CM

## 2019-03-31 MED ORDER — ANORO ELLIPTA 62.5-25 MCG/INH IN AEPB: 1.0000 | INHALATION_SPRAY | Freq: Every day | RESPIRATORY_TRACT | 2 refills | Status: DC

## 2019-03-31 NOTE — Patient Instructions (Addendum)
Thank you for coming to the office today.  Chest X_ray  Results stay tuned for information.  Continue anoro, refilled  [facility]  Likely return date 04/14/19 unless we change it.  Please schedule a Follow-up Appointment to: Return in about 2 weeks (around 04/14/2019), or if symptoms worsen or fail to improve, for pneumonia, COPD.  If you have any other questions or concerns, please feel free to call the office or send a message through [place]. You may also schedule an earlier appointment if necessary.  Additionally, you may be receiving a survey about your experience at our office within a few days to 1 week by e-mail or mail. We value your feedback.  Nobie Putnam, DO [place]

## 2019-03-31 NOTE — Progress Notes (Signed)
Subjective:    [name], female    DOB: [birth year], 58 y.o.   MRN: [id number]  Katherine DenverKay H Nunley is a 58 y.o. female presenting on 03/31/2019 for Hospitalization Follow-up (respiratory failure with hypoxia )   HPI   Follow-up Dyspnea / Recent pneumonia / Respiratory Failure Recent course. Cleared to go back to work on 03/10/19. She worked until 03/20/19 Described shortness of breath and worsening symptoms of pneumonia and respiratory failure, [facility]. [facility] at that time on 03/20/19. - She was placed on IV antibiotics at that time in ED, Zosyn and Azithromycin and on oxygen. She had negative COVID19 test on 03/20/19. She was maintained on 2L nasal cannula O2 supplemental, had reviewed X-ray showed significant R sided focal pneumonia, question if it was unresolved from prior pneumonia multifocal in 02/2019 or if it was new recurrence. She was treated for Health-care associated PNA. She did require up to max 4 L O2 in ED. Labs reviewed  - She had problem with very long wait time awaiting for admission, she did not have much PO or regular medications. - History of COPD - Discharged from ED on Augmentin BID x 6 days and Doxycycline x 6 days - she has completed and did well. No further steroid. Last course in 02/2019 with similar issue she was treated with extended prednisone burst and Anoro new start, with some improvement, thought COPD component. - Today request refill on Anoro. - Still overall improved from where she was, but she is still hesitant with her breathing, and feels not back to normal yet. Asking about X-ray. - Admits dyspnea with increased exertion. 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  Paroxysmal Atrial Fibrillation History AFib, since 2013, previous cardioversion usually put into rhythm. Now seems Paroxysmal, on Amiodarone with hopes for chemical cardioversion Followed by [facility]   [facility] 2/9 03/31/2019 02/14/2019 08/13/2018  Decreased Interest 0 0 0  Down, Depressed, Hopeless 0 0 0  PHQ - 2 Score 0 0 0  Altered sleeping - - -  Tired, decreased energy - - -  Change in appetite - - -  Feeling bad or failure about yourself  - - -  Trouble concentrating - - -  Moving slowly or fidgety/restless - - -  Suicidal thoughts - - -  PHQ-9 Score - - -    Social History   Tobacco Use  . Smoking status: Never Smoker  . Smokeless tobacco: Never Used  Substance Use Topics  . Alcohol use: No  . Drug use: No    Review of Systems Per HPI unless specifically indicated above     Objective:    BP 136/76   Pulse 73   Temp 97.8 F (36.6 C) (Oral)   Resp 16   Ht 5\' 2"  (1.575 m)   Wt (!) 357 lb (161.9 kg)   SpO2 94%   BMI 65.30 kg/m   Wt Readings from Last 3 Encounters:  03/31/19 (!) 357 lb (161.9 kg)  03/20/19 (!) 353 lb (160.1 kg)  03/20/19 (!) 353 lb (160.1 kg)    Physical Exam Vitals and nursing note reviewed.  Constitutional:      General: She is not in acute distress.    Appearance: She is well-developed. She is not diaphoretic.     Comments: Well-appearing, comfortable, cooperative  HENT:     Head: Normocephalic and atraumatic.  Eyes:     General:        Right eye: No discharge.        Left eye: No discharge.     Conjunctiva/sclera: Conjunctivae normal.  Neck:     Thyroid: No thyromegaly.  Cardiovascular:     Rate and Rhythm: Normal rate and regular rhythm.     Heart sounds: Normal heart sounds. No murmur.  Pulmonary:     Effort: Pulmonary effort is normal. No respiratory distress.     Breath sounds: No wheezing or rales.     Comments: Reduced air movement bilateral, large body habitus and some overall reduced air in general. Non focal. No wheezing or other focal abnormality. Musculoskeletal:        General: Normal range of motion.     Cervical back: Normal range of motion and neck supple.  Lymphadenopathy:      Cervical: No cervical adenopathy.  Skin:    General: Skin is warm and dry.     Findings: No erythema or rash.  Neurological:     Mental Status: She is alert and oriented to person, place, and time.  Psychiatric:        Behavior: Behavior normal.     Comments: Well groomed, good eye contact, normal speech and thoughts      NEW X-ray from today 03/31/19  CLINICAL DATA:  Bilateral pneumonia  EXAM: CHEST - 2 VIEW  COMPARISON:  03/20/2019  FINDINGS: Improved airspace opacity in the right lung. Bilateral indistinct coarse interstitial accentuation. Indistinct pulmonary vasculature. Mild cardiomegaly. Atherosclerotic calcification of the aortic arch. Blurred lateral projection.  IMPRESSION: 1. Indistinct interstitial accentuation in the lungs. Possibilities include pulmonary venous hypertension with mild interstitial edema versus interstitial pneumonia or chronic interstitial lung disease. High-resolution chest CT could be utilized for further characterization. 2. The airspace opacity in the right lung has resolved. 3. Mild cardiomegaly. 4.  Aortic Atherosclerosis (ICD10-I70.0).   Electronically Signed   By: Gaylyn Rong M.D.   On: 03/31/2019 16:41   OLD X-RAY from 03/20/19 CLINICAL DATA: Progressive shortness of breath which began 2 days ago.  EXAM: CHEST - 2 VIEW  COMPARISON: Chest x-ray dated 02/03/2019  FINDINGS: There is an extensive infiltrate in the right mid and lower lung zones. Chronic cardiomegaly. Pulmonary vascularity is normal. No discrete infiltrates on the left. No discrete pleural effusions. No acute bone abnormality.  IMPRESSION: 1. Extensive infiltrate in the right mid and lower lung zones consistent with recurrent pneumonia. 2. Chronic cardiomegaly.   Electronically Signed By: Francene Boyers M.D. On: 03/20/2019 13:43   Results for orders placed or performed during the hospital encounter of 03/20/19  Basic metabolic  panel  Result Value Ref Range   Sodium 143 135 - 145 mmol/L   Potassium 3.8 3.5 - 5.1 mmol/L   Chloride 102 98 - 111 mmol/L   CO2 33 (H) 22 - 32 mmol/L   Glucose, Bld 111 (H) 70 - 99 mg/dL   BUN 15 6 - 20 mg/dL   Creatinine, Ser 4.27 0.44 - 1.00 mg/dL   Calcium 8.5 (L) 8.9 - 10.3 mg/dL   GFR calc non Af Amer >60 >60 mL/min   GFR calc Af Amer >60 >60 mL/min   Anion gap 8 5 - 15  Brain natriuretic peptide  Result Value Ref Range   B Natriuretic Peptide 106.0 (H) 0.0 - 100.0 pg/mL  CBC with Differential  Result Value Ref Range   WBC 8.2 4.0 - 10.5 K/uL   RBC 4.20  3.87 - 5.11 MIL/uL   Hemoglobin 11.4 (L) 12.0 - 15.0 g/dL   HCT 37.3 36.0 - 46.0 %   MCV 88.8 80.0 - 100.0 fL   MCH 27.1 26.0 - 34.0 pg   MCHC 30.6 30.0 - 36.0 g/dL   RDW 17.0 (H) 11.5 - 15.5 %   Platelets 187 150 - 400 K/uL   nRBC 0.0 0.0 - 0.2 %   Neutrophils Relative % 81 %   Neutro Abs 6.7 1.7 - 7.7 K/uL   Lymphocytes Relative 10 %   Lymphs Abs 0.8 0.7 - 4.0 K/uL   Monocytes Relative 7 %   Monocytes Absolute 0.5 0.1 - 1.0 K/uL   Eosinophils Relative 1 %   Eosinophils Absolute 0.1 0.0 - 0.5 K/uL   Basophils Relative 1 %   Basophils Absolute 0.1 0.0 - 0.1 K/uL   Immature Granulocytes 0 %   Abs Immature Granulocytes 0.03 0.00 - 0.07 K/uL  Procalcitonin - Baseline  Result Value Ref Range   Procalcitonin <0.10 ng/mL  Glucose, capillary  Result Value Ref Range   Glucose-Capillary 121 (H) 70 - 99 mg/dL  POC SARS Coronavirus 2 Ag  Result Value Ref Range   SARS Coronavirus 2 Ag NEGATIVE NEGATIVE  Troponin I (High Sensitivity)  Result Value Ref Range   Troponin I (High Sensitivity) 3 <18 ng/L      Assessment & Plan:   Problem List Items Addressed This Visit    Pneumonia - Primary   Relevant Medications   ANORO ELLIPTA 62.5-25 MCG/INH AEPB   Other Relevant Orders   DG Chest 2 View (Completed)   Ambulatory referral to Pulmonology   Acute respiratory failure with hypoxia ([place])   Relevant Orders    Ambulatory referral to Pulmonology    Other Visit Diagnoses    Obesity hypoventilation syndrome ([place])       Relevant Orders   Ambulatory referral to Pulmonology   Recurrent pneumonia       Relevant Medications   ANORO ELLIPTA 62.5-25 MCG/INH AEPB   Other Relevant Orders   DG Chest 2 View (Completed)   Ambulatory referral to Pulmonology   Chronic obstructive pulmonary disease, unspecified COPD type ([place])       Relevant Medications   ANORO ELLIPTA 62.5-25 MCG/INH AEPB   Other Relevant Orders   DG Chest 2 View (Completed)   Ambulatory referral to Pulmonology      Complicated course over past 1-2 months, with recent hospitalizations and UC / ED visits due to dyspnea secondary to acute respiratory failure, bilateral pneumonia  Now seems that pneumonia has resolved clinically on repeat course antibiotics Still having issue with dyspnea, thought to be underlying chronic lung disease - likely COPD, vs interstitial problem or obesity hypoventilation possibility, cannot rule out cardiac etiology, currently followed by Cards for Paroxysmal AFib as well on treatment.  Recently was on Amiodarone October to November for 1 month. Now off this med. Considered possible pulmonary fibrosis secondary to this med. But she remains off med now, so that is reassuring, but perhaps further imaging would clarify.  X-ray today to re-evaluate R sided lower infiltrate PNA - now clinically resolving. No other signs of infection.  Determine if any antibiotics or steroids after X-ray results  Re order new rx Anoro daily for presumed COPD maintenance  Referral to [facility] Pulmonology given complicated course with recurrent PNA / possible COPD vs Interstitial lung disease and complicated by AFib as well.  Difficulty resuming work, and seems may need further diagnostic  evaluation.  Anticipated return to work within 1-2 weeks, or TBD pending Pulmonology apt.  **Reviewed X-ray after 5pm today, see results copied  above, showed resolved RLL infiltrate which is good news, no more antibiotics. But it did show concern for interstitial lung disease - recommended High res CT - will proceed with f/u with Pulm to pursue this imaging and further treatment recommendations**  Orders Placed This Encounter  Procedures  . DG Chest 2 View    Standing Status:   Future    Number of Occurrences:   1    Standing Expiration Date:   05/31/2020    Order Specific Question:   Reason for Exam (SYMPTOM  OR DIAGNOSIS REQUIRED)    Answer:   follow-up right lower pneumonia, recent x-ray    Order Specific Question:   Is patient pregnant?    Answer:   No    Order Specific Question:   Preferred imaging location?    Answer:   [facility]    Order Specific Question:   Radiology Contrast Protocol - do NOT remove file path    Answer:   [email or web address]  . Ambulatory referral to Pulmonology    Referral Priority:   Routine    Referral Type:   Consultation    Referral Reason:   Specialty Services Required    Requested Specialty:   Pulmonary Disease    Number of Visits Requested:   1      Meds ordered this encounter  Medications  . ANORO ELLIPTA 62.5-25 MCG/INH AEPB    Sig: Inhale 1 puff into the lungs daily.    Dispense:  60 each    Refill:  2    Follow up plan: Return in about 2 weeks (around 04/14/2019), or if symptoms worsen or fail to improve, for pneumonia, COPD.    Saralyn Pilar, DO [facility] 03/31/2019, 10:25 AM

## 2019-04-02 NOTE — Progress Notes (Signed)
Synopsis: Referred in Dec 2020 for Recurrent pneumonia by Nobie Putnam *  Subjective:   [name] GENDER: female DOB: May 28, 1960, MRN: [id number]  Chief Complaint  Patient presents with  . Consult    Patient has recurrent Pneumonia and shortness of breath all the time. Oct 26 was hospitalized for Pneumonia and respitory failure. Chest x-ray done Monday.     HPI 58 year old woman with a history of atrial fibrillation on anticoagulation presenting for recurrent admissions for "pneumonia."  Pretty clear correlation with starting amiodarone. Dyspnea, cough, congestion after starting Diagnosed with bilateral pneumonia requiring 2 admissions stopped on med, slowly getting better but not back to baseline [facility] 2 Some lingering inflammation on CXR for which she is referred to pulm  Has CPAP, using, no one monitoring download  Afib is asymptomatic for most part  ROS as below   ROS Positive Symptoms in bold:  Constitutional fevers, chills, weight loss, fatigue, anorexia, malaise  Eyes decreased vision, double vision, eye irritation  Ears, Nose, Mouth, Throat sore throat, trouble swallowing, sinus congestion  Cardiovascular chest pain, paroxysmal nocturnal dyspnea, lower ext edema, palpitations   Respiratory SOB, cough, DOE, hemoptysis, wheezing  Gastrointestinal nausea, vomiting, diarrhea  Genitourinary burning with urination, trouble urinating  Musculoskeletal joint aches, joint swelling, back pain  Integumentary  rashes, skin lesions  Neurological focal weakness, focal numbness, trouble speaking, headaches  Psychiatric depression, anxiety, confusion  Endocrine polyuria, polydipsia, cold intolerance, heat intolerance  Hematologic abnormal bruising, abnormal bleeding, unexplained nose bleeds  Allergic/Immunologic recurrent infections, hives, swollen lymph nodes    Past Medical History:  Diagnosis Date  . Abnormal mammogram, unspecified 2013  .  Atrial fibrillation ([place])    2013  . Breast screening, unspecified   . COPD (chronic obstructive pulmonary disease) ([place])   . History of continuous positive airway pressure (CPAP) therapy at home   . Obesity, unspecified   . Screening for obesity   . Special screening for malignant neoplasms, colon   . Unspecified essential hypertension      Family History  Problem Relation Age of Onset  . Colon cancer Mother        diagnosis age 47's  . Colon cancer Sister        diagnosis age 70  . Atrial fibrillation Sister   . Breast cancer Other        Maternal Grandmother; diagnosis age 58's  . Lung cancer Other        Paternal Grandmother; diagnosis age 63's  . Colon cancer Other        Paternal Grandfather; diagnosis age 48's  . Heart disease Father      Past Surgical History:  Procedure Laterality Date  . ABDOMINAL HYSTERECTOMY  2011  . ANTERIOR CRUCIATE LIGAMENT REPAIR  2003  . BREAST BIOPSY Left January 22, 2012   left breast stereotactic biopsy showed fibroadenomatous changes with microcalcifications, fat necrosis, and sclerosing adenosis and columnar cell changes. No evidence of atypia or malignancy.  Marland Kitchen CARDIOVERSION N/A 09/22/2016   Procedure: CARDIOVERSION;  Surgeon: Teodoro Spray, MD;  Location: [facility];  Service: Cardiovascular;  Laterality: N/A;  . CARDIOVERSION N/A 01/17/2018   Procedure: CARDIOVERSION;  Surgeon: Teodoro Spray, MD;  Location: [facility];  Service: Cardiovascular;  Laterality: N/A;  . CARDIOVERSION N/A 01/09/2019   Procedure: CARDIOVERSION;  Surgeon: Teodoro Spray, MD;  Location: [facility];  Service: Cardiovascular;  Laterality: N/A;  . [place]  . COLONOSCOPY  2011  Dr. Mechele CollinElliott  . ELECTROPHYSIOLOGIC STUDY N/A 12/03/2014   Procedure: Cardioversion;  Surgeon: Dalia HeadingKenneth A Fath, MD;  Location: [facility];  Service: Cardiovascular;  Laterality: N/A;  . ELECTROPHYSIOLOGIC STUDY N/A 04/01/2015   Procedure: Cardioversion;  Surgeon:  Dalia HeadingKenneth A Fath, MD;  Location: [facility];  Service: Cardiovascular;  Laterality: N/A;  . KNEE SURGERY  2013    Social History   Socioeconomic History  . Marital status: Divorced    Spouse name: Not on file  . Number of children: Not on file  . Years of education: Not on file  . Highest education level: Not on file  Occupational History  . Not on file  Tobacco Use  . Smoking status: Never Smoker  . Smokeless tobacco: Never Used  Substance and Sexual Activity  . Alcohol use: No  . Drug use: No  . Sexual activity: Not on file  Other Topics Concern  . Not on file  Social History Narrative  . Not on file   Social Determinants of Health   Financial Resource Strain:   . Difficulty of Paying Living Expenses: Not on file  Food Insecurity:   . Worried About Programme researcher, broadcasting/film/videounning Out of Food in the Last Year: Not on file  . Ran Out of Food in the Last Year: Not on file  Transportation Needs:   . Lack of Transportation (Medical): Not on file  . Lack of Transportation (Non-Medical): Not on file  Physical Activity:   . Days of Exercise per Week: Not on file  . Minutes of Exercise per Session: Not on file  Stress:   . Feeling of Stress : Not on file  Social Connections:   . Frequency of Communication with Friends and Family: Not on file  . Frequency of Social Gatherings with Friends and Family: Not on file  . Attends Religious Services: Not on file  . Active Member of Clubs or Organizations: Not on file  . Attends BankerClub or Organization Meetings: Not on file  . Marital Status: Not on file  Intimate Partner Violence:   . Fear of Current or Ex-Partner: Not on file  . Emotionally Abused: Not on file  . Physically Abused: Not on file  . Sexually Abused: Not on file     No Known Allergies   Outpatient Medications Prior to Visit  Medication Sig Dispense Refill  . albuterol (VENTOLIN HFA) 108 (90 Base) MCG/ACT inhaler Inhale 2 puffs into the lungs every 6 (six) hours as needed for wheezing.    Marland Kitchen.  apixaban (ELIQUIS) 5 MG TABS tablet Take 5 mg by mouth 2 (two) times daily.     . cyanocobalamin 2000 MCG tablet Take 2,000 mcg by mouth daily.    Marland Kitchen. diltiazem (CARDIZEM CD) 240 MG 24 hr capsule Take 240 mg by mouth daily after breakfast.    . furosemide (LASIX) 40 MG tablet Take 40 mg by mouth daily.  5  . levothyroxine (SYNTHROID) 75 MCG tablet Take 1 tablet (75 mcg total) by mouth daily before breakfast. 90 tablet 1  . OXYGEN Inhale 2 L/L into the lungs as needed.     . potassium chloride SA (K-DUR,KLOR-CON) 20 MEQ tablet Take 1 tablet (20 mEq total) by mouth daily. (Patient taking differently: Take 20 mEq by mouth at bedtime. ) 30 tablet 6  . Spacer/Aero-Holding Chambers DEVI 1 Device by Does not apply route as needed. 1 each 0  . ANORO ELLIPTA 62.5-25 MCG/INH AEPB Inhale 1 puff into the lungs daily. 60 each 2  No facility-administered medications prior to visit.     Objective:  GEN: overweight woman in NAD HEENT: malampatti 4 CV: irregular, ext warm PULM: No wheezing, no accessory muscle use GI: Soft, +BS EXT: No edema, no clubbing NEURO: Moves all 4 ext PSYCH: Good insight, AOx3 SKIN: No rashes   Vitals:   04/03/19 1011  BP: (!) 160/90  Pulse: 97  Temp: (!) 97.2 F (36.2 C)  TempSrc: Temporal  SpO2: 90%  Weight: (!) 361 lb 9.6 oz (164 kg)  Height: 5' 0.63" (1.54 m)   90% on RA BMI Readings from Last 3 Encounters:  04/03/19 69.16 kg/m  03/31/19 65.30 kg/m  03/20/19 62.53 kg/m   Wt Readings from Last 3 Encounters:  04/03/19 (!) 361 lb 9.6 oz (164 kg)  03/31/19 (!) 357 lb (161.9 kg)  03/20/19 (!) 353 lb (160.1 kg)     CBC    Component Value Date/Time   WBC 8.2 03/20/2019 1619   RBC 4.20 03/20/2019 1619   HGB 11.4 (L) 03/20/2019 1619   HGB 9.8 (L) 11/29/2011 0432   HCT 37.3 03/20/2019 1619   HCT 38.7 11/13/2011 1448   PLT 187 03/20/2019 1619   PLT 119 (L) 11/29/2011 0432   MCV 88.8 03/20/2019 1619   MCV 82 11/13/2011 1448   MCH 27.1 03/20/2019 1619    MCHC 30.6 03/20/2019 1619   RDW 17.0 (H) 03/20/2019 1619   RDW 14.0 11/13/2011 1448   LYMPHSABS 0.8 03/20/2019 1619   MONOABS 0.5 03/20/2019 1619   EOSABS 0.1 03/20/2019 1619   BASOSABS 0.1 03/20/2019 1619    CXR bilateral infiltrates that worsened 1 week after starting amiodarone Persisted until amiodarone was stopped Now some mild residual interstitial disease    Assessment & Plan:  # 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  Would avoid further amiodarone and check full PFTs as baseline.  Do not think aggressive ILD workup necessary at present unless worsening. symptoms off amiodarone. - Reassurance provided - Amiodarone has pretty long half life so will take a while for this to fully resolve, this was discussed with patient - Full PFTs - Switch anoro to trelegy (would do breo but no copay cards for this). - 4 week steroid taper 20mg  x 2 weeks then 10mg  x 2 weeks - Televisit in 1 month to ensure continued improvement - O2 at night bled into CPAP and with exertion during day  # Chronic hypercapnea secondary to OSA and OHS- confirmed with ABG 02/03/19 as well as chronic compensatory metabolic alkalosis - Continue CPAP - Needs to lose weight - She will drop off card so we can review effectiveness  # Afib- asymptomatic, on rate control    Current Outpatient Medications:  .  albuterol (VENTOLIN HFA) 108 (90 Base) MCG/ACT inhaler, Inhale 2 puffs into the lungs every 6 (six) hours as needed for wheezing., Disp: , Rfl:  .  apixaban (ELIQUIS) 5 MG TABS tablet, Take 5 mg by mouth 2 (two) times daily. , Disp: , Rfl:  .  cyanocobalamin 2000 MCG tablet, Take 2,000 mcg by mouth daily., Disp: , Rfl:  .  diltiazem (CARDIZEM CD) 240 MG 24 hr capsule, Take 240 mg by mouth daily after breakfast., Disp: , Rfl:  .  furosemide (LASIX) 40 MG tablet, Take 40 mg by mouth daily., Disp: , Rfl: 5 .  levothyroxine (SYNTHROID) 75 MCG tablet, Take 1 tablet (75 mcg total)  by mouth daily before breakfast., Disp: 90 tablet, Rfl: 1 .  OXYGEN, Inhale 2 L/L into the lungs as needed. , Disp: , Rfl:  .  potassium chloride SA (K-DUR,KLOR-CON) 20 MEQ tablet, Take 1 tablet (20 mEq total) by mouth daily. (Patient taking differently: Take 20 mEq by mouth at bedtime. ), Disp: 30 tablet, Rfl: 6 .  Spacer/Aero-Holding Rudean Curt, 1 Device by Does not apply route as needed., Disp: 1 each, Rfl: 0 .  Fluticasone-Umeclidin-Vilant (TRELEGY ELLIPTA) 100-62.5-25 MCG/INH AEPB, Inhale 1 Inhaler into the lungs daily., Disp: 30 each, Rfl: 11 .  predniSONE (DELTASONE) 10 MG tablet, Take 2 tablets (20 mg total) by mouth daily for 14 days, THEN 1 tablet (10 mg total) daily for 14 days., Disp: 42 tablet, Rfl: 0   Lorin Glass, MD [facility] 04/03/2019 10:39 AM

## 2019-04-03 ENCOUNTER — Ambulatory Visit (INDEPENDENT_AMBULATORY_CARE_PROVIDER_SITE_OTHER): Payer: BC Managed Care – PPO | Admitting: Internal Medicine

## 2019-04-03 ENCOUNTER — Other Ambulatory Visit: Payer: Self-pay

## 2019-04-03 ENCOUNTER — Encounter: Payer: Self-pay | Admitting: Internal Medicine

## 2019-04-03 VITALS — BP 160/90 | HR 97 | Temp 97.2°F | Ht 60.63 in | Wt 361.6 lb

## 2019-04-03 DIAGNOSIS — J984 Other disorders of lung: Secondary | ICD-10-CM

## 2019-04-03 DIAGNOSIS — T462X5A Adverse effect of other antidysrhythmic drugs, initial encounter: Secondary | ICD-10-CM

## 2019-04-03 MED ORDER — PREDNISONE 10 MG PO TABS: ORAL_TABLET | ORAL | 0 refills | Status: AC

## 2019-04-03 MED ORDER — TRELEGY ELLIPTA 100-62.5-25 MCG/INH IN AEPB: 1.0000 | INHALATION_SPRAY | Freq: Every day | RESPIRATORY_TRACT | 11 refills | Status: DC

## 2019-04-03 MED ORDER — BREO ELLIPTA 200-25 MCG/INH IN AEPB: 1.0000 | INHALATION_SPRAY | Freq: Every day | RESPIRATORY_TRACT | 11 refills | Status: DC

## 2019-04-03 NOTE — Patient Instructions (Signed)
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  No more amiodarone.  Start prednisone 20mg  x 2 weeks, 10mg  x 2 weeks then stop  Lung function tests  Touch base with one of my Nps in 1 month over phone to assure continued improvement  Wear oxygen at night and weight exertion  Drop off your CPAP card at some point so we can review download

## 2019-05-02 ENCOUNTER — Institutional Professional Consult (permissible substitution): Payer: BC Managed Care – PPO | Admitting: Pulmonary Disease

## 2019-05-11 ENCOUNTER — Other Ambulatory Visit: Payer: Self-pay | Admitting: Family Medicine

## 2019-05-11 DIAGNOSIS — J189 Pneumonia, unspecified organism: Secondary | ICD-10-CM | POA: Diagnosis not present

## 2019-05-11 DIAGNOSIS — E039 Hypothyroidism, unspecified: Secondary | ICD-10-CM

## 2019-05-20 ENCOUNTER — Other Ambulatory Visit: Payer: Self-pay

## 2019-05-20 ENCOUNTER — Other Ambulatory Visit
Admission: RE | Admit: 2019-05-20 | Discharge: 2019-05-20 | Disposition: A | Discharge status: Home / Self Care | Payer: BC Managed Care – PPO | Source: Ambulatory Visit | Attending: Internal Medicine | Admitting: Internal Medicine

## 2019-05-20 ENCOUNTER — Ambulatory Visit: Payer: BC Managed Care – PPO | Attending: Internal Medicine

## 2019-05-20 DIAGNOSIS — Z01812 Encounter for preprocedural laboratory examination: Secondary | ICD-10-CM | POA: Insufficient documentation

## 2019-05-20 DIAGNOSIS — Z20822 Contact with and (suspected) exposure to covid-19: Secondary | ICD-10-CM | POA: Diagnosis not present

## 2019-05-21 LAB — SARS CORONAVIRUS 2 (TAT 6-24 HRS): SARS Coronavirus 2: NEGATIVE

## 2019-05-23 ENCOUNTER — Encounter: Payer: BC Managed Care – PPO | Admitting: Internal Medicine

## 2019-05-23 ENCOUNTER — Other Ambulatory Visit: Payer: Self-pay

## 2019-05-23 DIAGNOSIS — J984 Other disorders of lung: Secondary | ICD-10-CM

## 2019-05-23 LAB — PULMONARY FUNCTION TEST
DL/VA % pred: 131 %
DL/VA: 5.71 ml/min/mmHg/L
DLCO unc % pred: 88 %
DLCO unc: 16.23 ml/min/mmHg
FEF 25-75 Post: 1.99 L/sec
FEF 25-75 Pre: 1.38 L/sec
FEF2575-%Change-Post: 44 %
FEF2575-%Pred-Post: 87 %
FEF2575-%Pred-Pre: 60 %
FEV1-%Change-Post: 7 %
FEV1-%Pred-Post: 59 %
FEV1-%Pred-Pre: 55 %
FEV1-Post: 1.38 L
FEV1-Pre: 1.29 L
FEV1FVC-%Change-Post: 0 %
FEV1FVC-%Pred-Pre: 105 %
FEV6-%Change-Post: 6 %
FEV6-%Pred-Post: 56 %
FEV6-%Pred-Pre: 53 %
FEV6-Post: 1.64 L
FEV6-Pre: 1.55 L
FEV6FVC-%Change-Post: 0 %
FEV6FVC-%Pred-Post: 103 %
FEV6FVC-%Pred-Pre: 103 %
FVC-%Change-Post: 6 %
FVC-%Pred-Post: 54 %
FVC-%Pred-Pre: 51 %
FVC-Post: 1.65 L
FVC-Pre: 1.55 L
Post FEV1/FVC ratio: 83 %
Post FEV6/FVC ratio: 100 %
Pre FEV1/FVC ratio: 83 %
Pre FEV6/FVC Ratio: 100 %
RV % pred: 85 %
RV: 1.54 L
TLC % pred: 73 %
TLC: 3.38 L

## 2019-05-24 NOTE — Progress Notes (Signed)
Visit done over phone due to COVID pandemic with patient's permission.  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   If persistent symptoms or CXR changes may warrant CTD workup but suspect low yield at this time.  PFTs reassuring against instrinsic lung disease given preserved DLCO/VA.   Overall better than last visit just taking a while to get back to baseline - Prednisone 10mg /day x 1 more month - Trelegy vs. breo - Amiodarone has pretty long half life so will take a while for this to fully resolve, this was discussed with patient - O2 at night bled into CPAP and with exertion during day - In person f/u in 1 month with CXR to see if we can stop prednisone; if persistent changes would do ILD screening labs  # Chronic hypercapnea secondary to OSA and OHS, restrictive lung disease- confirmed with ABG 02/03/19 as well as chronic compensatory metabolic alkalosis, PFTs - Continue CPAP - Needs to lose weight - She will drop off card so we can review effectiveness  # Afib- asymptomatic, on rate control   MDM . I reviewed prior external note(s) from Dr. 02/05/19 on 01/28/2019 . I reviewed the result(s) of PFTs 05/23/19 showing no obstruction, mild-moderate restriction, and preserved DLCO all related to weight . I have ordered another month of prednisone  Independent interpretation of tests . Review of patient's CXR from 03/31/19 show persistent mild interstitial lung disease. The patient's images have been independently reviewed by me.      Subjective:   PATIENT ID: 04/02/19 GENDER: female DOB: 1960/11/15, MRN: [id number]/09/[id number]  No chief complaint on file.   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 Last visit we gave a 2 month course of low dose steroids, started on trelegy, and ordered PFTs. She  presents today for f/u.  59 year old woman with a history of atrial fibrillation on anticoagulation presenting for recurrent admissions for "pneumonia."  Pretty clear correlation with starting amiodarone. Dyspnea, cough, congestion after starting Diagnosed with bilateral pneumonia requiring 2 admissions stopped on med, slowly getting better but not back to baseline [facility] 2 Some lingering inflammation on CXR for which she is referred to pulm  Has CPAP, using, no one monitoring download  Afib is asymptomatic for most part   ROS + symptoms in bold Fevers, chills, weight loss Nausea, vomiting, diarrhea Shortness of breath, wheezing, cough Chest pain, palpitations, lower ext edema   Objective:  Speaking in full sentences on phone Good insight   There were no vitals filed for this visit.   on RA BMI Readings from Last 3 Encounters:  04/03/19 69.16 kg/m  03/31/19 65.30 kg/m  03/20/19 62.53 kg/m   Wt Readings from Last 3 Encounters:  04/03/19 (!) 361 lb 9.6 oz (164 kg)  03/31/19 (!) 357 lb (161.9 kg)  03/20/19 (!) 353 lb (160.1 kg)     Ancillary Information    Past Medical History:  Diagnosis Date  . Abnormal mammogram, unspecified 2013  . Atrial fibrillation (HCC)    2013  . Breast screening, unspecified   . COPD (chronic obstructive pulmonary disease) (HCC)   . History of continuous positive airway pressure (CPAP) therapy at home   . Obesity, unspecified   . Screening for obesity   . Special screening for malignant neoplasms, colon   . Unspecified essential hypertension  Family History  Problem Relation Age of Onset  . Colon cancer Mother        diagnosis age 72's  . Colon cancer Sister        diagnosis age 18  . Atrial fibrillation Sister   . Breast cancer Other        Maternal Grandmother; diagnosis age 52's  . Lung cancer Other        Paternal Grandmother; diagnosis age 70's  . Colon cancer Other        Paternal Grandfather; diagnosis age  30's  . Heart disease Father      Past Surgical History:  Procedure Laterality Date  . ABDOMINAL HYSTERECTOMY  2011  . ANTERIOR CRUCIATE LIGAMENT REPAIR  2003  . BREAST BIOPSY Left January 22, 2012   left breast stereotactic biopsy showed fibroadenomatous changes with microcalcifications, fat necrosis, and sclerosing adenosis and columnar cell changes. No evidence of atypia or malignancy.  Marland Kitchen CARDIOVERSION N/A 09/22/2016   Procedure: CARDIOVERSION;  Surgeon: Dalia Heading, MD;  Location: [facility];  Service: Cardiovascular;  Laterality: N/A;  . CARDIOVERSION N/A 01/17/2018   Procedure: CARDIOVERSION;  Surgeon: Dalia Heading, MD;  Location: [facility];  Service: Cardiovascular;  Laterality: N/A;  . CARDIOVERSION N/A 01/09/2019   Procedure: CARDIOVERSION;  Surgeon: Dalia Heading, MD;  Location: [facility];  Service: Cardiovascular;  Laterality: N/A;  . CESAREAN SECTION  1985, 1987, and 1998  . COLONOSCOPY  2011   Dr. Mechele Collin  . ELECTROPHYSIOLOGIC STUDY N/A 12/03/2014   Procedure: Cardioversion;  Surgeon: Dalia Heading, MD;  Location: [facility];  Service: Cardiovascular;  Laterality: N/A;  . ELECTROPHYSIOLOGIC STUDY N/A 04/01/2015   Procedure: Cardioversion;  Surgeon: Dalia Heading, MD;  Location: [facility];  Service: Cardiovascular;  Laterality: N/A;  . KNEE SURGERY  2013    Social History   Socioeconomic History  . Marital status: Divorced    Spouse name: Not on file  . Number of children: Not on file  . Years of education: Not on file  . Highest education level: Not on file  Occupational History  . Not on file  Tobacco Use  . Smoking status: Never Smoker  . Smokeless tobacco: Never Used  Substance and Sexual Activity  . Alcohol use: No  . Drug use: No  . Sexual activity: Not on file  Other Topics Concern  . Not on file  Social History Narrative  . Not on file   Social Determinants of Health   Financial Resource Strain:   . Difficulty of Paying Living Expenses: Not on  file  Food Insecurity:   . Worried About Programme researcher, broadcasting/film/video in the Last Year: Not on file  . Ran Out of Food in the Last Year: Not on file  Transportation Needs:   . Lack of Transportation (Medical): Not on file  . Lack of Transportation (Non-Medical): Not on file  Physical Activity:   . Days of Exercise per Week: Not on file  . Minutes of Exercise per Session: Not on file  Stress:   . Feeling of Stress : Not on file  Social Connections:   . Frequency of Communication with Friends and Family: Not on file  . Frequency of Social Gatherings with Friends and Family: Not on file  . Attends Religious Services: Not on file  . Active Member of Clubs or Organizations: Not on file  . Attends Banker Meetings: Not on file  . Marital Status: Not on file  Intimate Partner Violence:   . Fear of Current or Ex-Partner: Not on file  . Emotionally Abused: Not on file  . Physically Abused: Not on file  . Sexually Abused: Not on file     No Known Allergies   Outpatient Medications Prior to Visit  Medication Sig Dispense Refill  . albuterol (VENTOLIN HFA) 108 (90 Base) MCG/ACT inhaler Inhale 2 puffs into the lungs every 6 (six) hours as needed for wheezing.    Marland Kitchen apixaban (ELIQUIS) 5 MG TABS tablet Take 5 mg by mouth 2 (two) times daily.     . cyanocobalamin 2000 MCG tablet Take 2,000 mcg by mouth daily.    Marland Kitchen diltiazem (CARDIZEM CD) 240 MG 24 hr capsule Take 240 mg by mouth daily after breakfast.    . Fluticasone-Umeclidin-Vilant (TRELEGY ELLIPTA) 100-62.5-25 MCG/INH AEPB Inhale 1 Inhaler into the lungs daily. 30 each 11  . furosemide (LASIX) 40 MG tablet Take 40 mg by mouth daily.  5  . levothyroxine (SYNTHROID) 75 MCG tablet TAKE 1 TABLET (75 MCG TOTAL) BY MOUTH DAILY BEFORE BREAKFAST. 90 tablet 1  . OXYGEN Inhale 2 L/L into the lungs as needed.     . potassium chloride SA (K-DUR,KLOR-CON) 20 MEQ tablet Take 1 tablet (20 mEq total) by mouth daily. (Patient taking differently: Take 20  mEq by mouth at bedtime. ) 30 tablet 6  . Spacer/Aero-Holding Chambers DEVI 1 Device by Does not apply route as needed. 1 each 0   No facility-administered medications prior to visit.     CBC    Component Value Date/Time   WBC 8.2 03/20/2019 1619   RBC 4.20 03/20/2019 1619   HGB 11.4 (L) 03/20/2019 1619   HGB 9.8 (L) 11/29/2011 0432   HCT 37.3 03/20/2019 1619   HCT 38.7 11/13/2011 1448   PLT 187 03/20/2019 1619   PLT 119 (L) 11/29/2011 0432   MCV 88.8 03/20/2019 1619   MCV 82 11/13/2011 1448   MCH 27.1 03/20/2019 1619   MCHC 30.6 03/20/2019 1619   RDW 17.0 (H) 03/20/2019 1619   RDW 14.0 11/13/2011 1448   LYMPHSABS 0.8 03/20/2019 1619   MONOABS 0.5 03/20/2019 1619   EOSABS 0.1 03/20/2019 1619   BASOSABS 0.1 03/20/2019 1619      Current Outpatient Medications:  .  albuterol (VENTOLIN HFA) 108 (90 Base) MCG/ACT inhaler, Inhale 2 puffs into the lungs every 6 (six) hours as needed for wheezing., Disp: , Rfl:  .  apixaban (ELIQUIS) 5 MG TABS tablet, Take 5 mg by mouth 2 (two) times daily. , Disp: , Rfl:  .  cyanocobalamin 2000 MCG tablet, Take 2,000 mcg by mouth daily., Disp: , Rfl:  .  diltiazem (CARDIZEM CD) 240 MG 24 hr capsule, Take 240 mg by mouth daily after breakfast., Disp: , Rfl:  .  Fluticasone-Umeclidin-Vilant (TRELEGY ELLIPTA) 100-62.5-25 MCG/INH AEPB, Inhale 1 Inhaler into the lungs daily., Disp: 30 each, Rfl: 11 .  furosemide (LASIX) 40 MG tablet, Take 40 mg by mouth daily., Disp: , Rfl: 5 .  levothyroxine (SYNTHROID) 75 MCG tablet, TAKE 1 TABLET (75 MCG TOTAL) BY MOUTH DAILY BEFORE BREAKFAST., Disp: 90 tablet, Rfl: 1 .  OXYGEN, Inhale 2 L/L into the lungs as needed. , Disp: , Rfl:  .  potassium chloride SA (K-DUR,KLOR-CON) 20 MEQ tablet, Take 1 tablet (20 mEq total) by mouth daily. (Patient taking differently: Take 20 mEq by mouth at bedtime. ), Disp: 30 tablet, Rfl: 6 .  Spacer/Aero-Holding Dorise Bullion, 1 Device by Does not  apply route as needed., Disp: 1 each,  Rfl: 0   Lorin Glass, MD [facility] 05/24/2019 8:39 PM

## 2019-05-27 ENCOUNTER — Ambulatory Visit (INDEPENDENT_AMBULATORY_CARE_PROVIDER_SITE_OTHER): Payer: BC Managed Care – PPO | Admitting: Internal Medicine

## 2019-05-27 ENCOUNTER — Other Ambulatory Visit: Payer: Self-pay

## 2019-05-27 DIAGNOSIS — J84114 Acute interstitial pneumonitis: Secondary | ICD-10-CM

## 2019-05-27 MED ORDER — PREDNISONE 10 MG PO TABS: 10.0000 mg | ORAL_TABLET | Freq: Every day | ORAL | 1 refills | Status: DC

## 2019-06-02 ENCOUNTER — Other Ambulatory Visit: Payer: Self-pay | Admitting: Internal Medicine

## 2019-06-05 ENCOUNTER — Other Ambulatory Visit (HOSPITAL_COMMUNITY): Payer: Self-pay

## 2019-06-05 ENCOUNTER — Other Ambulatory Visit: Payer: Self-pay

## 2019-06-08 DIAGNOSIS — J189 Pneumonia, unspecified organism: Secondary | ICD-10-CM | POA: Diagnosis not present

## 2019-06-17 ENCOUNTER — Ambulatory Visit: Payer: BC Managed Care – PPO

## 2019-06-17 ENCOUNTER — Encounter: Payer: Self-pay | Admitting: Internal Medicine

## 2019-06-17 ENCOUNTER — Other Ambulatory Visit: Payer: Self-pay | Admitting: Internal Medicine

## 2019-06-17 ENCOUNTER — Other Ambulatory Visit: Payer: Self-pay

## 2019-06-17 ENCOUNTER — Ambulatory Visit (INDEPENDENT_AMBULATORY_CARE_PROVIDER_SITE_OTHER): Payer: BC Managed Care – PPO | Admitting: Internal Medicine

## 2019-06-17 ENCOUNTER — Ambulatory Visit (INDEPENDENT_AMBULATORY_CARE_PROVIDER_SITE_OTHER): Payer: BC Managed Care – PPO

## 2019-06-17 VITALS — BP 142/84 | HR 98 | Temp 97.2°F | Ht 62.0 in | Wt 362.4 lb

## 2019-06-17 DIAGNOSIS — J84114 Acute interstitial pneumonitis: Secondary | ICD-10-CM

## 2019-06-17 DIAGNOSIS — J849 Interstitial pulmonary disease, unspecified: Secondary | ICD-10-CM | POA: Diagnosis not present

## 2019-06-17 DIAGNOSIS — U071 COVID-19: Secondary | ICD-10-CM | POA: Diagnosis not present

## 2019-06-17 MED ORDER — ALBUTEROL SULFATE HFA 108 (90 BASE) MCG/ACT IN AERS: 2.0000 | INHALATION_SPRAY | Freq: Four times a day (QID) | RESPIRATORY_TRACT | 5 refills | Status: DC | PRN

## 2019-06-17 MED ORDER — PREDNISONE 5 MG PO TABS: 5.0000 mg | ORAL_TABLET | Freq: Every day | ORAL | 1 refills | Status: DC

## 2019-06-17 NOTE — Patient Instructions (Signed)
Prednisone 5mg  day x 2 months then CT chest then follow up with me. Increase activity as tolerated daily Continue CPAP

## 2019-06-17 NOTE — Progress Notes (Signed)
Visit done over phone due to COVID pandemic with patient's permission.  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   If persistent symptoms or CXR changes may warrant CTD workup but suspect low yield at this time.  PFTs reassuring against instrinsic lung disease given preserved DLCO/VA.   Overall better than last visit just taking a while to get back to baseline - Prednisone 5 mg/day x 2 more months - CT hi-res in 2 months to evaluate for any fibrotic changes to reassure me and patient about long-term sequelae - Amiodarone has pretty long half life so will take a while for this to fully resolve, this was discussed with patient - O2 at night bled into CPAP and with exertion during day - In person f/u in 2 months to review CT  # Chronic hypercapnea secondary to OSA and OHS, restrictive lung disease- confirmed with ABG 02/03/19 as well as chronic compensatory metabolic alkalosis, PFTs - Continue CPAP  # Afib- asymptomatic, on rate control   MDM . I reviewed prior external note(s) from Dr. Maisie Fus on 01/28/2019 . I reviewed the result(s) of PFTs 05/23/19 showing no obstruction, mild-moderate restriction, and preserved DLCO all related to weight . I have ordered another 2 months of prednisone and repeat CT chest  Independent interpretation of tests . Review of patient's CXR from today show improving minimally persistent interstitial changes. The patient's images have been independently reviewed by me.      Subjective:   [name] GENDER: female DOB: 59/02/1961, MRN: [id number]  Chief Complaint  Patient presents with  . 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 Continues to slowly improve on steroid taper Still using O2 in afternoon and at night To see cardiology  soon No interim hospitalizations  59 year old woman with a history of atrial fibrillation on anticoagulation presenting for recurrent admissions for "pneumonia."  Pretty clear correlation with starting amiodarone. Dyspnea, cough, congestion after starting Diagnosed with bilateral pneumonia requiring 2 admissions stopped on med, slowly getting better but not back to baseline [facility] 2 Some lingering inflammation on CXR for which she is referred to pulm  Has CPAP, using, no one monitoring download  Afib is asymptomatic for most part   ROS + symptoms in bold Fevers, chills, weight loss Nausea, vomiting, diarrhea Shortness of breath, wheezing, cough Chest pain, palpitations, lower ext edema   Objective:  GEN: no acute distress HEENT: malampatti 4 CV: RRR, ext warm PULM: Diminished at bases GI: Soft +BS EXT: No edema NEURO: ambulating PSYCH: AOx3, good insight SKIN: No rashes    Vitals:   06/17/19 1533  BP: (!) 142/84  Pulse: 98  Temp: (!) 97.2 F (36.2 C)  TempSrc: Temporal  SpO2: 95%  Weight: (!) 362 lb 6.4 oz (164.4 kg)  Height: 5\' 2"  (1.575 m)   95% on RA BMI Readings from Last 3 Encounters:  06/17/19 66.28 kg/m  04/03/19 69.16 kg/m  03/31/19 65.30 kg/m   Wt Readings from Last 3 Encounters:  06/17/19 (!) 362 lb 6.4 oz (164.4 kg)  04/03/19 (!) 361 lb 9.6 oz (164 kg)  03/31/19 (!) 357 lb (161.9 kg)     Ancillary Information    Past Medical History:  Diagnosis Date  . Abnormal mammogram, unspecified 2013  . Atrial fibrillation (HCC)  2013  . Breast screening, unspecified   . COPD (chronic obstructive pulmonary disease) (HCC)   . History of continuous positive airway pressure (CPAP) therapy at home   . Obesity, unspecified   . Screening for obesity   . Special screening for malignant neoplasms, colon   . Unspecified essential hypertension      Family History  Problem Relation Age of Onset  . Colon cancer Mother        diagnosis age 68's   . Colon cancer Sister        diagnosis age 6  . Atrial fibrillation Sister   . Breast cancer Other        Maternal Grandmother; diagnosis age 64's  . Lung cancer Other        Paternal Grandmother; diagnosis age 59's  . Colon cancer Other        Paternal Grandfather; diagnosis age 73's  . Heart disease Father      Past Surgical History:  Procedure Laterality Date  . ABDOMINAL HYSTERECTOMY  2011  . ANTERIOR CRUCIATE LIGAMENT REPAIR  2003  . BREAST BIOPSY Left January 22, 2012   left breast stereotactic biopsy showed fibroadenomatous changes with microcalcifications, fat necrosis, and sclerosing adenosis and columnar cell changes. No evidence of atypia or malignancy.  Marland Kitchen CARDIOVERSION N/A 09/22/2016   Procedure: CARDIOVERSION;  Surgeon: Dalia Heading, MD;  Location: [facility];  Service: Cardiovascular;  Laterality: N/A;  . CARDIOVERSION N/A 01/17/2018   Procedure: CARDIOVERSION;  Surgeon: Dalia Heading, MD;  Location: [facility];  Service: Cardiovascular;  Laterality: N/A;  . CARDIOVERSION N/A 01/09/2019   Procedure: CARDIOVERSION;  Surgeon: Dalia Heading, MD;  Location: [facility];  Service: Cardiovascular;  Laterality: N/A;  . CESAREAN SECTION  1985, 1987, and 1998  . COLONOSCOPY  2011   Dr. Mechele Collin  . ELECTROPHYSIOLOGIC STUDY N/A 12/03/2014   Procedure: Cardioversion;  Surgeon: Dalia Heading, MD;  Location: [facility];  Service: Cardiovascular;  Laterality: N/A;  . ELECTROPHYSIOLOGIC STUDY N/A 04/01/2015   Procedure: Cardioversion;  Surgeon: Dalia Heading, MD;  Location: [facility];  Service: Cardiovascular;  Laterality: N/A;  . KNEE SURGERY  2013    Social History   Socioeconomic History  . Marital status: Divorced    Spouse name: Not on file  . Number of children: Not on file  . Years of education: Not on file  . Highest education level: Not on file  Occupational History  . Not on file  Tobacco Use  . Smoking status: Never Smoker  . Smokeless tobacco: Never Used   Substance and Sexual Activity  . Alcohol use: No  . Drug use: No  . Sexual activity: Not on file  Other Topics Concern  . Not on file  Social History Narrative  . Not on file   Social Determinants of Health   Financial Resource Strain:   . Difficulty of Paying Living Expenses: Not on file  Food Insecurity:   . Worried About Programme researcher, broadcasting/film/video in the Last Year: Not on file  . Ran Out of Food in the Last Year: Not on file  Transportation Needs:   . Lack of Transportation (Medical): Not on file  . Lack of Transportation (Non-Medical): Not on file  Physical Activity:   . Days of Exercise per Week: Not on file  . Minutes of Exercise per Session: Not on file  Stress:   . Feeling of Stress : Not on file  Social Connections:   . Frequency  of Communication with Friends and Family: Not on file  . Frequency of Social Gatherings with Friends and Family: Not on file  . Attends Religious Services: Not on file  . Active Member of Clubs or Organizations: Not on file  . Attends Archivist Meetings: Not on file  . Marital Status: Not on file  Intimate Partner Violence:   . Fear of Current or Ex-Partner: Not on file  . Emotionally Abused: Not on file  . Physically Abused: Not on file  . Sexually Abused: Not on file     No Known Allergies   Outpatient Medications Prior to Visit  Medication Sig Dispense Refill  . albuterol (VENTOLIN HFA) 108 (90 Base) MCG/ACT inhaler Inhale 2 puffs into the lungs every 6 (six) hours as needed for wheezing.    Marland Kitchen apixaban (ELIQUIS) 5 MG TABS tablet Take 5 mg by mouth 2 (two) times daily.     . cyanocobalamin 2000 MCG tablet Take 2,000 mcg by mouth daily.    Marland Kitchen diltiazem (CARDIZEM CD) 240 MG 24 hr capsule Take 240 mg by mouth daily after breakfast.    . Fluticasone-Umeclidin-Vilant (TRELEGY ELLIPTA) 100-62.5-25 MCG/INH AEPB Inhale 1 Inhaler into the lungs daily. 30 each 11  . furosemide (LASIX) 40 MG tablet Take 40 mg by mouth daily.  5  .  levothyroxine (SYNTHROID) 75 MCG tablet TAKE 1 TABLET (75 MCG TOTAL) BY MOUTH DAILY BEFORE BREAKFAST. 90 tablet 1  . OXYGEN Inhale 2 L/L into the lungs as needed.     . potassium chloride SA (K-DUR,KLOR-CON) 20 MEQ tablet Take 1 tablet (20 mEq total) by mouth daily. (Patient taking differently: Take 20 mEq by mouth at bedtime. ) 30 tablet 6  . Spacer/Aero-Holding Chambers DEVI 1 Device by Does not apply route as needed. 1 each 0   No facility-administered medications prior to visit.     CBC    Component Value Date/Time   WBC 8.2 03/20/2019 1619   RBC 4.20 03/20/2019 1619   HGB 11.4 (L) 03/20/2019 1619   HGB 9.8 (L) 11/29/2011 0432   HCT 37.3 03/20/2019 1619   HCT 38.7 11/13/2011 1448   PLT 187 03/20/2019 1619   PLT 119 (L) 11/29/2011 0432   MCV 88.8 03/20/2019 1619   MCV 82 11/13/2011 1448   MCH 27.1 03/20/2019 1619   MCHC 30.6 03/20/2019 1619   RDW 17.0 (H) 03/20/2019 1619   RDW 14.0 11/13/2011 1448   LYMPHSABS 0.8 03/20/2019 1619   MONOABS 0.5 03/20/2019 1619   EOSABS 0.1 03/20/2019 1619   BASOSABS 0.1 03/20/2019 1619      Current Outpatient Medications:  .  albuterol (VENTOLIN HFA) 108 (90 Base) MCG/ACT inhaler, Inhale 2 puffs into the lungs every 6 (six) hours as needed for wheezing., Disp: 8 g, Rfl: 5 .  apixaban (ELIQUIS) 5 MG TABS tablet, Take 5 mg by mouth 2 (two) times daily. , Disp: , Rfl:  .  cyanocobalamin 2000 MCG tablet, Take 2,000 mcg by mouth daily., Disp: , Rfl:  .  diltiazem (CARDIZEM CD) 240 MG 24 hr capsule, Take 240 mg by mouth daily after breakfast., Disp: , Rfl:  .  furosemide (LASIX) 40 MG tablet, Take 40 mg by mouth daily., Disp: , Rfl: 5 .  levothyroxine (SYNTHROID) 75 MCG tablet, TAKE 1 TABLET (75 MCG TOTAL) BY MOUTH DAILY BEFORE BREAKFAST., Disp: 90 tablet, Rfl: 1 .  OXYGEN, Inhale 2 L/L into the lungs as needed. , Disp: , Rfl:  .  potassium chloride  SA (K-DUR,KLOR-CON) 20 MEQ tablet, Take 1 tablet (20 mEq total) by mouth daily. (Patient taking  differently: Take 20 mEq by mouth at bedtime. ), Disp: 30 tablet, Rfl: 6 .  predniSONE (DELTASONE) 10 MG tablet, Take 1 tablet (10 mg total) by mouth daily with breakfast., Disp: 30 tablet, Rfl: 1 .  Spacer/Aero-Holding Chambers DEVI, 1 Device by Does not apply route as needed., Disp: 1 each, Rfl: 0 .  TRELEGY ELLIPTA 100-62.5-25 MCG/INH AEPB, INHALE 1 INHALER INTO THE LUNGS DAILY., Disp: 180 each, Rfl: 4   Lorin Glass, MD [facility] 06/17/2019 3:54 PM

## 2019-07-09 ENCOUNTER — Emergency Department
Admission: EM | Admit: 2019-07-09 | Discharge: 2019-07-09 | Disposition: A | Discharge status: Home / Self Care | Payer: BC Managed Care – PPO | Attending: Student in an Organized Health Care Education/Training Program | Admitting: Student in an Organized Health Care Education/Training Program

## 2019-07-09 ENCOUNTER — Other Ambulatory Visit: Payer: Self-pay

## 2019-07-09 DIAGNOSIS — I10 Essential (primary) hypertension: Secondary | ICD-10-CM | POA: Insufficient documentation

## 2019-07-09 DIAGNOSIS — Z79899 Other long term (current) drug therapy: Secondary | ICD-10-CM | POA: Diagnosis not present

## 2019-07-09 DIAGNOSIS — E039 Hypothyroidism, unspecified: Secondary | ICD-10-CM | POA: Diagnosis not present

## 2019-07-09 DIAGNOSIS — R040 Epistaxis: Secondary | ICD-10-CM | POA: Insufficient documentation

## 2019-07-09 DIAGNOSIS — Z7901 Long term (current) use of anticoagulants: Secondary | ICD-10-CM | POA: Insufficient documentation

## 2019-07-09 DIAGNOSIS — J189 Pneumonia, unspecified organism: Secondary | ICD-10-CM | POA: Diagnosis not present

## 2019-07-09 DIAGNOSIS — J449 Chronic obstructive pulmonary disease, unspecified: Secondary | ICD-10-CM | POA: Diagnosis not present

## 2019-07-09 LAB — CBC
HCT: 42.9 % (ref 36.0–46.0)
Hemoglobin: 13.2 g/dL (ref 12.0–15.0)
MCH: 28 pg (ref 26.0–34.0)
MCHC: 30.8 g/dL (ref 30.0–36.0)
MCV: 91.1 fL (ref 80.0–100.0)
Platelets: 181 10*3/uL (ref 150–400)
RBC: 4.71 MIL/uL (ref 3.87–5.11)
RDW: 15 % (ref 11.5–15.5)
WBC: 10.4 10*3/uL (ref 4.0–10.5)
nRBC: 0 % (ref 0.0–0.2)

## 2019-07-09 MED ORDER — SILVER NITRATE-POT NITRATE 75-25 % EX MISC
1.0000 | Freq: Once | CUTANEOUS | Status: AC
  Administered 2019-07-09: 1 via TOPICAL
  Filled 2019-07-09: qty 10

## 2019-07-09 MED ORDER — AMLODIPINE BESYLATE 5 MG PO TABS
5.0000 mg | ORAL_TABLET | Freq: Once | ORAL | Status: AC
  Administered 2019-07-09: 5 mg via ORAL
  Filled 2019-07-09: qty 1

## 2019-07-09 MED ORDER — OXYMETAZOLINE HCL 0.05 % NA SOLN
1.0000 | Freq: Once | NASAL | Status: AC
  Administered 2019-07-09: 17:00:00 1 via NASAL
  Filled 2019-07-09: qty 30

## 2019-07-09 MED ORDER — AMLODIPINE BESYLATE 5 MG PO TABS: 5.0000 mg | ORAL_TABLET | Freq: Every day | ORAL | 0 refills | Status: DC

## 2019-07-09 MED ORDER — AMOXICILLIN-POT CLAVULANATE 875-125 MG PO TABS: 1.0000 | ORAL_TABLET | Freq: Two times a day (BID) | ORAL | 0 refills | Status: AC

## 2019-07-09 MED ORDER — LIDOCAINE-EPINEPHRINE 2 %-1:100000 IJ SOLN
30.0000 mL | Freq: Once | INTRAMUSCULAR | Status: AC
  Administered 2019-07-09: 30 mL
  Filled 2019-07-09: qty 2

## 2019-07-09 MED ORDER — PROBIOTIC 250 MG PO CAPS: 1.0000 | ORAL_CAPSULE | Freq: Two times a day (BID) | ORAL | 0 refills | Status: AC

## 2019-07-09 MED ORDER — TRANEXAMIC ACID FOR EPISTAXIS
500.0000 mg | Freq: Once | TOPICAL | Status: AC
  Administered 2019-07-09: 500 mg via TOPICAL
  Filled 2019-07-09 (×2): qty 10

## 2019-07-09 NOTE — ED Triage Notes (Signed)
Bleeding nose since 1430 today. Taken Eliquis. Has nose stuffed with tissue currently.

## 2019-07-09 NOTE — ED Notes (Signed)
Nose clamp applied

## 2019-07-09 NOTE — ED Notes (Signed)
Medication verified by pharmacy. Awaiting on medication from pharmacy.

## 2019-07-09 NOTE — ED Notes (Signed)
Med sent from pharmacy and at bedside. EDP Roxan Hockey notified.

## 2019-07-09 NOTE — ED Notes (Signed)
This RN messaged pharmacy for med verification.  

## 2019-07-09 NOTE — ED Provider Notes (Signed)
[facility] Emergency Department Provider Note    First MD Initiated Contact with Patient 07/09/19 1705     (approximate)  I have reviewed the triage vital signs and the nursing notes.   HISTORY  Chief Complaint Epistaxis    HPI Katherine Scott is a 59 y.o. female with a history of A. fib on Eliquis presents to the ER for epistaxis that started today.  States that she has had episodes of epistaxis previously has required cauterization and packing in the past.  Has not had to be admitted for this in the past.  Denies any pain.  Dates that she frequently gets at this time of year secondary to allergies.    Past Medical History:  Diagnosis Date  . Abnormal mammogram, unspecified 2013  . Atrial fibrillation ([place])    2013  . Breast screening, unspecified   . COPD (chronic obstructive pulmonary disease) ([place])   . History of continuous positive airway pressure (CPAP) therapy at home   . Obesity, unspecified   . Screening for obesity   . Special screening for malignant neoplasms, colon   . Unspecified essential hypertension    Family History  Problem Relation Age of Onset  . Colon cancer Mother        diagnosis age 74's  . Colon cancer Sister        diagnosis age 49  . Atrial fibrillation Sister   . Breast cancer Other        Maternal Grandmother; diagnosis age 52's  . Lung cancer Other        Paternal Grandmother; diagnosis age 33's  . Colon cancer Other        Paternal Grandfather; diagnosis age 42's  . Heart disease Father    Past Surgical History:  Procedure Laterality Date  . ABDOMINAL HYSTERECTOMY  2011  . ANTERIOR CRUCIATE LIGAMENT REPAIR  2003  . BREAST BIOPSY Left January 22, 2012   left breast stereotactic biopsy showed fibroadenomatous changes with microcalcifications, fat necrosis, and sclerosing adenosis and columnar cell changes. No evidence of atypia or malignancy.  Marland Kitchen CARDIOVERSION N/A 09/22/2016   Procedure: CARDIOVERSION;  Surgeon:  Teodoro Spray, MD;  Location: [facility];  Service: Cardiovascular;  Laterality: N/A;  . CARDIOVERSION N/A 01/17/2018   Procedure: CARDIOVERSION;  Surgeon: Teodoro Spray, MD;  Location: [facility];  Service: Cardiovascular;  Laterality: N/A;  . CARDIOVERSION N/A 01/09/2019   Procedure: CARDIOVERSION;  Surgeon: Teodoro Spray, MD;  Location: [facility];  Service: Cardiovascular;  Laterality: N/A;  . [place]  . COLONOSCOPY  2011   Dr. Vira Agar  . ELECTROPHYSIOLOGIC STUDY N/A 12/03/2014   Procedure: Cardioversion;  Surgeon: Teodoro Spray, MD;  Location: [facility];  Service: Cardiovascular;  Laterality: N/A;  . ELECTROPHYSIOLOGIC STUDY N/A 04/01/2015   Procedure: Cardioversion;  Surgeon: Teodoro Spray, MD;  Location: [facility];  Service: Cardiovascular;  Laterality: N/A;  . KNEE SURGERY  2013   Patient Active Problem List   Diagnosis Date Noted  . Pneumonia 03/20/2019  . Acute respiratory failure with hypoxia ([place]) 03/20/2019  . Bilateral pneumonia 02/03/2019  . Lymphedema 02/26/2017  . 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 Acquired hypothyroidism 08/11/2016  . BMI 60.0-69.9, adult ([place]) 08/11/2016  . Paroxysmal atrial fibrillation ([place]) 02/18/2015      Prior to Admission medications   Medication Sig Start Date End Date Taking? Authorizing Provider  albuterol (VENTOLIN HFA) 108 (90 Base) MCG/ACT inhaler  Inhale 2 puffs into the lungs every 6 (six) hours as needed for wheezing. 06/17/19   Lorin Glass, MD  amLODipine (NORVASC) 5 MG tablet Take 1 tablet (5 mg total) by mouth daily. 07/09/19 07/08/20  Willy Eddy, MD  amoxicillin-clavulanate (AUGMENTIN) 875-125 MG tablet Take 1 tablet by mouth 2 (two) times daily for 7 days. 07/09/19 07/16/19  Willy Eddy, MD  apixaban (ELIQUIS) 5 MG TABS tablet Take 5 mg by mouth 2 (two) times daily.  10/31/17   [provider]  cyanocobalamin 2000 MCG tablet Take 2,000 mcg by mouth daily.     [provider]  diltiazem (CARDIZEM CD) 240 MG 24 hr capsule Take 240 mg by mouth daily after breakfast.    [provider]  furosemide (LASIX) 40 MG tablet Take 40 mg by mouth daily. 11/01/17   [provider]  levothyroxine (SYNTHROID) 75 MCG tablet TAKE 1 TABLET (75 MCG TOTAL) BY MOUTH DAILY BEFORE BREAKFAST. 05/12/19   Karamalegos, Netta Neat, DO  OXYGEN Inhale 2 L/L into the lungs as needed.     [provider]  potassium chloride SA (K-DUR,KLOR-CON) 20 MEQ tablet Take 1 tablet (20 mEq total) by mouth daily. Patient taking differently: Take 20 mEq by mouth at bedtime.  02/19/15   Dalia Heading, MD  predniSONE (DELTASONE) 5 MG tablet Take 1 tablet (5 mg total) by mouth daily with breakfast. 06/17/19   Lorin Glass, MD  Saccharomyces boulardii (PROBIOTIC) 250 MG CAPS Take 1 capsule by mouth 2 (two) times daily for 7 days. 07/09/19 07/16/19  Willy Eddy, MD  Spacer/Aero-Holding Deretha Emory DEVI 1 Device by Does not apply route as needed. 05/24/18   Galen Manila, NP  TRELEGY ELLIPTA 100-62.5-25 MCG/INH AEPB INHALE 1 INHALER INTO THE LUNGS DAILY. 06/04/19   Lorin Glass, MD    Allergies Amiodarone    Social History Social History   Tobacco Use  . Smoking status: Never Smoker  . Smokeless tobacco: Never Used  Substance Use Topics  . Alcohol use: No  . Drug use: No    Review of Systems Patient denies headaches, rhinorrhea, blurry vision, numbness, shortness of breath, chest pain, edema, cough, abdominal pain, nausea, vomiting, diarrhea, dysuria, fevers, rashes or hallucinations unless otherwise stated above in HPI. ____________________________________________   PHYSICAL EXAM:  VITAL SIGNS: Vitals:   07/09/19 2000 07/09/19 2010  BP: (!) 165/94 (!) 158/85  Pulse: 77 85  Resp:    Temp:    SpO2:      Constitutional: Alert and oriented.  Eyes: Conjunctivae are normal.  Head: Atraumatic. Nose: Evidence of bleeding small polyp in  the anterior left nasal septum.  No mass.  No evidence of posterior hemorrhage. Mouth/Throat: Mucous membranes are moist.   Neck: No stridor. Painless ROM.  Cardiovascular: Normal rate, regular rhythm. Grossly normal heart sounds.  Good peripheral circulation. Respiratory: Normal respiratory effort.  No retractions. Lungs CTAB. Gastrointestinal: Soft and nontender. No distention. No abdominal bruits. No CVA tenderness. Genitourinary:  Musculoskeletal: No lower extremity tenderness nor edema.  No joint effusions. Neurologic:  Normal speech and language. No gross focal neurologic deficits are appreciated. No facial droop Skin:  Skin is warm, dry and intact. No rash noted. Psychiatric: Mood and affect are normal. Speech and behavior are normal.  ____________________________________________   LABS (all labs ordered are listed, but only abnormal results are displayed)  Results for orders placed or performed during the hospital encounter of 07/09/19 (from the past 24 hour(s))  CBC  Status: None   Collection Time: 07/09/19  4:58 PM  Result Value Ref Range   WBC 10.4 4.0 - 10.5 K/uL   RBC 4.71 3.87 - 5.11 MIL/uL   Hemoglobin 13.2 12.0 - 15.0 g/dL   HCT 21.1 15.5 - 20.8 %   MCV 91.1 80.0 - 100.0 fL   MCH 28.0 26.0 - 34.0 pg   MCHC 30.8 30.0 - 36.0 g/dL   RDW 02.2 33.6 - 12.2 %   Platelets 181 150 - 400 K/uL   nRBC 0.0 0.0 - 0.2 %   ____________________________________________ ____________________________________________  RADIOLOGY   ____________________________________________   PROCEDURES  Procedure(s) performed:  .Epistaxis Management  Date/Time: 07/09/2019 5:58 PM Performed by: Willy Eddy, MD Authorized by: Willy Eddy, MD   Consent:    Consent obtained:  Verbal Procedure details:    Treatment site:  L anterior   Treatment method:  Silver nitrate and merocel sponge   Treatment episode: initial   Post-procedure details:    Assessment:  Bleeding  stopped   Patient tolerance of procedure:  Tolerated well, no immediate complications      Critical Care performed: no ____________________________________________   INITIAL IMPRESSION / ASSESSMENT AND PLAN / ED COURSE  Pertinent labs & imaging results that were available during my care of the patient were reviewed by me and considered in my medical decision making (see chart for details).   DDX: epistaxis, trauma, acute blood loss anemia  Katherine Scott is a 59 y.o. who presents to the ED with evidence of left anterior epistaxis.  Presentation complicated as she is on Eliquis.  She is hemodynamically stable mildly hypertensive.  Denies any chest pain or shortness of breath.  Hemostasis control obtained as described above.  Clinical Course as of Jul 08 2016  Wed Jul 09, 2019  1842 Patient still having some very minimal issues I did add some TXA to the Merocel sponge will observe.   [PR]  1929 Bleeding has stopped.  Remains well-appearing in no acute distress.   [PR]    Clinical Course User Index [PR] Willy Eddy, MD   Patient's blood pressure did improve with Norvasc.  I am prescribing a short course of this visit I instructed the patient to take if blood pressure greater than 150 to help control bleeding.  She has not previously needed this therefore this may be related to stress being in the ER but patient demonstrates understanding of plan. Have discussed with the patient and available family all diagnostics and treatments performed thus far and all questions were answered to the best of my ability. The patient demonstrates understanding and agreement with plan.   The patient was evaluated in Emergency Department today for the symptoms described in the history of present illness. He/she was evaluated in the context of the global COVID-19 pandemic, which necessitated consideration that the patient might be at risk for infection with the SARS-CoV-2 virus that causes COVID-19.  Institutional protocols and algorithms that pertain to the evaluation of patients at risk for COVID-19 are in a state of rapid change based on information released by regulatory bodies including the CDC and federal and state organizations. These policies and algorithms were followed during the patient's care in the ED.  As part of my medical decision making, I reviewed the following data within the electronic MEDICAL RECORD NUMBER Nursing notes reviewed and incorporated, Labs reviewed, notes from prior ED visits and [place] Controlled Substance Database   ____________________________________________   FINAL CLINICAL IMPRESSION(S) / ED DIAGNOSES  Final diagnoses:  Anterior epistaxis  Hypertension, unspecified type      NEW MEDICATIONS STARTED DURING THIS VISIT:  New Prescriptions   AMLODIPINE (NORVASC) 5 MG TABLET    Take 1 tablet (5 mg total) by mouth daily.   AMOXICILLIN-CLAVULANATE (AUGMENTIN) 875-125 MG TABLET    Take 1 tablet by mouth 2 (two) times daily for 7 days.   SACCHAROMYCES BOULARDII (PROBIOTIC) 250 MG CAPS    Take 1 capsule by mouth 2 (two) times daily for 7 days.     Note:  This document was prepared using Dragon voice recognition software and may include unintentional dictation errors.    Willy Eddy, MD 07/09/19 2018

## 2019-07-15 DIAGNOSIS — I10 Essential (primary) hypertension: Secondary | ICD-10-CM | POA: Diagnosis not present

## 2019-07-15 DIAGNOSIS — R040 Epistaxis: Secondary | ICD-10-CM | POA: Diagnosis not present

## 2019-07-16 ENCOUNTER — Other Ambulatory Visit: Payer: Self-pay | Admitting: Internal Medicine

## 2019-08-06 ENCOUNTER — Encounter: Payer: Self-pay | Admitting: Family Medicine

## 2019-08-06 ENCOUNTER — Ambulatory Visit (INDEPENDENT_AMBULATORY_CARE_PROVIDER_SITE_OTHER): Payer: BC Managed Care – PPO | Admitting: Family Medicine

## 2019-08-06 ENCOUNTER — Other Ambulatory Visit: Payer: Self-pay

## 2019-08-06 VITALS — BP 133/67 | HR 84 | Temp 97.3°F | Resp 16 | Ht 62.0 in | Wt 365.5 lb

## 2019-08-06 DIAGNOSIS — I872 Venous insufficiency (chronic) (peripheral): Secondary | ICD-10-CM | POA: Diagnosis not present

## 2019-08-06 DIAGNOSIS — L03119 Cellulitis of unspecified part of limb: Secondary | ICD-10-CM

## 2019-08-06 DIAGNOSIS — I890 Lymphedema, not elsewhere classified: Secondary | ICD-10-CM

## 2019-08-06 MED ORDER — DOXYCYCLINE HYCLATE 100 MG PO TABS: 100.0000 mg | ORAL_TABLET | Freq: Two times a day (BID) | ORAL | 0 refills | Status: DC

## 2019-08-06 MED ORDER — CEPHALEXIN 500 MG PO CAPS: 500.0000 mg | ORAL_CAPSULE | Freq: Three times a day (TID) | ORAL | 0 refills | Status: DC

## 2019-08-06 NOTE — Progress Notes (Signed)
Subjective:    [name], female    DOB: 11/30/1960, 59 y.o.   MRN: [id number]  Katherine Scott is a 59 y.o. female presenting on 08/06/2019 for Foot Swelling (onset week) and Cellulitis (onset week)  Patient presents for a same day appointment.  HPI    BILATERAL LOWER EXTREMITY CELLULITIS / Stasis Dermatitis / Lymphedema Background history review: Has history of Lymphedema in lower extremities, she was treated last by [facility] back in 02/2017, was treated at that time for recurrent cellulitis and her PCP, had ultrasound of veins done that was negative, and treated with multiple antibiotics previously.  Recent history. Last seen for same problem by me 08/2018, she had recurrent cellulitis stasis dermatitis with lymphedema R>L at that time. She was previously treated week prior in our office for LLE cellulitis, failed doxycycline monotherapy. She was started on Keflex + Doxycycline repeat course and improved diuretic and she improved.  Today reports now recurrence of same problem. She is familiar with this pattern, knew it was worsening but tried to manage it at home. Seems swelling no longer improving. Now has worse L>R swelling lower legs, compression is not helping as much any more. Worse in summer with warmer temperature - Taking Furosemide 40mg  daily still, not increased dose. - Admits inc pain and redness associated with swelling now Admits dyspnea followed by Pulmonology on prednisone and has upcoming scan. Denies any fevers chills spreading redness, diarrhea, nausea vomiting   Depression screen [facility] 2/9 08/06/2019 03/31/2019 02/14/2019  Decreased Interest 0 0 0  Down, Depressed, Hopeless 0 0 0  PHQ - 2 Score 0 0 0  Altered sleeping - - -  Tired, decreased energy - - -  Change in appetite - - -  Feeling bad or failure about yourself  - - -  Trouble concentrating - - -  Moving slowly or fidgety/restless - - -  Suicidal thoughts - - -  PHQ-9 Score - - -      Social History   Tobacco Use  . Smoking status: Never Smoker  . Smokeless tobacco: Never Used  Substance Use Topics  . Alcohol use: No  . Drug use: No    Review of Systems Per HPI unless specifically indicated above     Objective:    BP 133/67   Pulse 84   Temp (!) 97.3 F (36.3 C) (Temporal)   Resp 16   Ht 5\' 2"  (1.575 m)   Wt (!) 365 lb 8 oz (165.8 kg)   SpO2 94%   BMI 66.85 kg/m   Wt Readings from Last 3 Encounters:  08/06/19 (!) 365 lb 8 oz (165.8 kg)  07/09/19 (!) 360 lb (163.3 kg)  06/17/19 (!) 362 lb 6.4 oz (164.4 kg)    Physical Exam Vitals and nursing note reviewed.  Constitutional:      General: She is not in acute distress.    Appearance: She is well-developed. She is obese. She is not diaphoretic.     Comments: Well-appearing, comfortable, cooperative  HENT:     Head: Normocephalic and atraumatic.  Eyes:     General:        Right eye: No discharge.        Left eye: No discharge.     Conjunctiva/sclera: Conjunctivae normal.  Cardiovascular:     Rate and Rhythm: Normal rate.  Pulmonary:     Breath sounds: No wheezing or rales.  Musculoskeletal:     Right lower  leg: Edema (3+) present.     Left lower leg: Edema (3+) present.  Skin:    General: Skin is warm and dry.     Findings: Erythema (bilateral lower extremity ankle to mid shin with increased erythema, warmth, circumferential around lower extremities symmetrical, no open ulceration, no weeping, mild tender, tense edema) and rash present.  Neurological:     Mental Status: She is alert and oriented to person, place, and time.  Psychiatric:        Behavior: Behavior normal.     Comments: Well groomed, good eye contact, normal speech and thoughts    Results for orders placed or performed during the hospital encounter of 07/09/19  CBC  Result Value Ref Range   WBC 10.4 4.0 - 10.5 K/uL   RBC 4.71 3.87 - 5.11 MIL/uL   Hemoglobin 13.2 12.0 - 15.0 g/dL   HCT 79.8 92.1 - 19.4 %   MCV 91.1  80.0 - 100.0 fL   MCH 28.0 26.0 - 34.0 pg   MCHC 30.8 30.0 - 36.0 g/dL   RDW 17.4 08.1 - 44.8 %   Platelets 181 150 - 400 K/uL   nRBC 0.0 0.0 - 0.2 %      Assessment & Plan:   Problem List Items Addressed This Visit    Recurrent cellulitis of lower extremity - Primary   Relevant Medications   cephALEXin (KEFLEX) 500 MG capsule   doxycycline (VIBRA-TABS) 100 MG tablet   Lymphedema    Other Visit Diagnoses    Stasis dermatitis of both legs          Clinically with bilateral symmetrical worsening over days to week+ cellulitis / lymphedema vs stasis dermatitis  Similar history of prior recurrent cellulitis and lymphedema. Last flare 2020 Previously followed by [facility], last 2018 has not returned  Plan - Advised based on her prior treatment course we can treat similarly at this time given severity of this condition, will offer dual antibiotic coverage - Start taking Doxycycline antibiotic 100mg  twice daily for 10 days. Take with full glass of water and stay upright for at least 30 min after taking, may be seated or standing, but should NOT lay down. This is just a safety precaution, if this medicine does not go all the way down throat well it could cause some burning discomfort to throat and esophagus. - Start Keflex 500mg  TID x 7 days - Use compression and RICE therapy for edema /lymphedema - Increase Furosemide from 40mg  daily to 40 BID for 3-5 days PRN then reduce, can return for chemistry panel if taking too often. - Follow-up with Vascular when ready or if needed for next steps for lymphedema or may warrant Unna boot wrap if infection unresolved.  Lastly advised if cellulitis / lymphedema remains, she may need to seek care at hospital due to oral antibiotic may not be effective for too much swelling  Meds ordered this encounter  Medications  . cephALEXin (KEFLEX) 500 MG capsule    Sig: Take 1 capsule (500 mg total) by mouth 3 (three) times daily. For 7 days      Dispense:  21 capsule    Refill:  0  . doxycycline (VIBRA-TABS) 100 MG tablet    Sig: Take 1 tablet (100 mg total) by mouth 2 (two) times daily. For 10 days. Take with full glass of water, stay upright 30 min after taking.    Dispense:  20 tablet    Refill:  0  Follow up plan: Return in about 2 weeks (around 08/20/2019), or if symptoms worsen or fail to improve, for swelling leg cellulitis.    Saralyn Pilar, DO [facility] 08/06/2019, 3:43 PM

## 2019-08-06 NOTE — Patient Instructions (Addendum)
Thank you for coming to the office today.  Start taking Doxycycline antibiotic 100mg  twice daily for 10 days. Take with full glass of water and stay upright for at least 30 min after taking, may be seated or standing, but should NOT lay down. This is just a safety precaution, if this medicine does not go all the way down throat well it could cause some burning discomfort to throat and esophagus.  Start Keflex antibiotic 3 times a day for 7 days.  INCREASE furosemide from 40mg  once daily, now to take an extra pill in early afternoon for 3-5 days. Can repeat if need.  Call [facility] if not improving as expected within 1-2 weeks.  [facility] [address] [place], [address] [place]  Main: [phone]   ------------------------------  Use RICE therapy: - R - Rest / relative rest with activity modification avoid overuse of joint - I - Ice packs (make sure you use a towel or sock / something to protect skin) - C - Compression with ACE wrap to apply pressure and reduce swelling allowing more support - E - Elevation - if significant swelling, lift leg above heart level (toes above your nose) to help reduce swelling, most helpful at night after day of being on your feet    Please schedule a Follow-up Appointment to: Return in about 2 weeks (around 08/20/2019), or if symptoms worsen or fail to improve, for swelling leg cellulitis.  If you have any other questions or concerns, please feel free to call the office or send a message through MyChart. You may also schedule an earlier appointment if necessary.  Additionally, you may be receiving a survey about your experience at our office within a few days to 1 week by e-mail or mail. We value your feedback.  [phone], DO [facility], Saralyn Pilar

## 2019-08-08 DIAGNOSIS — J189 Pneumonia, unspecified organism: Secondary | ICD-10-CM | POA: Diagnosis not present

## 2019-08-12 ENCOUNTER — Other Ambulatory Visit: Payer: Self-pay | Admitting: Internal Medicine

## 2019-08-13 ENCOUNTER — Ambulatory Visit: Payer: Self-pay | Admitting: *Deleted

## 2019-08-13 NOTE — Telephone Encounter (Signed)
Acknowledged. Agree  Saralyn Pilar, DO [facility] 08/13/2019, 10:21 AM

## 2019-08-13 NOTE — Telephone Encounter (Signed)
Pt called in c/o having chest pain that is radiating around into her upper back.  She is also having pain with deep breathing and coughing.  She is not coughing up anything.  She is in A. Fib. Constantly. Last year she had a bad allergic reaction to a drug the cardiologist  put her on that damaged her lungs.   She is for a CT scan Tuesday to see how much if any damage there is to her lungs from the reaction.    I instructed her to go to the ED.  She was agreeable but wanted to wait and go this afternoon.  "I need to take care of some things at work first".    I let her know that was not a good idea.   If there is something going on with her heart or lungs then she needs to have it evaluated as soon as possible to prevent further damage.   She agreed to go on to [facility]. I let her know I would let Dr. Althea Charon know of the ED referral.    She thanked me for my help and advice.  I sent my notes to Dr. Althea Charon.   Reason for Disposition . [1] Chest pain lasts > 5 minutes AND [2] age > 45    Has A.Fib.   Last year had a bad allergic reaction to a drug the cardiologist put her on that did damage to her lungs.  Answer Assessment - Initial Assessment Questions 1. 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   It's fine now.    Saturday/Sunday I started having pain in my chest when I take a deep breath and across my back and with coughing. 2. RADIATION: "Does the pain go anywhere else?" (e.g., into neck, jaw, arms, back)     Upper back intermittently.    3. ONSET: "When did the chest pain begin?" (Minutes, hours or days)      Saturday or Sunday.  I thought it was allergies at first. 4. PATTERN "Does the pain come and go, or has it been constant since it started?"  "Does it get worse with exertion?"      intermittent 5. DURATION: "How long does it last" (e.g., seconds, minutes, hours)     Very sporatic 6.  SEVERITY: "How bad is the pain?"  (e.g., Scale 1-10; mild, moderate, or severe)    - MILD (1-3): doesn't interfere with normal activities     - MODERATE (4-7): interferes with normal activities or awakens from sleep    - SEVERE (8-10): excruciating pain, unable to do any normal activities       Pain worse when I cough.   Not coughing up anything.  No COVID exposures that she knows of.    I've had first vaccine last Friday the [employer]. 7. CARDIAC RISK FACTORS: "Do you have any history of heart problems or risk factors for heart disease?" (e.g., angina, prior heart attack; diabetes, high blood pressure, high cholesterol, smoker, or strong family history of heart disease)     I have A. Fib.   Constantly.   8. PULMONARY RISK FACTORS: "Do you have any history of lung disease?"  (e.g., blood clots in lung, asthma, emphysema, birth control pills)     See above but otherwise no.  No blood clot history in legs or lungs. 9. CAUSE: "What do  you think is causing the chest pain?"     I don't know.   At first maybe indigestion or allergies. 10. OTHER SYMPTOMS: "Do you have any other symptoms?" (e.g., dizziness, nausea, vomiting, sweating, fever, difficulty breathing, cough)       No dizziness or sweating, no nausea, no diarrhea, no fever.   I am short of breath but that's normal for me with the A. FIb. 11. PREGNANCY: "Is there any chance you are pregnant?" "When was your last menstrual period?"       N/A due to age  Protocols used: CHEST PAIN-A-AH

## 2019-08-19 ENCOUNTER — Other Ambulatory Visit: Payer: Self-pay

## 2019-08-19 ENCOUNTER — Ambulatory Visit
Admission: RE | Admit: 2019-08-19 | Discharge: 2019-08-19 | Disposition: A | Discharge status: Home / Self Care | Payer: BC Managed Care – PPO | Source: Ambulatory Visit | Attending: Internal Medicine | Admitting: Internal Medicine

## 2019-08-19 DIAGNOSIS — J849 Interstitial pulmonary disease, unspecified: Secondary | ICD-10-CM | POA: Diagnosis not present

## 2019-08-19 DIAGNOSIS — I517 Cardiomegaly: Secondary | ICD-10-CM | POA: Diagnosis not present

## 2019-08-19 DIAGNOSIS — R918 Other nonspecific abnormal finding of lung field: Secondary | ICD-10-CM | POA: Diagnosis not present

## 2019-09-05 ENCOUNTER — Telehealth: Payer: Self-pay | Admitting: Family Medicine

## 2019-09-05 DIAGNOSIS — L03119 Cellulitis of unspecified part of limb: Secondary | ICD-10-CM

## 2019-09-05 MED ORDER — CEPHALEXIN 500 MG PO CAPS: 500.0000 mg | ORAL_CAPSULE | Freq: Three times a day (TID) | ORAL | 0 refills | Status: DC

## 2019-09-05 MED ORDER — DOXYCYCLINE HYCLATE 100 MG PO TABS: 100.0000 mg | ORAL_TABLET | Freq: Two times a day (BID) | ORAL | 0 refills | Status: DC

## 2019-09-05 NOTE — Telephone Encounter (Signed)
Last visit with me 08/06/19. Same problem. Treated with doxy and keflex. Explicit instructions given to her that if not improving may need hospital ED for IV antibiotics also strongly advised to call and return to Vascular, may warrant unna boot and other therapy.  Now 1 month later, it had improved temporarily, now returned. No appointments today received message after 4pm today.  I called her back. Similar situation to last visit. I will agree to re order the same antibiotics keflex / doxycycline, last time required 2 rounds.  Same instructions as before. If not improving going into long weekend, needs to go to hospital ED for IV antibiotics and other therapy. ALSO needs to call vascular next week to follow-up further management. There is not much else I have to offer. Especially at this time after hours. She understands the follow-up plan if not improving and agrees to follow through with it.  Katherine Pilar, DO [facility] 09/05/2019, 5:16 PM

## 2019-09-05 NOTE — Telephone Encounter (Signed)
Patient was seen on 08/06/19 for Cellulitis. She is requesting a refill of doxycycline (VIBRA-TABS) 100 MG tablet [[id number]] & cephALEXin (KEFLEX) 500 MG capsule [[id number]]. Patient's cellulitis  has not improved. In the past, it has taken two rounds of an antibotic to clear it up. Patient's CB- [phone]  Preferred Pharmacy [id number] - [place], [place] - [address]  Phone:  [phone] Fax:  [phone]

## 2019-09-08 DIAGNOSIS — J189 Pneumonia, unspecified organism: Secondary | ICD-10-CM | POA: Diagnosis not present

## 2019-09-22 ENCOUNTER — Telehealth: Payer: Self-pay

## 2019-09-22 DIAGNOSIS — I872 Venous insufficiency (chronic) (peripheral): Secondary | ICD-10-CM

## 2019-09-22 DIAGNOSIS — L03119 Cellulitis of unspecified part of limb: Secondary | ICD-10-CM

## 2019-09-22 DIAGNOSIS — I890 Lymphedema, not elsewhere classified: Secondary | ICD-10-CM

## 2019-09-22 NOTE — Telephone Encounter (Signed)
Placed referral for her to return to [facility].  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 Treated by PCP multiple times in past few months, last 08/06/19, previously seen by [facility] 2018 for same problem, has had recurrences. Now limited improvement on diuretic and antibiotics and compression, request return to Vascular   Has the referral been discussed with the patient?: Yes   Designated contact for the referral if not the patient (name/phone number): patient   Has the patient seen a specialist for this issue before?: Yes  If so, who (practice/provider)? Cleda Daub PA - [facility] 2018   Does the patient have a provider or location preference for the referral?: Yes [facility]  Would the patient like to see previous specialist if applicable?  Saralyn Pilar, DO [facility] 09/22/2019, 1:35 PM

## 2019-09-22 NOTE — Telephone Encounter (Signed)
Copied from CRM [id number]. Topic: Referral - Request for Referral >> Sep 22, 2019  9:19 AM Reggie Pile, NT wrote: Has patient seen PCP for this complaint? Yes  *If NO, is insurance requiring patient see PCP for this issue before PCP can refer them? Referral for which specialty: vein specialist Preferred provider/office: [facility]  Reason for referral: issue not clearing up

## 2019-09-26 ENCOUNTER — Telehealth: Payer: Self-pay | Admitting: Internal Medicine

## 2019-09-26 NOTE — Telephone Encounter (Signed)
Called and spoke with pt. Pt stated that she had a HRCT performed 5/11 and has not heard about the results. Dr. Katrinka Blazing, please advise.

## 2019-09-29 NOTE — Telephone Encounter (Signed)
Called and updated. 

## 2019-09-29 NOTE — Telephone Encounter (Signed)
Dr. Katrinka Blazing please advise on results for this patient

## 2019-09-29 NOTE — Telephone Encounter (Signed)
Thank you :)

## 2019-09-30 ENCOUNTER — Telehealth: Payer: Self-pay

## 2019-09-30 NOTE — Telephone Encounter (Signed)
I am not aware of any other vascular specialist in the area.  There is an office in [place]. [facility])  We can switch to them if preferred.  The referral was ordered on 09/22/19.  I am not sure if the existing referral can be changed since it is still within [facility], or if new order is needed. Should be able to use existing referral to change from [place] to [place].  Saralyn Pilar, DO [facility] 09/30/2019, 5:01 PM

## 2019-09-30 NOTE — Telephone Encounter (Signed)
Copied from CRM [id number]. Topic: General - Other >> Sep 30, 2019  4:17 PM Gwenlyn Fudge wrote: Reason for CRM: PT called stating that she is having trouble getting an appt through [facility]. PT is requesting to have a referral to somewhere else if possible. Please advise.

## 2019-10-01 NOTE — Telephone Encounter (Signed)
Spoke to the patient and apt is scheduled for tomorrow around 10:45 am with [facility].

## 2019-10-02 ENCOUNTER — Ambulatory Visit (INDEPENDENT_AMBULATORY_CARE_PROVIDER_SITE_OTHER): Payer: BC Managed Care – PPO | Admitting: Nurse Practitioner

## 2019-10-02 ENCOUNTER — Other Ambulatory Visit: Payer: Self-pay

## 2019-10-02 ENCOUNTER — Encounter (INDEPENDENT_AMBULATORY_CARE_PROVIDER_SITE_OTHER): Payer: Self-pay | Admitting: Vascular Surgery

## 2019-10-02 VITALS — BP 175/113 | Resp 18 | Ht 62.0 in | Wt 381.0 lb

## 2019-10-02 DIAGNOSIS — L03119 Cellulitis of unspecified part of limb: Secondary | ICD-10-CM

## 2019-10-02 DIAGNOSIS — I890 Lymphedema, not elsewhere classified: Secondary | ICD-10-CM | POA: Diagnosis not present

## 2019-10-02 MED ORDER — DOXYCYCLINE HYCLATE 100 MG PO TABS: 100.0000 mg | ORAL_TABLET | Freq: Two times a day (BID) | ORAL | 0 refills | Status: DC

## 2019-10-02 MED ORDER — AMOXICILLIN 875 MG PO TABS: 875.0000 mg | ORAL_TABLET | Freq: Two times a day (BID) | ORAL | 0 refills | Status: DC

## 2019-10-06 ENCOUNTER — Encounter (INDEPENDENT_AMBULATORY_CARE_PROVIDER_SITE_OTHER): Payer: Self-pay | Admitting: Nurse Practitioner

## 2019-10-06 NOTE — Progress Notes (Signed)
Subjective:    [name], female    DOB: 12-30-1960, 59 y.o.   MRN: [id number] Chief Complaint  Patient presents with  . New Patient (Initial Visit)    lymphedema and cellulitis    The patient returns today as a new patient from Dr. Althea Charon.  The patient has had a previous history of lymphedema that was previously well controlled however recently she began having recurrent cellulitis.  The patient does endorse wearing medical grade 1 compression stockings on a regular basis however recently the swelling has gotten so severe that she has been unable to wear them.  The patient has tried 2 different antibiotics without success for healing her cellulitis.  The patient is also on diuretics but these also have not helped with her lower extremity edema.  She denies any fever, chills, nausea, vomiting or diarrhea.  The patient does not endorse having any excessive weeping.   Review of Systems  Cardiovascular: Positive for leg swelling.  All other systems reviewed and are negative.      Objective:   Physical Exam Vitals reviewed.  HENT:     Head: Normocephalic.  Cardiovascular:     Rate and Rhythm: Normal rate and regular rhythm.  Pulmonary:     Effort: Pulmonary effort is normal.  Musculoskeletal:     Right lower leg: 3+ Pitting Edema present.     Left lower leg: 3+ Pitting Edema present.  Skin:    General: Skin is warm.     Findings: Erythema and rash present.  Neurological:     Mental Status: She is alert and oriented to person, place, and time.  Psychiatric:        Mood and Affect: Mood normal.        Behavior: Behavior normal.        Thought Content: Thought content normal.        Judgment: Judgment normal.     BP (!) 175/113 (BP Location: Right Arm)   Resp 18   Ht 5\' 2"  (1.575 m)   Wt (!) 381 lb (172.8 kg)   BMI 69.69 kg/m   Past Medical History:  Diagnosis Date  . Abnormal mammogram, unspecified 2013  . Atrial fibrillation (HCC)    2013  .  Breast screening, unspecified   . COPD (chronic obstructive pulmonary disease) (HCC)   . History of continuous positive airway pressure (CPAP) therapy at home   . Obesity, unspecified   . Screening for obesity   . Special screening for malignant neoplasms, colon   . Unspecified essential hypertension     Social History   Socioeconomic History  . Marital status: Divorced    Spouse name: Not on file  . Number of children: Not on file  . Years of education: Not on file  . Highest education level: Not on file  Occupational History  . Not on file  Tobacco Use  . Smoking status: Never Smoker  . Smokeless tobacco: Never Used  Vaping Use  . Vaping Use: Never used  Substance and Sexual Activity  . Alcohol use: No  . Drug use: No  . Sexual activity: Not on file  Other Topics Concern  . Not on file  Social History Narrative  . Not on file   Social Determinants of Health   Financial Resource Strain:   . Difficulty of Paying Living Expenses:   Food Insecurity:   . Worried About 2014 in the Last Year:   . Ran  Out of Food in the Last Year:   Transportation Needs:   . Lack of Transportation (Medical):   Marland Kitchen Lack of Transportation (Non-Medical):   Physical Activity:   . Days of Exercise per Week:   . Minutes of Exercise per Session:   Stress:   . Feeling of Stress :   Social Connections:   . Frequency of Communication with Friends and Family:   . Frequency of Social Gatherings with Friends and Family:   . Attends Religious Services:   . Active Member of Clubs or Organizations:   . Attends Banker Meetings:   Marland Kitchen Marital Status:   Intimate Partner Violence:   . Fear of Current or Ex-Partner:   . Emotionally Abused:   Marland Kitchen Physically Abused:   . Sexually Abused:     Past Surgical History:  Procedure Laterality Date  . ABDOMINAL HYSTERECTOMY  2011  . ANTERIOR CRUCIATE LIGAMENT REPAIR  2003  . BREAST BIOPSY Left January 22, 2012   left breast  stereotactic biopsy showed fibroadenomatous changes with microcalcifications, fat necrosis, and sclerosing adenosis and columnar cell changes. No evidence of atypia or malignancy.  Marland Kitchen CARDIOVERSION N/A 09/22/2016   Procedure: CARDIOVERSION;  Surgeon: Dalia Heading, MD;  Location: [facility];  Service: Cardiovascular;  Laterality: N/A;  . CARDIOVERSION N/A 01/17/2018   Procedure: CARDIOVERSION;  Surgeon: Dalia Heading, MD;  Location: [facility];  Service: Cardiovascular;  Laterality: N/A;  . CARDIOVERSION N/A 01/09/2019   Procedure: CARDIOVERSION;  Surgeon: Dalia Heading, MD;  Location: [facility];  Service: Cardiovascular;  Laterality: N/A;  . CESAREAN SECTION  1985, 1987, and 1998  . COLONOSCOPY  2011   Dr. Mechele Collin  . ELECTROPHYSIOLOGIC STUDY N/A 12/03/2014   Procedure: Cardioversion;  Surgeon: Dalia Heading, MD;  Location: [facility];  Service: Cardiovascular;  Laterality: N/A;  . ELECTROPHYSIOLOGIC STUDY N/A 04/01/2015   Procedure: Cardioversion;  Surgeon: Dalia Heading, MD;  Location: [facility];  Service: Cardiovascular;  Laterality: N/A;  . KNEE SURGERY  2013    Family History  Problem Relation Age of Onset  . Colon cancer Mother        diagnosis age 1's  . Colon cancer Sister        diagnosis age 48  . Atrial fibrillation Sister   . Breast cancer Other        Maternal Grandmother; diagnosis age 53's  . Lung cancer Other        Paternal Grandmother; diagnosis age 31's  . Colon cancer Other        Paternal Grandfather; diagnosis age 94's  . Heart disease Father     Allergies  Allergen Reactions  . Amiodarone     Caused toxicity        Assessment & Plan:   1. Recurrent cellulitis of lower extremity The patient has had recurrent cellulitis.  This cellulitis is likely related to the continued edema that the patient continues to have.  We will treat the patient with doxycycline as well as a muscle cycling for her cellulitis.  We will have the patient continue to come into the  office for bilateral Unna wraps to be changed on a weekly basis.  The patient is also advised that if she begins to weep through her dressing she should contact her office for changes if need be.  Otherwise, we will reevaluate the patient's lower extremity edema and swelling in 4 weeks.  We will also evaluate the patient's lower extremity cellulitis during wrap  changes. - doxycycline (VIBRA-TABS) 100 MG tablet; Take 1 tablet (100 mg total) by mouth 2 (two) times daily. For 10 days. Take with full glass of water, stay upright 30 min after taking.  Dispense: 20 tablet; Refill: 0  2. Lymphedema I have had a long discussion with the patient regarding swelling and why it  causes symptoms.  We will have the patient placed in bilateral Unna wraps as her compression stockings have not been giving her adequate compression.  The patient will also continue to elevate her lower extremities in addition to ambulating as much as possible.  I have reviewed systemic causes for chronic edema such as liver, kidney and cardiac etiologies.  The patient denies problems with these organ systems.    Consideration for a lymph pump will also be made based upon the effectiveness of conservative therapy.  This would help to improve the edema control and prevent sequela such as ulcers and infections      Current Outpatient Medications on File Prior to Visit  Medication Sig Dispense Refill  . albuterol (VENTOLIN HFA) 108 (90 Base) MCG/ACT inhaler Inhale 2 puffs into the lungs every 6 (six) hours as needed for wheezing. 8 g 5  . apixaban (ELIQUIS) 5 MG TABS tablet Take 5 mg by mouth 2 (two) times daily.     Marland Kitchen diltiazem (CARDIZEM CD) 240 MG 24 hr capsule Take 240 mg by mouth daily after breakfast.    . diltiazem (TIAZAC) 240 MG 24 hr capsule TAKE 1 CAPSULE (240 MG TOTAL) BY MOUTH ONCE DAILY.    . furosemide (LASIX) 40 MG tablet Take 40 mg by mouth daily.  5  . levothyroxine (SYNTHROID) 75 MCG tablet TAKE 1 TABLET (75 MCG TOTAL)  BY MOUTH DAILY BEFORE BREAKFAST. 90 tablet 1  . OXYGEN Inhale 2 L/L into the lungs as needed.     . potassium chloride SA (K-DUR,KLOR-CON) 20 MEQ tablet Take 1 tablet (20 mEq total) by mouth daily. (Patient taking differently: Take 20 mEq by mouth at bedtime. ) 30 tablet 6  . predniSONE (DELTASONE) 10 MG tablet TAKE 1 TABLET (10 MG TOTAL) BY MOUTH DAILY WITH BREAKFAST. 30 tablet 5  . predniSONE (DELTASONE) 5 MG tablet TAKE 1 TABLET BY MOUTH EVERY DAY WITH BREAKFAST 30 tablet 1  . Spacer/Aero-Holding Chambers DEVI 1 Device by Does not apply route as needed. 1 each 0  . TRELEGY ELLIPTA 100-62.5-25 MCG/INH AEPB INHALE 1 INHALER INTO THE LUNGS DAILY. 180 each 4  . amLODipine (NORVASC) 5 MG tablet Take 1 tablet (5 mg total) by mouth daily. (Patient not taking: Reported on 10/02/2019) 14 tablet 0  . cephALEXin (KEFLEX) 500 MG capsule Take 1 capsule (500 mg total) by mouth 3 (three) times daily. For 7 days (Patient not taking: Reported on 10/02/2019) 21 capsule 0  . cyanocobalamin 2000 MCG tablet Take 2,000 mcg by mouth daily. (Patient not taking: Reported on 10/02/2019)     No current facility-administered medications on file prior to visit.    There are no Patient Instructions on file for this visit. No follow-ups on file.   Kris Hartmann, NP

## 2019-10-08 DIAGNOSIS — J189 Pneumonia, unspecified organism: Secondary | ICD-10-CM | POA: Diagnosis not present

## 2019-10-09 ENCOUNTER — Other Ambulatory Visit: Payer: Self-pay

## 2019-10-09 ENCOUNTER — Ambulatory Visit (INDEPENDENT_AMBULATORY_CARE_PROVIDER_SITE_OTHER): Payer: BC Managed Care – PPO | Admitting: Vascular Surgery

## 2019-10-09 ENCOUNTER — Encounter (INDEPENDENT_AMBULATORY_CARE_PROVIDER_SITE_OTHER): Payer: Self-pay

## 2019-10-09 DIAGNOSIS — I83009 Varicose veins of unspecified lower extremity with ulcer of unspecified site: Secondary | ICD-10-CM | POA: Insufficient documentation

## 2019-10-09 DIAGNOSIS — L97909 Non-pressure chronic ulcer of unspecified part of unspecified lower leg with unspecified severity: Secondary | ICD-10-CM | POA: Diagnosis not present

## 2019-10-09 NOTE — Progress Notes (Signed)
History of Present Illness  There is no documented history at this time  Assessments & Plan   There are no diagnoses linked to this encounter.    Additional instructions  Subjective:  Patient presents with venous ulcer of the Bilateral lower extremity.    Procedure:  3 layer unna wrap was placed Bilateral lower extremity.   Plan:   Follow up in one week.  

## 2019-10-14 ENCOUNTER — Other Ambulatory Visit: Payer: Self-pay

## 2019-10-14 ENCOUNTER — Ambulatory Visit (INDEPENDENT_AMBULATORY_CARE_PROVIDER_SITE_OTHER): Payer: BC Managed Care – PPO | Admitting: Nurse Practitioner

## 2019-10-14 ENCOUNTER — Encounter (INDEPENDENT_AMBULATORY_CARE_PROVIDER_SITE_OTHER): Payer: Self-pay | Admitting: Nurse Practitioner

## 2019-10-14 ENCOUNTER — Telehealth (INDEPENDENT_AMBULATORY_CARE_PROVIDER_SITE_OTHER): Payer: Self-pay | Admitting: Vascular Surgery

## 2019-10-14 VITALS — BP 170/93 | HR 106 | Ht 62.0 in | Wt 382.0 lb

## 2019-10-14 DIAGNOSIS — L03119 Cellulitis of unspecified part of limb: Secondary | ICD-10-CM

## 2019-10-14 NOTE — Telephone Encounter (Signed)
Called stating that her unna boot came off yesterday. She is due to come back on 10-16-19 for another wrap and was wondering if she should wait or can she come in sooner? Please advise.

## 2019-10-14 NOTE — Telephone Encounter (Signed)
The patient can come in today for unna wrap

## 2019-10-14 NOTE — Progress Notes (Signed)
History of Present Illness  There is no documented history at this time  Assessments & Plan   There are no diagnoses linked to this encounter.    Additional instructions  Subjective:  Patient presents with venous ulcer of the Bilateral lower extremity.    Procedure:  3 layer unna wrap was placed Bilateral lower extremity.   Plan:   Follow up in one week.  

## 2019-10-16 ENCOUNTER — Encounter (INDEPENDENT_AMBULATORY_CARE_PROVIDER_SITE_OTHER): Payer: BC Managed Care – PPO

## 2019-10-20 DIAGNOSIS — Z6841 Body Mass Index (BMI) 40.0 and over, adult: Secondary | ICD-10-CM | POA: Diagnosis not present

## 2019-10-20 DIAGNOSIS — I83009 Varicose veins of unspecified lower extremity with ulcer of unspecified site: Secondary | ICD-10-CM | POA: Diagnosis not present

## 2019-10-20 DIAGNOSIS — I4819 Other persistent atrial fibrillation: Secondary | ICD-10-CM | POA: Diagnosis not present

## 2019-10-20 DIAGNOSIS — G4733 Obstructive sleep apnea (adult) (pediatric): Secondary | ICD-10-CM | POA: Diagnosis not present

## 2019-10-21 ENCOUNTER — Ambulatory Visit (INDEPENDENT_AMBULATORY_CARE_PROVIDER_SITE_OTHER): Payer: BC Managed Care – PPO | Admitting: Nurse Practitioner

## 2019-10-21 ENCOUNTER — Encounter (INDEPENDENT_AMBULATORY_CARE_PROVIDER_SITE_OTHER): Payer: Self-pay

## 2019-10-21 ENCOUNTER — Other Ambulatory Visit: Payer: Self-pay

## 2019-10-21 VITALS — BP 201/88 | HR 100 | Resp 18 | Wt 379.6 lb

## 2019-10-21 DIAGNOSIS — L03119 Cellulitis of unspecified part of limb: Secondary | ICD-10-CM

## 2019-10-21 NOTE — Progress Notes (Signed)
History of Present Illness  There is no documented history at this time  Assessments & Plan   There are no diagnoses linked to this encounter.    Additional instructions  Subjective:  Patient presents with venous ulcer of the Bilateral lower extremity.    Procedure:  3 layer unna wrap was placed Bilateral lower extremity.   Plan:   Follow up in one week.  

## 2019-10-23 ENCOUNTER — Encounter (INDEPENDENT_AMBULATORY_CARE_PROVIDER_SITE_OTHER): Payer: BC Managed Care – PPO

## 2019-10-28 ENCOUNTER — Encounter (INDEPENDENT_AMBULATORY_CARE_PROVIDER_SITE_OTHER): Payer: Self-pay | Admitting: Nurse Practitioner

## 2019-10-28 ENCOUNTER — Ambulatory Visit (INDEPENDENT_AMBULATORY_CARE_PROVIDER_SITE_OTHER): Payer: BC Managed Care – PPO | Admitting: Nurse Practitioner

## 2019-10-28 ENCOUNTER — Other Ambulatory Visit: Payer: Self-pay

## 2019-10-28 VITALS — BP 166/76 | HR 111 | Resp 22 | Ht 62.0 in | Wt 380.0 lb

## 2019-10-28 DIAGNOSIS — I83009 Varicose veins of unspecified lower extremity with ulcer of unspecified site: Secondary | ICD-10-CM

## 2019-10-28 DIAGNOSIS — L97909 Non-pressure chronic ulcer of unspecified part of unspecified lower leg with unspecified severity: Secondary | ICD-10-CM

## 2019-10-28 DIAGNOSIS — L03119 Cellulitis of unspecified part of limb: Secondary | ICD-10-CM | POA: Diagnosis not present

## 2019-10-28 DIAGNOSIS — I890 Lymphedema, not elsewhere classified: Secondary | ICD-10-CM | POA: Diagnosis not present

## 2019-10-28 MED ORDER — CEPHALEXIN 500 MG PO CAPS: 500.0000 mg | ORAL_CAPSULE | Freq: Three times a day (TID) | ORAL | 0 refills | Status: DC

## 2019-10-30 ENCOUNTER — Ambulatory Visit (INDEPENDENT_AMBULATORY_CARE_PROVIDER_SITE_OTHER): Payer: BC Managed Care – PPO | Admitting: Vascular Surgery

## 2019-10-31 ENCOUNTER — Encounter (INDEPENDENT_AMBULATORY_CARE_PROVIDER_SITE_OTHER): Payer: Self-pay | Admitting: Nurse Practitioner

## 2019-10-31 NOTE — Progress Notes (Signed)
Subjective:    [name], female    DOB: 01/06/1961, 59 y.o.   MRN: [id number] Chief Complaint  Patient presents with  . Follow-up    Patient returns to the office today for follow-up regarding bilateral lower extremity edema and recurrent cellulitis.  Today the patient's edema still persist and the patient's left lower extremity is concerning for cellulitis.  Previously the patient was placed on doxycycline and amoxicillin however the patient notes that Keflex seemed to work somewhat better for her.  The patient also has a small ulceration that is weeping currently.  The patient currently takes 1 Lasix daily however she notes that it is not extremely effective.  She denies any chest pain shortness of breath.  She denies any fever, chills, nausea, vomiting or diarrhea.  The patient has been compliant with wearing her Unna wraps in addition to elevating her lower extremities on a regular basis.   Review of Systems  Cardiovascular: Positive for leg swelling.  Skin: Positive for color change and wound.  All other systems reviewed and are negative.      Objective:   Physical Exam Vitals reviewed.  HENT:     Head: Normocephalic.  Cardiovascular:     Rate and Rhythm: Normal rate and regular rhythm.     Heart sounds: Normal heart sounds.  Pulmonary:     Effort: Pulmonary effort is normal.     Breath sounds: Normal breath sounds.  Musculoskeletal:     Right lower leg: 3+ Pitting Edema present.     Left lower leg: 3+ Pitting Edema present.  Neurological:     Mental Status: She is alert and oriented to person, place, and time.  Psychiatric:        Mood and Affect: Mood normal.        Behavior: Behavior normal.        Thought Content: Thought content normal.        Judgment: Judgment normal.     BP (!) 166/76 (BP Location: Right Arm)   Pulse (!) 111   Resp (!) 22   Ht 5\' 2"  (1.575 m)   Wt (!) 380 lb (172.4 kg)   BMI 69.50 kg/m   Past Medical History:    Diagnosis Date  . Abnormal mammogram, unspecified 2013  . Atrial fibrillation (HCC)    2013  . Breast screening, unspecified   . COPD (chronic obstructive pulmonary disease) (HCC)   . History of continuous positive airway pressure (CPAP) therapy at home   . Obesity, unspecified   . Screening for obesity   . Special screening for malignant neoplasms, colon   . Unspecified essential hypertension     Social History   Socioeconomic History  . Marital status: Divorced    Spouse name: Not on file  . Number of children: Not on file  . Years of education: Not on file  . Highest education level: Not on file  Occupational History  . Not on file  Tobacco Use  . Smoking status: Never Smoker  . Smokeless tobacco: Never Used  Vaping Use  . Vaping Use: Never used  Substance and Sexual Activity  . Alcohol use: No  . Drug use: No  . Sexual activity: Not on file  Other Topics Concern  . Not on file  Social History Narrative  . Not on file   Social Determinants of Health   Financial Resource Strain:   . Difficulty of Paying Living Expenses:   Food Insecurity:   .  Worried About Programme researcher, broadcasting/film/video in the Last Year:   . Barista in the Last Year:   Transportation Needs:   . Freight forwarder (Medical):   Marland Kitchen Lack of Transportation (Non-Medical):   Physical Activity:   . Days of Exercise per Week:   . Minutes of Exercise per Session:   Stress:   . Feeling of Stress :   Social Connections:   . Frequency of Communication with Friends and Family:   . Frequency of Social Gatherings with Friends and Family:   . Attends Religious Services:   . Active Member of Clubs or Organizations:   . Attends Banker Meetings:   Marland Kitchen Marital Status:   Intimate Partner Violence:   . Fear of Current or Ex-Partner:   . Emotionally Abused:   Marland Kitchen Physically Abused:   . Sexually Abused:     Past Surgical History:  Procedure Laterality Date  . ABDOMINAL HYSTERECTOMY  2011  .  ANTERIOR CRUCIATE LIGAMENT REPAIR  2003  . BREAST BIOPSY Left January 22, 2012   left breast stereotactic biopsy showed fibroadenomatous changes with microcalcifications, fat necrosis, and sclerosing adenosis and columnar cell changes. No evidence of atypia or malignancy.  Marland Kitchen CARDIOVERSION N/A 09/22/2016   Procedure: CARDIOVERSION;  Surgeon: Dalia Heading, MD;  Location: [facility];  Service: Cardiovascular;  Laterality: N/A;  . CARDIOVERSION N/A 01/17/2018   Procedure: CARDIOVERSION;  Surgeon: Dalia Heading, MD;  Location: [facility];  Service: Cardiovascular;  Laterality: N/A;  . CARDIOVERSION N/A 01/09/2019   Procedure: CARDIOVERSION;  Surgeon: Dalia Heading, MD;  Location: [facility];  Service: Cardiovascular;  Laterality: N/A;  . CESAREAN SECTION  1985, 1987, and 1998  . COLONOSCOPY  2011   Dr. Mechele Collin  . ELECTROPHYSIOLOGIC STUDY N/A 12/03/2014   Procedure: Cardioversion;  Surgeon: Dalia Heading, MD;  Location: [facility];  Service: Cardiovascular;  Laterality: N/A;  . ELECTROPHYSIOLOGIC STUDY N/A 04/01/2015   Procedure: Cardioversion;  Surgeon: Dalia Heading, MD;  Location: [facility];  Service: Cardiovascular;  Laterality: N/A;  . KNEE SURGERY  2013    Family History  Problem Relation Age of Onset  . Colon cancer Mother        diagnosis age 73's  . Colon cancer Sister        diagnosis age 59  . Atrial fibrillation Sister   . Breast cancer Other        Maternal Grandmother; diagnosis age 76's  . Lung cancer Other        Paternal Grandmother; diagnosis age 35's  . Colon cancer Other        Paternal Grandfather; diagnosis age 7's  . Heart disease Father     Allergies  Allergen Reactions  . Amiodarone     Caused toxicity        Assessment & Plan:   1. Recurrent cellulitis of lower extremity Previously placed the patient on doxycycline and amoxicillin however the patient still has evidence of cellulitis today.  The patient notes that she seemed to do a bit better on Keflex.   We will place the patient back onto Keflex today. - cephALEXin (KEFLEX) 500 MG capsule; Take 1 capsule (500 mg total) by mouth 3 (three) times daily. For 7 days  Dispense: 30 capsule; Refill: 0  2. Lymphedema The patient has continued swelling despite Unna wraps.  We will have the patient continue with bilateral Unna wraps due to recurrent cellulitis and ulceration.  Patient is also  advised to elevate her lower extremities much as possible in addition to walking when possible.  Otherwise the patient will return to the office on a weekly basis for Unna wrap changes.  We will reevaluate her lower extremity edema and changes in 4 weeks.  3. Venous ulcer (HCC) We will place the patient back into bilateral Unna wraps.  The patient is also had some weeping of her skin despite diuretic usage.  I will have her increase her diuretic for a week to see if this helps with reducing some of the swelling and weeping.  If it is not effective, we may need to refer the patient to her primary care for medication titration.   Current Outpatient Medications on File Prior to Visit  Medication Sig Dispense Refill  . albuterol (VENTOLIN HFA) 108 (90 Base) MCG/ACT inhaler Inhale 2 puffs into the lungs every 6 (six) hours as needed for wheezing. 8 g 5  . amLODipine (NORVASC) 5 MG tablet Take 1 tablet (5 mg total) by mouth daily. 14 tablet 0  . amoxicillin (AMOXIL) 875 MG tablet Take 1 tablet (875 mg total) by mouth 2 (two) times daily. 10 tablet 0  . apixaban (ELIQUIS) 5 MG TABS tablet Take 5 mg by mouth 2 (two) times daily.     . cyanocobalamin 2000 MCG tablet Take 2,000 mcg by mouth daily.     Marland Kitchen diltiazem (CARDIZEM CD) 240 MG 24 hr capsule Take 240 mg by mouth daily after breakfast.    . diltiazem (TIAZAC) 240 MG 24 hr capsule TAKE 1 CAPSULE (240 MG TOTAL) BY MOUTH ONCE DAILY.    Marland Kitchen doxycycline (VIBRA-TABS) 100 MG tablet Take 1 tablet (100 mg total) by mouth 2 (two) times daily. For 10 days. Take with full glass of water,  stay upright 30 min after taking. 20 tablet 0  . furosemide (LASIX) 40 MG tablet Take 40 mg by mouth daily.  5  . levothyroxine (SYNTHROID) 75 MCG tablet TAKE 1 TABLET (75 MCG TOTAL) BY MOUTH DAILY BEFORE BREAKFAST. 90 tablet 1  . OXYGEN Inhale 2 L/L into the lungs as needed.     . potassium chloride SA (K-DUR,KLOR-CON) 20 MEQ tablet Take 1 tablet (20 mEq total) by mouth daily. (Patient taking differently: Take 20 mEq by mouth at bedtime. ) 30 tablet 6  . predniSONE (DELTASONE) 10 MG tablet TAKE 1 TABLET (10 MG TOTAL) BY MOUTH DAILY WITH BREAKFAST. 30 tablet 5  . predniSONE (DELTASONE) 5 MG tablet TAKE 1 TABLET BY MOUTH EVERY DAY WITH BREAKFAST 30 tablet 1  . Spacer/Aero-Holding Chambers DEVI 1 Device by Does not apply route as needed. 1 each 0  . TRELEGY ELLIPTA 100-62.5-25 MCG/INH AEPB INHALE 1 INHALER INTO THE LUNGS DAILY. 180 each 4   No current facility-administered medications on file prior to visit.    There are no Patient Instructions on file for this visit. No follow-ups on file.   Georgiana Spinner, NP

## 2019-11-04 ENCOUNTER — Other Ambulatory Visit: Payer: Self-pay

## 2019-11-04 ENCOUNTER — Ambulatory Visit (INDEPENDENT_AMBULATORY_CARE_PROVIDER_SITE_OTHER): Payer: BC Managed Care – PPO | Admitting: Nurse Practitioner

## 2019-11-04 ENCOUNTER — Encounter (INDEPENDENT_AMBULATORY_CARE_PROVIDER_SITE_OTHER): Payer: Self-pay | Admitting: Nurse Practitioner

## 2019-11-04 VITALS — BP 165/84 | HR 97 | Resp 22 | Ht 62.0 in | Wt 377.0 lb

## 2019-11-04 DIAGNOSIS — I890 Lymphedema, not elsewhere classified: Secondary | ICD-10-CM | POA: Diagnosis not present

## 2019-11-04 DIAGNOSIS — L03119 Cellulitis of unspecified part of limb: Secondary | ICD-10-CM

## 2019-11-04 DIAGNOSIS — I83009 Varicose veins of unspecified lower extremity with ulcer of unspecified site: Secondary | ICD-10-CM | POA: Diagnosis not present

## 2019-11-04 DIAGNOSIS — L97909 Non-pressure chronic ulcer of unspecified part of unspecified lower leg with unspecified severity: Secondary | ICD-10-CM | POA: Diagnosis not present

## 2019-11-06 ENCOUNTER — Encounter (INDEPENDENT_AMBULATORY_CARE_PROVIDER_SITE_OTHER): Payer: Self-pay | Admitting: Nurse Practitioner

## 2019-11-06 NOTE — Progress Notes (Signed)
Subjective:    [name], female    DOB: 21-Nov-1960, 59 y.o.   MRN: [id number] Chief Complaint  Patient presents with  . Follow-up    Patient presents today for evaluation of lower extremity edema.  The patient was in bilateral Unna wraps and had a oozing ulceration as well as weeping.  The patient increased her Lasix for an extra week, in addition to wearing her Unna wraps.  The patient also had worsening of her cellulitis.  Today, the patient does have some redness however this may be from irritation of the Unna wraps.  The swelling however it is much improved with no weeping or ulceration present.  The patient does state that her legs feel much better and that the extra Lasix was helpful in the short.  She denies any fever, chills, nausea, vomiting or diarrhea.   Review of Systems  Cardiovascular: Positive for leg swelling.  All other systems reviewed and are negative.      Objective:   Physical Exam Vitals reviewed.  Constitutional:      Appearance: She is obese.  HENT:     Head: Normocephalic.  Cardiovascular:     Rate and Rhythm: Normal rate and regular rhythm.     Pulses: Normal pulses.  Pulmonary:     Effort: Pulmonary effort is normal.  Musculoskeletal:     Right lower leg: 3+ Pitting Edema present.     Left lower leg: 3+ Pitting Edema present.  Skin:    Findings: Erythema present.  Neurological:     Mental Status: She is alert and oriented to person, place, and time.  Psychiatric:        Mood and Affect: Mood normal.        Behavior: Behavior normal.        Thought Content: Thought content normal.        Judgment: Judgment normal.     BP (!) 165/84 (BP Location: Right Arm)   Pulse 97   Resp 22   Ht 5\' 2"  (1.575 m)   Wt (!) 377 lb (171 kg)   BMI 68.95 kg/m   Past Medical History:  Diagnosis Date  . Abnormal mammogram, unspecified 2013  . Atrial fibrillation (HCC)    2013  . Breast screening, unspecified   . COPD (chronic obstructive  pulmonary disease) (HCC)   . History of continuous positive airway pressure (CPAP) therapy at home   . Obesity, unspecified   . Screening for obesity   . Special screening for malignant neoplasms, colon   . Unspecified essential hypertension     Social History   Socioeconomic History  . Marital status: Divorced    Spouse name: Not on file  . Number of children: Not on file  . Years of education: Not on file  . Highest education level: Not on file  Occupational History  . Not on file  Tobacco Use  . Smoking status: Never Smoker  . Smokeless tobacco: Never Used  Vaping Use  . Vaping Use: Never used  Substance and Sexual Activity  . Alcohol use: No  . Drug use: No  . Sexual activity: Not on file  Other Topics Concern  . Not on file  Social History Narrative  . Not on file   Social Determinants of Health   Financial Resource Strain:   . Difficulty of Paying Living Expenses:   Food Insecurity:   . Worried About 2014 in the Last Year:   .  Ran Out of Food in the Last Year:   Transportation Needs:   . Freight forwarder (Medical):   Marland Kitchen Lack of Transportation (Non-Medical):   Physical Activity:   . Days of Exercise per Week:   . Minutes of Exercise per Session:   Stress:   . Feeling of Stress :   Social Connections:   . Frequency of Communication with Friends and Family:   . Frequency of Social Gatherings with Friends and Family:   . Attends Religious Services:   . Active Member of Clubs or Organizations:   . Attends Banker Meetings:   Marland Kitchen Marital Status:   Intimate Partner Violence:   . Fear of Current or Ex-Partner:   . Emotionally Abused:   Marland Kitchen Physically Abused:   . Sexually Abused:     Past Surgical History:  Procedure Laterality Date  . ABDOMINAL HYSTERECTOMY  2011  . ANTERIOR CRUCIATE LIGAMENT REPAIR  2003  . BREAST BIOPSY Left January 22, 2012   left breast stereotactic biopsy showed fibroadenomatous changes with  microcalcifications, fat necrosis, and sclerosing adenosis and columnar cell changes. No evidence of atypia or malignancy.  Marland Kitchen CARDIOVERSION N/A 09/22/2016   Procedure: CARDIOVERSION;  Surgeon: Dalia Heading, MD;  Location: [facility];  Service: Cardiovascular;  Laterality: N/A;  . CARDIOVERSION N/A 01/17/2018   Procedure: CARDIOVERSION;  Surgeon: Dalia Heading, MD;  Location: [facility];  Service: Cardiovascular;  Laterality: N/A;  . CARDIOVERSION N/A 01/09/2019   Procedure: CARDIOVERSION;  Surgeon: Dalia Heading, MD;  Location: [facility];  Service: Cardiovascular;  Laterality: N/A;  . CESAREAN SECTION  1985, 1987, and 1998  . COLONOSCOPY  2011   Dr. Mechele Collin  . ELECTROPHYSIOLOGIC STUDY N/A 12/03/2014   Procedure: Cardioversion;  Surgeon: Dalia Heading, MD;  Location: [facility];  Service: Cardiovascular;  Laterality: N/A;  . ELECTROPHYSIOLOGIC STUDY N/A 04/01/2015   Procedure: Cardioversion;  Surgeon: Dalia Heading, MD;  Location: [facility];  Service: Cardiovascular;  Laterality: N/A;  . KNEE SURGERY  2013    Family History  Problem Relation Age of Onset  . Colon cancer Mother        diagnosis age 42's  . Colon cancer Sister        diagnosis age 87  . Atrial fibrillation Sister   . Breast cancer Other        Maternal Grandmother; diagnosis age 42's  . Lung cancer Other        Paternal Grandmother; diagnosis age 24's  . Colon cancer Other        Paternal Grandfather; diagnosis age 40's  . Heart disease Father     Allergies  Allergen Reactions  . Amiodarone     Caused toxicity        Assessment & Plan:   1. Venous ulcer (HCC) This is mostly resolved at this time.  Because it has mostly resolved we will allow the patient to transition to medical grade 1 compression stockings and follow conservative therapy as outlined below.  2. Lymphedema I have had a long discussion with the patient regarding swelling and why it  causes symptoms.  Patient will begin wearing graduated  compression stockings class 1 (20-30 mmHg) on a daily basis a prescription was given. The patient will  beginning wearing the stockings first thing in the morning and removing them in the evening. The patient is instructed specifically not to sleep in the stockings.   In addition, behavioral modification will be initiated.  This will  include frequent elevation, use of over the counter pain medications and exercise such as walking.  I have reviewed systemic causes for chronic edema such as liver, kidney and cardiac etiologies.  The patient denies problems with these organ systems.    Consideration for a lymph pump will also be made based upon the effectiveness of conservative therapy.  This would help to improve the edema control and prevent sequela such as ulcers and infections   Patient will follow up in 3 months for evaluation of lower extremity edema, sooner if she begins to weep or has any blisters and ulcerations.  3. Recurrent cellulitis of lower extremity Patient does have some persistent redness present however it appears that cellulitis is resolved at this time.   Current Outpatient Medications on File Prior to Visit  Medication Sig Dispense Refill  . albuterol (VENTOLIN HFA) 108 (90 Base) MCG/ACT inhaler Inhale 2 puffs into the lungs every 6 (six) hours as needed for wheezing. 8 g 5  . amLODipine (NORVASC) 5 MG tablet Take 1 tablet (5 mg total) by mouth daily. 14 tablet 0  . amoxicillin (AMOXIL) 875 MG tablet Take 1 tablet (875 mg total) by mouth 2 (two) times daily. 10 tablet 0  . apixaban (ELIQUIS) 5 MG TABS tablet Take 5 mg by mouth 2 (two) times daily.     . cephALEXin (KEFLEX) 500 MG capsule Take 1 capsule (500 mg total) by mouth 3 (three) times daily. For 7 days 30 capsule 0  . cyanocobalamin 2000 MCG tablet Take 2,000 mcg by mouth daily.     Marland Kitchen diltiazem (CARDIZEM CD) 240 MG 24 hr capsule Take 240 mg by mouth daily after breakfast.    . diltiazem (TIAZAC) 240 MG 24 hr capsule  TAKE 1 CAPSULE (240 MG TOTAL) BY MOUTH ONCE DAILY.    Marland Kitchen doxycycline (VIBRA-TABS) 100 MG tablet Take 1 tablet (100 mg total) by mouth 2 (two) times daily. For 10 days. Take with full glass of water, stay upright 30 min after taking. 20 tablet 0  . furosemide (LASIX) 40 MG tablet Take 40 mg by mouth daily.  5  . levothyroxine (SYNTHROID) 75 MCG tablet TAKE 1 TABLET (75 MCG TOTAL) BY MOUTH DAILY BEFORE BREAKFAST. 90 tablet 1  . OXYGEN Inhale 2 L/L into the lungs as needed.     . potassium chloride SA (K-DUR,KLOR-CON) 20 MEQ tablet Take 1 tablet (20 mEq total) by mouth daily. (Patient taking differently: Take 20 mEq by mouth at bedtime. ) 30 tablet 6  . predniSONE (DELTASONE) 10 MG tablet TAKE 1 TABLET (10 MG TOTAL) BY MOUTH DAILY WITH BREAKFAST. 30 tablet 5  . predniSONE (DELTASONE) 5 MG tablet TAKE 1 TABLET BY MOUTH EVERY DAY WITH BREAKFAST 30 tablet 1  . Spacer/Aero-Holding Chambers DEVI 1 Device by Does not apply route as needed. 1 each 0  . TRELEGY ELLIPTA 100-62.5-25 MCG/INH AEPB INHALE 1 INHALER INTO THE LUNGS DAILY. 180 each 4   No current facility-administered medications on file prior to visit.    There are no Patient Instructions on file for this visit. No follow-ups on file.   Georgiana Spinner, NP

## 2019-11-08 DIAGNOSIS — J189 Pneumonia, unspecified organism: Secondary | ICD-10-CM | POA: Diagnosis not present

## 2019-11-10 ENCOUNTER — Other Ambulatory Visit: Payer: Self-pay | Admitting: Family Medicine

## 2019-11-10 DIAGNOSIS — E039 Hypothyroidism, unspecified: Secondary | ICD-10-CM

## 2019-11-10 NOTE — Telephone Encounter (Signed)
Requested medication (s) are due for refill today: Yes  Requested medication (s) are on the active medication list: Yes  Last refill:  05/12/19  Future visit scheduled: No  Notes to clinic:  Pt. Needs labs.    Requested Prescriptions  Pending Prescriptions Disp Refills   levothyroxine (SYNTHROID) 75 MCG tablet [Pharmacy Med Name: LEVOTHYROXINE 75 MCG TABLET] 90 tablet 1    Sig: TAKE 1 TABLET (75 MCG TOTAL) BY MOUTH DAILY BEFORE BREAKFAST.      Endocrinology:  Hypothyroid Agents Failed - 11/10/2019  1:37 AM      Failed - TSH needs to be rechecked within 3 months after an abnormal result. Refill until TSH is due.      Failed - TSH in normal range and within 360 days    TSH  Date Value Ref Range Status  01/19/2017 3.55 0.40 - 4.50 mIU/L Final          Passed - Valid encounter within last 12 months    Recent Outpatient Visits           3 months ago Recurrent cellulitis of lower extremity   [facility], Netta Neat, DO   7 months ago Pneumonia of right lower lobe due to infectious organism   [facility], Netta Neat, DO   8 months ago Pneumonia of both lungs due to infectious organism, unspecified part of lung   [facility], Netta Neat, DO   8 months ago Acute respiratory failure with hypoxia [facility])   [facility] Smitty Cords, DO   1 year ago Acquired hypothyroidism   [facility], Netta Neat, DO

## 2019-12-09 DIAGNOSIS — J189 Pneumonia, unspecified organism: Secondary | ICD-10-CM | POA: Diagnosis not present

## 2019-12-16 ENCOUNTER — Telehealth (INDEPENDENT_AMBULATORY_CARE_PROVIDER_SITE_OTHER): Payer: Self-pay | Admitting: Vascular Surgery

## 2019-12-16 NOTE — Telephone Encounter (Signed)
Per Sheppard Plumber,  NP bring the patient in to be seen no studies. Thank you

## 2019-12-16 NOTE — Telephone Encounter (Signed)
Called stating her Cellulitis has come back, and she would like to be seen. Patient was last seen 11-04-19 F/U w/ FB. Please advise.

## 2019-12-17 ENCOUNTER — Ambulatory Visit (INDEPENDENT_AMBULATORY_CARE_PROVIDER_SITE_OTHER): Payer: BC Managed Care – PPO | Admitting: Nurse Practitioner

## 2019-12-17 ENCOUNTER — Other Ambulatory Visit: Payer: Self-pay

## 2019-12-17 ENCOUNTER — Other Ambulatory Visit: Payer: Self-pay | Admitting: Internal Medicine

## 2019-12-17 ENCOUNTER — Encounter (INDEPENDENT_AMBULATORY_CARE_PROVIDER_SITE_OTHER): Payer: Self-pay | Admitting: Nurse Practitioner

## 2019-12-17 VITALS — BP 138/78 | HR 98 | Ht 63.0 in | Wt 368.0 lb

## 2019-12-17 DIAGNOSIS — L97909 Non-pressure chronic ulcer of unspecified part of unspecified lower leg with unspecified severity: Secondary | ICD-10-CM

## 2019-12-17 DIAGNOSIS — I83009 Varicose veins of unspecified lower extremity with ulcer of unspecified site: Secondary | ICD-10-CM

## 2019-12-17 DIAGNOSIS — L03119 Cellulitis of unspecified part of limb: Secondary | ICD-10-CM

## 2019-12-17 MED ORDER — SULFAMETHOXAZOLE-TRIMETHOPRIM 800-160 MG PO TABS: 1.0000 | ORAL_TABLET | Freq: Two times a day (BID) | ORAL | 0 refills | Status: DC

## 2019-12-22 ENCOUNTER — Encounter (INDEPENDENT_AMBULATORY_CARE_PROVIDER_SITE_OTHER): Payer: Self-pay | Admitting: Nurse Practitioner

## 2019-12-22 NOTE — Progress Notes (Signed)
Subjective:    [name], female    DOB: 04-13-1960, 59 y.o.   MRN: [id number] Chief Complaint  Patient presents with  . Follow-up    5mo follow up no studies    The patient returns today for evaluation of bilateral lower extremity edema in addition to cellulitis.  The patient previously had some wounds and recurrent cellulitis several months ago however with the wraps and utilization of compression socks the patient has done better.  However the patient notes that she recently went to the beach and was not able to wear compression during which time she noticed that the swelling recurred worse and she developed several wounds with weeping.  She denies any fever, chills, nausea, vomiting or diarrhea.  She denies any chest pain or shortness of breath.   Review of Systems  Cardiovascular: Positive for leg swelling.  Skin: Positive for rash and wound.  All other systems reviewed and are negative.      Objective:   Physical Exam Vitals reviewed.  HENT:     Head: Normocephalic.  Cardiovascular:     Rate and Rhythm: Normal rate and regular rhythm.     Pulses: Normal pulses.  Pulmonary:     Effort: Pulmonary effort is normal.  Musculoskeletal:     Right lower leg: 2+ Pitting Edema present.     Left lower leg: 2+ Pitting Edema present.  Skin:    General: Skin is warm and dry.     Findings: Erythema and rash present.  Neurological:     Mental Status: She is alert and oriented to person, place, and time.  Psychiatric:        Mood and Affect: Mood normal.        Behavior: Behavior normal.        Thought Content: Thought content normal.        Judgment: Judgment normal.     BP 138/78   Pulse 98   Ht 5\' 3"  (1.6 m)   Wt (!) 368 lb (166.9 kg)   BMI 65.19 kg/m   Past Medical History:  Diagnosis Date  . Abnormal mammogram, unspecified 2013  . Atrial fibrillation (HCC)    2013  . Breast screening, unspecified   . COPD (chronic obstructive pulmonary disease)  (HCC)   . History of continuous positive airway pressure (CPAP) therapy at home   . Obesity, unspecified   . Screening for obesity   . Special screening for malignant neoplasms, colon   . Unspecified essential hypertension     Social History   Socioeconomic History  . Marital status: Divorced    Spouse name: Not on file  . Number of children: Not on file  . Years of education: Not on file  . Highest education level: Not on file  Occupational History  . Not on file  Tobacco Use  . Smoking status: Never Smoker  . Smokeless tobacco: Never Used  Vaping Use  . Vaping Use: Never used  Substance and Sexual Activity  . Alcohol use: No  . Drug use: No  . Sexual activity: Not on file  Other Topics Concern  . Not on file  Social History Narrative  . Not on file   Social Determinants of Health   Financial Resource Strain:   . Difficulty of Paying Living Expenses: Not on file  Food Insecurity:   . Worried About 2014 in the Last Year: Not on file  . Ran Out of Food in  the Last Year: Not on file  Transportation Needs:   . Lack of Transportation (Medical): Not on file  . Lack of Transportation (Non-Medical): Not on file  Physical Activity:   . Days of Exercise per Week: Not on file  . Minutes of Exercise per Session: Not on file  Stress:   . Feeling of Stress : Not on file  Social Connections:   . Frequency of Communication with Friends and Family: Not on file  . Frequency of Social Gatherings with Friends and Family: Not on file  . Attends Religious Services: Not on file  . Active Member of Clubs or Organizations: Not on file  . Attends Banker Meetings: Not on file  . Marital Status: Not on file  Intimate Partner Violence:   . Fear of Current or Ex-Partner: Not on file  . Emotionally Abused: Not on file  . Physically Abused: Not on file  . Sexually Abused: Not on file    Past Surgical History:  Procedure Laterality Date  . ABDOMINAL  HYSTERECTOMY  2011  . ANTERIOR CRUCIATE LIGAMENT REPAIR  2003  . BREAST BIOPSY Left January 22, 2012   left breast stereotactic biopsy showed fibroadenomatous changes with microcalcifications, fat necrosis, and sclerosing adenosis and columnar cell changes. No evidence of atypia or malignancy.  Marland Kitchen CARDIOVERSION N/A 09/22/2016   Procedure: CARDIOVERSION;  Surgeon: Dalia Heading, MD;  Location: [facility];  Service: Cardiovascular;  Laterality: N/A;  . CARDIOVERSION N/A 01/17/2018   Procedure: CARDIOVERSION;  Surgeon: Dalia Heading, MD;  Location: [facility];  Service: Cardiovascular;  Laterality: N/A;  . CARDIOVERSION N/A 01/09/2019   Procedure: CARDIOVERSION;  Surgeon: Dalia Heading, MD;  Location: [facility];  Service: Cardiovascular;  Laterality: N/A;  . CESAREAN SECTION  1985, 1987, and 1998  . COLONOSCOPY  2011   Dr. Mechele Collin  . ELECTROPHYSIOLOGIC STUDY N/A 12/03/2014   Procedure: Cardioversion;  Surgeon: Dalia Heading, MD;  Location: [facility];  Service: Cardiovascular;  Laterality: N/A;  . ELECTROPHYSIOLOGIC STUDY N/A 04/01/2015   Procedure: Cardioversion;  Surgeon: Dalia Heading, MD;  Location: [facility];  Service: Cardiovascular;  Laterality: N/A;  . KNEE SURGERY  2013    Family History  Problem Relation Age of Onset  . Colon cancer Mother        diagnosis age 20's  . Colon cancer Sister        diagnosis age 31  . Atrial fibrillation Sister   . Breast cancer Other        Maternal Grandmother; diagnosis age 59's  . Lung cancer Other        Paternal Grandmother; diagnosis age 10's  . Colon cancer Other        Paternal Grandfather; diagnosis age 77's  . Heart disease Father     Allergies  Allergen Reactions  . Amiodarone     Caused toxicity        Assessment & Plan:   1. Venous ulcer (HCC) No surgery or intervention at this point in time.    I have had a long discussion with the patient regarding venous insufficiency and why it  causes symptoms, specifically venous  ulceration . I have discussed with the patient the chronic skin changes that accompany venous insufficiency and the long term sequela such as infection and recurring  ulceration.  Patient will be placed in [employer] which will be changed weekly drainage permitting.  In addition, behavioral modification including several periods of elevation of the lower  extremities during the day will be continued. Achieving a position with the ankles at heart level was stressed to the patient  The patient is instructed to begin routine exercise, especially walking on a daily basis  Patient will have her wraps changed in office on a weekly basis for the next 4 weeks.  At 4 weeks we will reevaluate the patient's lower extremity ulcerations and cellulitis.   2. Recurrent cellulitis of lower extremity Patient has cellulitis and weeping with ulcerations on her bilateral lower extremities.  We will start the patient on Bactrim for treatment of her cellulitis.  We will reevaluate wound progression during her change next week. - sulfamethoxazole-trimethoprim (BACTRIM DS) 800-160 MG tablet; Take 1 tablet by mouth 2 (two) times daily.  Dispense: 14 tablet; Refill: 0   Current Outpatient Medications on File Prior to Visit  Medication Sig Dispense Refill  . albuterol (VENTOLIN HFA) 108 (90 Base) MCG/ACT inhaler Inhale 2 puffs into the lungs every 6 (six) hours as needed for wheezing. 8 g 5  . amLODipine (NORVASC) 5 MG tablet Take 1 tablet (5 mg total) by mouth daily. 14 tablet 0  . amoxicillin (AMOXIL) 875 MG tablet Take 1 tablet (875 mg total) by mouth 2 (two) times daily. 10 tablet 0  . apixaban (ELIQUIS) 5 MG TABS tablet Take 5 mg by mouth 2 (two) times daily.     . cephALEXin (KEFLEX) 500 MG capsule Take 1 capsule (500 mg total) by mouth 3 (three) times daily. For 7 days 30 capsule 0  . cyanocobalamin 2000 MCG tablet Take 2,000 mcg by mouth daily.     Marland Kitchen diltiazem (CARDIZEM CD) 240 MG 24 hr capsule Take 240 mg by  mouth daily after breakfast.    . diltiazem (TIAZAC) 240 MG 24 hr capsule TAKE 1 CAPSULE (240 MG TOTAL) BY MOUTH ONCE DAILY.    Marland Kitchen doxycycline (VIBRA-TABS) 100 MG tablet Take 1 tablet (100 mg total) by mouth 2 (two) times daily. For 10 days. Take with full glass of water, stay upright 30 min after taking. 20 tablet 0  . furosemide (LASIX) 40 MG tablet Take 40 mg by mouth daily.  5  . levothyroxine (SYNTHROID) 75 MCG tablet Take 1 tablet (75 mcg total) by mouth daily before breakfast. Will need thyroid lab and follow-up for further refills 90 tablet 0  . OXYGEN Inhale 2 L/L into the lungs as needed.     . potassium chloride SA (K-DUR,KLOR-CON) 20 MEQ tablet Take 1 tablet (20 mEq total) by mouth daily. (Patient taking differently: Take 20 mEq by mouth at bedtime. ) 30 tablet 6  . predniSONE (DELTASONE) 10 MG tablet TAKE 1 TABLET (10 MG TOTAL) BY MOUTH DAILY WITH BREAKFAST. 30 tablet 5  . predniSONE (DELTASONE) 5 MG tablet TAKE 1 TABLET BY MOUTH EVERY DAY WITH BREAKFAST 30 tablet 1  . Spacer/Aero-Holding Chambers DEVI 1 Device by Does not apply route as needed. 1 each 0  . TRELEGY ELLIPTA 100-62.5-25 MCG/INH AEPB INHALE 1 INHALER INTO THE LUNGS DAILY. 180 each 4   No current facility-administered medications on file prior to visit.    There are no Patient Instructions on file for this visit. No follow-ups on file.   Georgiana Spinner, NP

## 2019-12-24 ENCOUNTER — Ambulatory Visit (INDEPENDENT_AMBULATORY_CARE_PROVIDER_SITE_OTHER): Payer: BC Managed Care – PPO | Admitting: Nurse Practitioner

## 2019-12-24 ENCOUNTER — Other Ambulatory Visit: Payer: Self-pay

## 2019-12-24 VITALS — BP 151/90 | HR 101 | Ht 62.0 in | Wt 368.0 lb

## 2019-12-24 DIAGNOSIS — I890 Lymphedema, not elsewhere classified: Secondary | ICD-10-CM | POA: Diagnosis not present

## 2019-12-24 NOTE — Progress Notes (Signed)
History of Present Illness  There is no documented history at this time  Assessments & Plan   There are no diagnoses linked to this encounter.    Additional instructions  Subjective:  Patient presents with venous ulcer of the Bilateral lower extremity.    Procedure:  3 layer unna wrap was placed Bilateral lower extremity.   Plan:   Follow up in one week.  

## 2019-12-28 ENCOUNTER — Encounter (INDEPENDENT_AMBULATORY_CARE_PROVIDER_SITE_OTHER): Payer: Self-pay | Admitting: Nurse Practitioner

## 2019-12-31 ENCOUNTER — Ambulatory Visit (INDEPENDENT_AMBULATORY_CARE_PROVIDER_SITE_OTHER): Payer: BC Managed Care – PPO | Admitting: Nurse Practitioner

## 2019-12-31 ENCOUNTER — Other Ambulatory Visit: Payer: Self-pay

## 2019-12-31 VITALS — BP 155/86 | HR 116 | Ht 62.0 in | Wt 372.0 lb

## 2019-12-31 DIAGNOSIS — I83009 Varicose veins of unspecified lower extremity with ulcer of unspecified site: Secondary | ICD-10-CM | POA: Diagnosis not present

## 2019-12-31 DIAGNOSIS — L97909 Non-pressure chronic ulcer of unspecified part of unspecified lower leg with unspecified severity: Secondary | ICD-10-CM

## 2019-12-31 NOTE — Progress Notes (Signed)
History of Present Illness  There is no documented history at this time  Assessments & Plan   There are no diagnoses linked to this encounter.    Additional instructions  Subjective:  Patient presents with venous ulcer of the Bilateral lower extremity.    Procedure:  3 layer unna wrap was placed Bilateral lower extremity.   Plan:   Follow up in one week.  

## 2020-01-01 ENCOUNTER — Encounter (INDEPENDENT_AMBULATORY_CARE_PROVIDER_SITE_OTHER): Payer: Self-pay | Admitting: Nurse Practitioner

## 2020-01-07 ENCOUNTER — Ambulatory Visit (INDEPENDENT_AMBULATORY_CARE_PROVIDER_SITE_OTHER): Payer: BC Managed Care – PPO | Admitting: Nurse Practitioner

## 2020-01-07 ENCOUNTER — Other Ambulatory Visit: Payer: Self-pay

## 2020-01-07 ENCOUNTER — Encounter (INDEPENDENT_AMBULATORY_CARE_PROVIDER_SITE_OTHER): Payer: Self-pay | Admitting: Nurse Practitioner

## 2020-01-07 VITALS — BP 176/91 | HR 102 | Ht 62.0 in | Wt 374.0 lb

## 2020-01-07 DIAGNOSIS — L03119 Cellulitis of unspecified part of limb: Secondary | ICD-10-CM | POA: Diagnosis not present

## 2020-01-07 DIAGNOSIS — L97909 Non-pressure chronic ulcer of unspecified part of unspecified lower leg with unspecified severity: Secondary | ICD-10-CM

## 2020-01-07 DIAGNOSIS — I890 Lymphedema, not elsewhere classified: Secondary | ICD-10-CM | POA: Diagnosis not present

## 2020-01-07 DIAGNOSIS — I83009 Varicose veins of unspecified lower extremity with ulcer of unspecified site: Secondary | ICD-10-CM

## 2020-01-07 MED ORDER — SULFAMETHOXAZOLE-TRIMETHOPRIM 800-160 MG PO TABS: 1.0000 | ORAL_TABLET | Freq: Two times a day (BID) | ORAL | 0 refills | Status: DC

## 2020-01-08 ENCOUNTER — Encounter (INDEPENDENT_AMBULATORY_CARE_PROVIDER_SITE_OTHER): Payer: Self-pay | Admitting: Nurse Practitioner

## 2020-01-08 DIAGNOSIS — J189 Pneumonia, unspecified organism: Secondary | ICD-10-CM | POA: Diagnosis not present

## 2020-01-08 NOTE — Progress Notes (Signed)
Subjective:    [name], female    DOB: 11/07/1960, 59 y.o.   MRN: [id number] Chief Complaint  Patient presents with  . Follow-up    4 week bil unna boot check     The patient presents today for follow-up evaluation of her bilateral lower extremity edema and venous ulcerations.  The patient has been in Unna wraps for approximately the last several weeks.  A check of her lower extremities a week after starting antibiotics it was noted that the cellulitis had resolved however today it has returned and is worse than previous.  The patient has some small areas of ulceration however her right lower extremity continues to have a larger area.  She also continues to have weeping in her bilateral lower extremities.  This is despite appropriate conservative therapy.  The patient is also leaking through her lower extremity wounds.  Patient denies any fever, chills, nausea, vomiting or diarrhea.  However she does note that the wound is painful as well.   Review of Systems  Respiratory: Positive for shortness of breath.   Cardiovascular: Positive for leg swelling.  Skin: Positive for color change, rash and wound.  All other systems reviewed and are negative.      Objective:   Physical Exam Vitals reviewed.  Constitutional:      Appearance: She is obese.  HENT:     Head: Normocephalic.  Cardiovascular:     Rate and Rhythm: Normal rate and regular rhythm.  Pulmonary:     Effort: Tachypnea present.  Musculoskeletal:     Right lower leg: 3+ Edema present.     Left lower leg: 3+ Edema present.  Neurological:     Mental Status: She is alert and oriented to person, place, and time.  Psychiatric:        Mood and Affect: Mood normal.        Behavior: Behavior normal.        Thought Content: Thought content normal.        Judgment: Judgment normal.     BP (!) 176/91   Pulse (!) 102   Ht 5\' 2"  (1.575 m)   Wt (!) 374 lb (169.6 kg)   BMI 68.41 kg/m   Past Medical History:   Diagnosis Date  . Abnormal mammogram, unspecified 2013  . Atrial fibrillation (HCC)    2013  . Breast screening, unspecified   . COPD (chronic obstructive pulmonary disease) (HCC)   . History of continuous positive airway pressure (CPAP) therapy at home   . Obesity, unspecified   . Screening for obesity   . Special screening for malignant neoplasms, colon   . Unspecified essential hypertension     Social History   Socioeconomic History  . Marital status: Divorced    Spouse name: Not on file  . Number of children: Not on file  . Years of education: Not on file  . Highest education level: Not on file  Occupational History  . Not on file  Tobacco Use  . Smoking status: Never Smoker  . Smokeless tobacco: Never Used  Vaping Use  . Vaping Use: Never used  Substance and Sexual Activity  . Alcohol use: No  . Drug use: No  . Sexual activity: Not on file  Other Topics Concern  . Not on file  Social History Narrative  . Not on file   Social Determinants of Health   Financial Resource Strain:   . Difficulty of Paying Living Expenses: Not  on file  Food Insecurity:   . Worried About Programme researcher, broadcasting/film/video in the Last Year: Not on file  . Ran Out of Food in the Last Year: Not on file  Transportation Needs:   . Lack of Transportation (Medical): Not on file  . Lack of Transportation (Non-Medical): Not on file  Physical Activity:   . Days of Exercise per Week: Not on file  . Minutes of Exercise per Session: Not on file  Stress:   . Feeling of Stress : Not on file  Social Connections:   . Frequency of Communication with Friends and Family: Not on file  . Frequency of Social Gatherings with Friends and Family: Not on file  . Attends Religious Services: Not on file  . Active Member of Clubs or Organizations: Not on file  . Attends Banker Meetings: Not on file  . Marital Status: Not on file  Intimate Partner Violence:   . Fear of Current or Ex-Partner: Not on file   . Emotionally Abused: Not on file  . Physically Abused: Not on file  . Sexually Abused: Not on file    Past Surgical History:  Procedure Laterality Date  . ABDOMINAL HYSTERECTOMY  2011  . ANTERIOR CRUCIATE LIGAMENT REPAIR  2003  . BREAST BIOPSY Left January 22, 2012   left breast stereotactic biopsy showed fibroadenomatous changes with microcalcifications, fat necrosis, and sclerosing adenosis and columnar cell changes. No evidence of atypia or malignancy.  Marland Kitchen CARDIOVERSION N/A 09/22/2016   Procedure: CARDIOVERSION;  Surgeon: Dalia Heading, MD;  Location: [facility];  Service: Cardiovascular;  Laterality: N/A;  . CARDIOVERSION N/A 01/17/2018   Procedure: CARDIOVERSION;  Surgeon: Dalia Heading, MD;  Location: [facility];  Service: Cardiovascular;  Laterality: N/A;  . CARDIOVERSION N/A 01/09/2019   Procedure: CARDIOVERSION;  Surgeon: Dalia Heading, MD;  Location: [facility];  Service: Cardiovascular;  Laterality: N/A;  . CESAREAN SECTION  1985, 1987, and 1998  . COLONOSCOPY  2011   Dr. Mechele Collin  . ELECTROPHYSIOLOGIC STUDY N/A 12/03/2014   Procedure: Cardioversion;  Surgeon: Dalia Heading, MD;  Location: [facility];  Service: Cardiovascular;  Laterality: N/A;  . ELECTROPHYSIOLOGIC STUDY N/A 04/01/2015   Procedure: Cardioversion;  Surgeon: Dalia Heading, MD;  Location: [facility];  Service: Cardiovascular;  Laterality: N/A;  . KNEE SURGERY  2013    Family History  Problem Relation Age of Onset  . Colon cancer Mother        diagnosis age 74's  . Colon cancer Sister        diagnosis age 53  . Atrial fibrillation Sister   . Breast cancer Other        Maternal Grandmother; diagnosis age 51's  . Lung cancer Other        Paternal Grandmother; diagnosis age 75's  . Colon cancer Other        Paternal Grandfather; diagnosis age 70's  . Heart disease Father     Allergies  Allergen Reactions  . Amiodarone     Caused toxicity        Assessment & Plan:   1. Recurrent cellulitis of lower  extremity The patient has recurrent cellulitis of the right lower extremity despite being in wraps.  She also continues to have drainage.  The patient has had several recurrent episodes of cellulitis despite appropriate management.  We will do a longer duration of antibiotics for treatment of her cellulitis today.  However if it continues to recur after healing,  we may discuss a prophylactic dose to keep the patient from developing recurrent cellulitis. - sulfamethoxazole-trimethoprim (BACTRIM DS) 800-160 MG tablet; Take 1 tablet by mouth 2 (two) times daily.  Dispense: 28 tablet; Refill: 0  2. Venous ulcer (HCC) We will continue to place the patient in bilateral Unna wraps.  The patient also continues to have weeping of her bilateral lower extremities.  I have advised the patient to increase her Lasix dose for the next week to see if this also helps with weeping and edema.  The patient has a follow-up soon with her cardiologist and it may be helpful to determine if she has some fluid overload affecting her continued weeping.  3. Lymphedema  The patient will continue with other conservative tactics such as elevation of her lower extremities.  Currently the patient severe shortness of breath will make it difficult for her to exercise.  We will continue to monitor the patient for progress on a weekly basis by changing her own wraps.  In 4 weeks we will reevaluate the patient's progress.  Current Outpatient Medications on File Prior to Visit  Medication Sig Dispense Refill  . albuterol (VENTOLIN HFA) 108 (90 Base) MCG/ACT inhaler Inhale 2 puffs into the lungs every 6 (six) hours as needed for wheezing. 8 g 5  . amLODipine (NORVASC) 5 MG tablet Take 1 tablet (5 mg total) by mouth daily. 14 tablet 0  . apixaban (ELIQUIS) 5 MG TABS tablet Take 5 mg by mouth 2 (two) times daily.     . cyanocobalamin 2000 MCG tablet Take 2,000 mcg by mouth daily.     Marland Kitchen diltiazem (CARDIZEM CD) 240 MG 24 hr capsule Take 240  mg by mouth daily after breakfast.    . diltiazem (TIAZAC) 240 MG 24 hr capsule TAKE 1 CAPSULE (240 MG TOTAL) BY MOUTH ONCE DAILY.    . furosemide (LASIX) 40 MG tablet Take 40 mg by mouth daily.  5  . levothyroxine (SYNTHROID) 75 MCG tablet Take 1 tablet (75 mcg total) by mouth daily before breakfast. Will need thyroid lab and follow-up for further refills 90 tablet 0  . OXYGEN Inhale 2 L/L into the lungs as needed.     . potassium chloride SA (K-DUR,KLOR-CON) 20 MEQ tablet Take 1 tablet (20 mEq total) by mouth daily. (Patient taking differently: Take 20 mEq by mouth at bedtime. ) 30 tablet 6  . predniSONE (DELTASONE) 10 MG tablet TAKE 1 TABLET (10 MG TOTAL) BY MOUTH DAILY WITH BREAKFAST. 30 tablet 5  . predniSONE (DELTASONE) 5 MG tablet TAKE 1 TABLET BY MOUTH EVERY DAY WITH BREAKFAST 30 tablet 1  . Spacer/Aero-Holding Chambers DEVI 1 Device by Does not apply route as needed. 1 each 0  . traMADol (ULTRAM) 50 MG tablet Take 50 mg by mouth 3 (three) times daily as needed.    . TRELEGY ELLIPTA 100-62.5-25 MCG/INH AEPB INHALE 1 INHALER INTO THE LUNGS DAILY. 180 each 4   No current facility-administered medications on file prior to visit.    There are no Patient Instructions on file for this visit. No follow-ups on file.   Georgiana Spinner, NP

## 2020-01-14 ENCOUNTER — Other Ambulatory Visit: Payer: Self-pay

## 2020-01-14 ENCOUNTER — Ambulatory Visit (INDEPENDENT_AMBULATORY_CARE_PROVIDER_SITE_OTHER): Payer: BC Managed Care – PPO | Admitting: Nurse Practitioner

## 2020-01-14 ENCOUNTER — Encounter (INDEPENDENT_AMBULATORY_CARE_PROVIDER_SITE_OTHER): Payer: Self-pay | Admitting: Nurse Practitioner

## 2020-01-14 VITALS — BP 169/89 | HR 76 | Ht 62.0 in | Wt 367.0 lb

## 2020-01-14 DIAGNOSIS — L97909 Non-pressure chronic ulcer of unspecified part of unspecified lower leg with unspecified severity: Secondary | ICD-10-CM

## 2020-01-14 DIAGNOSIS — I83009 Varicose veins of unspecified lower extremity with ulcer of unspecified site: Secondary | ICD-10-CM | POA: Diagnosis not present

## 2020-01-14 NOTE — Progress Notes (Signed)
History of Present Illness  There is no documented history at this time  Assessments & Plan   There are no diagnoses linked to this encounter.    Additional instructions  Subjective:  Patient presents with venous ulcer of the Bilateral lower extremity.    Procedure:  3 layer unna wrap was placed Bilateral lower extremity.   Plan:   Follow up in one week.  

## 2020-01-17 ENCOUNTER — Other Ambulatory Visit: Payer: Self-pay | Admitting: Internal Medicine

## 2020-01-17 NOTE — Telephone Encounter (Signed)
Patient is requesting refill on prednisone 5mg . Patient last seen 06/17/2019 and was instructed to continue prednisone for 27mo. Patient has pending OV with Dr. 0mo on 01/22/2020.  Dr. 01/24/2020, would you like for patient to continue prednisone?

## 2020-01-19 NOTE — Telephone Encounter (Signed)
Lm for patient.  

## 2020-01-19 NOTE — Telephone Encounter (Signed)
Let's stop prednisone for now and we will discuss together at upcoming visit. Thanks!

## 2020-01-20 NOTE — Telephone Encounter (Signed)
Lm x2 for patient.  Letter has been mailed to address on file.   

## 2020-01-21 ENCOUNTER — Encounter (INDEPENDENT_AMBULATORY_CARE_PROVIDER_SITE_OTHER): Payer: Self-pay

## 2020-01-21 ENCOUNTER — Ambulatory Visit (INDEPENDENT_AMBULATORY_CARE_PROVIDER_SITE_OTHER): Payer: BC Managed Care – PPO | Admitting: Nurse Practitioner

## 2020-01-21 ENCOUNTER — Other Ambulatory Visit: Payer: Self-pay

## 2020-01-21 VITALS — BP 141/86 | HR 103 | Resp 16 | Wt 367.4 lb

## 2020-01-21 DIAGNOSIS — L97909 Non-pressure chronic ulcer of unspecified part of unspecified lower leg with unspecified severity: Secondary | ICD-10-CM | POA: Diagnosis not present

## 2020-01-21 DIAGNOSIS — I83009 Varicose veins of unspecified lower extremity with ulcer of unspecified site: Secondary | ICD-10-CM | POA: Diagnosis not present

## 2020-01-21 NOTE — Progress Notes (Signed)
History of Present Illness  There is no documented history at this time  Assessments & Plan   There are no diagnoses linked to this encounter.    Additional instructions  Subjective:  Patient presents with venous ulcer of the Bilateral lower extremity.    Procedure:  3 layer unna wrap was placed Bilateral lower extremity.   Plan:   Follow up in one week.  

## 2020-01-22 ENCOUNTER — Other Ambulatory Visit: Payer: Self-pay

## 2020-01-22 ENCOUNTER — Ambulatory Visit (INDEPENDENT_AMBULATORY_CARE_PROVIDER_SITE_OTHER): Payer: BC Managed Care – PPO | Admitting: Pulmonary Disease

## 2020-01-22 ENCOUNTER — Encounter: Payer: Self-pay | Admitting: Pulmonary Disease

## 2020-01-22 VITALS — BP 148/92 | HR 109 | Temp 98.8°F | Ht 62.0 in | Wt 369.6 lb

## 2020-01-22 DIAGNOSIS — R0600 Dyspnea, unspecified: Secondary | ICD-10-CM

## 2020-01-22 DIAGNOSIS — R0609 Other forms of dyspnea: Secondary | ICD-10-CM

## 2020-01-22 DIAGNOSIS — G4733 Obstructive sleep apnea (adult) (pediatric): Secondary | ICD-10-CM

## 2020-01-22 DIAGNOSIS — J4530 Mild persistent asthma, uncomplicated: Secondary | ICD-10-CM | POA: Diagnosis not present

## 2020-01-22 DIAGNOSIS — I480 Paroxysmal atrial fibrillation: Secondary | ICD-10-CM | POA: Diagnosis not present

## 2020-01-22 MED ORDER — TRELEGY ELLIPTA 100-62.5-25 MCG/INH IN AEPB: 1.0000 | INHALATION_SPRAY | Freq: Every day | RESPIRATORY_TRACT | 4 refills | Status: DC

## 2020-01-22 NOTE — Patient Instructions (Addendum)
Nice to meet you!  It is ok to have cardioversion.  CT scan shows small area in bottom of lungs that could be scar from amiodarone or sticky air sacks called atelectasis.   Stop prednisone.  I refilled the trelegy - I hope this helps reduce the need for albuterol!   Follow up with Dr. Judeth Horn in 6 months

## 2020-01-22 NOTE — Progress Notes (Signed)
Visit done over phone due to COVID pandemic with patient's permission.  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   PFTs reassuring against instrinsic lung disease given preserved DLCO/VA. Restrictive process related to obesity.  CT high res with dependent linear opacity bilateral base - favor atelectasis based on PFTs, no prone imaging to further characterize atelectasis vs scar/fibrosis.  - d/c prednisone - CT hi-res in 2 months to evaluate for any fibrotic changes to reassure me and patient about long-term sequelae - O2 at night bled into CPAP and with exertion during day  Chronic hypercapnea secondary to OSA and OHS, restrictive lung disease- confirmed with ABG 02/03/19 as well as chronic compensatory metabolic alkalosis, PFTs - Continue CPAP  Afib- no contraindication to cardioversion, recommend patient recover from sedation with CPAP given know OSA/OHS. I do not clear for procedure but provide risk assessment. No reliable risk assessment tool for moderate sedation. Her OHS/OSA places her at higher risk of desaturation during procedure. Use NIPPV during procedure if able and should definitely recover on NIPPV.  DOE-stable to improved.  Likely multifactorial and largely driven by OHS.  Atrial fibrillation likely contributing to symptoms.  She says that Trelegy helped in the past and has been using albuterol at least daily since around trilogy some months ago.  Possible asthma.  Refill Trelegy today.  Hopefully this reduces need for rescue inhaler  MDM . I reviewed prior pulmonary notes . I reviewed the result(s) of PFTs 05/23/19 showing no obstruction, mild-moderate restriction, and preserved DLCO all related to weight . I reviewed CT high res  Independent interpretation of tests . Review of patient's CT chest 08/2019 with some  mosaicism, bilateral dependant basilar linear opacity favored to represent atelectasis vs scar from amiodarone toxicity     Subjective:   [name] GENDER: female DOB: 1960-04-12, MRN: [id number]  Chief Complaint  Patient presents with  . Follow-up    wheezing and shortness of breath all the time. 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 Overall she is doing okay.  Breathing is stable.  She is continues on low-dose prednisone despite prior instructions to stop some months ago.  Unfortunately, she has come back into atrial fibrillation with intermittent RVR.  There is plans for cardioversion in the coming weeks per outside cardiologist in [place].  Sounds like they want to make sure this would be okay from a pulmonary standpoint.  She did stop the Trelegy some months ago.  She says she should have asked for back in March but just forgot to.  She is using albuterol inhaler at least once a day.  59 year old woman with a history of atrial fibrillation on anticoagulation presenting for recurrent admissions for "pneumonia."  Pretty clear correlation with starting amiodarone. Dyspnea, cough, congestion after starting Diagnosed with bilateral pneumonia requiring 2 admissions stopped on med, slowly getting better but not back to baseline [facility] 2 Some lingering inflammation on CXR for which she is referred to pulm  Has CPAP, using, no one monitoring download  Afib is asymptomatic for most part    Objective:  GEN: no acute distress CV: irregular, no murmur PULM: Diminished throughout, NWOB on RA GI: Soft +BS EXT: wrapped in ACE bandage  Vitals:   01/22/20 1520  BP: (!) 148/92  Pulse: (!) 109  Temp: 98.8 F (37.1 C)  TempSrc: Oral  SpO2: 92%  Weight: (!) 369 lb 9.6 oz (167.6 kg)  Height: 5\' 2"  (1.575 m)   92% on RA BMI Readings from Last 3  Encounters:  01/22/20 67.60 kg/m  01/21/20 67.20 kg/m  01/14/20 67.13 kg/m   Wt Readings from Last 3 Encounters:  01/22/20 (!) 369 lb 9.6 oz (167.6 kg)  01/21/20 (!) 367 lb 6.4 oz (166.7 kg)  01/14/20 (!) 367 lb (166.5 kg)     Ancillary Information    Past Medical History:  Diagnosis Date  . Abnormal mammogram, unspecified 2013  . Atrial fibrillation (HCC)    2013  . Breast screening, unspecified   . COPD (chronic obstructive pulmonary disease) (HCC)   . History of continuous positive airway pressure (CPAP) therapy at home   . Obesity, unspecified   . Screening for obesity   . Special screening for malignant neoplasms, colon   . Unspecified essential hypertension      Family History  Problem Relation Age of Onset  . Colon cancer Mother        diagnosis age 15's  . Colon cancer Sister        diagnosis age 20  . Atrial fibrillation Sister   . Breast cancer Other        Maternal Grandmother; diagnosis age 38's  . Lung cancer Other        Paternal Grandmother; diagnosis age 43's  . Colon cancer Other        Paternal Grandfather; diagnosis age 69's  . Heart disease Father      Past Surgical History:  Procedure Laterality Date  . ABDOMINAL HYSTERECTOMY  2011  . ANTERIOR CRUCIATE LIGAMENT REPAIR  2003  . BREAST BIOPSY Left January 22, 2012   left breast stereotactic biopsy showed fibroadenomatous changes with microcalcifications, fat necrosis, and sclerosing adenosis and columnar cell changes. No evidence of atypia or malignancy.  January 24, 2012 CARDIOVERSION N/A 09/22/2016   Procedure: CARDIOVERSION;  Surgeon: 09/24/2016, MD;  Location: [facility];  Service: Cardiovascular;  Laterality: N/A;  . CARDIOVERSION N/A 01/17/2018   Procedure: CARDIOVERSION;  Surgeon: 03/19/2018, MD;  Location: [facility];  Service: Cardiovascular;  Laterality: N/A;  . CARDIOVERSION N/A 01/09/2019   Procedure: CARDIOVERSION;  Surgeon: 03/11/2019, MD;  Location: [facility];  Service:  Cardiovascular;  Laterality: N/A;  . CESAREAN SECTION  1985, 1987, and 1998  . COLONOSCOPY  2011   Dr. 2012  . ELECTROPHYSIOLOGIC STUDY N/A 12/03/2014   Procedure: Cardioversion;  Surgeon: 12/05/2014, MD;  Location: [facility];  Service: Cardiovascular;  Laterality: N/A;  . ELECTROPHYSIOLOGIC STUDY N/A 04/01/2015   Procedure: Cardioversion;  Surgeon: 04/03/2015, MD;  Location: [facility];  Service: Cardiovascular;  Laterality: N/A;  . KNEE SURGERY  2013    Social History   Socioeconomic History  . Marital status: Divorced    Spouse name: Not on file  . Number of children: Not on file  . Years of education: Not on file  . Highest education level: Not on file  Occupational History  . Not on file  Tobacco Use  . Smoking status: Never Smoker  . Smokeless tobacco: Never Used  Substance and Sexual Activity  . Alcohol use: No  . Drug use: No  . Sexual activity: Not on file  Other Topics Concern  . Not on file  Social History  Narrative  . Not on file   Social Determinants of Health   Financial Resource Strain:   . Difficulty of Paying Living Expenses: Not on file  Food Insecurity:   . Worried About Programme researcher, broadcasting/film/video in the Last Year: Not on file  . Ran Out of Food in the Last Year: Not on file  Transportation Needs:   . Lack of Transportation (Medical): Not on file  . Lack of Transportation (Non-Medical): Not on file  Physical Activity:   . Days of Exercise per Week: Not on file  . Minutes of Exercise per Session: Not on file  Stress:   . Feeling of Stress : Not on file  Social Connections:   . Frequency of Communication with Friends and Family: Not on file  . Frequency of Social Gatherings with Friends and Family: Not on file  . Attends Religious Services: Not on file  . Active Member of Clubs or Organizations: Not on file  . Attends Banker Meetings: Not on file  . Marital Status: Not on file  Intimate Partner Violence:   . Fear of Current or  Ex-Partner: Not on file  . Emotionally Abused: Not on file  . Physically Abused: Not on file  . Sexually Abused: Not on file     No Known Allergies   Outpatient Medications Prior to Visit  Medication Sig Dispense Refill  . albuterol (VENTOLIN HFA) 108 (90 Base) MCG/ACT inhaler Inhale 2 puffs into the lungs every 6 (six) hours as needed for wheezing.    Marland Kitchen apixaban (ELIQUIS) 5 MG TABS tablet Take 5 mg by mouth 2 (two) times daily.     . cyanocobalamin 2000 MCG tablet Take 2,000 mcg by mouth daily.    Marland Kitchen diltiazem (CARDIZEM CD) 240 MG 24 hr capsule Take 240 mg by mouth daily after breakfast.    . Fluticasone-Umeclidin-Vilant (TRELEGY ELLIPTA) 100-62.5-25 MCG/INH AEPB Inhale 1 Inhaler into the lungs daily. 30 each 11  . furosemide (LASIX) 40 MG tablet Take 40 mg by mouth daily.  5  . levothyroxine (SYNTHROID) 75 MCG tablet TAKE 1 TABLET (75 MCG TOTAL) BY MOUTH DAILY BEFORE BREAKFAST. 90 tablet 1  . OXYGEN Inhale 2 L/L into the lungs as needed.     . potassium chloride SA (K-DUR,KLOR-CON) 20 MEQ tablet Take 1 tablet (20 mEq total) by mouth daily. (Patient taking differently: Take 20 mEq by mouth at bedtime. ) 30 tablet 6  . Spacer/Aero-Holding Chambers DEVI 1 Device by Does not apply route as needed. 1 each 0   No facility-administered medications prior to visit.     CBC    Component Value Date/Time   WBC 10.4 07/09/2019 1658   RBC 4.71 07/09/2019 1658   HGB 13.2 07/09/2019 1658   HGB 9.8 (L) 11/29/2011 0432   HCT 42.9 07/09/2019 1658   HCT 38.7 11/13/2011 1448   PLT 181 07/09/2019 1658   PLT 119 (L) 11/29/2011 0432   MCV 91.1 07/09/2019 1658   MCV 82 11/13/2011 1448   MCH 28.0 07/09/2019 1658   MCHC 30.8 07/09/2019 1658   RDW 15.0 07/09/2019 1658   RDW 14.0 11/13/2011 1448   LYMPHSABS 0.8 03/20/2019 1619   MONOABS 0.5 03/20/2019 1619   EOSABS 0.1 03/20/2019 1619   BASOSABS 0.1 03/20/2019 1619      Current Outpatient Medications:  .  albuterol (VENTOLIN HFA) 108 (90 Base)  MCG/ACT inhaler, Inhale 2 puffs into the lungs every 6 (six) hours as needed for wheezing., Disp:  8 g, Rfl: 5 .  amLODipine (NORVASC) 5 MG tablet, Take 1 tablet (5 mg total) by mouth daily., Disp: 14 tablet, Rfl: 0 .  apixaban (ELIQUIS) 5 MG TABS tablet, Take 5 mg by mouth 2 (two) times daily. , Disp: , Rfl:  .  cyanocobalamin 2000 MCG tablet, Take 2,000 mcg by mouth daily. , Disp: , Rfl:  .  diltiazem (CARDIZEM CD) 240 MG 24 hr capsule, Take 240 mg by mouth daily after breakfast., Disp: , Rfl:  .  diltiazem (TIAZAC) 240 MG 24 hr capsule, TAKE 1 CAPSULE (240 MG TOTAL) BY MOUTH ONCE DAILY., Disp: , Rfl:  .  furosemide (LASIX) 40 MG tablet, Take 40 mg by mouth daily., Disp: , Rfl: 5 .  levothyroxine (SYNTHROID) 75 MCG tablet, Take 1 tablet (75 mcg total) by mouth daily before breakfast. Will need thyroid lab and follow-up for further refills, Disp: 90 tablet, Rfl: 0 .  OXYGEN, Inhale 2 L/L into the lungs as needed. , Disp: , Rfl:  .  potassium chloride SA (K-DUR,KLOR-CON) 20 MEQ tablet, Take 1 tablet (20 mEq total) by mouth daily. (Patient taking differently: Take 20 mEq by mouth at bedtime. ), Disp: 30 tablet, Rfl: 6 .  predniSONE (DELTASONE) 10 MG tablet, TAKE 1 TABLET (10 MG TOTAL) BY MOUTH DAILY WITH BREAKFAST., Disp: 30 tablet, Rfl: 5 .  Spacer/Aero-Holding Chambers DEVI, 1 Device by Does not apply route as needed., Disp: 1 each, Rfl: 0 .  sulfamethoxazole-trimethoprim (BACTRIM DS) 800-160 MG tablet, Take 1 tablet by mouth 2 (two) times daily., Disp: 28 tablet, Rfl: 0 .  traMADol (ULTRAM) 50 MG tablet, Take 50 mg by mouth 3 (three) times daily as needed., Disp: , Rfl:  .  TRELEGY ELLIPTA 100-62.5-25 MCG/INH AEPB, INHALE 1 INHALER INTO THE LUNGS DAILY. (Patient not taking: Reported on 01/22/2020), Disp: 180 each, Rfl: 4   Karren Burly, MD [facility] 01/22/2020 3:40 PM

## 2020-01-23 ENCOUNTER — Encounter (INDEPENDENT_AMBULATORY_CARE_PROVIDER_SITE_OTHER): Payer: Self-pay | Admitting: Nurse Practitioner

## 2020-01-28 ENCOUNTER — Ambulatory Visit (INDEPENDENT_AMBULATORY_CARE_PROVIDER_SITE_OTHER): Payer: BC Managed Care – PPO | Admitting: Nurse Practitioner

## 2020-01-28 ENCOUNTER — Other Ambulatory Visit: Payer: Self-pay

## 2020-01-28 ENCOUNTER — Encounter (INDEPENDENT_AMBULATORY_CARE_PROVIDER_SITE_OTHER): Payer: Self-pay | Admitting: Nurse Practitioner

## 2020-01-28 VITALS — BP 135/84 | HR 112 | Ht 62.0 in | Wt 372.0 lb

## 2020-01-28 DIAGNOSIS — L97909 Non-pressure chronic ulcer of unspecified part of unspecified lower leg with unspecified severity: Secondary | ICD-10-CM | POA: Diagnosis not present

## 2020-01-28 DIAGNOSIS — I890 Lymphedema, not elsewhere classified: Secondary | ICD-10-CM | POA: Diagnosis not present

## 2020-01-28 DIAGNOSIS — I83009 Varicose veins of unspecified lower extremity with ulcer of unspecified site: Secondary | ICD-10-CM | POA: Diagnosis not present

## 2020-01-28 DIAGNOSIS — L03119 Cellulitis of unspecified part of limb: Secondary | ICD-10-CM | POA: Diagnosis not present

## 2020-02-02 ENCOUNTER — Encounter (INDEPENDENT_AMBULATORY_CARE_PROVIDER_SITE_OTHER): Payer: Self-pay | Admitting: Nurse Practitioner

## 2020-02-02 NOTE — Progress Notes (Signed)
Subjective:    [name], female    DOB: [birth year], 59 y.o.   MRN: [id number] Chief Complaint  Patient presents with  . Follow-up    4 week bil unna boot check    Patient returns today for bilateral lower extremity Unna wrap evaluation.  Today the patient's lower extremity wounds and ulcerations have completely resolved.  Her cellulitis is also resolved as well.  The patient's bilateral lower extremities look much improved from previous.  The patient denies any fever, chills, nausea, vomiting or diarrhea.   Review of Systems  Cardiovascular: Positive for leg swelling.  Skin: Negative for wound.  All other systems reviewed and are negative.      Objective:   Physical Exam Vitals reviewed.  HENT:     Head: Normocephalic.  Cardiovascular:     Rate and Rhythm: Normal rate.     Pulses: Normal pulses.  Pulmonary:     Effort: Pulmonary effort is normal.  Skin:    General: Skin is warm and dry.  Neurological:     Mental Status: She is alert and oriented to person, place, and time.  Psychiatric:        Mood and Affect: Mood normal.        Behavior: Behavior normal.        Thought Content: Thought content normal.        Judgment: Judgment normal.     BP 135/84   Pulse (!) 112   Ht 5\' 2"  (1.575 m)   Wt (!) 372 lb (168.7 kg)   BMI 68.04 kg/m   Past Medical History:  Diagnosis Date  . Abnormal mammogram, unspecified 2013  . Atrial fibrillation (HCC)    2013  . Breast screening, unspecified   . COPD (chronic obstructive pulmonary disease) (HCC)   . History of continuous positive airway pressure (CPAP) therapy at home   . Hypothyroidism   . Obesity, unspecified   . Pneumonia   . Screening for obesity   . Sleep apnea   . Special screening for malignant neoplasms, colon   . Unspecified essential hypertension     Social History   Socioeconomic History  . Marital status: Divorced    Spouse name: Not on file  . Number of children: Not on file  .  Years of education: Not on file  . Highest education level: Not on file  Occupational History  . Not on file  Tobacco Use  . Smoking status: Passive Smoke Exposure - Never Smoker  . Smokeless tobacco: Never Used  Vaping Use  . Vaping Use: Never used  Substance and Sexual Activity  . Alcohol use: No  . Drug use: No  . Sexual activity: Not on file  Other Topics Concern  . Not on file  Social History Narrative  . Not on file   Social Determinants of Health   Financial Resource Strain:   . Difficulty of Paying Living Expenses: Not on file  Food Insecurity:   . Worried About 2014 in the Last Year: Not on file  . Ran Out of Food in the Last Year: Not on file  Transportation Needs:   . Lack of Transportation (Medical): Not on file  . Lack of Transportation (Non-Medical): Not on file  Physical Activity:   . Days of Exercise per Week: Not on file  . Minutes of Exercise per Session: Not on file  Stress:   . Feeling of Stress : Not on file  Social Connections:   . Frequency of Communication with Friends and Family: Not on file  . Frequency of Social Gatherings with Friends and Family: Not on file  . Attends Religious Services: Not on file  . Active Member of Clubs or Organizations: Not on file  . Attends Banker Meetings: Not on file  . Marital Status: Not on file  Intimate Partner Violence:   . Fear of Current or Ex-Partner: Not on file  . Emotionally Abused: Not on file  . Physically Abused: Not on file  . Sexually Abused: Not on file    Past Surgical History:  Procedure Laterality Date  . ABDOMINAL HYSTERECTOMY  2011  . ANTERIOR CRUCIATE LIGAMENT REPAIR  2003  . BREAST BIOPSY Left January 22, 2012   left breast stereotactic biopsy showed fibroadenomatous changes with microcalcifications, fat necrosis, and sclerosing adenosis and columnar cell changes. No evidence of atypia or malignancy.  Marland Kitchen CARDIOVERSION N/A 09/22/2016   Procedure:  CARDIOVERSION;  Surgeon: Dalia Heading, MD;  Location: [facility] ORS;  Service: Cardiovascular;  Laterality: N/A;  . CARDIOVERSION N/A 01/17/2018   Procedure: CARDIOVERSION;  Surgeon: Dalia Heading, MD;  Location: [facility];  Service: Cardiovascular;  Laterality: N/A;  . CARDIOVERSION N/A 01/09/2019   Procedure: CARDIOVERSION;  Surgeon: Dalia Heading, MD;  Location: [facility] OR[facility];  Service: Cardiovascular;  Laterality: N/A;  . CESAREAN SECTION  1985, 1987, and 1998  . COLONOSCOPY  2011   Dr. Mechele Collin  . ELECTROPHYSIOLOGIC STUDY N/A 12/03/2014   Procedure: Cardioversion;  Surgeon: Dalia Heading, MD;  Location: [facility];  Service: Cardiovascular;  Laterality: N/A;  . ELECTROPHYSIOLOGIC STUDY N/A 04/01/2015   Procedure: Cardioversion;  Surgeon: Dalia Heading, MD;  Location: [facility];  Service: Cardiovascular;  Laterality: N/A;  . KNEE SURGERY  2013    Family History  Problem Relation Age of Onset  . Colon cancer Mother        diagnosis age 67's  . Colon cancer Sister        diagnosis age 63  . Atrial fibrillation Sister   . Breast cancer Other        Maternal Grandmother; diagnosis age 35's  . Lung cancer Other        Paternal Grandmother; diagnosis age 71's  . Colon cancer Other        Paternal Grandfather; diagnosis age 66's  . Heart disease Father     Allergies  Allergen Reactions  . Amiodarone     Caused toxicity        Assessment & Plan:   1. Lymphedema Recommend:  No surgery or intervention at this point in time.    I have reviewed my previous discussion with the patient regarding swelling and why it causes symptoms.  Patient will continue wearing graduated compression stockings class 1 (20-30 mmHg) on a daily basis. The patient will  beginning wearing the stockings first thing in the morning and removing them in the evening. The patient is instructed specifically not to sleep in the stockings.    In addition, behavioral modification including several periods of elevation  of the lower extremities during the day will be continued.  This was reviewed with the patient during the initial visit.  The patient will also continue routine exercise, especially walking on a daily basis as was discussed during the initial visit.    We will have the patient return to the office in 6 months, however sooner if issues should arise.   2. Venous  ulcer (HCC) This is completely resolved at this time.  3. Recurrent cellulitis of lower extremity This is resolved.  Patient tolerated Bactrim well.  Discussed with the patient if we noticed that there are further recurrent episodes of cellulitis we may discuss a prophylactic dose.   Current Outpatient Medications on File Prior to Visit  Medication Sig Dispense Refill  . albuterol (VENTOLIN HFA) 108 (90 Base) MCG/ACT inhaler Inhale 2 puffs into the lungs every 6 (six) hours as needed for wheezing. 8 g 5  . amLODipine (NORVASC) 5 MG tablet Take 1 tablet (5 mg total) by mouth daily. 14 tablet 0  . apixaban (ELIQUIS) 5 MG TABS tablet Take 5 mg by mouth 2 (two) times daily.     . cyanocobalamin 2000 MCG tablet Take 2,000 mcg by mouth daily.     Marland Kitchen diltiazem (CARDIZEM CD) 240 MG 24 hr capsule Take 240 mg by mouth daily after breakfast.    . diltiazem (TIAZAC) 240 MG 24 hr capsule TAKE 1 CAPSULE (240 MG TOTAL) BY MOUTH ONCE DAILY.    Marland Kitchen Fluticasone-Umeclidin-Vilant (TRELEGY ELLIPTA) 100-62.5-25 MCG/INH AEPB Inhale 1 Inhaler into the lungs daily. 180 each 4  . furosemide (LASIX) 40 MG tablet Take 40 mg by mouth daily.  5  . levothyroxine (SYNTHROID) 75 MCG tablet Take 1 tablet (75 mcg total) by mouth daily before breakfast. Will need thyroid lab and follow-up for further refills 90 tablet 0  . OXYGEN Inhale 2 L/L into the lungs as needed.     . potassium chloride SA (K-DUR,KLOR-CON) 20 MEQ tablet Take 1 tablet (20 mEq total) by mouth daily. (Patient taking differently: Take 20 mEq by mouth at bedtime. ) 30 tablet 6  . Spacer/Aero-Holding  Chambers DEVI 1 Device by Does not apply route as needed. 1 each 0  . sulfamethoxazole-trimethoprim (BACTRIM DS) 800-160 MG tablet Take 1 tablet by mouth 2 (two) times daily. 28 tablet 0  . traMADol (ULTRAM) 50 MG tablet Take 50 mg by mouth 3 (three) times daily as needed.    . predniSONE (DELTASONE) 10 MG tablet TAKE 1 TABLET (10 MG TOTAL) BY MOUTH DAILY WITH BREAKFAST. (Patient not taking: Reported on 01/28/2020) 30 tablet 5   No current facility-administered medications on file prior to visit.    There are no Patient Instructions on file for this visit. No follow-ups on file.   Georgiana Spinner, NP

## 2020-02-03 ENCOUNTER — Ambulatory Visit (INDEPENDENT_AMBULATORY_CARE_PROVIDER_SITE_OTHER): Payer: BC Managed Care – PPO | Admitting: Vascular Surgery

## 2020-02-03 ENCOUNTER — Other Ambulatory Visit: Payer: Self-pay | Admitting: Family Medicine

## 2020-02-03 DIAGNOSIS — E039 Hypothyroidism, unspecified: Secondary | ICD-10-CM

## 2020-02-04 ENCOUNTER — Telehealth (INDEPENDENT_AMBULATORY_CARE_PROVIDER_SITE_OTHER): Payer: Self-pay

## 2020-02-04 ENCOUNTER — Other Ambulatory Visit (INDEPENDENT_AMBULATORY_CARE_PROVIDER_SITE_OTHER): Payer: Self-pay | Admitting: Nurse Practitioner

## 2020-02-04 DIAGNOSIS — L03119 Cellulitis of unspecified part of limb: Secondary | ICD-10-CM

## 2020-02-04 MED ORDER — SULFAMETHOXAZOLE-TRIMETHOPRIM 800-160 MG PO TABS: 1.0000 | ORAL_TABLET | Freq: Two times a day (BID) | ORAL | 0 refills | Status: DC

## 2020-02-04 NOTE — Telephone Encounter (Signed)
We can do antibiotics and I have sent in a week refill , however if the redness and weeping persists after she finishes antibiotics she will likely need to be seen and may need to go back into wraps.  She should be sure that she is wearing compression daily as well

## 2020-02-04 NOTE — Telephone Encounter (Signed)
Patient was made aware with medical advice and verbalized understanding 

## 2020-02-12 ENCOUNTER — Emergency Department: Payer: BC Managed Care – PPO

## 2020-02-12 ENCOUNTER — Telehealth: Payer: Self-pay | Admitting: Pulmonary Disease

## 2020-02-12 ENCOUNTER — Inpatient Hospital Stay
Admission: EM | Admit: 2020-02-12 | Discharge: 2020-02-20 | DRG: 291 | Disposition: A | Discharge status: Home Health | Payer: BC Managed Care – PPO | Attending: Student | Admitting: Student

## 2020-02-12 ENCOUNTER — Telehealth (INDEPENDENT_AMBULATORY_CARE_PROVIDER_SITE_OTHER): Payer: BC Managed Care – PPO | Admitting: Family Medicine

## 2020-02-12 ENCOUNTER — Encounter: Payer: Self-pay | Admitting: Family Medicine

## 2020-02-12 ENCOUNTER — Other Ambulatory Visit: Payer: Self-pay

## 2020-02-12 DIAGNOSIS — I5043 Acute on chronic combined systolic (congestive) and diastolic (congestive) heart failure: Secondary | ICD-10-CM | POA: Diagnosis not present

## 2020-02-12 DIAGNOSIS — E662 Morbid (severe) obesity with alveolar hypoventilation: Secondary | ICD-10-CM | POA: Diagnosis present

## 2020-02-12 DIAGNOSIS — Z7951 Long term (current) use of inhaled steroids: Secondary | ICD-10-CM

## 2020-02-12 DIAGNOSIS — I272 Pulmonary hypertension, unspecified: Secondary | ICD-10-CM | POA: Diagnosis not present

## 2020-02-12 DIAGNOSIS — J9621 Acute and chronic respiratory failure with hypoxia: Secondary | ICD-10-CM | POA: Diagnosis present

## 2020-02-12 DIAGNOSIS — J849 Interstitial pulmonary disease, unspecified: Secondary | ICD-10-CM | POA: Diagnosis not present

## 2020-02-12 DIAGNOSIS — J810 Acute pulmonary edema: Secondary | ICD-10-CM

## 2020-02-12 DIAGNOSIS — J441 Chronic obstructive pulmonary disease with (acute) exacerbation: Secondary | ICD-10-CM | POA: Diagnosis not present

## 2020-02-12 DIAGNOSIS — I4891 Unspecified atrial fibrillation: Secondary | ICD-10-CM | POA: Diagnosis not present

## 2020-02-12 DIAGNOSIS — I480 Paroxysmal atrial fibrillation: Secondary | ICD-10-CM | POA: Diagnosis not present

## 2020-02-12 DIAGNOSIS — I509 Heart failure, unspecified: Secondary | ICD-10-CM | POA: Diagnosis not present

## 2020-02-12 DIAGNOSIS — J9601 Acute respiratory failure with hypoxia: Secondary | ICD-10-CM

## 2020-02-12 DIAGNOSIS — E039 Hypothyroidism, unspecified: Secondary | ICD-10-CM | POA: Diagnosis not present

## 2020-02-12 DIAGNOSIS — R0602 Shortness of breath: Secondary | ICD-10-CM | POA: Diagnosis present

## 2020-02-12 DIAGNOSIS — I890 Lymphedema, not elsewhere classified: Secondary | ICD-10-CM

## 2020-02-12 DIAGNOSIS — I5021 Acute systolic (congestive) heart failure: Secondary | ICD-10-CM | POA: Diagnosis not present

## 2020-02-12 DIAGNOSIS — Z79890 Hormone replacement therapy (postmenopausal): Secondary | ICD-10-CM

## 2020-02-12 DIAGNOSIS — Z803 Family history of malignant neoplasm of breast: Secondary | ICD-10-CM

## 2020-02-12 DIAGNOSIS — I110 Hypertensive heart disease with heart failure: Secondary | ICD-10-CM | POA: Diagnosis not present

## 2020-02-12 DIAGNOSIS — Z8249 Family history of ischemic heart disease and other diseases of the circulatory system: Secondary | ICD-10-CM

## 2020-02-12 DIAGNOSIS — Z801 Family history of malignant neoplasm of trachea, bronchus and lung: Secondary | ICD-10-CM

## 2020-02-12 DIAGNOSIS — R531 Weakness: Secondary | ICD-10-CM | POA: Diagnosis not present

## 2020-02-12 DIAGNOSIS — Z800 Family history of malignant neoplasm of digestive organs: Secondary | ICD-10-CM

## 2020-02-12 DIAGNOSIS — I4819 Other persistent atrial fibrillation: Secondary | ICD-10-CM | POA: Diagnosis present

## 2020-02-12 DIAGNOSIS — Z79899 Other long term (current) drug therapy: Secondary | ICD-10-CM | POA: Diagnosis not present

## 2020-02-12 DIAGNOSIS — I5023 Acute on chronic systolic (congestive) heart failure: Secondary | ICD-10-CM | POA: Diagnosis not present

## 2020-02-12 DIAGNOSIS — R069 Unspecified abnormalities of breathing: Secondary | ICD-10-CM | POA: Diagnosis not present

## 2020-02-12 DIAGNOSIS — Z7901 Long term (current) use of anticoagulants: Secondary | ICD-10-CM | POA: Diagnosis not present

## 2020-02-12 DIAGNOSIS — I517 Cardiomegaly: Secondary | ICD-10-CM | POA: Diagnosis not present

## 2020-02-12 DIAGNOSIS — T68XXXA Hypothermia, initial encounter: Secondary | ICD-10-CM | POA: Diagnosis not present

## 2020-02-12 DIAGNOSIS — J80 Acute respiratory distress syndrome: Secondary | ICD-10-CM | POA: Diagnosis not present

## 2020-02-12 DIAGNOSIS — Z6841 Body Mass Index (BMI) 40.0 and over, adult: Secondary | ICD-10-CM | POA: Diagnosis not present

## 2020-02-12 DIAGNOSIS — M79605 Pain in left leg: Secondary | ICD-10-CM | POA: Diagnosis not present

## 2020-02-12 DIAGNOSIS — J811 Chronic pulmonary edema: Secondary | ICD-10-CM | POA: Diagnosis not present

## 2020-02-12 DIAGNOSIS — Z20822 Contact with and (suspected) exposure to covid-19: Secondary | ICD-10-CM | POA: Diagnosis present

## 2020-02-12 DIAGNOSIS — R0902 Hypoxemia: Secondary | ICD-10-CM

## 2020-02-12 DIAGNOSIS — Z23 Encounter for immunization: Secondary | ICD-10-CM | POA: Diagnosis not present

## 2020-02-12 DIAGNOSIS — T462X5A Adverse effect of other antidysrhythmic drugs, initial encounter: Secondary | ICD-10-CM | POA: Diagnosis not present

## 2020-02-12 DIAGNOSIS — J9690 Respiratory failure, unspecified, unspecified whether with hypoxia or hypercapnia: Secondary | ICD-10-CM | POA: Diagnosis not present

## 2020-02-12 DIAGNOSIS — I872 Venous insufficiency (chronic) (peripheral): Secondary | ICD-10-CM

## 2020-02-12 DIAGNOSIS — L97909 Non-pressure chronic ulcer of unspecified part of unspecified lower leg with unspecified severity: Secondary | ICD-10-CM

## 2020-02-12 DIAGNOSIS — R600 Localized edema: Secondary | ICD-10-CM | POA: Diagnosis not present

## 2020-02-12 DIAGNOSIS — Z888 Allergy status to other drugs, medicaments and biological substances status: Secondary | ICD-10-CM | POA: Diagnosis not present

## 2020-02-12 DIAGNOSIS — J189 Pneumonia, unspecified organism: Secondary | ICD-10-CM | POA: Diagnosis not present

## 2020-02-12 DIAGNOSIS — M79604 Pain in right leg: Secondary | ICD-10-CM | POA: Diagnosis not present

## 2020-02-12 LAB — COMPREHENSIVE METABOLIC PANEL
ALT: 17 U/L (ref 0–44)
AST: 15 U/L (ref 15–41)
Albumin: 3.5 g/dL (ref 3.5–5.0)
Alkaline Phosphatase: 63 U/L (ref 38–126)
Anion gap: 10 (ref 5–15)
BUN: 19 mg/dL (ref 6–20)
CO2: 32 mmol/L (ref 22–32)
Calcium: 8.8 mg/dL — ABNORMAL LOW (ref 8.9–10.3)
Chloride: 95 mmol/L — ABNORMAL LOW (ref 98–111)
Creatinine, Ser: 1.02 mg/dL — ABNORMAL HIGH (ref 0.44–1.00)
GFR, Estimated: >60 mL/min (ref >60)
Glucose, Bld: 130 mg/dL — ABNORMAL HIGH (ref 70–99)
Potassium: 4.7 mmol/L (ref 3.5–5.1)
Sodium: 137 mmol/L (ref 135–145)
Total Bilirubin: 1.5 mg/dL — ABNORMAL HIGH (ref 0.3–1.2)
Total Protein: 7.3 g/dL (ref 6.5–8.1)

## 2020-02-12 LAB — RESPIRATORY PANEL BY RT PCR (FLU A&B, COVID)
Influenza A by PCR: NEGATIVE
Influenza B by PCR: NEGATIVE
SARS Coronavirus 2 by RT PCR: NEGATIVE

## 2020-02-12 LAB — CBC WITH DIFFERENTIAL/PLATELET
Abs Immature Granulocytes: 0.04 10*3/uL (ref 0.00–0.07)
Basophils Absolute: 0 10*3/uL (ref 0.0–0.1)
Basophils Relative: 0 %
Eosinophils Absolute: 0 10*3/uL (ref 0.0–0.5)
Eosinophils Relative: 0 %
HCT: 39.7 % (ref 36.0–46.0)
Hemoglobin: 12.2 g/dL (ref 12.0–15.0)
Immature Granulocytes: 0 %
Lymphocytes Relative: 2 %
Lymphs Abs: 0.3 10*3/uL — ABNORMAL LOW (ref 0.7–4.0)
MCH: 29.1 pg (ref 26.0–34.0)
MCHC: 30.7 g/dL (ref 30.0–36.0)
MCV: 94.7 fL (ref 80.0–100.0)
Monocytes Absolute: 0.5 10*3/uL (ref 0.1–1.0)
Monocytes Relative: 4 %
Neutro Abs: 11.8 10*3/uL — ABNORMAL HIGH (ref 1.7–7.7)
Neutrophils Relative %: 94 %
Platelets: 204 10*3/uL (ref 150–400)
RBC: 4.19 MIL/uL (ref 3.87–5.11)
RDW: 16.5 % — ABNORMAL HIGH (ref 11.5–15.5)
WBC: 12.6 10*3/uL — ABNORMAL HIGH (ref 4.0–10.5)
nRBC: 0.3 % — ABNORMAL HIGH (ref 0.0–0.2)

## 2020-02-12 LAB — TROPONIN I (HIGH SENSITIVITY)
Troponin I (High Sensitivity): 4 ng/L (ref <18)
Troponin I (High Sensitivity): 4 ng/L (ref <18)

## 2020-02-12 LAB — PROCALCITONIN: Procalcitonin: 0.1 ng/mL

## 2020-02-12 LAB — BRAIN NATRIURETIC PEPTIDE: B Natriuretic Peptide: 140.9 pg/mL — ABNORMAL HIGH (ref 0.0–100.0)

## 2020-02-12 LAB — FIBRIN DERIVATIVES D-DIMER (ARMC ONLY): Fibrin derivatives D-dimer (ARMC): 793.98 ng/mL (FEU) — ABNORMAL HIGH (ref 0.00–499.00)

## 2020-02-12 LAB — HIV ANTIBODY (ROUTINE TESTING W REFLEX): HIV Screen 4th Generation wRfx: NONREACTIVE

## 2020-02-12 LAB — LACTIC ACID, PLASMA: Lactic Acid, Venous: 1.5 mmol/L (ref 0.5–1.9)

## 2020-02-12 MED ORDER — ONDANSETRON HCL 4 MG PO TABS: 4.0000 mg | ORAL_TABLET | Freq: Four times a day (QID) | ORAL | Status: DC | PRN

## 2020-02-12 MED ORDER — IPRATROPIUM-ALBUTEROL 0.5-2.5 (3) MG/3ML IN SOLN
3.0000 mL | Freq: Once | RESPIRATORY_TRACT | Status: AC
  Administered 2020-02-12: 3 mL via RESPIRATORY_TRACT
  Filled 2020-02-12: qty 3

## 2020-02-12 MED ORDER — SODIUM CHLORIDE 0.9 % IV SOLN: 2.0000 g | INTRAVENOUS | Status: DC

## 2020-02-12 MED ORDER — BISACODYL 5 MG PO TBEC: 5.0000 mg | DELAYED_RELEASE_TABLET | Freq: Every day | ORAL | Status: DC | PRN

## 2020-02-12 MED ORDER — ALBUTEROL SULFATE (2.5 MG/3ML) 0.083% IN NEBU: 2.5000 mg | INHALATION_SOLUTION | RESPIRATORY_TRACT | Status: DC | PRN

## 2020-02-12 MED ORDER — FUROSEMIDE 10 MG/ML IJ SOLN
40.0000 mg | Freq: Two times a day (BID) | INTRAMUSCULAR | Status: DC
  Administered 2020-02-13 – 2020-02-18 (×12): 40 mg via INTRAVENOUS
  Filled 2020-02-12 (×12): qty 4

## 2020-02-12 MED ORDER — DILTIAZEM HCL 60 MG PO TABS
60.0000 mg | ORAL_TABLET | Freq: Once | ORAL | Status: AC
  Administered 2020-02-12: 60 mg via ORAL
  Filled 2020-02-12: qty 1

## 2020-02-12 MED ORDER — APIXABAN 5 MG PO TABS
5.0000 mg | ORAL_TABLET | Freq: Two times a day (BID) | ORAL | Status: DC
  Administered 2020-02-13 – 2020-02-20 (×16): 5 mg via ORAL
  Filled 2020-02-12 (×16): qty 1

## 2020-02-12 MED ORDER — ACETAMINOPHEN 650 MG RE SUPP: 650.0000 mg | Freq: Four times a day (QID) | RECTAL | Status: DC | PRN

## 2020-02-12 MED ORDER — SODIUM CHLORIDE 0.9 % IV SOLN
500.0000 mg | INTRAVENOUS | Status: AC
  Administered 2020-02-12 – 2020-02-16 (×5): 500 mg via INTRAVENOUS
  Filled 2020-02-12 (×5): qty 500

## 2020-02-12 MED ORDER — METHYLPREDNISOLONE SODIUM SUCC 125 MG IJ SOLR
60.0000 mg | Freq: Two times a day (BID) | INTRAMUSCULAR | Status: DC
  Administered 2020-02-12 – 2020-02-13 (×3): 60 mg via INTRAVENOUS
  Filled 2020-02-12 (×3): qty 2

## 2020-02-12 MED ORDER — DILTIAZEM HCL ER COATED BEADS 240 MG PO CP24
240.0000 mg | ORAL_CAPSULE | Freq: Every day | ORAL | Status: DC
  Administered 2020-02-13 – 2020-02-14 (×2): 240 mg via ORAL
  Filled 2020-02-12: qty 1
  Filled 2020-02-12 (×2): qty 2
  Filled 2020-02-12: qty 1

## 2020-02-12 MED ORDER — FUROSEMIDE 10 MG/ML IJ SOLN
60.0000 mg | Freq: Once | INTRAMUSCULAR | Status: AC
  Administered 2020-02-12: 60 mg via INTRAVENOUS
  Filled 2020-02-12: qty 8

## 2020-02-12 MED ORDER — SODIUM CHLORIDE 0.9 % IV SOLN
2.0000 g | INTRAVENOUS | Status: DC
  Administered 2020-02-12: 2 g via INTRAVENOUS
  Filled 2020-02-12 (×2): qty 20

## 2020-02-12 MED ORDER — GUAIFENESIN ER 600 MG PO TB12
600.0000 mg | ORAL_TABLET | Freq: Two times a day (BID) | ORAL | Status: DC
  Administered 2020-02-13 – 2020-02-20 (×16): 600 mg via ORAL
  Filled 2020-02-12 (×16): qty 1

## 2020-02-12 MED ORDER — IPRATROPIUM-ALBUTEROL 0.5-2.5 (3) MG/3ML IN SOLN
3.0000 mL | Freq: Four times a day (QID) | RESPIRATORY_TRACT | Status: DC
  Administered 2020-02-12 – 2020-02-13 (×3): 3 mL via RESPIRATORY_TRACT
  Filled 2020-02-12 (×3): qty 3

## 2020-02-12 MED ORDER — POLYETHYLENE GLYCOL 3350 17 G PO PACK: 17.0000 g | PACK | Freq: Every day | ORAL | Status: DC | PRN

## 2020-02-12 MED ORDER — AZITHROMYCIN 500 MG PO TABS: 500.0000 mg | ORAL_TABLET | Freq: Every day | ORAL | Status: DC

## 2020-02-12 MED ORDER — LEVOTHYROXINE SODIUM 50 MCG PO TABS
75.0000 ug | ORAL_TABLET | Freq: Every day | ORAL | Status: DC
  Administered 2020-02-13 – 2020-02-20 (×8): 75 ug via ORAL
  Filled 2020-02-12 (×8): qty 1

## 2020-02-12 MED ORDER — ACETAMINOPHEN 325 MG PO TABS: 650.0000 mg | ORAL_TABLET | Freq: Four times a day (QID) | ORAL | Status: DC | PRN

## 2020-02-12 MED ORDER — ONDANSETRON HCL 4 MG/2ML IJ SOLN: 4.0000 mg | Freq: Four times a day (QID) | INTRAMUSCULAR | Status: DC | PRN

## 2020-02-12 NOTE — ED Triage Notes (Signed)
Pt arrived via EMS for report of SHOB x2-3 days that got increasingly worse today - EMS arrived to home and O2 sat 75% on 5L of O2 (pt norm at home) - Pt was placed on cpap and given 125mg  of solumedrol and 1 duoneb - O2 sat improved to 93%

## 2020-02-12 NOTE — H&P (Signed)
History and Physical    Katherine Scott MCN:[id number] DOB: 04-Jun-1960 DOA: 02/12/2020  PCP: Smitty Cords, DO   Patient coming from: home, lives alone  I have personally briefly reviewed patient's old medical records in [facility]  Chief Complaint: Shortness of breath  HPI: Katherine Scott is a 58 y.o. female with medical history significant of persistent atrial fibrillation on Eliquis, COPD, morbid obesity, lymphedema, OSA on CPAP at night, chronic hypoxemic respiratory failure on 2 L of oxygen who presented to the ED from home via EMS due to worsening shortness of breath and increased oxygen requirements.  She reports 2 to 3 days of worsening shortness of breath that got much worse today.  She typically uses 1 L/min of supplemental oxygen, but has been requiring 4 L/min continuously for the past couple of days.  She reports fever chills at home and T-max 100 point 57F.  She denies abdominal pain, nausea vomiting or diarrhea, dysuria or urinary frequency.  She does have frequent bouts of lower extremity cellulitis due to lymphedema but are currently doing well and at baseline.  Recently did complete a course of Bactrim for cellulitis.  She has history of amiodarone toxicity and admissions last year for presumed pneumonia, but later attributed to amiodarone.  This was stopped last year in October.  She does follow with pulmonary as an outpatient.  She does report some some cough with sputum production.  States that her A. fib has been intermittently fast at home as well.  She denies history of pulmonary edema or fluid in her lungs when her A. fib gets uncontrolled.  She does report wheezing and feeling tight in her chest as well.  Of note, patient reports being on chronic prednisone which pulmonology told her should be weaned off.  Patient has been weaning down prednisone and notes worsening respiratory status with this.   ED Course: Initial vitals afebrile, heart rate 131, respirations 24.   Initially placed on BiPAP due to severe respiratory distress.  CBC showed leukocytosis 12.6k.  BMP essentially unremarkable.  BNP 140.9, troponin IV, lactic acid 1.5.  Negative for influenza and COVID-19.  Chest x-ray showed bilateral opacification and cardiomegaly with questionable pleural effusions, thought most likely be pulmonary edema but pneumonia not excluded.  She was treated with Rocephin and Zithromax, IV Lasix and DuoNeb in the ED and reported feeling better when seen on admission.  Review of Systems: As per HPI otherwise 10 point review of systems negative.    Past Medical History:  Diagnosis Date  . Abnormal mammogram, unspecified 2013  . Atrial fibrillation (HCC)    2013  . Breast screening, unspecified   . COPD (chronic obstructive pulmonary disease) (HCC)   . History of continuous positive airway pressure (CPAP) therapy at home   . Hypothyroidism   . Obesity, unspecified   . Pneumonia   . Screening for obesity   . Sleep apnea   . Special screening for malignant neoplasms, colon   . Unspecified essential hypertension     Past Surgical History:  Procedure Laterality Date  . ABDOMINAL HYSTERECTOMY  2011  . ANTERIOR CRUCIATE LIGAMENT REPAIR  2003  . BREAST BIOPSY Left January 22, 2012   left breast stereotactic biopsy showed fibroadenomatous changes with microcalcifications, fat necrosis, and sclerosing adenosis and columnar cell changes. No evidence of atypia or malignancy.  Marland Kitchen CARDIOVERSION N/A 09/22/2016   Procedure: CARDIOVERSION;  Surgeon: Dalia Heading, MD;  Location: [facility] ORS;  Service: Cardiovascular;  Laterality:  N/A;  . CARDIOVERSION N/A 01/17/2018   Procedure: CARDIOVERSION;  Surgeon: Dalia Heading, MD;  Location: [facility];  Service: Cardiovascular;  Laterality: N/A;  . CARDIOVERSION N/A 01/09/2019   Procedure: CARDIOVERSION;  Surgeon: Dalia Heading, MD;  Location: [facility] OR[facility];  Service: Cardiovascular;  Laterality: N/A;  . CESAREAN SECTION  1985, 1987, and  1998  . COLONOSCOPY  2011   Dr. Mechele Collin  . ELECTROPHYSIOLOGIC STUDY N/A 12/03/2014   Procedure: Cardioversion;  Surgeon: Dalia Heading, MD;  Location: [facility];  Service: Cardiovascular;  Laterality: N/A;  . ELECTROPHYSIOLOGIC STUDY N/A 04/01/2015   Procedure: Cardioversion;  Surgeon: Dalia Heading, MD;  Location: [facility];  Service: Cardiovascular;  Laterality: N/A;  . KNEE SURGERY  2013     reports that she is a non-smoker but has been exposed to tobacco smoke. She has never used smokeless tobacco. She reports that she does not drink alcohol and does not use drugs.  Allergies  Allergen Reactions  . Amiodarone     Caused toxicity     Family History  Problem Relation Age of Onset  . Colon cancer Mother        diagnosis age 44's  . Colon cancer Sister        diagnosis age 67  . Atrial fibrillation Sister   . Breast cancer Other        Maternal Grandmother; diagnosis age 68's  . Lung cancer Other        Paternal Grandmother; diagnosis age 28's  . Colon cancer Other        Paternal Grandfather; diagnosis age 75's  . Heart disease Father      Prior to Admission medications   Medication Sig Start Date End Date Taking? Authorizing Provider  albuterol (VENTOLIN HFA) 108 (90 Base) MCG/ACT inhaler Inhale 2 puffs into the lungs every 6 (six) hours as needed for wheezing. 06/17/19   Lorin Glass, MD  amLODipine (NORVASC) 5 MG tablet Take 1 tablet (5 mg total) by mouth daily. 07/09/19 07/08/20  Willy Eddy, MD  apixaban (ELIQUIS) 5 MG TABS tablet Take 5 mg by mouth 2 (two) times daily.  10/31/17   [provider]  cyanocobalamin 2000 MCG tablet Take 2,000 mcg by mouth daily.     [provider]  diltiazem (CARDIZEM CD) 240 MG 24 hr capsule Take 240 mg by mouth daily after breakfast.    [provider]  diltiazem (TIAZAC) 240 MG 24 hr capsule TAKE 1 CAPSULE (240 MG TOTAL) BY MOUTH ONCE DAILY. 02/18/19   [provider]   Fluticasone-Umeclidin-Vilant (TRELEGY ELLIPTA) 100-62.5-25 MCG/INH AEPB Inhale 1 Inhaler into the lungs daily. 01/22/20   Hunsucker, Lesia Sago, MD  furosemide (LASIX) 40 MG tablet Take 40 mg by mouth daily. 11/01/17   [provider]  levothyroxine (SYNTHROID) 75 MCG tablet TAKE 1 TABLET BY MOUTH DAILY BEFORE BREAKFAST. WILL NEED THYROID LAB & FOLLOW-UP FOR FURTHER REFILLS 02/03/20   Smitty Cords, DO  OXYGEN Inhale 2 L/L into the lungs as needed.     [provider]  potassium chloride SA (K-DUR,KLOR-CON) 20 MEQ tablet Take 1 tablet (20 mEq total) by mouth daily. Patient taking differently: Take 20 mEq by mouth at bedtime.  02/19/15   Dalia Heading, MD  predniSONE (DELTASONE) 10 MG tablet TAKE 1 TABLET (10 MG TOTAL) BY MOUTH DAILY WITH BREAKFAST. Patient not taking: Reported on 01/28/2020 07/21/19   Lorin Glass, MD  Spacer/Aero-Holding Rudean Curt 1 Device by  Does not apply route as needed. 05/24/18   Galen ManilaKennedy, Lauren Renee, NP  sulfamethoxazole-trimethoprim (BACTRIM DS) 800-160 MG tablet Take 1 tablet by mouth 2 (two) times daily. 02/04/20   Georgiana SpinnerBrown, Fallon E, NP  traMADol (ULTRAM) 50 MG tablet Take 50 mg by mouth 3 (three) times daily as needed. 12/24/19   [provider]    Physical Exam: Vitals:   02/12/20 1400 02/12/20 1415 02/12/20 1430 02/12/20 1445  BP: (!) 162/102  (!) 157/85   Pulse: (!) 126 95 (!) 105 (!) 109  Resp: 20 19 (!) 24 19  Temp:      SpO2: 96% 96% 95% 95%  Weight:      Height:         Constitutional: NAD, calm, comfortable, obese Eyes: EOMI, lids and conjunctivae normal ENMT: On BiPAP.  Hearing grossly normal.  Respiratory: Decreased breath sounds with high-pitched expiratory wheezing, mildly increased respiratory effort. No accessory muscle use.  On BiPAP. Cardiovascular: Irregularly irregular, no murmurs / rubs / gallops.  Abdomen: soft, NT, ND  Musculoskeletal: Distal lower extremities are erythematous but without  differential warmth on palpation, nontender, bilateral lower extremity edema. Normal muscle tone.  Skin: dry, intact, warm to touch Neurologic: CN 2-12 grossly intact. Normal speech.  Grossly non-focal exam. Psychiatric: Alert and oriented x 3. Normal mood. Congruent affect.  Normal judgement and insight.    Labs on Admission: I have personally reviewed following labs and imaging studies  CBC: Recent Labs  Lab 02/12/20 1440  WBC 12.6*  NEUTROABS 11.8*  HGB 12.2  HCT 39.7  MCV 94.7  PLT 204   Basic Metabolic Panel: Recent Labs  Lab 02/12/20 1258  NA 137  K 4.7  CL 95*  CO2 32  GLUCOSE 130*  BUN 19  CREATININE 1.02*  CALCIUM 8.8*   GFR: Estimated Creatinine Clearance: 91.8 mL/min (A) (by C-G formula based on SCr of 1.02 mg/dL (H)). Liver Function Tests: Recent Labs  Lab 02/12/20 1258  AST 15  ALT 17  ALKPHOS 63  BILITOT 1.5*  PROT 7.3  ALBUMIN 3.5   No results for input(s): LIPASE, AMYLASE in the last 168 hours. No results for input(s): AMMONIA in the last 168 hours. Coagulation Profile: No results for input(s): INR, PROTIME in the last 168 hours. Cardiac Enzymes: No results for input(s): CKTOTAL, CKMB, CKMBINDEX, TROPONINI in the last 168 hours. BNP (last 3 results) No results for input(s): PROBNP in the last 8760 hours. HbA1C: No results for input(s): HGBA1C in the last 72 hours. CBG: No results for input(s): GLUCAP in the last 168 hours. Lipid Profile: No results for input(s): CHOL, HDL, LDLCALC, TRIG, CHOLHDL, LDLDIRECT in the last 72 hours. Thyroid Function Tests: No results for input(s): TSH, T4TOTAL, FREET4, T3FREE, THYROIDAB in the last 72 hours. Anemia Panel: No results for input(s): VITAMINB12, FOLATE, FERRITIN, TIBC, IRON, RETICCTPCT in the last 72 hours. Urine analysis:    Component Value Date/Time   COLORURINE Yellow 11/13/2011 1415   APPEARANCEUR Cloudy 11/13/2011 1415   LABSPEC 1.023 11/13/2011 1415   PHURINE 5.0 11/13/2011 1415    GLUCOSEU Negative 11/13/2011 1415   HGBUR Negative 11/13/2011 1415   BILIRUBINUR Negative 11/13/2011 1415   KETONESUR Negative 11/13/2011 1415   PROTEINUR Negative 11/13/2011 1415   NITRITE Negative 11/13/2011 1415   LEUKOCYTESUR Negative 11/13/2011 1415    Radiological Exams on Admission: DG Chest Portable 1 View  Result Date: 02/12/2020 CLINICAL DATA:  Shortness of breath EXAM: PORTABLE CHEST 1 VIEW COMPARISON:  06/17/2019 FINDINGS:  Bilateral pulmonary opacities. Possible pleural effusions. Cardiomediastinal contours are obscured. There is cardiomegaly. No pneumothorax. IMPRESSION: Bilateral pulmonary opacities with cardiomegaly and possible pleural effusions. Favored to reflect pulmonary edema although pneumonia is not excluded. Electronically Signed   By: Guadlupe Spanish M.D.   On: 02/12/2020 13:41    EKG: Independently reviewed.  A. fib at 114 bpm, poor quality with wandering baseline but no obvious ST segment changes  Assessment/Plan Principal Problem:   Acute on chronic respiratory failure with hypoxia (HCC) Active Problems:   COPD with acute exacerbation (HCC)   Pulmonary edema   Persistent atrial fibrillation (HCC)   Acquired hypothyroidism   BMI 60.0-69.9, adult (HCC)   Lymphedema   Shortness of breath    Acute on chronic respiratory failure with hypoxia -at baseline uses 1 L/min, presented with dyspnea and requiring BiPAP, had been requiring 4 L/min at home continuously for the past couple days. COPD with acute exacerbation -presented with diffuse expiratory wheezing and diminished aeration Pulmonary edema - pt denies prior hx of CHF, no prior echo in [facility] Shortness of breath - due to above.  Patient's acute respiratory illness appears multifactorial with COPD exacerbation and pulmonary edema versus potentially pneumonia contributing.  She was having fevers at home consistent with infection.  On exam has diminished aeration and expiratory wheezing consistent with COPD  exacerbation.  Chest x-ray with diffuse bilateral opacities which could reflect pulmonary edema or pneumonia.  BNP was mildly elevated. --Check procalcitonin, D-dimer --Consider CTA chest to rule out PE, but her chest x-ray findings certainly explain the degree of her hypoxia --Repeat chest x-ray in a.m. to see if improvement from diuresis given in the ED --Echo is pending --Empiric antibiotics: Rocephin and Zithromax  --IV steroids: Solu-Medrol 60 mg IV BID --Scheduled DuoNebs every 6 hours while awake --As needed albuterol nebs --Continue formulary equivalents for Trelegy --Schedule Mucinex BID --Tylenol as needed fevers   Persistent atrial fibrillation with RVR -presented with rapid A. fib initial heart rate 131, EKG confirming A. fib with rate of 114 in the ED.  Potentially contributing to pulmonary edema. --Continue Eliquis, diltiazem pending med history  Acquired hypothyroidism -continue levothyroxine.  Check TSH with a.m. labs.  Morbid obesity - Body mass index is 68.41 kg/m.  Complicates overall care and prognosis.  Lymphedema -appears stable.  Monitor.  Recent lower extremity cellulitis -completed course of Bactrim.      DVT prophylaxis: On Eliquis   Code Status: Full code Family Communication: None at bedside during encounter Disposition Plan: Expect DC home but will have PT OT evaluate.  Lives alone. Consults called: None  Status is: Inpatient  Remains inpatient appropriate because:IV treatments appropriate due to intensity of illness or inability to take PO.  Patient presented in severe respiratory distress requiring BiPAP.     Dispo: The patient is from: Home              Anticipated d/c is to: Home              Anticipated d/c date is: 3 days              Patient currently is not medically stable to d/c.           Katherine Banter, DO [facility]  02/12/2020, 4:19 PM    If 7PM-7AM, please contact night-coverage. How to contact the [facility]  Attending or Consulting provider 7A - 7P or covering provider during after hours 7P -7A, for this patient?    1.  Check the care team in [facility] and look for a) attending/consulting [facility] provider listed and b) the [facility] team listed 2. Log into [email or web address] and use [place]'s universal password to access. If you do not have the password, please contact the hospital operator. 3. Locate the [facility] provider you are looking for under [facility] and page to a number that you can be directly reached. 4. If you still have difficulty reaching the provider, please page the [facility] (Director on Call) for the Hospitalists listed on amion for assistance.

## 2020-02-12 NOTE — ED Notes (Signed)
Admitting MD at bedside.

## 2020-02-12 NOTE — ED Notes (Signed)
This RN and Sherrill Raring, Charity fundraiser at bedside. Pt with notable small bowel movement. Pt cleaned and changed at this time. Purewick placed on pt. IV anx started.

## 2020-02-12 NOTE — Telephone Encounter (Signed)
Called and spoke with pt who stated she has had symptoms of low grade fever, and chills when she has the fever. Pt also has been coughing more and also is more SOB than usual.  Pt said she has been wearing her O2 and currently had it on 4L continuous.  Symptoms began 2 days ago.  Pt said she called PCP yesterday 11/3 and had a virtual visit with PCP today 11/4. Pt was told by PCP since she was on O2 and experiencing the above symptoms that she either needed to call pulmonary for advice or go to the ED to be evaluated.  Asked pt what her temp was last and she stated that it was 100.5 which is the highest it has been.  Pt denied any complaints of body aches.  Due to pt being more SOB even with O2 on, had pt check O2 sats on the phone with me and pt said that her pulse ox was saying 75%.  I stated to pt that she needed to go to the ED to be evaluated due to her sats being at 75% on her 4L O2 and she verbalized understanding.  Routing to Dr. Judeth Horn as an Lorain Childes. Nothing further needed.

## 2020-02-12 NOTE — ED Notes (Signed)
Hospitalist at bedside 

## 2020-02-12 NOTE — ED Provider Notes (Signed)
[facility] Emergency Department Provider Note    First MD Initiated Contact with Patient 02/12/20 1253     (approximate)  I have reviewed the triage vital signs and the nursing notes.   HISTORY  Chief Complaint Respiratory Distress  History limited 2/2 respiratory distress  HPI Katherine Scott is a 59 y.o. female below with below listed past medical history presents to the ER in acute respiratory distress.  Patient arrives on CPAP.  Does have some wheezing but appears volume overloaded clinically.  Has also had some productive cough.  She denies any chest pain.    Past Medical History:  Diagnosis Date  . Abnormal mammogram, unspecified 2013  . Atrial fibrillation (HCC)    2013  . Breast screening, unspecified   . COPD (chronic obstructive pulmonary disease) (HCC)   . History of continuous positive airway pressure (CPAP) therapy at home   . Hypothyroidism   . Obesity, unspecified   . Pneumonia   . Screening for obesity   . Sleep apnea   . Special screening for malignant neoplasms, colon   . Unspecified essential hypertension    Family History  Problem Relation Age of Onset  . Colon cancer Mother        diagnosis age 33's  . Colon cancer Sister        diagnosis age 16  . Atrial fibrillation Sister   . Breast cancer Other        Maternal Grandmother; diagnosis age 38's  . Lung cancer Other        Paternal Grandmother; diagnosis age 67's  . Colon cancer Other        Paternal Grandfather; diagnosis age 23's  . Heart disease Father    Past Surgical History:  Procedure Laterality Date  . ABDOMINAL HYSTERECTOMY  2011  . ANTERIOR CRUCIATE LIGAMENT REPAIR  2003  . BREAST BIOPSY Left January 22, 2012   left breast stereotactic biopsy showed fibroadenomatous changes with microcalcifications, fat necrosis, and sclerosing adenosis and columnar cell changes. No evidence of atypia or malignancy.  Marland Kitchen CARDIOVERSION N/A 09/22/2016   Procedure:  CARDIOVERSION;  Surgeon: Dalia Heading, MD;  Location: [facility] ORS;  Service: Cardiovascular;  Laterality: N/A;  . CARDIOVERSION N/A 01/17/2018   Procedure: CARDIOVERSION;  Surgeon: Dalia Heading, MD;  Location: [facility] ORS;  Service: Cardiovascular;  Laterality: N/A;  . CARDIOVERSION N/A 01/09/2019   Procedure: CARDIOVERSION;  Surgeon: Dalia Heading, MD;  Location: [facility] ORS;  Service: Cardiovascular;  Laterality: N/A;  . CESAREAN SECTION  1985, 1987, and 1998  . COLONOSCOPY  2011   Dr. Mechele Collin  . ELECTROPHYSIOLOGIC STUDY N/A 12/03/2014   Procedure: Cardioversion;  Surgeon: Dalia Heading, MD;  Location: [facility] ORS;  Service: Cardiovascular;  Laterality: N/A;  . ELECTROPHYSIOLOGIC STUDY N/A 04/01/2015   Procedure: Cardioversion;  Surgeon: Dalia Heading, MD;  Location: [facility] ORS;  Service: Cardiovascular;  Laterality: N/A;  . KNEE SURGERY  2013   Patient Active Problem List   Diagnosis Date Noted  . Shortness of breath 02/12/2020  . Venous ulcer (HCC) 10/09/2019  . Acute on chronic respiratory failure with hypoxia (HCC) 03/20/2019  . Lymphedema 02/26/2017  . 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 Acquired hypothyroidism 08/11/2016  . BMI 60.0-69.9, adult (HCC) 08/11/2016  . Persistent atrial fibrillation (HCC) 06/17/2014      Prior to Admission medications   Medication Sig Start Date End Date Taking? Authorizing Provider  albuterol (VENTOLIN HFA) 108 (90 Base) MCG/ACT  inhaler Inhale 2 puffs into the lungs every 6 (six) hours as needed for wheezing. 06/17/19   Lorin Glass, MD  amLODipine (NORVASC) 5 MG tablet Take 1 tablet (5 mg total) by mouth daily. 07/09/19 07/08/20  Willy Eddy, MD  apixaban (ELIQUIS) 5 MG TABS tablet Take 5 mg by mouth 2 (two) times daily.  10/31/17   [provider]  cyanocobalamin 2000 MCG tablet Take 2,000 mcg by mouth daily.     [provider]  diltiazem (CARDIZEM CD) 240 MG 24 hr capsule Take 240 mg by mouth daily after  breakfast.    [provider]  diltiazem (TIAZAC) 240 MG 24 hr capsule TAKE 1 CAPSULE (240 MG TOTAL) BY MOUTH ONCE DAILY. 02/18/19   [provider]  Fluticasone-Umeclidin-Vilant (TRELEGY ELLIPTA) 100-62.5-25 MCG/INH AEPB Inhale 1 Inhaler into the lungs daily. 01/22/20   Hunsucker, Lesia Sago, MD  furosemide (LASIX) 40 MG tablet Take 40 mg by mouth daily. 11/01/17   [provider]  levothyroxine (SYNTHROID) 75 MCG tablet TAKE 1 TABLET BY MOUTH DAILY BEFORE BREAKFAST. WILL NEED THYROID LAB & FOLLOW-UP FOR FURTHER REFILLS 02/03/20   Smitty Cords, DO  OXYGEN Inhale 2 L/L into the lungs as needed.     [provider]  potassium chloride SA (K-DUR,KLOR-CON) 20 MEQ tablet Take 1 tablet (20 mEq total) by mouth daily. Patient taking differently: Take 20 mEq by mouth at bedtime.  02/19/15   Dalia Heading, MD  predniSONE (DELTASONE) 10 MG tablet TAKE 1 TABLET (10 MG TOTAL) BY MOUTH DAILY WITH BREAKFAST. Patient not taking: Reported on 01/28/2020 07/21/19   Lorin Glass, MD  Spacer/Aero-Holding Deretha Emory DEVI 1 Device by Does not apply route as needed. 05/24/18   Galen Manila, NP  sulfamethoxazole-trimethoprim (BACTRIM DS) 800-160 MG tablet Take 1 tablet by mouth 2 (two) times daily. 02/04/20   Georgiana Spinner, NP  traMADol (ULTRAM) 50 MG tablet Take 50 mg by mouth 3 (three) times daily as needed. 12/24/19   [provider]    Allergies Amiodarone    Social History Social History   Tobacco Use  . Smoking status: Passive Smoke Exposure - Never Smoker  . Smokeless tobacco: Never Used  Vaping Use  . Vaping Use: Never used  Substance Use Topics  . Alcohol use: No  . Drug use: No    Review of Systems Patient denies headaches, rhinorrhea, blurry vision, numbness, shortness of breath, chest pain, edema, cough, abdominal pain, nausea, vomiting, diarrhea, dysuria, fevers, rashes or hallucinations unless otherwise stated above in  HPI. ____________________________________________   PHYSICAL EXAM:  VITAL SIGNS: Vitals:   02/12/20 1430 02/12/20 1445  BP: (!) 157/85   Pulse: (!) 105 (!) 109  Resp: (!) 24 19  Temp:    SpO2: 95% 95%    Constitutional: Alert and oriented.  Eyes: Conjunctivae are normal.  Head: Atraumatic. Nose: No congestion/rhinnorhea. Mouth/Throat: Mucous membranes are moist.   Neck: No stridor. Painless ROM.  Cardiovascular: tachycardic rate, regular rhythm. Grossly normal heart sounds.  Good peripheral circulation. Respiratory: respiratory distress, scattered wheeze and diminished BS.   Gastrointestinal: Soft and nontender. No distention. No abdominal bruits. No CVA tenderness. Genitourinary: deferred Musculoskeletal: No lower extremity tenderness 2+ BLE edema. BLE   No joint effusions. Neurologic:  Normal speech and language. No gross focal neurologic deficits are appreciated. No facial droop Skin:  Skin is warm, dry and intact. No rash noted. Psychiatric: Mood and affect are normal. Speech and behavior are normal.  ____________________________________________   LABS (all labs ordered are listed, but only abnormal results are displayed)  Results for orders placed or performed during the hospital encounter of 02/12/20 (from the past 24 hour(s))  Comprehensive metabolic panel     Status: Abnormal   Collection Time: 02/12/20 12:58 PM  Result Value Ref Range   Sodium 137 135 - 145 mmol/L   Potassium 4.7 3.5 - 5.1 mmol/L   Chloride 95 (L) 98 - 111 mmol/L   CO2 32 22 - 32 mmol/L   Glucose, Bld 130 (H) 70 - 99 mg/dL   BUN 19 6 - 20 mg/dL   Creatinine, Ser 2.35 (H) 0.44 - 1.00 mg/dL   Calcium 8.8 (L) 8.9 - 10.3 mg/dL   Total Protein 7.3 6.5 - 8.1 g/dL   Albumin 3.5 3.5 - 5.0 g/dL   AST 15 15 - 41 U/L   ALT 17 0 - 44 U/L   Alkaline Phosphatase 63 38 - 126 U/L   Total Bilirubin 1.5 (H) 0.3 - 1.2 mg/dL   GFR, Estimated >57 >32 mL/min   Anion gap 10 5 - 15  Lactic acid, plasma      Status: None   Collection Time: 02/12/20 12:58 PM  Result Value Ref Range   Lactic Acid, Venous 1.5 0.5 - 1.9 mmol/L  Brain natriuretic peptide     Status: Abnormal   Collection Time: 02/12/20 12:58 PM  Result Value Ref Range   B Natriuretic Peptide 140.9 (H) 0.0 - 100.0 pg/mL  Troponin I (High Sensitivity)     Status: None   Collection Time: 02/12/20 12:58 PM  Result Value Ref Range   Troponin I (High Sensitivity) 4 <18 ng/L  Respiratory Panel by RT PCR (Flu A&B, Covid) - Nasopharyngeal Swab     Status: None   Collection Time: 02/12/20 12:58 PM   Specimen: Nasopharyngeal Swab  Result Value Ref Range   SARS Coronavirus 2 by RT PCR NEGATIVE NEGATIVE   Influenza A by PCR NEGATIVE NEGATIVE   Influenza B by PCR NEGATIVE NEGATIVE  CBC with Differential/Platelet     Status: Abnormal   Collection Time: 02/12/20  2:40 PM  Result Value Ref Range   WBC 12.6 (H) 4.0 - 10.5 K/uL   RBC 4.19 3.87 - 5.11 MIL/uL   Hemoglobin 12.2 12.0 - 15.0 g/dL   HCT 20.2 36 - 46 %   MCV 94.7 80.0 - 100.0 fL   MCH 29.1 26.0 - 34.0 pg   MCHC 30.7 30.0 - 36.0 g/dL   RDW 54.2 (H) 70.6 - 23.7 %   Platelets 204 150 - 400 K/uL   nRBC 0.3 (H) 0.0 - 0.2 %   Neutrophils Relative % 94 %   Neutro Abs 11.8 (H) 1.7 - 7.7 K/uL   Lymphocytes Relative 2 %   Lymphs Abs 0.3 (L) 0.7 - 4.0 K/uL   Monocytes Relative 4 %   Monocytes Absolute 0.5 0.1 - 1.0 K/uL   Eosinophils Relative 0 %   Eosinophils Absolute 0.0 0.0 - 0.5 K/uL   Basophils Relative 0 %   Basophils Absolute 0.0 0.0 - 0.1 K/uL   Immature Granulocytes 0 %   Abs Immature Granulocytes 0.04 0.00 - 0.07 K/uL   ____________________________________________  EKG My review and personal interpretation at Time: 13:00   Indication: sob  Rate: 115  Rhythm: sinus Axis: normal Other: nonspecific st abn, limited 2/2 motion artifact,  No stemi, ____________________________________________  RADIOLOGY  I personally reviewed all radiographic images ordered to evaluate  for the above acute complaints and reviewed radiology reports and findings.  These findings were personally discussed with the patient.  Please see medical record for radiology report.  ____________________________________________   PROCEDURES  Procedure(s) performed:  .Critical Care Performed by: Willy Eddyobinson, Zayne Draheim, MD Authorized by: Willy Eddyobinson, Jesusmanuel Erbes, MD   Critical care provider statement:    Critical care time (minutes):  35   Critical care time was exclusive of:  Separately billable procedures and treating other patients   Critical care was necessary to treat or prevent imminent or life-threatening deterioration of the following conditions:  Respiratory failure   Critical care was time spent personally by me on the following activities:  Development of treatment plan with patient or surrogate, discussions with consultants, evaluation of patient's response to treatment, examination of patient, obtaining history from patient or surrogate, ordering and performing treatments and interventions, ordering and review of laboratory studies, ordering and review of radiographic studies, pulse oximetry, re-evaluation of patient's condition and review of old charts      Critical Care performed: yes ____________________________________________   INITIAL IMPRESSION / ASSESSMENT AND PLAN / ED COURSE  Pertinent labs & imaging results that were available during my care of the patient were reviewed by me and considered in my medical decision making (see chart for details).   DDX: Asthma, copd, CHF, pna, ptx, malignancy, Pe, anemia   Katherine Scott is a 59 y.o. who presents to the ED with respiratory distress as described above.  Patient transitioned from CPAP over to BiPAP with improvement in symptoms.  Blood work we sent for the by differential.  She denying any chest pain.  Exam is concerning for acute CHF from flash pulmonary edema.  Reportedly had significant hypertension when EMS found her but  her blood pressures are now improving with systolics less than 90.  Have discussed with the patient and available family all diagnostics and treatments performed thus far and all questions were answered to the best of my ability. The patient demonstrates understanding and agreement with plan.   Clinical Course as of Feb 11 1530  Thu Feb 12, 2020  1317 My review of chest x-ray has diffuse edema concerning for flash pulmonary edema and CHF exacerbation.  She appears more comfortable on BiPAP.   [PR]  1408 Patient does appear significantly more comfortable.  Blood pressures normalizing.  I am going to order some IV Lasix.   [PR]    Clinical Course User Index [PR] Willy Eddyobinson, Toron Bowring, MD    The patient was evaluated in Emergency Department today for the symptoms described in the history of present illness. He/she was evaluated in the context of the global COVID-19 pandemic, which necessitated consideration that the patient might be at risk for infection with the SARS-CoV-2 virus that causes COVID-19. Institutional protocols and algorithms that pertain to the evaluation of patients at risk for COVID-19 are in a state of rapid change based on information released by regulatory bodies including the CDC and federal and state organizations. These policies and algorithms were followed during the patient's care in the ED.  As part of my medical decision making, I reviewed the following data within the electronic MEDICAL RECORD NUMBER Nursing notes reviewed and incorporated, Labs reviewed, notes from prior ED visits and [place] Controlled Substance Database   ____________________________________________   FINAL CLINICAL IMPRESSION(S) / ED DIAGNOSES  Final diagnoses:  Acute respiratory failure with hypoxia (HCC)      NEW MEDICATIONS STARTED DURING THIS VISIT:  New Prescriptions   No  medications on file     Note:  This document was prepared using Dragon voice recognition software and may include  unintentional dictation errors.    Willy Eddy, MD 02/12/20 1531

## 2020-02-12 NOTE — Progress Notes (Signed)
Pt on the scheduled for a virtual visit today for dyspnea x 4 days. She stated that she started off been SOB on Monday, but symptoms worsen as time progress. She became unable to breath at all without using her oxygen continuous. She is currently using 5L oxygen and still having difficulty breathing. She reports that her current SPO2  79%. She has an history of CHF, lung disease and was previously only uses her oxygen PRN mostly at bedtime at 1.5L. She also complains of low grade fever, shortness of breath, and chest congestion.  CMA met Dr. Parks Ranger for triage consultation, was encouraged to contact EMS for evaluation at hospital.  Patient reporting has pulmonologist provider.  Patient was made aware that can call pulmonologist office but likely they would recommend EMS for ED evaluation.  No provider met with patient virtually, in person or over the phone

## 2020-02-12 NOTE — ED Notes (Signed)
Katherine Scott [phone]   Gave sister update

## 2020-02-13 ENCOUNTER — Inpatient Hospital Stay
Admit: 2020-02-13 | Discharge: 2020-02-13 | Disposition: A | Discharge status: Home / Self Care | Payer: BC Managed Care – PPO | Attending: Internal Medicine | Admitting: Internal Medicine

## 2020-02-13 ENCOUNTER — Inpatient Hospital Stay: Payer: BC Managed Care – PPO

## 2020-02-13 DIAGNOSIS — R0602 Shortness of breath: Secondary | ICD-10-CM

## 2020-02-13 DIAGNOSIS — I4819 Other persistent atrial fibrillation: Secondary | ICD-10-CM

## 2020-02-13 LAB — CBC
HCT: 37 % (ref 36.0–46.0)
Hemoglobin: 11.3 g/dL — ABNORMAL LOW (ref 12.0–15.0)
MCH: 28.7 pg (ref 26.0–34.0)
MCHC: 30.5 g/dL (ref 30.0–36.0)
MCV: 93.9 fL (ref 80.0–100.0)
Platelets: 210 10*3/uL (ref 150–400)
RBC: 3.94 MIL/uL (ref 3.87–5.11)
RDW: 16.1 % — ABNORMAL HIGH (ref 11.5–15.5)
WBC: 9 10*3/uL (ref 4.0–10.5)
nRBC: 0.6 % — ABNORMAL HIGH (ref 0.0–0.2)

## 2020-02-13 LAB — BASIC METABOLIC PANEL
Anion gap: 10 (ref 5–15)
BUN: 26 mg/dL — ABNORMAL HIGH (ref 6–20)
CO2: 29 mmol/L (ref 22–32)
Calcium: 8.8 mg/dL — ABNORMAL LOW (ref 8.9–10.3)
Chloride: 97 mmol/L — ABNORMAL LOW (ref 98–111)
Creatinine, Ser: 1.1 mg/dL — ABNORMAL HIGH (ref 0.44–1.00)
GFR, Estimated: 58 mL/min — ABNORMAL LOW (ref >60)
Glucose, Bld: 171 mg/dL — ABNORMAL HIGH (ref 70–99)
Potassium: 5.2 mmol/L — ABNORMAL HIGH (ref 3.5–5.1)
Sodium: 136 mmol/L (ref 135–145)

## 2020-02-13 LAB — ECHOCARDIOGRAM COMPLETE
AR max vel: 3.68 cm2
AV Area VTI: 3.65 cm2
AV Area mean vel: 3.46 cm2
AV Mean grad: 4 mmHg
AV Peak grad: 8.4 mmHg
Ao pk vel: 1.45 m/s
Area-P 1/2: 5.78 cm2
Height: 62 in
S' Lateral: 3.97 cm
Weight: 6116.44 oz

## 2020-02-13 LAB — TSH: TSH: 0.659 u[IU]/mL (ref 0.350–4.500)

## 2020-02-13 MED ORDER — FLUTICASONE-UMECLIDIN-VILANT 100-62.5-25 MCG/INH IN AEPB: 1.0000 | INHALATION_SPRAY | Freq: Every day | RESPIRATORY_TRACT | Status: DC

## 2020-02-13 MED ORDER — FLUTICASONE FUROATE-VILANTEROL 100-25 MCG/INH IN AEPB
1.0000 | INHALATION_SPRAY | Freq: Every day | RESPIRATORY_TRACT | Status: DC
  Administered 2020-02-14 – 2020-02-20 (×7): 1 via RESPIRATORY_TRACT
  Filled 2020-02-13: qty 28

## 2020-02-13 MED ORDER — UMECLIDINIUM BROMIDE 62.5 MCG/INH IN AEPB
1.0000 | INHALATION_SPRAY | Freq: Every day | RESPIRATORY_TRACT | Status: DC
  Administered 2020-02-14 – 2020-02-20 (×7): 1 via RESPIRATORY_TRACT
  Filled 2020-02-13: qty 7

## 2020-02-13 MED ORDER — INFLUENZA VAC SPLIT QUAD 0.5 ML IM SUSY: 0.5000 mL | PREFILLED_SYRINGE | INTRAMUSCULAR | Status: DC

## 2020-02-13 MED ORDER — IPRATROPIUM-ALBUTEROL 0.5-2.5 (3) MG/3ML IN SOLN
3.0000 mL | Freq: Four times a day (QID) | RESPIRATORY_TRACT | Status: DC
  Administered 2020-02-13 – 2020-02-14 (×4): 3 mL via RESPIRATORY_TRACT
  Filled 2020-02-13 (×4): qty 3

## 2020-02-13 MED ORDER — PERFLUTREN LIPID MICROSPHERE
1.0000 mL | INTRAVENOUS | Status: AC | PRN
  Administered 2020-02-13: 2 mL via INTRAVENOUS
  Filled 2020-02-13: qty 10

## 2020-02-13 MED ORDER — METHYLPREDNISOLONE SODIUM SUCC 40 MG IJ SOLR
40.0000 mg | Freq: Two times a day (BID) | INTRAMUSCULAR | Status: AC
  Administered 2020-02-14 (×2): 40 mg via INTRAVENOUS
  Filled 2020-02-13 (×2): qty 1

## 2020-02-13 MED ORDER — VITAMIN B-12 1000 MCG PO TABS
2000.0000 ug | ORAL_TABLET | Freq: Every day | ORAL | Status: DC
  Administered 2020-02-13 – 2020-02-20 (×8): 2000 ug via ORAL
  Filled 2020-02-13 (×8): qty 2

## 2020-02-13 NOTE — Progress Notes (Signed)
PROGRESS NOTE    Katherine DenverKay H Geving   ZOX:[id number]  DOB: 25-May-1960  PCP: Smitty CordsKaramalegos, Alexander J, DO    DOA: 02/12/2020 LOS: 1   Brief Narrative   Katherine Scott is a 59 y.o. female with medical history significant of persistent atrial fibrillation on Eliquis, COPD, morbid obesity, lymphedema, OSA on CPAP at night, chronic hypoxic respiratory failure on 2 L/min oxygen who presented to the ED from home via EMS on 02/12/20 due to worsening shortness of breath and increased oxygen requirements, productive cough.  She was in respiratory distress on arrival and placed on BiPAP in the ED.  Admitted for multifactorial acute on chronic respiratory failure with hypoxia secondary to pulmonary edema, exacerbation of COPD and possible pneumonia.     Assessment & Plan   Principal Problem:   Acute on chronic respiratory failure with hypoxia (HCC) Active Problems:   COPD with acute exacerbation (HCC)   Pulmonary edema   Persistent atrial fibrillation (HCC)   Acquired hypothyroidism   BMI 60.0-69.9, adult (HCC)   Lymphedema   Shortness of breath   Acute on chronic respiratory failure with hypoxia -at baseline uses 1 L/min, presented with dyspnea and requiring BiPAP, requiring 4 L/min at home continuously for the past couple days.  Chest x-ray with diffuse bilateral opacities which could reflect pulmonary edema or pneumonia.  Procal negative.  D-dimer elevated but can be explained by OSA / OHS and ?pulmonary hypertension.  Degree of hypoxia consistent with her chest xray, so low suspicion for PE.  Bilateral LE dopplers negative for DVT's bilaterally.  Pulmonary Edema - no prior CHF diagnosis, suspect pulmonary hypertension.  BNP was mildly elevated. Echo 11/5 - EF 45-50%, normal diastolic function, mild MR.  COPD with acute exacerbation - ruled out.  Patient does not have diagnosis COPD after further chart review including PFT's and recent pulmonology notes.  She did present with diffuse expiratory  wheezing and diminished aeration which has improved with IV steroids.  Has been on steroids outpatient, tapering down, and pt reports worsening SOB at lower current dose.  Not clear amiodarone toxicity still a factor.  Pulmonary edema - pt denies prior hx of CHF, no prior echo in [facility].  Suspect possible pulmonary hypertension given underlying OSA, OHS. Shortness of breath - POA, due to above.  11/5 - repeat CXR with improved opacities after diuresis PLAN: --Consult pulmonology, consider cardiology --Mildly reduced EF, consider transition of CCB --Stop Rocephin but will continue Zithromax  --IV steroids: Solu-Medrol reduce to 40 mg IV BID --Scheduled DuoNebs every 6 hours while awake --As needed albuterol nebs --Continue formulary equivalents for Trelegy --Schedule Mucinex BID --Tylenol as needed fevers   Persistent atrial fibrillation with RVR -presented with rapid A. fib initial heart rate 131, EKG confirming A. fib with rate of 114 in the ED.  Potentially contributing to pulmonary edema. --Continue Eliquis, diltiazem  --Telemetry --Monitor K, Mg  Acquired hypothyroidism -continue levothyroxine.  TSH normal 0.659.  OSA - CPAP ordered  Morbid obesity - Body mass index is 68.41 kg/m.  Complicates overall care and prognosis.    Lymphedema -appears stable.  Monitor.  Diuresis as above.    Recent lower extremity cellulitis -completed course of Bactrim.      DVT prophylaxis:  apixaban (ELIQUIS) tablet 5 mg   Diet:  Diet Orders (From admission, onward)    Start     Ordered   02/12/20 1539  Diet Heart Room service appropriate? Yes; Fluid consistency: Thin  Diet effective now  Question Answer Comment  Room service appropriate? Yes   Fluid consistency: Thin      02/12/20 1543            Code Status: Full Code    Subjective 02/13/20    Pt seen at bedside.  Reports feeling better today.  Gets short of breath with moving around in bed, but not at rest.  No  fever/chills.  Reports high urine output with diuresis.   Disposition Plan & Communication   Status is: Inpatient  Inpatient status remains appropriate due to severity of illness and requirement for IV medications as above.  Patient continues to have high oxygen requirement on 15 L/min HFNC today.  Dispo: The patient is from: home              Anticipated d/c is to: home              Anticipated d/c date is: 3 days              Medically stable for d/c?: No    Family Communication: none at bedside.  Pt able to update.    Consults, Procedures, Significant Events   Consultants:   None  Procedures:   Echo 02/13/20  Antimicrobials:  Anti-infectives (From admission, onward)   Start     Dose/Rate Route Frequency Ordered Stop   02/12/20 1615  cefTRIAXone (ROCEPHIN) 2 g in sodium chloride 0.9 % 100 mL IVPB  Status:  Discontinued        2 g 200 mL/hr over 30 Minutes Intravenous Every 24 hours 02/12/20 1609 02/12/20 1610   02/12/20 1615  azithromycin (ZITHROMAX) tablet 500 mg  Status:  Discontinued        500 mg Oral Daily 02/12/20 1609 02/12/20 1610   02/12/20 1515  cefTRIAXone (ROCEPHIN) 2 g in sodium chloride 0.9 % 100 mL IVPB  Status:  Discontinued        2 g 200 mL/hr over 30 Minutes Intravenous Every 24 hours 02/12/20 1509 02/13/20 0907   02/12/20 1515  azithromycin (ZITHROMAX) 500 mg in sodium chloride 0.9 % 250 mL IVPB        500 mg 250 mL/hr over 60 Minutes Intravenous Every 24 hours 02/12/20 1509 02/16/20 2359        Objective   Vitals:   02/13/20 1123 02/13/20 1332 02/13/20 1544 02/13/20 1549  BP: 129/75  123/63   Pulse: (!) 110 (!) 104 (!) 109 (!) 118  Resp: 17 20 18    Temp: 98.2 F (36.8 C)  98.7 F (37.1 C)   TempSrc: Oral  Oral   SpO2: (!) 89% 95% (!) 89% (!) 88%  Weight:      Height:        Intake/Output Summary (Last 24 hours) at 02/13/2020 1920 Last data filed at 02/13/2020 1435 Gross per 24 hour  Intake 1690 ml  Output 600 ml  Net 1090 ml    Filed Weights   02/12/20 1254 02/13/20 0345  Weight: (!) 169.6 kg (!) 173.4 kg    Physical Exam:  General exam: awake, alert, no acute distress, obese Respiratory system: improved aeration, no expiratory wheezes heard, diminished bases, mildly increased respiratory effort sitting up in bed, on 15 L/min HFNC oxygen. Cardiovascular system: normal S1/S2, RRR, unable to assess JVD, no murmurs, rubs, gallops, 3+ edema of bilateral LE's. Gastrointestinal system: soft, NT, ND Central nervous system: A&O x4. no gross focal neurologic deficits, normal speech Extremities: moves all, bilateral LE's with 2-3+ pitting edema,  stable erythema on distal lower extremities Psychiatry: normal mood, congruent affect, judgement and insight appear normal  Labs   Data Reviewed: I have personally reviewed following labs and imaging studies  CBC: Recent Labs  Lab 02/12/20 1440 02/13/20 0508  WBC 12.6* 9.0  NEUTROABS 11.8*  --   HGB 12.2 11.3*  HCT 39.7 37.0  MCV 94.7 93.9  PLT 204 210   Basic Metabolic Panel: Recent Labs  Lab 02/12/20 1258 02/13/20 0508  NA 137 136  K 4.7 5.2*  CL 95* 97*  CO2 32 29  GLUCOSE 130* 171*  BUN 19 26*  CREATININE 1.02* 1.10*  CALCIUM 8.8* 8.8*   GFR: Estimated Creatinine Clearance: 86.4 mL/min (A) (by C-G formula based on SCr of 1.1 mg/dL (H)). Liver Function Tests: Recent Labs  Lab 02/12/20 1258  AST 15  ALT 17  ALKPHOS 63  BILITOT 1.5*  PROT 7.3  ALBUMIN 3.5   No results for input(s): LIPASE, AMYLASE in the last 168 hours. No results for input(s): AMMONIA in the last 168 hours. Coagulation Profile: No results for input(s): INR, PROTIME in the last 168 hours. Cardiac Enzymes: No results for input(s): CKTOTAL, CKMB, CKMBINDEX, TROPONINI in the last 168 hours. BNP (last 3 results) No results for input(s): PROBNP in the last 8760 hours. HbA1C: No results for input(s): HGBA1C in the last 72 hours. CBG: No results for input(s): GLUCAP in the  last 168 hours. Lipid Profile: No results for input(s): CHOL, HDL, LDLCALC, TRIG, CHOLHDL, LDLDIRECT in the last 72 hours. Thyroid Function Tests: Recent Labs    02/13/20 0508  TSH 0.659   Anemia Panel: No results for input(s): VITAMINB12, FOLATE, FERRITIN, TIBC, IRON, RETICCTPCT in the last 72 hours. Sepsis Labs: Recent Labs  Lab 02/12/20 1258 02/12/20 1533  PROCALCITON  --  0.10  LATICACIDVEN 1.5  --     Recent Results (from the past 240 hour(s))  Respiratory Panel by RT PCR (Flu A&B, Covid) - Nasopharyngeal Swab     Status: None   Collection Time: 02/12/20 12:58 PM   Specimen: Nasopharyngeal Swab  Result Value Ref Range Status   SARS Coronavirus 2 by RT PCR NEGATIVE NEGATIVE Final    Comment: (NOTE) SARS-CoV-2 target nucleic acids are NOT DETECTED.  The SARS-CoV-2 RNA is generally detectable in upper respiratoy specimens during the acute phase of infection. The lowest concentration of SARS-CoV-2 viral copies this assay can detect is 131 copies/mL. A negative result does not preclude SARS-Cov-2 infection and should not be used as the sole basis for treatment or other patient management decisions. A negative result may occur with  improper specimen collection/handling, submission of specimen other than nasopharyngeal swab, presence of viral mutation(s) within the areas targeted by this assay, and inadequate number of viral copies (<131 copies/mL). A negative result must be combined with clinical observations, patient history, and epidemiological information. The expected result is Negative.  Fact Sheet for Patients:  https://www.moore.com/  Fact Sheet for Healthcare Providers:  https://www.young.biz/  This test is no t yet approved or cleared by the [place] FDA and  has been authorized for detection and/or diagnosis of SARS-CoV-2 by FDA under an Emergency Use Authorization (EUA). This EUA will remain  in effect (meaning  this test can be used) for the duration of the COVID-19 declaration under Section 564(b)(1) of the Act, 21 U.S.C. section 360bbb-3(b)(1), unless the authorization is terminated or revoked sooner.     Influenza A by PCR NEGATIVE NEGATIVE Final   Influenza B by  PCR NEGATIVE NEGATIVE Final    Comment: (NOTE) The Xpert Xpress SARS-CoV-2/FLU/RSV assay is intended as an aid in  the diagnosis of influenza from Nasopharyngeal swab specimens and  should not be used as a sole basis for treatment. Nasal washings and  aspirates are unacceptable for Xpert Xpress SARS-CoV-2/FLU/RSV  testing.  Fact Sheet for Patients: https://www.moore.com/  Fact Sheet for Healthcare Providers: https://www.young.biz/  This test is not yet approved or cleared by the [place] FDA and  has been authorized for detection and/or diagnosis of SARS-CoV-2 by  FDA under an Emergency Use Authorization (EUA). This EUA will remain  in effect (meaning this test can be used) for the duration of the  Covid-19 declaration under Section 564(b)(1) of the Act, 21  U.S.C. section 360bbb-3(b)(1), unless the authorization is  terminated or revoked. Performed at [facility], [address] [place], [place] [address]   Culture, blood (single)     Status: None (Preliminary result)   Collection Time: 02/12/20  3:32 PM   Specimen: BLOOD  Result Value Ref Range Status   Specimen Description BLOOD BLOOD LEFT FOREARM  Final   Special Requests   Final    BOTTLES DRAWN AEROBIC AND ANAEROBIC Blood Culture adequate volume   Culture   Final    NO GROWTH < 24 HOURS Performed at [facility], [address] [place], [place] [address]    Report Status PENDING  Incomplete      Imaging Studies   US Venous Img Lower Bilateral (DVT)  Result Date: 02/13/2020 CLINICAL DATA:  Bilateral lower extremity pain and edema. History of cellulitis affecting the lower legs the past  several months. Evaluate for DVT. EXAM: BILATERAL LOWER EXTREMITY VENOUS DOPPLER ULTRASOUND TECHNIQUE: Gray-scale sonography with graded compression, as well as color Doppler and duplex ultrasound were performed to evaluate the lower extremity deep venous systems from the level of the common femoral vein and including the common femoral, femoral, profunda femoral, popliteal and calf veins including the posterior tibial, peroneal and gastrocnemius veins when visible. The superficial great saphenous vein was also interrogated. Spectral Doppler was utilized to evaluate flow at rest and with distal augmentation maneuvers in the common femoral, femoral and popliteal veins. COMPARISON:  None. FINDINGS: Examination is degraded due to patient body habitus and poor sonographic window. RIGHT LOWER EXTREMITY Common Femoral Vein: No evidence of thrombus. Normal compressibility, respiratory phasicity and response to augmentation. Saphenofemoral Junction: No evidence of thrombus. Normal compressibility and flow on color Doppler imaging. Profunda Femoral Vein: No evidence of thrombus. Normal compressibility and flow on color Doppler imaging. Femoral Vein: No evidence of thrombus. Normal compressibility, respiratory phasicity and response to augmentation. Popliteal Vein: No evidence of thrombus. Normal compressibility, respiratory phasicity and response to augmentation. Calf Veins: No evidence of thrombus. Normal compressibility and flow on color Doppler imaging. Superficial Great Saphenous Vein: No evidence of thrombus. Normal compressibility. Venous Reflux:  None. Other Findings: There is a minimal amount of subcutaneous edema the level of the right calf (image 33). LEFT LOWER EXTREMITY Common Femoral Vein: No evidence of thrombus. Normal compressibility, respiratory phasicity and response to augmentation. Saphenofemoral Junction: No evidence of thrombus. Normal compressibility and flow on color Doppler imaging. Profunda Femoral  Vein: No evidence of thrombus. Normal compressibility and flow on color Doppler imaging. Femoral Vein: No evidence of thrombus. Normal compressibility, respiratory phasicity and response to augmentation. Popliteal Vein: No evidence of thrombus. Normal compressibility, respiratory phasicity and response to augmentation. Calf Veins: No evidence of thrombus.  Normal compressibility and flow on color Doppler imaging. Superficial Great Saphenous Vein: No evidence of thrombus. Normal compressibility. Venous Reflux:  None. Other Findings: There is a minimal amount of subcutaneous edema at the level of the left calf (image 64). IMPRESSION: No evidence of DVT within either lower extremity. Electronically Signed   By: Simonne Come M.D.   On: 02/13/2020 12:16   DG Chest Port 1 View  Result Date: 02/13/2020 CLINICAL DATA:  Acute on chronic respiratory failure EXAM: PORTABLE CHEST 1 VIEW COMPARISON:  Yesterday FINDINGS: Cardiomegaly with edema and diffuse vascular enlargement. There has been improvement since yesterday in the degree of pulmonary opacification. No visible effusion or pneumothorax. IMPRESSION: Improved CHF. Electronically Signed   By: Marnee Spring M.D.   On: 02/13/2020 07:34   DG Chest Portable 1 View  Result Date: 02/12/2020 CLINICAL DATA:  Shortness of breath EXAM: PORTABLE CHEST 1 VIEW COMPARISON:  06/17/2019 FINDINGS: Bilateral pulmonary opacities. Possible pleural effusions. Cardiomediastinal contours are obscured. There is cardiomegaly. No pneumothorax. IMPRESSION: Bilateral pulmonary opacities with cardiomegaly and possible pleural effusions. Favored to reflect pulmonary edema although pneumonia is not excluded. Electronically Signed   By: Guadlupe Spanish M.D.   On: 02/12/2020 13:41   ECHOCARDIOGRAM COMPLETE  Result Date: 02/13/2020    ECHOCARDIOGRAM REPORT   Patient Name:   Katherine Scott Date of Exam: 02/13/2020 Medical Rec #:  [id number]    Height:       62.0 in Accession #:    [id number]    Weight:       382.3 lb Date of Birth:  [birth year]    BSA:          2.518 m Patient Age:    59 years     BP:           132/83 mmHg Patient Gender: F            HR:           115 bpm. Exam Location:  [facility] Procedure: 2D Echo, Color Doppler, Cardiac Doppler and Intracardiac            Opacification Agent Indications:     I48.91 Atrial fibrillation  History:         Patient has no prior history of Echocardiogram examinations.                  COPD; Risk Factors:Hypertension and Sleep Apnea.  Sonographer:     Humphrey Rolls RDCS (AE) Referring Phys:  [id number] Tresa Endo A Rasheida Broden Diagnosing Phys: Marcina Millard MD  Sonographer Comments: Technically difficult study due to poor echo windows. Image acquisition challenging due to patient body habitus and Image acquisition challenging due to COPD. IMPRESSIONS  1. Left ventricular ejection fraction, by estimation, is 45 to 50%. The left ventricle has mildly decreased function. The left ventricle has no regional wall motion abnormalities. Left ventricular diastolic parameters were normal.  2. Right ventricular systolic function is normal. The right ventricular size is normal.  3. The mitral valve is normal in structure. Mild mitral valve regurgitation. No evidence of mitral stenosis.  4. The aortic valve is normal in structure. Aortic valve regurgitation is not visualized. No aortic stenosis is present.  5. The inferior vena cava is normal in size with greater than 50% respiratory variability, suggesting right atrial pressure of 3 mmHg. FINDINGS  Left Ventricle: Left ventricular ejection fraction, by estimation, is 45 to 50%. The left ventricle has mildly decreased function. The left ventricle has no regional  wall motion abnormalities. Definity contrast agent was given IV to delineate the left ventricular endocardial borders. The left ventricular internal cavity size was normal in size. There is no left ventricular hypertrophy. Left ventricular diastolic parameters were normal.  Right Ventricle: The right ventricular size is normal. No increase in right ventricular wall thickness. Right ventricular systolic function is normal. Left Atrium: Left atrial size was normal in size. Right Atrium: Right atrial size was normal in size. Pericardium: There is no evidence of pericardial effusion. Mitral Valve: The mitral valve is normal in structure. Mild mitral valve regurgitation. No evidence of mitral valve stenosis. MV peak gradient, 10.0 mmHg. The mean mitral valve gradient is 5.0 mmHg. Tricuspid Valve: The tricuspid valve is normal in structure. Tricuspid valve regurgitation is mild . No evidence of tricuspid stenosis. Aortic Valve: The aortic valve is normal in structure. Aortic valve regurgitation is not visualized. No aortic stenosis is present. Aortic valve mean gradient measures 4.0 mmHg. Aortic valve peak gradient measures 8.4 mmHg. Aortic valve area, by VTI measures 3.65 cm. Pulmonic Valve: The pulmonic valve was normal in structure. Pulmonic valve regurgitation is not visualized. No evidence of pulmonic stenosis. Aorta: The aortic root is normal in size and structure. Venous: The inferior vena cava is normal in size with greater than 50% respiratory variability, suggesting right atrial pressure of 3 mmHg. IAS/Shunts: No atrial level shunt detected by color flow Doppler.  LEFT VENTRICLE PLAX 2D LVIDd:         5.14 cm  Diastology LVIDs:         3.97 cm  LV e' medial:    9.79 cm/s LV PW:         1.25 cm  LV E/e' medial:  12.3 LV IVS:        1.25 cm  LV e' lateral:   11.00 cm/s LVOT diam:     2.40 cm  LV E/e' lateral: 11.0 LV SV:         81 LV SV Index:   32 LVOT Area:     4.52 cm  LEFT ATRIUM         Index LA diam:    3.80 cm 1.51 cm/m  AORTIC VALVE                   PULMONIC VALVE AV Area (Vmax):    3.68 cm    PV Vmax:       1.33 m/s AV Area (Vmean):   3.46 cm    PV Vmean:      86.100 cm/s AV Area (VTI):     3.65 cm    PV VTI:        0.301 m AV Vmax:           145.00 cm/s PV Peak  grad:  7.1 mmHg AV Vmean:          91.200 cm/s PV Mean grad:  3.0 mmHg AV VTI:            0.223 m AV Peak Grad:      8.4 mmHg AV Mean Grad:      4.0 mmHg LVOT Vmax:         118.00 cm/s LVOT Vmean:        69.700 cm/s LVOT VTI:          0.180 m LVOT/AV VTI ratio: 0.81  AORTA Ao Root diam: 3.10 cm MITRAL VALVE MV Area (PHT): 5.78 cm     SHUNTS MV Peak grad:  10.0 mmHg    Systemic VTI:  0.18 m MV Mean grad:  5.0 mmHg     Systemic Diam: 2.40 cm MV Vmax:       1.58 m/s MV Vmean:      106.0 cm/s MV Decel Time: 131 msec MV E velocity: 120.67 cm/s Marcina Millard MD Electronically signed by Marcina Millard MD Signature Date/Time: 02/13/2020/1:44:18 PM    Final      Medications   Scheduled Meds: . apixaban  5 mg Oral BID  . diltiazem  240 mg Oral QPC breakfast  . furosemide  40 mg Intravenous BID  . guaiFENesin  600 mg Oral BID  . [START ON 02/14/2020] influenza vac split quadrivalent PF  0.5 mL Intramuscular Tomorrow-1000  . ipratropium-albuterol  3 mL Nebulization Q6H  . levothyroxine  75 mcg Oral Q0600  . methylPREDNISolone (SOLU-MEDROL) injection  60 mg Intravenous Q12H   Continuous Infusions: . azithromycin 500 mg (02/13/20 1435)       LOS: 1 day    Time spent: 30 minutes    Pennie Banter, DO [facility]  02/13/2020, 7:20 PM    If 7PM-7AM, please contact night-coverage. How to contact the [facility] Attending or Consulting provider 7A - 7P or covering provider during after hours 7P -7A, for this patient?    1. Check the care team in [facility] and look for a) attending/consulting [facility] provider listed and b) the [facility] team listed 2. Log into [email or web address] and use [place]'s universal password to access. If you do not have the password, please contact the hospital operator. 3. Locate the [facility] provider you are looking for under [facility] and page to a number that you can be directly reached. 4. If you still have difficulty reaching the provider, please page the [facility]  (Director on Call) for the Hospitalists listed on amion for assistance.

## 2020-02-13 NOTE — Plan of Care (Signed)
Nutrition Education Note  RD consulted for nutrition education regarding CHF.  59 y.o. female with medical history significant of persistent atrial fibrillation on Eliquis, COPD, morbid obesity, lymphedema, OSA on CPAP at night, chronic hypoxemic respiratory failure on 2 L of oxygen who presented to the ED from home via EMS due to worsening shortness of breath and increased oxygen requirements.   RD provided "Low Sodium Nutrition Therapy" handout from the Academy of [employer]. Reviewed patient's dietary recall. Provided examples on ways to decrease sodium intake in diet. Discouraged intake of processed foods and use of salt shaker. Encouraged fresh fruits and vegetables as well as whole grain sources of carbohydrates to maximize fiber intake.   RD discussed why it is important for patient to adhere to diet recommendations, and emphasized the role of fluids, foods to avoid, and importance of weighing self daily. Teach back method used.  Expect good compliance.  Body mass index is 69.92 kg/m. Pt meets criteria for morbid obesity based on current BMI.  Current diet order is HH, patient is consuming approximately 100% of meals at this time. Labs and medications reviewed. No further nutrition interventions warranted at this time. RD contact information provided. If additional nutrition issues arise, please re-consult RD.   Betsey Holiday MS, RD, LDN Please refer to [facility] for RD and/or RD on-call/weekend/after hours pager

## 2020-02-13 NOTE — Plan of Care (Signed)

## 2020-02-13 NOTE — Evaluation (Signed)
Physical Therapy Evaluation Patient Details Name: Katherine Scott MRN: [id number] DOB: [birth year] Today's Date: 02/13/2020   History of Present Illness  Brighton Delio is a 59yoF who comes to [facility] on 11/4 c SOB adn increased supplemental O2 requirements. PMH inclusive of AF on eloquis, COPD, morbid obesity, lymphedema, OSA on CPAP, CRF on 2L continuous. Pt reports recent fevers, but in ED COVID adn FLu tests are (-).  Clinical Impression  Pt admitted with above diagnosis. Pt currently with functional limitations due to the deficits listed below (see "PT Problem List"). Upon entry, pt in bed, awake and agreeable to participate. The pt is alert and oriented x4, pleasant, conversational, and generally a good historian. Pt assisted with sliding cranially in bed, requires [place] of bed, with terminal saturation of ~83% on 15L, requires 8 minutes to return to 88%. All other mobility deferred until O2 needs are more easily met with exertion. BLE erythema from ankles to mid calf, pitting edema. Functional mobility assessment demonstrates increased effort/time requirements, poor tolerance, and need for physical assistance, whereas the patient performed these at a higher level of independence PTA. OOB assessment to come once appropriate. Pt will benefit from skilled PT intervention to increase independence and safety with basic mobility in preparation for discharge to the venue listed below.       Follow Up Recommendations SNF;Supervision for mobility/OOB    Equipment Recommendations  None recommended by PT    Recommendations for Other Services       Precautions / Restrictions Precautions Precautions: Fall Restrictions Weight Bearing Restrictions: No      Mobility  Bed Mobility               General bed mobility comments: able to scoot cranially with trendelenburg bed, but desaturates with very drawn out recovery period. All other mobility deferred due to desaturation.    Transfers                     Ambulation/Gait                Stairs            Wheelchair Mobility    Modified Rankin (Stroke Patients Only)       Balance                                             Pertinent Vitals/Pain Pain Assessment: No/denies pain    Home Living Family/patient expects to be discharged to:: Private residence Living Arrangements: Alone Available Help at Discharge: Family;Available PRN/intermittently (2 sons in the county, sister lives on the block) Type of Home: House Home Access: Stairs to enter Entrance Stairs-Rails: Left Entrance Stairs-Number of Steps: 4 Home Layout: Multi-level Home Equipment: Cane - single point (hurrycane for community AMB; no device in home, intermitten tfurniture walking) Additional Comments: limited to mostly household distance AMB, ablke to get around [employer] with breaks.    Prior Function           Comments: Pt has most food./meals delivered; still drives prn     Hand Dominance   Dominant Hand: Right    Extremity/Trunk Assessment   Upper Extremity Assessment Upper Extremity Assessment: Overall WFL for tasks assessed;Generalized weakness    Lower Extremity Assessment Lower Extremity Assessment: Overall WFL for tasks assessed;Generalized weakness    Cervical / Trunk Assessment Cervical / Trunk  Assessment: Normal  Communication      Cognition Arousal/Alertness: Awake/alert Behavior During Therapy: WFL for tasks assessed/performed Overall Cognitive Status: Within Functional Limits for tasks assessed                                        General Comments      Exercises General Exercises - Lower Extremity Ankle Circles/Pumps: AROM;10 reps;Both Short Arc Quad: AROM;Both;10 reps   Assessment/Plan    PT Assessment Patient needs continued PT services  PT Problem List Decreased activity tolerance;Decreased mobility;Cardiopulmonary status limiting  activity       PT Treatment Interventions DME instruction;Balance training;Functional mobility training;Stair training;Gait training;Therapeutic exercise;Therapeutic activities;Patient/family education    PT Goals (Current goals can be found in the Care Plan section)  Acute Rehab PT Goals Patient Stated Goal: regain independent with mobility PT Goal Formulation: With patient Time For Goal Achievement: 02/27/20 Potential to Achieve Goals: Good    Frequency Min 2X/week   Barriers to discharge        Co-evaluation               AM-PAC PT "6 Clicks" Mobility  Outcome Measure Help needed turning from your back to your side while in a flat bed without using bedrails?: A Lot Help needed moving from lying on your back to sitting on the side of a flat bed without using bedrails?: A Lot Help needed moving to and from a bed to a chair (including a wheelchair)?: A Lot Help needed standing up from a chair using your arms (e.g., wheelchair or bedside chair)?: A Lot Help needed to walk in hospital room?: A Lot Help needed climbing 3-5 steps with a railing? : A Lot 6 Click Score: 12    End of Session Equipment Utilized During Treatment: Oxygen Activity Tolerance: Patient tolerated treatment well;Treatment limited secondary to medical complications (Comment) Patient left: in bed;with call bell/phone within reach Nurse Communication: Mobility status PT Visit Diagnosis: Difficulty in walking, not elsewhere classified (R26.2);Muscle weakness (generalized) (M62.81)    Time: 9842-1031 PT Time Calculation (min) (ACUTE ONLY): 25 min   Charges:   PT Evaluation $PT Eval Moderate Complexity: 1 Mod         3:59 PM, 02/13/20 Etta Grandchild, PT, DPT Physical Therapist - [facility]  [phone] ([place])    Javan Gonzaga C 02/13/2020, 3:54 PM

## 2020-02-13 NOTE — Progress Notes (Signed)
*  PRELIMINARY RESULTS* Echocardiogram 2D Echocardiogram has been performed.  Katherine Scott 02/13/2020, 9:45 AM

## 2020-02-13 NOTE — Hospital Course (Signed)
Katherine Scott is a 59 y.o. female with medical history significant of persistent atrial fibrillation on Eliquis, COPD, morbid obesity, lymphedema, OSA on CPAP at night, chronic hypoxic respiratory failure on 2 L/min oxygen who presented to the ED from home via EMS on 02/12/20 due to worsening shortness of breath and increased oxygen requirements, productive cough.  She was in respiratory distress on arrival and placed on BiPAP in the ED.  Admitted for multifactorial acute on chronic respiratory failure with hypoxia secondary to pulmonary edema, exacerbation of COPD and possible pneumonia.

## 2020-02-13 NOTE — Progress Notes (Signed)
OT Cancellation Note  Patient Details Name: Katherine Scott MRN: [id number] DOB: [birth year]   Cancelled Treatment:    Reason Eval/Treat Not Completed: Patient at procedure or test/ unavailable. OT order receievd and chart reviewed. Pt pending LE imaging to r/u DV, will hold OT evaluation and initiate at later date/time as able.   [name], M.S. OTR/L  02/13/20, 10:40 AM  ascom [phone]

## 2020-02-14 DIAGNOSIS — I890 Lymphedema, not elsewhere classified: Secondary | ICD-10-CM

## 2020-02-14 LAB — BASIC METABOLIC PANEL
Anion gap: 10 (ref 5–15)
BUN: 37 mg/dL — ABNORMAL HIGH (ref 6–20)
CO2: 33 mmol/L — ABNORMAL HIGH (ref 22–32)
Calcium: 9.2 mg/dL (ref 8.9–10.3)
Chloride: 94 mmol/L — ABNORMAL LOW (ref 98–111)
Creatinine, Ser: 1.1 mg/dL — ABNORMAL HIGH (ref 0.44–1.00)
GFR, Estimated: 58 mL/min — ABNORMAL LOW (ref >60)
Glucose, Bld: 178 mg/dL — ABNORMAL HIGH (ref 70–99)
Potassium: 4.6 mmol/L (ref 3.5–5.1)
Sodium: 137 mmol/L (ref 135–145)

## 2020-02-14 LAB — CBC
HCT: 36.2 % (ref 36.0–46.0)
Hemoglobin: 11 g/dL — ABNORMAL LOW (ref 12.0–15.0)
MCH: 29 pg (ref 26.0–34.0)
MCHC: 30.4 g/dL (ref 30.0–36.0)
MCV: 95.5 fL (ref 80.0–100.0)
Platelets: 229 10*3/uL (ref 150–400)
RBC: 3.79 MIL/uL — ABNORMAL LOW (ref 3.87–5.11)
RDW: 15.9 % — ABNORMAL HIGH (ref 11.5–15.5)
WBC: 9.9 10*3/uL (ref 4.0–10.5)
nRBC: 0.4 % — ABNORMAL HIGH (ref 0.0–0.2)

## 2020-02-14 LAB — MAGNESIUM: Magnesium: 2.6 mg/dL — ABNORMAL HIGH (ref 1.7–2.4)

## 2020-02-14 MED ORDER — METOPROLOL SUCCINATE ER 50 MG PO TB24
50.0000 mg | ORAL_TABLET | Freq: Every day | ORAL | Status: DC
  Administered 2020-02-15: 50 mg via ORAL
  Filled 2020-02-14: qty 1

## 2020-02-14 MED ORDER — IPRATROPIUM-ALBUTEROL 0.5-2.5 (3) MG/3ML IN SOLN
3.0000 mL | Freq: Two times a day (BID) | RESPIRATORY_TRACT | Status: DC
  Administered 2020-02-14 – 2020-02-15 (×2): 3 mL via RESPIRATORY_TRACT
  Filled 2020-02-14 (×2): qty 3

## 2020-02-14 MED ORDER — PREDNISONE 20 MG PO TABS
40.0000 mg | ORAL_TABLET | Freq: Every day | ORAL | Status: DC
  Administered 2020-02-15: 40 mg via ORAL
  Filled 2020-02-14: qty 2

## 2020-02-14 NOTE — Progress Notes (Addendum)
PROGRESS NOTE    Katherine Scott   VOJ:[id number]  DOB: 1960-05-06  PCP: Smitty Cords, DO    DOA: 02/12/2020 LOS: 2   Brief Narrative   Katherine Scott is a 59 y.o. female with medical history significant of persistent atrial fibrillation on Eliquis, COPD, morbid obesity, lymphedema, OSA on CPAP at night, chronic hypoxic respiratory failure on 2 L/min oxygen who presented to the ED from home via EMS on 02/12/20 due to worsening shortness of breath and increased oxygen requirements, productive cough.  She was in respiratory distress on arrival and placed on BiPAP in the ED.  Admitted for multifactorial acute on chronic respiratory failure with hypoxia secondary to pulmonary edema, exacerbation of COPD and possible pneumonia.     Assessment & Plan   Principal Problem:   Acute on chronic respiratory failure with hypoxia (HCC) Active Problems:   COPD with acute exacerbation (HCC)   Pulmonary edema   Persistent atrial fibrillation (HCC)   Acquired hypothyroidism   BMI 60.0-69.9, adult (HCC)   Lymphedema   Shortness of breath   Acute on chronic respiratory failure with hypoxia -at baseline uses 1 L/min, presented with dyspnea and requiring BiPAP, requiring 4 L/min at home continuously for the past couple days.  Chest x-ray with diffuse bilateral opacities which could reflect pulmonary edema or pneumonia.  Procal negative.  D-dimer elevated but can be explained by OSA / OHS and ?pulmonary hypertension.  Degree of hypoxia consistent with her chest xray, so low suspicion for PE.  Bilateral LE dopplers negative for DVT's bilaterally.  Pulmonary Edema - no prior CHF diagnosis, suspect pulmonary hypertension contributing.  BNP was mildly elevated. Echo 11/5 - EF 45-50%, normal diastolic function, mild MR.   Suspect possible pulmonary hypertension contributing given underlying OSA (prior CT chest showed enlarged pulmonary a.).  COPD with acute exacerbation - ruled out.  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   Patient does not have diagnosis COPD after further chart review including PFT's and recent pulmonology notes.  She did present with diffuse expiratory wheezing and diminished aeration which has improved with IV steroids.  Has been on steroids outpatient, tapering off recently, using intermittently when more SOB.  Not clear amiodarone toxicity still a factor.  Pulmonology notes & prior imaging briefly reviewed.    Shortness of breath - POA, due to above.  11/5 - repeat CXR with improved opacities after diuresis 11/6 - improving edema and O2 requirement down to 10 from 15 L/min HFNC PLAN: --Consult [facility] cardiology (pt sees Dr. Lady Gary) --Close pulmonology follow up outpatient --Mildly reduced EF, consider transition of CCB --Continue Zithromax x 5 day course --IV steroids: Solu-Medrol reduce to 40 mg IV BID --Scheduled DuoNebs every 6 hours while awake --As needed albuterol nebs --Continue formulary equivalents for Trelegy (Breo & Incruse ellipta) --Schedule Mucinex BID --Tylenol as needed fevers   Persistent atrial fibrillation with RVR -presented with rapid A. fib initial heart rate 131, EKG confirming A. fib with rate of 114 in the ED.  Potentially contributing to pulmonary edema. --Continue Eliquis --Change diltiazem to metoprolol succinate given mildly reduced EF and chronic edema --Telemetry --Monitor K, Mg  Acquired hypothyroidism -continue levothyroxine.  TSH normal 0.659.  OSA - CPAP ordered  Morbid obesity - Body mass index is 68.41 kg/m.  Complicates overall care and prognosis.    Lymphedema -appears stable.  Monitor.  Diuresis as above.    Recent lower extremity cellulitis -completed course of Bactrim.  DVT prophylaxis:  apixaban (ELIQUIS) tablet 5 mg   Diet:  Diet Orders (From admission, onward)    Start     Ordered   02/12/20 1539  Diet Heart Room service appropriate? Yes; Fluid consistency: Thin  Diet  effective now       Question Answer Comment  Room service appropriate? Yes   Fluid consistency: Thin      02/12/20 1543            Code Status: Full Code    Subjective 02/14/20    Pt seen at bedside. She reports feeling much better today, especially after getting bed bath, face and hair cleaned up, teeth brushed.  Breathing improved, less SOB today, doesn't feel wheezy.  We talked at length about diet at home and in general.  She asks for education about how to prevent future exacerbations like this.  We discussed the diagnoses being treated.   Disposition Plan & Communication   Status is: Inpatient  Inpatient status remains appropriate due to severity of illness and requirement for IV medications as above.  Patient continues to have high oxygen requirement on 10 L/min HFNC today.  Dispo: The patient is from: home              Anticipated d/c is to: home              Anticipated d/c date is: 3 days              Medically stable for d/c?: No    Family Communication: none at bedside.  Pt able to update.    Consults, Procedures, Significant Events   Consultants:   None  Procedures:   Echo 02/13/20  Antimicrobials:  Anti-infectives (From admission, onward)   Start     Dose/Rate Route Frequency Ordered Stop   02/12/20 1615  cefTRIAXone (ROCEPHIN) 2 g in sodium chloride 0.9 % 100 mL IVPB  Status:  Discontinued        2 g 200 mL/hr over 30 Minutes Intravenous Every 24 hours 02/12/20 1609 02/12/20 1610   02/12/20 1615  azithromycin (ZITHROMAX) tablet 500 mg  Status:  Discontinued        500 mg Oral Daily 02/12/20 1609 02/12/20 1610   02/12/20 1515  cefTRIAXone (ROCEPHIN) 2 g in sodium chloride 0.9 % 100 mL IVPB  Status:  Discontinued        2 g 200 mL/hr over 30 Minutes Intravenous Every 24 hours 02/12/20 1509 02/13/20 0907   02/12/20 1515  azithromycin (ZITHROMAX) 500 mg in sodium chloride 0.9 % 250 mL IVPB        500 mg 250 mL/hr over 60 Minutes Intravenous Every 24  hours 02/12/20 1509 02/16/20 2359        Objective   Vitals:   02/14/20 0618 02/14/20 0758 02/14/20 0801 02/14/20 1102  BP:  122/77  135/79  Pulse:  89  (!) 109  Resp:  18  18  Temp:  98.5 F (36.9 C)  98 F (36.7 C)  TempSrc:  Oral    SpO2:  94% 90% 91%  Weight: (!) 173.3 kg     Height:        Intake/Output Summary (Last 24 hours) at 02/14/2020 1550 Last data filed at 02/14/2020 1518 Gross per 24 hour  Intake 1560 ml  Output 4400 ml  Net -2840 ml   Filed Weights   02/13/20 0345 02/14/20 0618  Weight: (!) 173.4 kg (!) 173.3 kg    Physical Exam:  General exam: awake, alert, no acute distress, obese Respiratory system: CTAB, no wheezing or rhonchi, normal respiratory effort at rest, less short of breath when sitting up in bed today, on 10 L/min HFNC oxygen (down from 15). Cardiovascular system: normal S1/S2, RRR, unable to assess JVD, edema of bilateral LE's as below. Central nervous system: A&O x4. no gross focal neurologic deficits, normal speech Extremities: moves all, bilateral LE's edema mildly improved with skin wrinkling noted, but still ~2+ edema, stable erythema on distal lower extremities without differential warmth or tenderness Psychiatry: normal mood, congruent affect, judgement and insight appear normal  Labs   Data Reviewed: I have personally reviewed following labs and imaging studies  CBC: Recent Labs  Lab 02/12/20 1440 02/13/20 0508 02/14/20 0502  WBC 12.6* 9.0 9.9  NEUTROABS 11.8*  --   --   HGB 12.2 11.3* 11.0*  HCT 39.7 37.0 36.2  MCV 94.7 93.9 95.5  PLT 204 210 229   Basic Metabolic Panel: Recent Labs  Lab 02/12/20 1258 02/13/20 0508 02/14/20 0502  NA 137 136 137  K 4.7 5.2* 4.6  CL 95* 97* 94*  CO2 32 29 33*  GLUCOSE 130* 171* 178*  BUN 19 26* 37*  CREATININE 1.02* 1.10* 1.10*  CALCIUM 8.8* 8.8* 9.2  MG  --   --  2.6*   GFR: Estimated Creatinine Clearance: 86.4 mL/min (A) (by C-G formula based on SCr of 1.1 mg/dL  (H)). Liver Function Tests: Recent Labs  Lab 02/12/20 1258  AST 15  ALT 17  ALKPHOS 63  BILITOT 1.5*  PROT 7.3  ALBUMIN 3.5   No results for input(s): LIPASE, AMYLASE in the last 168 hours. No results for input(s): AMMONIA in the last 168 hours. Coagulation Profile: No results for input(s): INR, PROTIME in the last 168 hours. Cardiac Enzymes: No results for input(s): CKTOTAL, CKMB, CKMBINDEX, TROPONINI in the last 168 hours. BNP (last 3 results) No results for input(s): PROBNP in the last 8760 hours. HbA1C: No results for input(s): HGBA1C in the last 72 hours. CBG: No results for input(s): GLUCAP in the last 168 hours. Lipid Profile: No results for input(s): CHOL, HDL, LDLCALC, TRIG, CHOLHDL, LDLDIRECT in the last 72 hours. Thyroid Function Tests: Recent Labs    02/13/20 0508  TSH 0.659   Anemia Panel: No results for input(s): VITAMINB12, FOLATE, FERRITIN, TIBC, IRON, RETICCTPCT in the last 72 hours. Sepsis Labs: Recent Labs  Lab 02/12/20 1258 02/12/20 1533  PROCALCITON  --  0.10  LATICACIDVEN 1.5  --     Recent Results (from the past 240 hour(s))  Respiratory Panel by RT PCR (Flu A&B, Covid) - Nasopharyngeal Swab     Status: None   Collection Time: 02/12/20 12:58 PM   Specimen: Nasopharyngeal Swab  Result Value Ref Range Status   SARS Coronavirus 2 by RT PCR NEGATIVE NEGATIVE Final    Comment: (NOTE) SARS-CoV-2 target nucleic acids are NOT DETECTED.  The SARS-CoV-2 RNA is generally detectable in upper respiratoy specimens during the acute phase of infection. The lowest concentration of SARS-CoV-2 viral copies this assay can detect is 131 copies/mL. A negative result does not preclude SARS-Cov-2 infection and should not be used as the sole basis for treatment or other patient management decisions. A negative result may occur with  improper specimen collection/handling, submission of specimen other than nasopharyngeal swab, presence of viral mutation(s)  within the areas targeted by this assay, and inadequate number of viral copies (<131 copies/mL). A negative result must be combined with  clinical observations, patient history, and epidemiological information. The expected result is Negative.  Fact Sheet for Patients:  https://www.moore.com/  Fact Sheet for Healthcare Providers:  https://www.young.biz/  This test is no t yet approved or cleared by the [place] FDA and  has been authorized for detection and/or diagnosis of SARS-CoV-2 by FDA under an Emergency Use Authorization (EUA). This EUA will remain  in effect (meaning this test can be used) for the duration of the COVID-19 declaration under Section 564(b)(1) of the Act, 21 U.S.C. section 360bbb-3(b)(1), unless the authorization is terminated or revoked sooner.     Influenza A by PCR NEGATIVE NEGATIVE Final   Influenza B by PCR NEGATIVE NEGATIVE Final    Comment: (NOTE) The Xpert Xpress SARS-CoV-2/FLU/RSV assay is intended as an aid in  the diagnosis of influenza from Nasopharyngeal swab specimens and  should not be used as a sole basis for treatment. Nasal washings and  aspirates are unacceptable for Xpert Xpress SARS-CoV-2/FLU/RSV  testing.  Fact Sheet for Patients: https://www.moore.com/  Fact Sheet for Healthcare Providers: https://www.young.biz/  This test is not yet approved or cleared by the [place] FDA and  has been authorized for detection and/or diagnosis of SARS-CoV-2 by  FDA under an Emergency Use Authorization (EUA). This EUA will remain  in effect (meaning this test can be used) for the duration of the  Covid-19 declaration under Section 564(b)(1) of the Act, 21  U.S.C. section 360bbb-3(b)(1), unless the authorization is  terminated or revoked. Performed at [facility], [address] [place], [place] [address]   Culture, blood (single)     Status: None  (Preliminary result)   Collection Time: 02/12/20  3:32 PM   Specimen: BLOOD  Result Value Ref Range Status   Specimen Description BLOOD BLOOD LEFT FOREARM  Final   Special Requests   Final    BOTTLES DRAWN AEROBIC AND ANAEROBIC Blood Culture adequate volume   Culture   Final    NO GROWTH 2 DAYS Performed at [facility], [address], [place], [place] [address]    Report Status PENDING  Incomplete      Imaging Studies   US Venous Img Lower Bilateral (DVT)  Result Date: 02/13/2020 CLINICAL DATA:  Bilateral lower extremity pain and edema. History of cellulitis affecting the lower legs the past several months. Evaluate for DVT. EXAM: BILATERAL LOWER EXTREMITY VENOUS DOPPLER ULTRASOUND TECHNIQUE: Gray-scale sonography with graded compression, as well as color Doppler and duplex ultrasound were performed to evaluate the lower extremity deep venous systems from the level of the common femoral vein and including the common femoral, femoral, profunda femoral, popliteal and calf veins including the posterior tibial, peroneal and gastrocnemius veins when visible. The superficial great saphenous vein was also interrogated. Spectral Doppler was utilized to evaluate flow at rest and with distal augmentation maneuvers in the common femoral, femoral and popliteal veins. COMPARISON:  None. FINDINGS: Examination is degraded due to patient body habitus and poor sonographic window. RIGHT LOWER EXTREMITY Common Femoral Vein: No evidence of thrombus. Normal compressibility, respiratory phasicity and response to augmentation. Saphenofemoral Junction: No evidence of thrombus. Normal compressibility and flow on color Doppler imaging. Profunda Femoral Vein: No evidence of thrombus. Normal compressibility and flow on color Doppler imaging. Femoral Vein: No evidence of thrombus. Normal compressibility, respiratory phasicity and response to augmentation. Popliteal Vein: No evidence of thrombus. Normal  compressibility, respiratory phasicity and response to augmentation. Calf Veins: No evidence of thrombus. Normal compressibility and flow on color Doppler  imaging. Superficial Great Saphenous Vein: No evidence of thrombus. Normal compressibility. Venous Reflux:  None. Other Findings: There is a minimal amount of subcutaneous edema the level of the right calf (image 33). LEFT LOWER EXTREMITY Common Femoral Vein: No evidence of thrombus. Normal compressibility, respiratory phasicity and response to augmentation. Saphenofemoral Junction: No evidence of thrombus. Normal compressibility and flow on color Doppler imaging. Profunda Femoral Vein: No evidence of thrombus. Normal compressibility and flow on color Doppler imaging. Femoral Vein: No evidence of thrombus. Normal compressibility, respiratory phasicity and response to augmentation. Popliteal Vein: No evidence of thrombus. Normal compressibility, respiratory phasicity and response to augmentation. Calf Veins: No evidence of thrombus. Normal compressibility and flow on color Doppler imaging. Superficial Great Saphenous Vein: No evidence of thrombus. Normal compressibility. Venous Reflux:  None. Other Findings: There is a minimal amount of subcutaneous edema at the level of the left calf (image 64). IMPRESSION: No evidence of DVT within either lower extremity. Electronically Signed   By: Simonne ComeJohn  Watts M.D.   On: 02/13/2020 12:16   DG Chest Port 1 View  Result Date: 02/13/2020 CLINICAL DATA:  Acute on chronic respiratory failure EXAM: PORTABLE CHEST 1 VIEW COMPARISON:  Yesterday FINDINGS: Cardiomegaly with edema and diffuse vascular enlargement. There has been improvement since yesterday in the degree of pulmonary opacification. No visible effusion or pneumothorax. IMPRESSION: Improved CHF. Electronically Signed   By: Marnee SpringJonathon  Watts M.D.   On: 02/13/2020 07:34   ECHOCARDIOGRAM COMPLETE  Result Date: 02/13/2020    ECHOCARDIOGRAM REPORT   Patient Name:   Bertram DenverKAY H  Buckhalter Date of Exam: 02/13/2020 Medical Rec #:  [id number]    Height:       62.0 in Accession #:    [id number]   Weight:       382.3 lb Date of Birth:  [birth year]    BSA:          2.518 m Patient Age:    59 years     BP:           132/83 mmHg Patient Gender: F            HR:           115 bpm. Exam Location:  [facility] Procedure: 2D Echo, Color Doppler, Cardiac Doppler and Intracardiac            Opacification Agent Indications:     I48.91 Atrial fibrillation  History:         Patient has no prior history of Echocardiogram examinations.                  COPD; Risk Factors:Hypertension and Sleep Apnea.  Sonographer:     Humphrey RollsJoan Heiss RDCS (AE) Referring Phys:  [id number] Tresa EndoKELLY A Oden Lindaman Diagnosing Phys: Marcina MillardAlexander Paraschos MD  Sonographer Comments: Technically difficult study due to poor echo windows. Image acquisition challenging due to patient body habitus and Image acquisition challenging due to COPD. IMPRESSIONS  1. Left ventricular ejection fraction, by estimation, is 45 to 50%. The left ventricle has mildly decreased function. The left ventricle has no regional wall motion abnormalities. Left ventricular diastolic parameters were normal.  2. Right ventricular systolic function is normal. The right ventricular size is normal.  3. The mitral valve is normal in structure. Mild mitral valve regurgitation. No evidence of mitral stenosis.  4. The aortic valve is normal in structure. Aortic valve regurgitation is not visualized. No aortic stenosis is present.  5. The inferior vena cava is normal in size with  greater than 50% respiratory variability, suggesting right atrial pressure of 3 mmHg. FINDINGS  Left Ventricle: Left ventricular ejection fraction, by estimation, is 45 to 50%. The left ventricle has mildly decreased function. The left ventricle has no regional wall motion abnormalities. Definity contrast agent was given IV to delineate the left ventricular endocardial borders. The left ventricular internal cavity size was  normal in size. There is no left ventricular hypertrophy. Left ventricular diastolic parameters were normal. Right Ventricle: The right ventricular size is normal. No increase in right ventricular wall thickness. Right ventricular systolic function is normal. Left Atrium: Left atrial size was normal in size. Right Atrium: Right atrial size was normal in size. Pericardium: There is no evidence of pericardial effusion. Mitral Valve: The mitral valve is normal in structure. Mild mitral valve regurgitation. No evidence of mitral valve stenosis. MV peak gradient, 10.0 mmHg. The mean mitral valve gradient is 5.0 mmHg. Tricuspid Valve: The tricuspid valve is normal in structure. Tricuspid valve regurgitation is mild . No evidence of tricuspid stenosis. Aortic Valve: The aortic valve is normal in structure. Aortic valve regurgitation is not visualized. No aortic stenosis is present. Aortic valve mean gradient measures 4.0 mmHg. Aortic valve peak gradient measures 8.4 mmHg. Aortic valve area, by VTI measures 3.65 cm. Pulmonic Valve: The pulmonic valve was normal in structure. Pulmonic valve regurgitation is not visualized. No evidence of pulmonic stenosis. Aorta: The aortic root is normal in size and structure. Venous: The inferior vena cava is normal in size with greater than 50% respiratory variability, suggesting right atrial pressure of 3 mmHg. IAS/Shunts: No atrial level shunt detected by color flow Doppler.  LEFT VENTRICLE PLAX 2D LVIDd:         5.14 cm  Diastology LVIDs:         3.97 cm  LV e' medial:    9.79 cm/s LV PW:         1.25 cm  LV E/e' medial:  12.3 LV IVS:        1.25 cm  LV e' lateral:   11.00 cm/s LVOT diam:     2.40 cm  LV E/e' lateral: 11.0 LV SV:         81 LV SV Index:   32 LVOT Area:     4.52 cm  LEFT ATRIUM         Index LA diam:    3.80 cm 1.51 cm/m  AORTIC VALVE                   PULMONIC VALVE AV Area (Vmax):    3.68 cm    PV Vmax:       1.33 m/s AV Area (Vmean):   3.46 cm    PV Vmean:       86.100 cm/s AV Area (VTI):     3.65 cm    PV VTI:        0.301 m AV Vmax:           145.00 cm/s PV Peak grad:  7.1 mmHg AV Vmean:          91.200 cm/s PV Mean grad:  3.0 mmHg AV VTI:            0.223 m AV Peak Grad:      8.4 mmHg AV Mean Grad:      4.0 mmHg LVOT Vmax:         118.00 cm/s LVOT Vmean:        69.700 cm/s LVOT  VTI:          0.180 m LVOT/AV VTI ratio: 0.81  AORTA Ao Root diam: 3.10 cm MITRAL VALVE MV Area (PHT): 5.78 cm     SHUNTS MV Peak grad:  10.0 mmHg    Systemic VTI:  0.18 m MV Mean grad:  5.0 mmHg     Systemic Diam: 2.40 cm MV Vmax:       1.58 m/s MV Vmean:      106.0 cm/s MV Decel Time: 131 msec MV E velocity: 120.67 cm/s Marcina Millard MD Electronically signed by Marcina Millard MD Signature Date/Time: 02/13/2020/1:44:18 PM    Final      Medications   Scheduled Meds: . apixaban  5 mg Oral BID  . diltiazem  240 mg Oral QPC breakfast  . fluticasone furoate-vilanterol  1 puff Inhalation Daily   And  . umeclidinium bromide  1 puff Inhalation Daily  . furosemide  40 mg Intravenous BID  . guaiFENesin  600 mg Oral BID  . influenza vac split quadrivalent PF  0.5 mL Intramuscular Tomorrow-1000  . ipratropium-albuterol  3 mL Nebulization BID  . levothyroxine  75 mcg Oral Q0600  . methylPREDNISolone (SOLU-MEDROL) injection  40 mg Intravenous Q12H  . cyanocobalamin  2,000 mcg Oral Daily   Continuous Infusions: . azithromycin 500 mg (02/14/20 1452)       LOS: 2 days    Time spent: 35 minutes with > 50% spent in coordination of care and direct patient contact.    Pennie Banter, DO [facility]  02/14/2020, 3:50 PM    If 7PM-7AM, please contact night-coverage. How to contact the [facility] Attending or Consulting provider 7A - 7P or covering provider during after hours 7P -7A, for this patient?    1. Check the care team in [facility] and look for a) attending/consulting [facility] provider listed and b) the [facility] team listed 2. Log into [email or web address] and use [place]'s  universal password to access. If you do not have the password, please contact the hospital operator. 3. Locate the [facility] provider you are looking for under [facility] and page to a number that you can be directly reached. 4. If you still have difficulty reaching the provider, please page the [facility] (Director on Call) for the Hospitalists listed on amion for assistance.

## 2020-02-14 NOTE — Evaluation (Signed)
Occupational Therapy Evaluation Patient Details Name: Katherine Scott MRN: [id number] DOB: [birth year] Today's Date: 02/14/2020    History of Present Illness Azaylea Maves is a 59yoF who comes to [facility] on 11/4 c SOB adn increased supplemental O2 requirements. PMH inclusive of AF on eloquis, COPD, morbid obesity, lymphedema, OSA on CPAP, CRF on 2L continuous. Pt reports recent fevers, but in ED COVID and FLu tests are (-).   Clinical Impression   Ms Ekblad was seen for OT evaluation this date. Prior to hospital admission, pt was MOD I for mobility and ADLs using SPC as needed. Pt lives alone c family nearby. Pt presents to acute OT demonstrating impaired ADL performance and functional mobility 2/2 decreased LB access and functional balance/endurance deficits. Pt currently requires MAX A for LBD at bed level. SBA toothbrushing seated EOB - VCs for PLB and rest breaks. MIN A for t/fs - unable to porgress to standing grooming 2/2 desat. Pt would benefit from skilled OT to address noted impairments and functional limitations (see below for any additional details) in order to maximize safety and independence while minimizing falls risk and caregiver burden. Upon hospital discharge, recommend STR to maximize pt safety and return to PLOF.  Vitals Reclined: SpO2 89% on 10 L HFNC, HR 117 Sitting: desat 84% - resolved to 90% c seated rest and PLB Standing: SpO2 83% on 10L HFNC, HR 151 - unable to resolve in standing, resolved to 90% after ~21mins sitting     Follow Up Recommendations  SNF    Equipment Recommendations  Other (comment) (TBD)    Recommendations for Other Services       Precautions / Restrictions Precautions Precautions: Fall Restrictions Weight Bearing Restrictions: No      Mobility Bed Mobility Overal bed mobility: Needs Assistance Bed Mobility: Supine to Sit;Sit to Supine     Supine to sit: Min assist;HOB elevated Sit to supine: Min guard   General bed mobility comments: assist  for torso elevation    Transfers Overall transfer level: Needs assistance Equipment used: 1 person hand held assist Transfers: Sit to/from Stand Sit to Stand: Min assist    General transfer comment: L rail + HHA sit<>stand and 2 side steps at EOB - desat and returned to sitting    Balance Overall balance assessment: Needs assistance Sitting-balance support: Single extremity supported;Feet supported Sitting balance-Leahy Scale: Good     Standing balance support: Bilateral upper extremity supported Standing balance-Leahy Scale: Fair            ADL either performed or assessed with clinical judgement   ADL Overall ADL's : Needs assistance/impaired       General ADL Comments: MAX A for LBD at bed level. SBA toothbrushing seated EOB - VCs for PLB and rest breaks. MIN A for t/fs - unable to porgress to standing grooming 2/2 desat                  Pertinent Vitals/Pain Pain Assessment: No/denies pain     Hand Dominance Right   Extremity/Trunk Assessment Upper Extremity Assessment Upper Extremity Assessment: Overall WFL for tasks assessed   Lower Extremity Assessment Lower Extremity Assessment: Generalized weakness       Communication Communication Communication: No difficulties   Cognition Arousal/Alertness: Awake/alert Behavior During Therapy: WFL for tasks assessed/performed Overall Cognitive Status: Within Functional Limits for tasks assessed          General Comments  Reclined: SpO2 89% on 10 L HFNC, HR 117 Sitting: desat 84% -  resolved to 90% c seated rest and PLB Standing: SpO2 83% on 10L HFNC, HR 151 - unable to resolve in standing, resolved to 90% after ~68mins sitting    Exercises Exercises: Other exercises;General Lower Extremity General Exercises - Lower Extremity Long Arc Quad: AROM;Strengthening;Both;10 reps;Seated Hip Flexion/Marching: AROM;Strengthening;Both;10 reps;Seated Other Exercises Other Exercises: Pt educated re: OT role, DME  recs, d/c recs, falls prevention, ECS, HEP (bed level exercises) Other Exercises: LBD, tooth brushing, sup<>sit, sit<>stand, sitting/standing balance/tolerance, seated therex   Shoulder Instructions      Home Living Family/patient expects to be discharged to:: Private residence Living Arrangements: Alone Available Help at Discharge: Family;Available PRN/intermittently (2 sons in the county, sister lives on the block) Type of Home: House Home Access: Stairs to enter Secretary/administrator of Steps: 4 Entrance Stairs-Rails: Left Home Layout: Multi-level     Bathroom Shower/Tub: Tub/shower unit         Home Equipment: Gilmer Mor - single point (cane for community, no AD in home)   Additional Comments: limited community mobility, endorses furniture walking in home       Prior Functioning/Environment Level of Independence: Independent with assistive device(s)        Comments: drives PRN, meals delivered        OT Problem List: Decreased range of motion;Decreased activity tolerance;Impaired balance (sitting and/or standing);Cardiopulmonary status limiting activity      OT Treatment/Interventions: Self-care/ADL training;Therapeutic exercise;Energy conservation;DME and/or AE instruction;Therapeutic activities;Patient/family education;Balance training    OT Goals(Current goals can be found in the care plan section) Acute Rehab OT Goals Patient Stated Goal: regain independent with mobility OT Goal Formulation: With patient Time For Goal Achievement: 02/28/20 Potential to Achieve Goals: Good ADL Goals Pt Will Perform Grooming: with modified independence;standing (c LRAD PRN and no cues for seated rest breaks) Pt Will Perform Lower Body Dressing: with min guard assist;sit to/from stand (c LRAD PRN) Pt Will Transfer to Toilet: with supervision;ambulating;regular height toilet (c LRAD PRN)  OT Frequency: Min 2X/week   Barriers to D/C: Inaccessible home environment              AM-PAC OT "6 Clicks" Daily Activity     Outcome Measure Help from another person eating meals?: None Help from another person taking care of personal grooming?: A Little Help from another person toileting, which includes using toliet, bedpan, or urinal?: A Lot Help from another person bathing (including washing, rinsing, drying)?: A Lot Help from another person to put on and taking off regular upper body clothing?: A Little Help from another person to put on and taking off regular lower body clothing?: A Lot 6 Click Score: 16   End of Session Equipment Utilized During Treatment: Oxygen (10L HFNC)  Activity Tolerance: Patient tolerated treatment well Patient left: in bed;with call bell/phone within reach;Other (comment) (pt requests 4 rails up )  OT Visit Diagnosis: Other abnormalities of gait and mobility (R26.89);Muscle weakness (generalized) (M62.81)                Time: 4967-5916 OT Time Calculation (min): 36 min Charges:  OT General Charges $OT Visit: 1 Visit OT Evaluation $OT Eval Moderate Complexity: 1 Mod OT Treatments $Self Care/Home Management : 8-22 mins $Therapeutic Activity: 8-22 mins  [name], M.S. OTR/L  02/14/20, 12:40 PM  ascom [phone]

## 2020-02-15 LAB — BASIC METABOLIC PANEL
Anion gap: 10 (ref 5–15)
BUN: 33 mg/dL — ABNORMAL HIGH (ref 6–20)
CO2: 35 mmol/L — ABNORMAL HIGH (ref 22–32)
Calcium: 8.6 mg/dL — ABNORMAL LOW (ref 8.9–10.3)
Chloride: 94 mmol/L — ABNORMAL LOW (ref 98–111)
Creatinine, Ser: 0.87 mg/dL (ref 0.44–1.00)
GFR, Estimated: >60 mL/min (ref >60)
Glucose, Bld: 194 mg/dL — ABNORMAL HIGH (ref 70–99)
Potassium: 4.5 mmol/L (ref 3.5–5.1)
Sodium: 139 mmol/L (ref 135–145)

## 2020-02-15 MED ORDER — SODIUM CHLORIDE 0.9% FLUSH
10.0000 mL | Freq: Two times a day (BID) | INTRAVENOUS | Status: DC
  Administered 2020-02-15 – 2020-02-19 (×9): 10 mL via INTRAVENOUS

## 2020-02-15 MED ORDER — METOPROLOL SUCCINATE ER 50 MG PO TB24
75.0000 mg | ORAL_TABLET | Freq: Every day | ORAL | Status: DC
  Administered 2020-02-16 – 2020-02-20 (×5): 75 mg via ORAL
  Filled 2020-02-15 (×5): qty 1

## 2020-02-15 MED ORDER — IPRATROPIUM-ALBUTEROL 0.5-2.5 (3) MG/3ML IN SOLN: 3.0000 mL | Freq: Four times a day (QID) | RESPIRATORY_TRACT | Status: DC | PRN

## 2020-02-15 MED ORDER — HYDRALAZINE HCL 25 MG PO TABS: 25.0000 mg | ORAL_TABLET | Freq: Three times a day (TID) | ORAL | Status: DC | PRN

## 2020-02-15 NOTE — Progress Notes (Addendum)
PROGRESS NOTE    Katherine Scott   WLN:[id number]  DOB: 10-18-1960  PCP: Smitty Cords, DO    DOA: 02/12/2020 LOS: 3   Brief Narrative   Katherine Scott is a 59 y.o. female with medical history significant of persistent atrial fibrillation on Eliquis, COPD, morbid obesity, lymphedema, OSA on CPAP at night, chronic hypoxic respiratory failure on 2 L/min oxygen who presented to the ED from home via EMS on 02/12/20 due to worsening shortness of breath and increased oxygen requirements, productive cough.  She was in respiratory distress on arrival and placed on BiPAP in the ED.  Admitted for multifactorial acute on chronic respiratory failure with hypoxia secondary to pulmonary edema, exacerbation of COPD and possible pneumonia.     Assessment & Plan   Principal Problem:   Acute on chronic respiratory failure with hypoxia (HCC) Active Problems:   COPD with acute exacerbation (HCC)   Pulmonary edema   Persistent atrial fibrillation (HCC)   Acquired hypothyroidism   BMI 60.0-69.9, adult (HCC)   Lymphedema   Shortness of breath   Acute on chronic respiratory failure with hypoxia -at baseline uses 1 L/min, presented with dyspnea and requiring BiPAP, requiring 4 L/min at home continuously for the past couple days.  Chest x-ray with diffuse bilateral opacities which could reflect pulmonary edema or pneumonia.  Procal negative.  D-dimer elevated but can be explained by OSA / OHS and ?pulmonary hypertension.  Degree of hypoxia consistent with her chest xray, so low suspicion for PE.  Bilateral LE dopplers negative for DVT's bilaterally.  Pulmonary Edema - no prior CHF diagnosis, suspect pulmonary hypertension contributing.  BNP was mildly elevated. Echo 11/5 - EF 45-50%, normal diastolic function, mild MR.   Suspect possible pulmonary hypertension contributing given underlying OSA (prior CT chest showed enlarged pulmonary a.).  COPD with acute exacerbation - ruled out.  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   Patient does not have diagnosis COPD after further chart review including PFT's and recent pulmonology notes.  She did present with diffuse expiratory wheezing and diminished aeration which has improved with IV steroids.  Has been on steroids outpatient, tapering off recently, using intermittently when more SOB.  Not clear amiodarone toxicity still a factor.  Pulmonology notes & prior imaging briefly reviewed.    Shortness of breath - POA, due to above.  11/5 - repeat CXR with improved opacities after diuresis 11/6 - improving edema and O2 requirement down to 10 from 15 L/min HFNC, no wheezing 11/7 - edema further improved, on 6 L/min oxygen, no wheezing, put out 3.8 L yesterday PLAN: --Consult [facility] cardiology (pt sees Dr. Lady Gary) --Close pulmonology follow up outpatient --Mildly reduced EF, consider transition of CCB --Continue Zithromax x 5 day course --transitioned to prednisone today (11/7), day 4 of steroids, will stop and monitor clinically --Scheduled DuoNebs every 6 hours while awake --As needed albuterol nebs --Continue formulary equivalents for Trelegy (Breo & Incruse ellipta) --Schedule Mucinex BID --Tylenol as needed fevers   Persistent atrial fibrillation with RVR -presented with rapid A. fib initial heart rate 131, EKG confirming A. fib with rate of 114 in the ED.  Potentially contributing to pulmonary edema. 11/6 Change diltiazem to metoprolol succinate given mildly reduced EF and chronic edema (monitor COPD on beta blocker) 11/7 - still intermittently tachycardic, mostly with exertion --Continue Toprol XL --Continue Eliquis --Telemetry --Monitor K, Mg  Acquired hypothyroidism -continue levothyroxine.  TSH normal 0.659.  OSA - CPAP ordered  Morbid obesity - Body mass index is 68.41 kg/m.  Complicates overall care and prognosis.    Lymphedema -appears stable.  Monitor.  Diuresis as above.    Recent lower  extremity cellulitis -completed course of Bactrim.      DVT prophylaxis:  apixaban (ELIQUIS) tablet 5 mg   Diet:  Diet Orders (From admission, onward)    Start     Ordered   02/12/20 1539  Diet Heart Room service appropriate? Yes; Fluid consistency: Thin  Diet effective now       Question Answer Comment  Room service appropriate? Yes   Fluid consistency: Thin      02/12/20 1543            Code Status: Full Code    Subjective 02/15/20    Pt seen at bedside. Reports feeling well today.  Less short of breath on exertion.  Doing bed mobility exercises.  Says she would be agreeable with rehab, but hopeful for improved functional status once diuresed further.  No fever/chills, N/V, CP or other acute complaints.    Disposition Plan & Communication   Status is: Inpatient  Inpatient status remains appropriate due to severity of illness and requirement for IV medications as above.  Ongoing IV diuresis. Patient continues to have high oxygen requirement.   Dispo: The patient is from: home              Anticipated d/c is to: home              Anticipated d/c date is: 2-3 days              Medically stable for d/c?: No    Family Communication: none at bedside.  Pt able to update.    Consults, Procedures, Significant Events   Consultants:   None  Procedures:   Echo 02/13/20  Antimicrobials:  Anti-infectives (From admission, onward)   Start     Dose/Rate Route Frequency Ordered Stop   02/12/20 1615  cefTRIAXone (ROCEPHIN) 2 g in sodium chloride 0.9 % 100 mL IVPB  Status:  Discontinued        2 g 200 mL/hr over 30 Minutes Intravenous Every 24 hours 02/12/20 1609 02/12/20 1610   02/12/20 1615  azithromycin (ZITHROMAX) tablet 500 mg  Status:  Discontinued        500 mg Oral Daily 02/12/20 1609 02/12/20 1610   02/12/20 1515  cefTRIAXone (ROCEPHIN) 2 g in sodium chloride 0.9 % 100 mL IVPB  Status:  Discontinued        2 g 200 mL/hr over 30 Minutes Intravenous Every 24 hours  02/12/20 1509 02/13/20 0907   02/12/20 1515  azithromycin (ZITHROMAX) 500 mg in sodium chloride 0.9 % 250 mL IVPB        500 mg 250 mL/hr over 60 Minutes Intravenous Every 24 hours 02/12/20 1509 02/16/20 2359        Objective   Vitals:   02/15/20 0744 02/15/20 0750 02/15/20 1147 02/15/20 1207  BP:  (!) 160/98 (!) 157/87   Pulse:  (!) 103 (!) 118 100  Resp:  18 16   Temp:  98.5 F (36.9 C) 98.8 F (37.1 C)   TempSrc:  Oral Oral   SpO2: 90% 97% 90%   Weight:      Height:        Intake/Output Summary (Last 24 hours) at 02/15/2020 1418 Last data filed at 02/15/2020 1023 Gross per 24 hour  Intake 840 ml  Output 3500 ml  Net -2660 ml   Filed Weights   02/13/20 0345 02/14/20 0618  Weight: (!) 173.4 kg (!) 173.3 kg    Physical Exam:  General exam: awake, alert, no acute distress, obese Respiratory system: CTAB, no wheezing or rhonchi, on less oxygen 6 L/min oxygen Cardiovascular system: normal S1/S2, RRR, edema of bilateral LE's as below. Central nervous system: A&O x4. no gross focal neurologic deficits, normal speech Extremities: moves all, bilateral LE's edema further improved with increased skin wrinkling, erythema seems improved as well, no differential warmth or tenderness of lower extremities   Labs   Data Reviewed: I have personally reviewed following labs and imaging studies  CBC: Recent Labs  Lab 02/12/20 1440 02/13/20 0508 02/14/20 0502  WBC 12.6* 9.0 9.9  NEUTROABS 11.8*  --   --   HGB 12.2 11.3* 11.0*  HCT 39.7 37.0 36.2  MCV 94.7 93.9 95.5  PLT 204 210 229   Basic Metabolic Panel: Recent Labs  Lab 02/12/20 1258 02/13/20 0508 02/14/20 0502 02/15/20 0510  NA 137 136 137 139  K 4.7 5.2* 4.6 4.5  CL 95* 97* 94* 94*  CO2 32 29 33* 35*  GLUCOSE 130* 171* 178* 194*  BUN 19 26* 37* 33*  CREATININE 1.02* 1.10* 1.10* 0.87  CALCIUM 8.8* 8.8* 9.2 8.6*  MG  --   --  2.6*  --    GFR: Estimated Creatinine Clearance: 109.3 mL/min (by C-G formula based  on SCr of 0.87 mg/dL). Liver Function Tests: Recent Labs  Lab 02/12/20 1258  AST 15  ALT 17  ALKPHOS 63  BILITOT 1.5*  PROT 7.3  ALBUMIN 3.5   No results for input(s): LIPASE, AMYLASE in the last 168 hours. No results for input(s): AMMONIA in the last 168 hours. Coagulation Profile: No results for input(s): INR, PROTIME in the last 168 hours. Cardiac Enzymes: No results for input(s): CKTOTAL, CKMB, CKMBINDEX, TROPONINI in the last 168 hours. BNP (last 3 results) No results for input(s): PROBNP in the last 8760 hours. HbA1C: No results for input(s): HGBA1C in the last 72 hours. CBG: No results for input(s): GLUCAP in the last 168 hours. Lipid Profile: No results for input(s): CHOL, HDL, LDLCALC, TRIG, CHOLHDL, LDLDIRECT in the last 72 hours. Thyroid Function Tests: Recent Labs    02/13/20 0508  TSH 0.659   Anemia Panel: No results for input(s): VITAMINB12, FOLATE, FERRITIN, TIBC, IRON, RETICCTPCT in the last 72 hours. Sepsis Labs: Recent Labs  Lab 02/12/20 1258 02/12/20 1533  PROCALCITON  --  0.10  LATICACIDVEN 1.5  --     Recent Results (from the past 240 hour(s))  Respiratory Panel by RT PCR (Flu A&B, Covid) - Nasopharyngeal Swab     Status: None   Collection Time: 02/12/20 12:58 PM   Specimen: Nasopharyngeal Swab  Result Value Ref Range Status   SARS Coronavirus 2 by RT PCR NEGATIVE NEGATIVE Final    Comment: (NOTE) SARS-CoV-2 target nucleic acids are NOT DETECTED.  The SARS-CoV-2 RNA is generally detectable in upper respiratoy specimens during the acute phase of infection. The lowest concentration of SARS-CoV-2 viral copies this assay can detect is 131 copies/mL. A negative result does not preclude SARS-Cov-2 infection and should not be used as the sole basis for treatment or other patient management decisions. A negative result may occur with  improper specimen collection/handling, submission of specimen other than nasopharyngeal swab, presence of viral  mutation(s) within the areas targeted by this assay, and inadequate number of viral copies (<131 copies/mL). A  negative result must be combined with clinical observations, patient history, and epidemiological information. The expected result is Negative.  Fact Sheet for Patients:  https://www.moore.com/  Fact Sheet for Healthcare Providers:  https://www.young.biz/  This test is no t yet approved or cleared by the [place] FDA and  has been authorized for detection and/or diagnosis of SARS-CoV-2 by FDA under an Emergency Use Authorization (EUA). This EUA will remain  in effect (meaning this test can be used) for the duration of the COVID-19 declaration under Section 564(b)(1) of the Act, 21 U.S.C. section 360bbb-3(b)(1), unless the authorization is terminated or revoked sooner.     Influenza A by PCR NEGATIVE NEGATIVE Final   Influenza B by PCR NEGATIVE NEGATIVE Final    Comment: (NOTE) The Xpert Xpress SARS-CoV-2/FLU/RSV assay is intended as an aid in  the diagnosis of influenza from Nasopharyngeal swab specimens and  should not be used as a sole basis for treatment. Nasal washings and  aspirates are unacceptable for Xpert Xpress SARS-CoV-2/FLU/RSV  testing.  Fact Sheet for Patients: https://www.moore.com/  Fact Sheet for Healthcare Providers: https://www.young.biz/  This test is not yet approved or cleared by the [place] FDA and  has been authorized for detection and/or diagnosis of SARS-CoV-2 by  FDA under an Emergency Use Authorization (EUA). This EUA will remain  in effect (meaning this test can be used) for the duration of the  Covid-19 declaration under Section 564(b)(1) of the Act, 21  U.S.C. section 360bbb-3(b)(1), unless the authorization is  terminated or revoked. Performed at [facility], [address] [place], [place] [address]   Culture, blood (single)      Status: None (Preliminary result)   Collection Time: 02/12/20  3:32 PM   Specimen: BLOOD  Result Value Ref Range Status   Specimen Description BLOOD BLOOD LEFT FOREARM  Final   Special Requests   Final    BOTTLES DRAWN AEROBIC AND ANAEROBIC Blood Culture adequate volume   Culture   Final    NO GROWTH 2 DAYS Performed at [facility], [address], [place], [place] [address]    Report Status PENDING  Incomplete      Imaging Studies   No results found.   Medications   Scheduled Meds: . apixaban  5 mg Oral BID  . fluticasone furoate-vilanterol  1 puff Inhalation Daily   And  . umeclidinium bromide  1 puff Inhalation Daily  . furosemide  40 mg Intravenous BID  . guaiFENesin  600 mg Oral BID  . influenza vac split quadrivalent PF  0.5 mL Intramuscular Tomorrow-1000  . ipratropium-albuterol  3 mL Nebulization BID  . levothyroxine  75 mcg Oral Q0600  . metoprolol succinate  50 mg Oral Daily  . predniSONE  40 mg Oral Q breakfast  . cyanocobalamin  2,000 mcg Oral Daily   Continuous Infusions: . azithromycin 500 mg (02/14/20 1452)       LOS: 3 days    Time spent: 25 minutes with > 50% spent in coordination of care and direct patient contact.    Pennie Banter, DO [facility]  02/15/2020, 2:18 PM    If 7PM-7AM, please contact night-coverage. How to contact the [facility] Attending or Consulting provider 7A - 7P or covering provider during after hours 7P -7A, for this patient?    1. Check the care team in [facility] and look for a) attending/consulting [facility] provider listed and b) the [facility] team listed 2. Log into [email or web address] and use [place] universal  password to access. If you do not have the password, please contact the hospital operator. 3. Locate the [facility] provider you are looking for under [facility] and page to a number that you can be directly reached. 4. If you still have difficulty reaching the provider, please page the [facility] (Director on Call)  for the Hospitalists listed on amion for assistance.

## 2020-02-15 NOTE — Consult Note (Signed)
[facility]  CARDIOLOGY CONSULT NOTE  Patient ID: FINOLA ROSAL MRN: [id number] DOB/AGE: [birth year] 59 y.o.  Admit date: 02/12/2020 Referring Physician Denton Lank Primary Physician [facility] Primary Cardiologist Fath Reason for Consultation congestive heart failure and atrial fibrillation  HPI: 59 year old female referred for evaluation of caute on chronic diastolic congestive heart failure and persistent fibrillation with rapid ventricular rate.  The patient has a history of COPD and obstructive sleep apnea on CPAP with chronic hypoxemic respiratory failure.  She presents with 2 to 3-day history of worsening as of breath, increasing oxygen requirement, wheezing, mild nonproductive cough, and chronic  lymphedema a with history of lower extremity cellulitis.  The emergency room, patient initially treated with BiPAP, currently on oxygen by nasal cannula.  CT revealed atrial fibrillation at a rate of 113 bpm.  Chest x-ray revealed mild pulmonary edema.  She was treated with intravenous furosemide, IV Rocephin, and DuoNeb breathing treatments.  High-sensitivity troponin were normal.  BNP was 140.9.  Overall, patient reports feeling better with less shortness of breath.  The patient has a history of persistent atrial fibrillation, on Cardizem CD which was changed to metoprolol succinate during this hospitalization.  Patient has a history of amiodarone toxicity.  She has been considered for catheter ablation which has been deferred due to obesity.  Telemetry reveals persistent atrial fibrillation at a rate of 113 bpm.  Patient denies palpitations or tachycardia.  Patient has signs and symptoms of chronic diastolic congestive heart failure.  2D echocardiogram 02/13/2020 revealed LVEF 45 to 50%.  The patient is morbidly obese with chronic lymphedema, with good diuresis on furosemide 40 mg IV twice daily.  Review of systems complete and found to be negative unless listed above     Past Medical History:   Diagnosis Date  . Abnormal mammogram, unspecified 2013  . Atrial fibrillation (HCC)    2013  . Breast screening, unspecified   . COPD (chronic obstructive pulmonary disease) (HCC)   . History of continuous positive airway pressure (CPAP) therapy at home   . Hypothyroidism   . Obesity, unspecified   . Pneumonia   . Screening for obesity   . Sleep apnea   . Special screening for malignant neoplasms, colon   . Unspecified essential hypertension     Past Surgical History:  Procedure Laterality Date  . ABDOMINAL HYSTERECTOMY  2011  . ANTERIOR CRUCIATE LIGAMENT REPAIR  2003  . BREAST BIOPSY Left January 22, 2012   left breast stereotactic biopsy showed fibroadenomatous changes with microcalcifications, fat necrosis, and sclerosing adenosis and columnar cell changes. No evidence of atypia or malignancy.  Marland Kitchen CARDIOVERSION N/A 09/22/2016   Procedure: CARDIOVERSION;  Surgeon: Dalia Heading, MD;  Location: [facility] ORS;  Service: Cardiovascular;  Laterality: N/A;  . CARDIOVERSION N/A 01/17/2018   Procedure: CARDIOVERSION;  Surgeon: Dalia Heading, MD;  Location: [facility] ORS;  Service: Cardiovascular;  Laterality: N/A;  . CARDIOVERSION N/A 01/09/2019   Procedure: CARDIOVERSION;  Surgeon: Dalia Heading, MD;  Location: [facility] ORS;  Service: Cardiovascular;  Laterality: N/A;  . CESAREAN SECTION  1985, 1987, and 1998  . COLONOSCOPY  2011   Dr. Mechele Collin  . ELECTROPHYSIOLOGIC STUDY N/A 12/03/2014   Procedure: Cardioversion;  Surgeon: Dalia Heading, MD;  Location: [facility] ORS;  Service: Cardiovascular;  Laterality: N/A;  . ELECTROPHYSIOLOGIC STUDY N/A 04/01/2015   Procedure: Cardioversion;  Surgeon: Dalia Heading, MD;  Location: [facility] ORS;  Service: Cardiovascular;  Laterality: N/A;  . KNEE SURGERY  2013    Medications  Prior to Admission  Medication Sig Dispense Refill Last Dose  . apixaban (ELIQUIS) 5 MG TABS tablet Take 5 mg by mouth 2 (two) times daily.    02/11/2020 at 2200  . diltiazem (CARDIZEM CD)  240 MG 24 hr capsule Take 240 mg by mouth daily after breakfast.   02/11/2020 at 0900  . Fluticasone-Umeclidin-Vilant (TRELEGY ELLIPTA) 100-62.5-25 MCG/INH AEPB Inhale 1 Inhaler into the lungs daily. 180 each 4 02/11/2020 at 0900  . furosemide (LASIX) 40 MG tablet Take 40 mg by mouth daily.  5 02/11/2020 at 0900  . levothyroxine (SYNTHROID) 75 MCG tablet TAKE 1 TABLET BY MOUTH DAILY BEFORE BREAKFAST. WILL NEED THYROID LAB & FOLLOW-UP FOR FURTHER REFILLS (Patient taking differently: Take 75 mcg by mouth daily before breakfast. ) 90 tablet 0 02/11/2020 at 0400  . OXYGEN Inhale 2 L/L into the lungs as needed.    02/12/2020 at Unknown time  . potassium chloride SA (K-DUR,KLOR-CON) 20 MEQ tablet Take 1 tablet (20 mEq total) by mouth daily. (Patient taking differently: Take 20 mEq by mouth at bedtime. ) 30 tablet 6 02/11/2020 at 0900  . predniSONE (DELTASONE) 10 MG tablet TAKE 1 TABLET (10 MG TOTAL) BY MOUTH DAILY WITH BREAKFAST. 30 tablet 5 02/11/2020 at 0900  . sulfamethoxazole-trimethoprim (BACTRIM DS) 800-160 MG tablet Take 1 tablet by mouth 2 (two) times daily. 14 tablet 0 Past Week at Unknown time  . albuterol (VENTOLIN HFA) 108 (90 Base) MCG/ACT inhaler Inhale 2 puffs into the lungs every 6 (six) hours as needed for wheezing. 8 g 5 unknown at prn  . amLODipine (NORVASC) 5 MG tablet Take 1 tablet (5 mg total) by mouth daily. (Patient not taking: Reported on 02/12/2020) 14 tablet 0 Not Taking at Unknown time  . cyanocobalamin 2000 MCG tablet Take 2,000 mcg by mouth daily.  (Patient not taking: Reported on 02/12/2020)   Not Taking at Unknown time  . diltiazem (TIAZAC) 240 MG 24 hr capsule TAKE 1 CAPSULE (240 MG TOTAL) BY MOUTH ONCE DAILY.     Marland Kitchen Spacer/Aero-Holding Chambers DEVI 1 Device by Does not apply route as needed. 1 each 0   . traMADol (ULTRAM) 50 MG tablet Take 50 mg by mouth 3 (three) times daily as needed. (Patient not taking: Reported on 02/12/2020)   Not Taking at Unknown time   Social History    Socioeconomic History  . Marital status: Divorced    Spouse name: Not on file  . Number of children: Not on file  . Years of education: Not on file  . Highest education level: Not on file  Occupational History  . Not on file  Tobacco Use  . Smoking status: Passive Smoke Exposure - Never Smoker  . Smokeless tobacco: Never Used  Vaping Use  . Vaping Use: Never used  Substance and Sexual Activity  . Alcohol use: No  . Drug use: No  . Sexual activity: Not on file  Other Topics Concern  . Not on file  Social History Narrative  . Not on file   Social Determinants of Health   Financial Resource Strain:   . Difficulty of Paying Living Expenses: Not on file  Food Insecurity:   . Worried About Programme researcher, broadcasting/film/video in the Last Year: Not on file  . Ran Out of Food in the Last Year: Not on file  Transportation Needs:   . Lack of Transportation (Medical): Not on file  . Lack of Transportation (Non-Medical): Not on file  Physical Activity:   .  Days of Exercise per Week: Not on file  . Minutes of Exercise per Session: Not on file  Stress:   . Feeling of Stress : Not on file  Social Connections:   . Frequency of Communication with Friends and Family: Not on file  . Frequency of Social Gatherings with Friends and Family: Not on file  . Attends Religious Services: Not on file  . Active Member of Clubs or Organizations: Not on file  . Attends BankerClub or Organization Meetings: Not on file  . Marital Status: Not on file  Intimate Partner Violence:   . Fear of Current or Ex-Partner: Not on file  . Emotionally Abused: Not on file  . Physically Abused: Not on file  . Sexually Abused: Not on file    Family History  Problem Relation Age of Onset  . Colon cancer Mother        diagnosis age 59's  . Colon cancer Sister        diagnosis age 645  . Atrial fibrillation Sister   . Breast cancer Other        Maternal Grandmother; diagnosis age 59's  . Lung cancer Other        Paternal  Grandmother; diagnosis age 59's  . Colon cancer Other        Paternal Grandfather; diagnosis age 59's  . Heart disease Father       Review of systems complete and found to be negative unless listed above      PHYSICAL EXAM  General: Well developed, well nourished, in no acute distress HEENT:  Normocephalic and atramatic Neck:  No JVD.  Lungs: Clear bilaterally to auscultation and percussion. Heart: HRRR . Normal S1 and S2 without gallops or murmurs.  Abdomen: Bowel sounds are positive, abdomen soft and non-tender  Msk:  Back normal, normal gait. Normal strength and tone for age. Extremities: No clubbing, cyanosis or edema.   Neuro: Alert and oriented X 3. Psych:  Good affect, responds appropriately  Labs:   Lab Results  Component Value Date   WBC 9.9 02/14/2020   HGB 11.0 (L) 02/14/2020   HCT 36.2 02/14/2020   MCV 95.5 02/14/2020   PLT 229 02/14/2020    Recent Labs  Lab 02/12/20 1258 02/13/20 0508 02/15/20 0510  NA 137   < > 139  K 4.7   < > 4.5  CL 95*   < > 94*  CO2 32   < > 35*  BUN 19   < > 33*  CREATININE 1.02*   < > 0.87  CALCIUM 8.8*   < > 8.6*  PROT 7.3  --   --   BILITOT 1.5*  --   --   ALKPHOS 63  --   --   ALT 17  --   --   AST 15  --   --   GLUCOSE 130*   < > 194*   < > = values in this interval not displayed.   No results found for: CKTOTAL, CKMB, CKMBINDEX, TROPONINI  Lab Results  Component Value Date   CHOL 190 11/15/2016   Lab Results  Component Value Date   HDL 61 11/15/2016   Lab Results  Component Value Date   LDLCALC 106 (H) 11/15/2016   Lab Results  Component Value Date   TRIG 114 11/15/2016   Lab Results  Component Value Date   CHOLHDL 3.1 11/15/2016   No results found for: LDLDIRECT    Radiology: US Venous Img Lower  Bilateral (DVT)  Result Date: 02/13/2020 CLINICAL DATA:  Bilateral lower extremity pain and edema. History of cellulitis affecting the lower legs the past several months. Evaluate for DVT. EXAM:  BILATERAL LOWER EXTREMITY VENOUS DOPPLER ULTRASOUND TECHNIQUE: Gray-scale sonography with graded compression, as well as color Doppler and duplex ultrasound were performed to evaluate the lower extremity deep venous systems from the level of the common femoral vein and including the common femoral, femoral, profunda femoral, popliteal and calf veins including the posterior tibial, peroneal and gastrocnemius veins when visible. The superficial great saphenous vein was also interrogated. Spectral Doppler was utilized to evaluate flow at rest and with distal augmentation maneuvers in the common femoral, femoral and popliteal veins. COMPARISON:  None. FINDINGS: Examination is degraded due to patient body habitus and poor sonographic window. RIGHT LOWER EXTREMITY Common Femoral Vein: No evidence of thrombus. Normal compressibility, respiratory phasicity and response to augmentation. Saphenofemoral Junction: No evidence of thrombus. Normal compressibility and flow on color Doppler imaging. Profunda Femoral Vein: No evidence of thrombus. Normal compressibility and flow on color Doppler imaging. Femoral Vein: No evidence of thrombus. Normal compressibility, respiratory phasicity and response to augmentation. Popliteal Vein: No evidence of thrombus. Normal compressibility, respiratory phasicity and response to augmentation. Calf Veins: No evidence of thrombus. Normal compressibility and flow on color Doppler imaging. Superficial Great Saphenous Vein: No evidence of thrombus. Normal compressibility. Venous Reflux:  None. Other Findings: There is a minimal amount of subcutaneous edema the level of the right calf (image 33). LEFT LOWER EXTREMITY Common Femoral Vein: No evidence of thrombus. Normal compressibility, respiratory phasicity and response to augmentation. Saphenofemoral Junction: No evidence of thrombus. Normal compressibility and flow on color Doppler imaging. Profunda Femoral Vein: No evidence of thrombus. Normal  compressibility and flow on color Doppler imaging. Femoral Vein: No evidence of thrombus. Normal compressibility, respiratory phasicity and response to augmentation. Popliteal Vein: No evidence of thrombus. Normal compressibility, respiratory phasicity and response to augmentation. Calf Veins: No evidence of thrombus. Normal compressibility and flow on color Doppler imaging. Superficial Great Saphenous Vein: No evidence of thrombus. Normal compressibility. Venous Reflux:  None. Other Findings: There is a minimal amount of subcutaneous edema at the level of the left calf (image 64). IMPRESSION: No evidence of DVT within either lower extremity. Electronically Signed   By: Simonne Come M.D.   On: 02/13/2020 12:16   DG Chest Port 1 View  Result Date: 02/13/2020 CLINICAL DATA:  Acute on chronic respiratory failure EXAM: PORTABLE CHEST 1 VIEW COMPARISON:  Yesterday FINDINGS: Cardiomegaly with edema and diffuse vascular enlargement. There has been improvement since yesterday in the degree of pulmonary opacification. No visible effusion or pneumothorax. IMPRESSION: Improved CHF. Electronically Signed   By: Marnee Spring M.D.   On: 02/13/2020 07:34   DG Chest Portable 1 View  Result Date: 02/12/2020 CLINICAL DATA:  Shortness of breath EXAM: PORTABLE CHEST 1 VIEW COMPARISON:  06/17/2019 FINDINGS: Bilateral pulmonary opacities. Possible pleural effusions. Cardiomediastinal contours are obscured. There is cardiomegaly. No pneumothorax. IMPRESSION: Bilateral pulmonary opacities with cardiomegaly and possible pleural effusions. Favored to reflect pulmonary edema although pneumonia is not excluded. Electronically Signed   By: Guadlupe Spanish M.D.   On: 02/12/2020 13:41   ECHOCARDIOGRAM COMPLETE  Result Date: 02/13/2020    ECHOCARDIOGRAM REPORT   Patient Name:   YOCELINE BAZAR Date of Exam: 02/13/2020 Medical Rec #:  [id number]    Height:       62.0 in Accession #:    [id number]   Weight:  382.3 lb Date of Birth:   Nov 02, 1960    BSA:          2.518 m Patient Age:    59 years     BP:           132/83 mmHg Patient Gender: F            HR:           115 bpm. Exam Location:  [facility] Procedure: 2D Echo, Color Doppler, Cardiac Doppler and Intracardiac            Opacification Agent Indications:     I48.91 Atrial fibrillation  History:         Patient has no prior history of Echocardiogram examinations.                  COPD; Risk Factors:Hypertension and Sleep Apnea.  Sonographer:     Humphrey Rolls RDCS (AE) Referring Phys:  [id number] Tresa Endo A GRIFFITH Diagnosing Phys: Marcina Millard MD  Sonographer Comments: Technically difficult study due to poor echo windows. Image acquisition challenging due to patient body habitus and Image acquisition challenging due to COPD. IMPRESSIONS  1. Left ventricular ejection fraction, by estimation, is 45 to 50%. The left ventricle has mildly decreased function. The left ventricle has no regional wall motion abnormalities. Left ventricular diastolic parameters were normal.  2. Right ventricular systolic function is normal. The right ventricular size is normal.  3. The mitral valve is normal in structure. Mild mitral valve regurgitation. No evidence of mitral stenosis.  4. The aortic valve is normal in structure. Aortic valve regurgitation is not visualized. No aortic stenosis is present.  5. The inferior vena cava is normal in size with greater than 50% respiratory variability, suggesting right atrial pressure of 3 mmHg. FINDINGS  Left Ventricle: Left ventricular ejection fraction, by estimation, is 45 to 50%. The left ventricle has mildly decreased function. The left ventricle has no regional wall motion abnormalities. Definity contrast agent was given IV to delineate the left ventricular endocardial borders. The left ventricular internal cavity size was normal in size. There is no left ventricular hypertrophy. Left ventricular diastolic parameters were normal. Right Ventricle: The right ventricular size  is normal. No increase in right ventricular wall thickness. Right ventricular systolic function is normal. Left Atrium: Left atrial size was normal in size. Right Atrium: Right atrial size was normal in size. Pericardium: There is no evidence of pericardial effusion. Mitral Valve: The mitral valve is normal in structure. Mild mitral valve regurgitation. No evidence of mitral valve stenosis. MV peak gradient, 10.0 mmHg. The mean mitral valve gradient is 5.0 mmHg. Tricuspid Valve: The tricuspid valve is normal in structure. Tricuspid valve regurgitation is mild . No evidence of tricuspid stenosis. Aortic Valve: The aortic valve is normal in structure. Aortic valve regurgitation is not visualized. No aortic stenosis is present. Aortic valve mean gradient measures 4.0 mmHg. Aortic valve peak gradient measures 8.4 mmHg. Aortic valve area, by VTI measures 3.65 cm. Pulmonic Valve: The pulmonic valve was normal in structure. Pulmonic valve regurgitation is not visualized. No evidence of pulmonic stenosis. Aorta: The aortic root is normal in size and structure. Venous: The inferior vena cava is normal in size with greater than 50% respiratory variability, suggesting right atrial pressure of 3 mmHg. IAS/Shunts: No atrial level shunt detected by color flow Doppler.  LEFT VENTRICLE PLAX 2D LVIDd:         5.14 cm  Diastology LVIDs:  3.97 cm  LV e' medial:    9.79 cm/s LV PW:         1.25 cm  LV E/e' medial:  12.3 LV IVS:        1.25 cm  LV e' lateral:   11.00 cm/s LVOT diam:     2.40 cm  LV E/e' lateral: 11.0 LV SV:         81 LV SV Index:   32 LVOT Area:     4.52 cm  LEFT ATRIUM         Index LA diam:    3.80 cm 1.51 cm/m  AORTIC VALVE                   PULMONIC VALVE AV Area (Vmax):    3.68 cm    PV Vmax:       1.33 m/s AV Area (Vmean):   3.46 cm    PV Vmean:      86.100 cm/s AV Area (VTI):     3.65 cm    PV VTI:        0.301 m AV Vmax:           145.00 cm/s PV Peak grad:  7.1 mmHg AV Vmean:          91.200 cm/s  PV Mean grad:  3.0 mmHg AV VTI:            0.223 m AV Peak Grad:      8.4 mmHg AV Mean Grad:      4.0 mmHg LVOT Vmax:         118.00 cm/s LVOT Vmean:        69.700 cm/s LVOT VTI:          0.180 m LVOT/AV VTI ratio: 0.81  AORTA Ao Root diam: 3.10 cm MITRAL VALVE MV Area (PHT): 5.78 cm     SHUNTS MV Peak grad:  10.0 mmHg    Systemic VTI:  0.18 m MV Mean grad:  5.0 mmHg     Systemic Diam: 2.40 cm MV Vmax:       1.58 m/s MV Vmean:      106.0 cm/s MV Decel Time: 131 msec MV E velocity: 120.67 cm/s Marcina Millard MD Electronically signed by Marcina Millard MD Signature Date/Time: 02/13/2020/1:44:18 PM    Final     EKG: Atrial fibrillation at a rate of 113 bpm  ASSESSMENT AND PLAN:   1.  Persistent atrial fibrillation, at a rate of 113 bpm.  Patient has a history of atrial fibrillation on Eliquis for stroke prevention.  She was on Cardizem CD for rate control which was switched to metoprolol succinate during his hospitalization.  Heart rate remains 113 bpm.  Patient is asymptomatic, denies chest pain, palpitations or heart racing.  She has a history of amiodarone toxicity.  She has been considered for catheter ablation of atrial fibrillation has been deferred due to underlying obesity. 2.  Acute on chronic diastolic congestive heart failure.  Patient has a history of chronic lymphedema.  2D echocardiogram revealed preserved LVEF 45 to 50%.  Patient being treated with furosemide 40 mg twice daily with prompt diuresis and overall clinical improvement. 3.  Respiratory failure, likely due to COPD exacerbation, with underlying obstructive sleep apnea on CPAP, improving with bronchodilator, IV antibiotics, steroids, and diuresis.  Recommendations  1.  Agree with overall current therapy 2.  Continue diuresis 3.  Carefully monitor renal status 4.  If heart rate remains elevated despite  improving respiratory status, consider adding back Cardizem CD at lower dose 120 mg daily 5.  Continue Eliquis for stroke  prevention  Signed: Marcina Millard MD,PhD, [facility] 02/15/2020, 8:49 AM

## 2020-02-16 LAB — BASIC METABOLIC PANEL
Anion gap: 11 (ref 5–15)
BUN: 30 mg/dL — ABNORMAL HIGH (ref 6–20)
CO2: 37 mmol/L — ABNORMAL HIGH (ref 22–32)
Calcium: 8.3 mg/dL — ABNORMAL LOW (ref 8.9–10.3)
Chloride: 88 mmol/L — ABNORMAL LOW (ref 98–111)
Creatinine, Ser: 0.97 mg/dL (ref 0.44–1.00)
GFR, Estimated: >60 mL/min (ref >60)
Glucose, Bld: 151 mg/dL — ABNORMAL HIGH (ref 70–99)
Potassium: 3.8 mmol/L (ref 3.5–5.1)
Sodium: 136 mmol/L (ref 135–145)

## 2020-02-16 NOTE — Progress Notes (Signed)
   Heart Failure Nurse Navigator Note  HFmrEf 45-50% Normal right ventricular systolic function.  Mild MR.  Mild TR  She presented to the ED by way of EMS due to 2-3 day history of worsening SOB, Mild cough and edema. CXR mild pulmonary edema.  Patient was seen, requested by Dr. Denton Lank for Heart Failure teaching.  Co morbidities:  Persistent Atrial Fibrillation  COPD OSA compliant with CPAP( 2 Liter 02) Morbid Obesity  Medications:  Eliquis 5 mg BID Lasix 40 mg IV BID Metoprolol succinate 75 mg daily  Has history of amiodarone toxicity.  Labs:  Sodium 136, potassium 3.8, BUN 30 (33), Creatinine 0.97 (0.87)   Weight- 167.8 kg (was not weighed on 11/7) weight on 11/6 documented at 173.3 kg  BMI 67.6  BP  143/64  Intake 960 ml Output 2700 ml   Assessment:  General- she is awake and alert, lying in bed, has nagging dry cough, no acute distress.  HEENT- pupils equal, non icterus, unable to assess JVD due to body habitus.  Cardiac- heart tones distant, but irregular.No murmur or rubs.  Chest- clear with faint inspiratory wheeze  Abdomen- rounded soft, non tender  Musculoskeletal- lower extremity edema 2+, skin is wrinkled.  Reddened.  Psych is pleasant and appropriate, makes eye contact.  Neuro-moves all extremities, speech clear.   Spoke with patient regarding heart failure and how she can take better care of herself.  She states she knows she does not eat like she should, lives by herself and tends to eat convenience foods.  She says she starts her day with a big cup of coffee, then may not eat for several hours.  She states this last summer when her legs were being wrapped weekly she noted her weight to vary from 364 to 380 pounds.  Discussed weighing daily, recording.  She had been given the teaching booklet and zone magnet, went over what to report.  Also discussed reading labels, sticking with 2000 mg sodium diet, restricting fluid intake.  She states  she uses salt at the table, advised to change that habit.  Instructed on using Mrs. Dash or seasonings such as parsley, paprika, onion power, etc.  She states she knows it is not going to be easy but is motivated to do this to stay out of the hospital.  Will continue to follow.  Tresa Endo RN, CHFN

## 2020-02-16 NOTE — Progress Notes (Cosign Needed)
Physical Therapy Treatment Patient Details Name: Katherine Scott MRN: [id number] DOB: 1960-07-08 Today's Date: 02/16/2020    History of Present Illness Katherine Scott is a 59yoF who comes to [facility] on 11/4 c SOB adn increased supplemental O2 requirements. PMH inclusive of AF on eloquis, COPD, morbid obesity, lymphedema, OSA on CPAP, CRF on 2L continuous. Pt reports recent fevers, but in ED COVID and FLu tests are (-).    PT Comments    Pt in bed upon entry, agreeable to participate. Pt now on 6L HFNC v 15L on 11/5 (previous PT session). Supervision for supine to EOB, then supervision for STS with ad lib use of chair back for PRN UE support. Pt able to side step at counter on 6L HFNC. HR whilst seated and AMB, 130sbpm, no CP, dyspnea. Pt left up in chair to wash face, wash teeth at EOS, RN and student RN in room. Pt subjectively reporting ~10% weakness in BLE, ~25% increased unsteadiness both compared to baseline. O2 needs remain higher than baseline.   Follow Up Recommendations  SNF;Supervision for mobility/OOB     Equipment Recommendations  None recommended by PT    Recommendations for Other Services       Precautions / Restrictions Precautions Precautions: Fall Restrictions Weight Bearing Restrictions: No    Mobility  Bed Mobility Overal bed mobility: Needs Assistance Bed Mobility: Supine to Sit     Supine to sit: Supervision     General bed mobility comments: flat bed, use of Left rail to left EOB  Transfers Overall transfer level: Needs assistance Equipment used: None (chair back) Transfers: Sit to/from Stand Sit to Stand: Supervision         General transfer comment: uses back of guest chair for support, then toelrates standing for 2 minutes without issue. 6L HFNC, SpO2 89%  Ambulation/Gait Ambulation/Gait assistance: Supervision Gait Distance (Feet): 20 Feet Assistive device:  (countertop) Gait Pattern/deviations: Step-to pattern     General Gait Details: AMB  limited by HFNC, able to sidestep at counter top 2 rounf-trips, then sit in chair at sink to perform self care   Stairs             Wheelchair Mobility    Modified Rankin (Stroke Patients Only)       Balance                                            Cognition Arousal/Alertness: Awake/alert Behavior During Therapy: WFL for tasks assessed/performed Overall Cognitive Status: Within Functional Limits for tasks assessed                                        Exercises      General Comments        Pertinent Vitals/Pain Pain Assessment: No/denies pain    Home Living                      Prior Function            PT Goals (current goals can now be found in the care plan section) Acute Rehab PT Goals Patient Stated Goal: regain independent with mobility PT Goal Formulation: With patient Time For Goal Achievement: 02/27/20 Potential to Achieve Goals: Good Progress towards PT goals: Progressing toward goals  Frequency    Min 2X/week      PT Plan Current plan remains appropriate    Co-evaluation              AM-PAC PT "6 Clicks" Mobility   Outcome Measure  Help needed turning from your back to your side while in a flat bed without using bedrails?: A Little Help needed moving from lying on your back to sitting on the side of a flat bed without using bedrails?: A Little Help needed moving to and from a bed to a chair (including a wheelchair)?: A Little Help needed standing up from a chair using your arms (e.g., wheelchair or bedside chair)?: A Little Help needed to walk in hospital room?: A Little Help needed climbing 3-5 steps with a railing? : A Lot 6 Click Score: 17    End of Session Equipment Utilized During Treatment: Oxygen Activity Tolerance: Patient tolerated treatment well;No increased pain Patient left: in chair;with nursing/sitter in room Nurse Communication: Mobility status PT Visit  Diagnosis: Difficulty in walking, not elsewhere classified (R26.2);Muscle weakness (generalized) (M62.81)     Time: 5643-3295 PT Time Calculation (min) (ACUTE ONLY): 17 min  Charges:  $Therapeutic Exercise: 8-22 mins                     12:53 PM, 02/16/20 Rosamaria Lints, PT, DPT Physical Therapist - [facility]  [phone] (ASCOM)    [name] 02/16/2020, 12:50 PM

## 2020-02-16 NOTE — Progress Notes (Signed)
[facility]    SUBJECTIVE: The patient reports feeling better today than yesterday. She reports that her breathing is not quite back to baseline but is improved. She denies chest pain or palpitations. She has chronic lower extremity edema, which she states has also improved.   Vitals:   02/15/20 1926 02/15/20 2205 02/16/20 0453 02/16/20 0757  BP: 138/80  (!) 139/94 (!) 143/64  Pulse: 84  98 83  Resp: 18 20 16 17   Temp: 98.8 F (37.1 C)  98.1 F (36.7 C) 98.2 F (36.8 C)  TempSrc: Oral     SpO2: 93%  95% 97%  Weight:   (!) 167.8 kg   Height:         Intake/Output Summary (Last 24 hours) at 02/16/2020 13/11/2019 Last data filed at 02/15/2020 2100 Gross per 24 hour  Intake 960 ml  Output 2700 ml  Net -1740 ml      PHYSICAL EXAM  General: Well developed, well nourished, morbidly obese female sitting up in recliner in no acute distress HEENT:  Normocephalic and atramatic Neck: difficult to discern JVD due to thick neck Lungs: normal effort of breathing on supplemental oxygen via nasal cannula, with diminished breath sounds with slight crackles Heart: irregularly irregular without gallops or murmurs.  Msk: no obvious deformities. Normal strength and tone for age. Extremities: chronic venous statis skin changes with reddening and wrenkling of skin with mild edema.   Neuro: Alert and oriented X 3. Psych:  Good affect, responds appropriately   LABS: Basic Metabolic Panel: Recent Labs    02/14/20 0502 02/14/20 0502 02/15/20 0510 02/16/20 0342  NA 137   < > 139 136  K 4.6   < > 4.5 3.8  CL 94*   < > 94* 88*  CO2 33*   < > 35* 37*  GLUCOSE 178*   < > 194* 151*  BUN 37*   < > 33* 30*  CREATININE 1.10*   < > 0.87 0.97  CALCIUM 9.2   < > 8.6* 8.3*  MG 2.6*  --   --   --    < > = values in this interval not displayed.   Liver Function Tests: No results for input(s): AST, ALT, ALKPHOS, BILITOT, PROT, ALBUMIN in the last 72 hours. No results for input(s): LIPASE, AMYLASE in  the last 72 hours. CBC: Recent Labs    02/14/20 0502  WBC 9.9  HGB 11.0*  HCT 36.2  MCV 95.5  PLT 229   Cardiac Enzymes: No results for input(s): CKTOTAL, CKMB, CKMBINDEX, TROPONINI in the last 72 hours. BNP: Invalid input(s): POCBNP D-Dimer: No results for input(s): DDIMER in the last 72 hours. Hemoglobin A1C: No results for input(s): HGBA1C in the last 72 hours. Fasting Lipid Panel: No results for input(s): CHOL, HDL, LDLCALC, TRIG, CHOLHDL, LDLDIRECT in the last 72 hours. Thyroid Function Tests: No results for input(s): TSH, T4TOTAL, T3FREE, THYROIDAB in the last 72 hours.  Invalid input(s): FREET3 Anemia Panel: No results for input(s): VITAMINB12, FOLATE, FERRITIN, TIBC, IRON, RETICCTPCT in the last 72 hours.  No results found.   Echo 02/13/2020, LVEF 45-50%, no RWMA, mild MR  TELEMETRY: atrial fibrillation, rates 107-114 bpm  ASSESSMENT AND PLAN:  Principal Problem:   Acute on chronic respiratory failure with hypoxia (HCC) Active Problems:   Persistent atrial fibrillation (HCC)   Acquired hypothyroidism   BMI 60.0-69.9, adult (HCC)   Lymphedema   Shortness of breath   COPD with acute exacerbation (HCC)   Pulmonary edema  1.  Persistent atrial fibrillation, at a rate of 109-114 bpm.  Patient has a history of atrial fibrillation on Eliquis for stroke prevention.  She was on Cardizem CD for rate control which was switched to metoprolol succinate during his hospitalization.  Heart rate remains 113 bpm.  Patient is asymptomatic, denies chest pain, palpitations or heart racing.  She has a history of amiodarone toxicity.  She has been considered for catheter ablation of atrial fibrillation has been deferred due to underlying obesity. 2.  Acute on chronic diastolic congestive heart failure.  Patient has a history of chronic lymphedema.  2D echocardiogram revealed preserved LVEF 45 to 50%.  Patient being treated with furosemide 40 mg twice daily with prompt diuresis and  overall clinical improvement. 3.  Respiratory failure, likely due to COPD exacerbation, with underlying obstructive sleep apnea on CPAP, improving with bronchodilator, IV antibiotics, steroids, and diuresis.  Recommendations: 1. Continue Eliquis for stroke prevention 2. Continue IV Lasix 40 mg BID with careful monitoring of renal status, daily weight, and I&Os 3. Continue metoprolol succinate 75 mg daily for rate control    Leanora Ivanoff, PA-C 02/16/2020 9:09 AM

## 2020-02-16 NOTE — Plan of Care (Signed)
  Problem: Education: Goal: Knowledge of General Education information will improve Description: Including pain rating scale, medication(s)/side effects and non-pharmacologic comfort measures Outcome: Progressing   Problem: Clinical Measurements: Goal: Ability to maintain clinical measurements within normal limits will improve Outcome: Progressing Goal: Respiratory complications will improve Outcome: Progressing   Problem: Elimination: Goal: Will not experience complications related to bowel motility Outcome: Progressing   Problem: Pain Managment: Goal: General experience of comfort will improve Outcome: Completed/Met

## 2020-02-16 NOTE — Progress Notes (Signed)
PROGRESS NOTE    Katherine Scott   ZMO:[id number]  DOB: 09/14/1960  PCP: Olin Hauser, DO    DOA: 02/12/2020 LOS: 4   Brief Narrative   Katherine Scott is a 59 y.o. female with medical history significant of persistent atrial fibrillation on Eliquis, COPD, morbid obesity, lymphedema, OSA on CPAP at night, chronic hypoxic respiratory failure on 2 L/min oxygen who presented to the ED from home via EMS on 02/12/20 due to worsening shortness of breath and increased oxygen requirements, productive cough.  She was in respiratory distress on arrival and placed on BiPAP in the ED.  Admitted for multifactorial acute on chronic respiratory failure with hypoxia secondary to pulmonary edema, exacerbation of COPD and possible pneumonia.     Assessment & Plan   Principal Problem:   Acute on chronic respiratory failure with hypoxia (HCC) Active Problems:   COPD with acute exacerbation (HCC)   Pulmonary edema   Persistent atrial fibrillation (HCC)   Acquired hypothyroidism   BMI 60.0-69.9, adult (HCC)   Lymphedema   Shortness of breath   Acute on chronic respiratory failure with hypoxia -at baseline uses 1 L/min, presented with dyspnea and requiring BiPAP, requiring 4 L/min at home continuously for the past couple days.  Chest x-ray with diffuse bilateral opacities which could reflect pulmonary edema or pneumonia.  Procal negative.  D-dimer elevated but can be explained by OSA / OHS and ?pulmonary hypertension.  Degree of hypoxia consistent with her chest xray, so low suspicion for PE.  Bilateral LE dopplers negative for DVT's bilaterally.  Pulmonary Edema due to acute on chronic diastolic CHF- suspect pulmonary hypertension contributing.  BNP was mildly elevated. Echo 11/5 - EF 03-54%, normal diastolic function, mild MR.    Suspect possible pulmonary hypertension contributing given underlying OSA (prior CT chest showed enlarged pulmonary a.).  COPD with acute exacerbation - ruled out.    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   Patient does not have diagnosis COPD after further chart review including PFT's and recent pulmonology notes.  She did present with diffuse expiratory wheezing and diminished aeration which has improved with IV steroids.  Has been on steroids outpatient, tapered off recently, still using intermittently when more SOB.  Not clear amiodarone toxicity still a factor.  Pulmonology notes & prior imaging briefly reviewed.   --Close pulm follow up --monitor now off steroids, low threshold to resume if wheezing  Shortness of breath - POA, due to above.  11/5 - repeat CXR with improved opacities after diuresis 11/6 - improving edema and O2 requirement down to 10 from 15 L/min HFNC, no wheezing 11/7 - edema further improved, on 6 L/min oxygen, no wheezing, put out 3.8 L yesterday 11/8 - O2 desaturations on exertion w DOE as well, good output w diuresis PLAN: --[facility] cardiology following (pt sees Dr. Ubaldo Glassing) --Close pulmonology follow up outpatient --Diltiazem was changed to Toprol (may help w edema, EF mildly reduced, but monitor for bronchospasm) --Continue Zithromax x 5 day course --transitioned to prednisone today (11/7), day 4 of steroids, will stop and monitor clinically --Scheduled DuoNebs every 6 hours while awake --As needed albuterol nebs --Continue formulary equivalents for Trelegy (Breo & Incruse ellipta) --Schedule Mucinex BID --Tylenol as needed fevers   Persistent atrial fibrillation with RVR -presented with rapid A. fib initial heart rate 131, EKG confirming A. fib with rate of 114 in the ED.  Potentially contributing to pulmonary edema. 11/6 Change diltiazem to metoprolol succinate given  mildly reduced EF and chronic edema (monitor COPD on beta blocker) 11/7 - still intermittently tachycardic, mostly with exertion --Continue Toprol XL --Continue Eliquis --Telemetry --Monitor K, Mg  Acquired hypothyroidism -continue  levothyroxine.  TSH normal 0.659.  OSA - CPAP ordered  Morbid obesity - Body mass index is 68.41 kg/m.  Complicates overall care and prognosis.    Lymphedema -appears stable.  Monitor.  Diuresis as above.    Recent lower extremity cellulitis -completed course of Bactrim.      DVT prophylaxis:  apixaban (ELIQUIS) tablet 5 mg   Diet:  Diet Orders (From admission, onward)    Start     Ordered   02/12/20 1539  Diet Heart Room service appropriate? Yes; Fluid consistency: Thin  Diet effective now       Question Answer Comment  Room service appropriate? Yes   Fluid consistency: Thin      02/12/20 1543            Code Status: Full Code    Subjective 02/16/20    Pt seen up in chair after working with therapy today.  She'd been on 4 L/min O2, had to be turned up to 6L due to desat on exertion.  She reports feeling better today with therapy.  Met with CHF navigator earlier.  Still short of breath w exertion but getting better.  Does not feel tight or notice wheezing.   Disposition Plan & Communication   Status is: Inpatient  Inpatient status remains appropriate due to severity of illness and requirement for IV medications as above.  Ongoing IV diuresis. Weaning oxygen.  Dispo: The patient is from: home              Anticipated d/c is to: home              Anticipated d/c date is: 2-3 days              Medically stable for d/c?: No    Family Communication: none at bedside.  Pt able to update.    Consults, Procedures, Significant Events   Consultants:   None  Procedures:   Echo 02/13/20  Antimicrobials:  Anti-infectives (From admission, onward)   Start     Dose/Rate Route Frequency Ordered Stop   02/12/20 1615  cefTRIAXone (ROCEPHIN) 2 g in sodium chloride 0.9 % 100 mL IVPB  Status:  Discontinued        2 g 200 mL/hr over 30 Minutes Intravenous Every 24 hours 02/12/20 1609 02/12/20 1610   02/12/20 1615  azithromycin (ZITHROMAX) tablet 500 mg  Status:   Discontinued        500 mg Oral Daily 02/12/20 1609 02/12/20 1610   02/12/20 1515  cefTRIAXone (ROCEPHIN) 2 g in sodium chloride 0.9 % 100 mL IVPB  Status:  Discontinued        2 g 200 mL/hr over 30 Minutes Intravenous Every 24 hours 02/12/20 1509 02/13/20 0907   02/12/20 1515  azithromycin (ZITHROMAX) 500 mg in sodium chloride 0.9 % 250 mL IVPB        500 mg 250 mL/hr over 60 Minutes Intravenous Every 24 hours 02/12/20 1509 02/16/20 1522        Objective   Vitals:   02/16/20 0453 02/16/20 0757 02/16/20 1146 02/16/20 1603  BP: (!) 139/94 (!) 143/64 127/75 134/81  Pulse: 98 83 86 92  Resp: _0 Temp: 98.1 F (36.7 C) 98.2 F (36.8 C) 98 F (36.7 C) 98.4 F (  36.9 C)  TempSrc:  Oral Oral Oral  SpO2: 95% 97% 98% 95%  Weight: (!) 167.8 kg     Height:        Intake/Output Summary (Last 24 hours) at 02/16/2020 2025 Last data filed at 02/16/2020 1748 Gross per 24 hour  Intake 970 ml  Output 3600 ml  Net -2630 ml   Filed Weights   02/13/20 0345 02/14/20 0618 02/16/20 0453  Weight: (!) 173.4 kg (!) 173.3 kg (!) 167.8 kg    Physical Exam:  General exam: awake, alert, no acute distress, obese Respiratory system: CTAB, no wheezing or rhonchi, on 6 L/min oxygen after PT Cardiovascular system: normal S1/S2, RRR, edema of bilateral LE's as below. Central nervous system: A&O x4. no gross focal neurologic deficits, normal speech Extremities: moves all, bilateral LE's edema has continued to improved with further increased skin wrinkling, stable BLE erythema without differential warmth  Labs   Data Reviewed: I have personally reviewed following labs and imaging studies  CBC: Recent Labs  Lab 02/12/20 1440 02/13/20 0508 02/14/20 0502  WBC 12.6* 9.0 9.9  NEUTROABS 11.8*  --   --   HGB 12.2 11.3* 11.0*  HCT 39.7 37.0 36.2  MCV 94.7 93.9 95.5  PLT 204 210 735   Basic Metabolic Panel: Recent Labs  Lab 02/12/20 1258 02/13/20 0508 02/14/20 0502 02/15/20 0510  02/16/20 0342  NA 137 136 137 139 136  K 4.7 5.2* 4.6 4.5 3.8  CL 95* 97* 94* 94* 88*  CO2 32 29 33* 35* 37*  GLUCOSE 130* 171* 178* 194* 151*  BUN 19 26* 37* 33* 30*  CREATININE 1.02* 1.10* 1.10* 0.87 0.97  CALCIUM 8.8* 8.8* 9.2 8.6* 8.3*  MG  --   --  2.6*  --   --    GFR: Estimated Creatinine Clearance: 95.8 mL/min (by C-G formula based on SCr of 0.97 mg/dL). Liver Function Tests: Recent Labs  Lab 02/12/20 1258  AST 15  ALT 17  ALKPHOS 63  BILITOT 1.5*  PROT 7.3  ALBUMIN 3.5   No results for input(s): LIPASE, AMYLASE in the last 168 hours. No results for input(s): AMMONIA in the last 168 hours. Coagulation Profile: No results for input(s): INR, PROTIME in the last 168 hours. Cardiac Enzymes: No results for input(s): CKTOTAL, CKMB, CKMBINDEX, TROPONINI in the last 168 hours. BNP (last 3 results) No results for input(s): PROBNP in the last 8760 hours. HbA1C: No results for input(s): HGBA1C in the last 72 hours. CBG: No results for input(s): GLUCAP in the last 168 hours. Lipid Profile: No results for input(s): CHOL, HDL, LDLCALC, TRIG, CHOLHDL, LDLDIRECT in the last 72 hours. Thyroid Function Tests: No results for input(s): TSH, T4TOTAL, FREET4, T3FREE, THYROIDAB in the last 72 hours. Anemia Panel: No results for input(s): VITAMINB12, FOLATE, FERRITIN, TIBC, IRON, RETICCTPCT in the last 72 hours. Sepsis Labs: Recent Labs  Lab 02/12/20 1258 02/12/20 1533  PROCALCITON  --  0.10  LATICACIDVEN 1.5  --     Recent Results (from the past 240 hour(s))  Respiratory Panel by RT PCR (Flu A&B, Covid) - Nasopharyngeal Swab     Status: None   Collection Time: 02/12/20 12:58 PM   Specimen: Nasopharyngeal Swab  Result Value Ref Range Status   SARS Coronavirus 2 by RT PCR NEGATIVE NEGATIVE Final    Comment: (NOTE) SARS-CoV-2 target nucleic acids are NOT DETECTED.  The SARS-CoV-2 RNA is generally detectable in upper respiratoy specimens during the acute phase of infection.  The lowest  concentration of SARS-CoV-2 viral copies this assay can detect is 131 copies/mL. A negative result does not preclude SARS-Cov-2 infection and should not be used as the sole basis for treatment or other patient management decisions. A negative result may occur with  improper specimen collection/handling, submission of specimen other than nasopharyngeal swab, presence of viral mutation(s) within the areas targeted by this assay, and inadequate number of viral copies (<131 copies/mL). A negative result must be combined with clinical observations, patient history, and epidemiological information. The expected result is Negative.  Fact Sheet for Patients:  PinkCheek.be  Fact Sheet for Healthcare Providers:  GravelBags.it  This test is no t yet approved or cleared by the [place] FDA and  has been authorized for detection and/or diagnosis of SARS-CoV-2 by FDA under an Emergency Use Authorization (EUA). This EUA will remain  in effect (meaning this test can be used) for the duration of the COVID-19 declaration under Section 564(b)(1) of the Act, 21 U.S.C. section 360bbb-3(b)(1), unless the authorization is terminated or revoked sooner.     Influenza A by PCR NEGATIVE NEGATIVE Final   Influenza B by PCR NEGATIVE NEGATIVE Final    Comment: (NOTE) The Xpert Xpress SARS-CoV-2/FLU/RSV assay is intended as an aid in  the diagnosis of influenza from Nasopharyngeal swab specimens and  should not be used as a sole basis for treatment. Nasal washings and  aspirates are unacceptable for Xpert Xpress SARS-CoV-2/FLU/RSV  testing.  Fact Sheet for Patients: PinkCheek.be  Fact Sheet for Healthcare Providers: GravelBags.it  This test is not yet approved or cleared by the [place] FDA and  has been authorized for detection and/or diagnosis of SARS-CoV-2 by  FDA  under an Emergency Use Authorization (EUA). This EUA will remain  in effect (meaning this test can be used) for the duration of the  Covid-19 declaration under Section 564(b)(1) of the Act, 21  U.S.C. section 360bbb-3(b)(1), unless the authorization is  terminated or revoked. Performed at [facility], [address] [place], [place] [address]   Culture, blood (single)     Status: None (Preliminary result)   Collection Time: 02/12/20  3:32 PM   Specimen: BLOOD  Result Value Ref Range Status   Specimen Description BLOOD BLOOD LEFT FOREARM  Final   Special Requests   Final    BOTTLES DRAWN AEROBIC AND ANAEROBIC Blood Culture adequate volume   Culture   Final    NO GROWTH 4 DAYS Performed at [facility], [address] [place], [place] [address]    Report Status PENDING  Incomplete      Imaging Studies   No results found.   Medications   Scheduled Meds: . apixaban  5 mg Oral BID  . fluticasone furoate-vilanterol  1 puff Inhalation Daily   And  . umeclidinium bromide  1 puff Inhalation Daily  . furosemide  40 mg Intravenous BID  . guaiFENesin  600 mg Oral BID  . influenza vac split quadrivalent PF  0.5 mL Intramuscular Tomorrow-1000  . levothyroxine  75 mcg Oral Q0600  . metoprolol succinate  75 mg Oral Daily  . sodium chloride flush  10 mL Intravenous Q12H  . cyanocobalamin  2,000 mcg Oral Daily   Continuous Infusions:      LOS: 4 days    Time spent: 25 minutes with > 50% spent in coordination of care and direct patient contact.    Ezekiel Slocumb, DO [facility]  02/16/2020, 8:25 PM    If 7PM-7AM,  please contact night-coverage. How to contact the [facility] Attending or Consulting provider [place] or covering provider during after hours [place], for this patient?    1. Check the care team in [facility] and look for a) attending/consulting [facility] provider listed and b) the [facility] team listed 2. Log into [email or web address] and use [place]'s  universal password to access. If you do not have the password, please contact the hospital operator. 3. Locate the [facility] provider you are looking for under [facility] and page to a number that you can be directly reached. 4. If you still have difficulty reaching the provider, please page the [facility] (Director on Call) for the Hospitalists listed on amion for assistance.

## 2020-02-16 NOTE — TOC Initial Note (Signed)
Transition of Care [facility]) - Initial/Assessment Note    Patient Details  Name: Katherine Scott MRN: [id number] Date of Birth: June 25, 1960  Transition of Care [facility]) CM/SW Contact:    Hetty Ely, RN Phone Number: 02/16/2020, 2:09 PM  Clinical Narrative:   Patient alert and oriented x4, states she lives alone and independent with ADL's, drives and able to do her own shopping, prepare meals and [facility] medications [facility] [place]. However sometimes if needed [facility] will deliver [facility]. Able to ambulate inside home independent without use of cane, however when out of the home uses cane for balance and safety. Uses 1.5-2l oxygen at night. Understands current condition may require SNF for Rehab, however prefers to go home. Discharge place pending at this time, patient states she was informed by doctor if oxygen level improves she can go home. Patient cooperative and receptive to doctors plan of care.                Expected Discharge Plan: Home/Self Care (Patient prefers home, however will go to SNF is condition requires.) Barriers to Discharge: Continued Medical Work up   Patient Goals and CMS Choice        Expected Discharge Plan and Services Expected Discharge Plan: Home/Self Care (Patient prefers home, however will go to SNF is condition requires.)       Living arrangements for the past 2 months: Single Family Home                                      Prior Living Arrangements/Services Living arrangements for the past 2 months: Single Family Home Lives with:: Self, Other (Comment) (Sister lives block away and 2 sons live in [place] all great support.) Patient language and need for interpreter reviewed:: Yes Do you feel safe going back to the place where you live?: Yes (However will go to Rehab if condition warrant it.)      Need for Family Participation in Patient Care: Yes (Comment) (Sister and 2sons available if needed.) Care giver support system in place?: Yes  (comment) Current home services: Other (comment) (Oxygen at night) Criminal Activity/Legal Involvement Pertinent to Current Situation/Hospitalization: No - Comment as needed  Activities of Daily Living Home Assistive Devices/Equipment: Oxygen (cane) ADL Screening (condition at time of admission) Patient's cognitive ability adequate to safely complete daily activities?: Yes Is the patient deaf or have difficulty hearing?: No Does the patient have difficulty seeing, even when wearing glasses/contacts?: No Does the patient have difficulty concentrating, remembering, or making decisions?: No Patient able to express need for assistance with ADLs?: No Does the patient have difficulty dressing or bathing?: Yes Independently performs ADLs?: Yes (appropriate for developmental age) Does the patient have difficulty walking or climbing stairs?: No Weakness of Legs: Both Weakness of Arms/Hands: None  Permission Sought/Granted                  Emotional Assessment Appearance:: Appears stated age Attitude/Demeanor/Rapport: Self-Confident, Gracious Affect (typically observed): Accepting, Adaptable, Calm Orientation: : Oriented to Self, Oriented to Place, Oriented to  Time, Oriented to Situation Alcohol / Substance Use: Not Applicable Psych Involvement: No (comment)  Admission diagnosis:  Acute respiratory failure with hypoxia (HCC) [J96.01] Acute on chronic respiratory failure with hypoxia (HCC) [J96.21] Patient Active Problem List   Diagnosis Date Noted  . Shortness of breath 02/12/2020  . COPD with acute exacerbation (HCC) 02/12/2020  .  Pulmonary edema 02/12/2020  . Venous ulcer (HCC) 10/09/2019  . Acute on chronic respiratory failure with hypoxia (HCC) 03/20/2019  . Lymphedema 02/26/2017  . 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 Acquired hypothyroidism 08/11/2016  . BMI 60.0-69.9, adult (HCC) 08/11/2016  . Persistent atrial fibrillation (HCC) 06/17/2014   PCP:   Smitty Cords, DO Pharmacy:   [phone] [place] [place] - [address] [place] [address] Phone: [phone] Fax: [phone]     Social Determinants of Health (SDOH) Interventions    Readmission Risk Interventions Readmission Risk Prevention Plan 02/16/2020 02/08/2019  Post Dischage Appt - Complete  Medication Screening - Complete  Transportation Screening Complete Complete  PCP or Specialist Appt within 5-7 Days Not Complete -  Home Care Screening Not Complete -  Medication Review (RN CM) Complete -  Some recent data might be hidden

## 2020-02-16 NOTE — Progress Notes (Signed)
Occupational Therapy Treatment Patient Details Name: Katherine Scott MRN: [id number] DOB: 1960-12-25 Today's Date: 02/16/2020    History of present illness Natajah Derderian is a 59yoF who comes to [facility] on 11/4 c SOB adn increased supplemental O2 requirements. PMH inclusive of AF on eloquis, COPD, morbid obesity, lymphedema, OSA on CPAP, CRF on 2L continuous. Pt reports recent fevers, but in ED COVID and FLu tests are (-).   OT comments  Upon entering the room, pt seated in recliner chair with no c/o pain. Pt on 7 L HFNC throughout this session. Pt's O2 saturation remains above 93% with HR increasing to 120s with therapeutic exercise. OT educating pt on exercises she can do while in the hospital and when at home with use of simple towel. Pt must pull towel taut with B UEs. Pt performing 2 sets of 10 chest presses, straight arm raises, forward rows, and backwards rows with min cuing for proper technique. Pt taking rest break as needed with min cuing for pursed lip breathing. Pt verbalized fatigue at end of session but requests to remain in recliner chair at end of session. All needs within reach.  Follow Up Recommendations  SNF    Equipment Recommendations  Other (comment) (defer to next venue of care)       Precautions / Restrictions Precautions Precautions: Fall Restrictions Weight Bearing Restrictions: No       Mobility Bed Mobility Overal bed mobility: Needs Assistance Bed Mobility: Supine to Sit     Supine to sit: Supervision     General bed mobility comments: seated in recliner chair upon entering the room  Transfers Overall transfer level: Needs assistance Equipment used: None (chair back) Transfers: Sit to/from Stand Sit to Stand: Supervision         General transfer comment: uses back of guest chair for support, then toelrates standing for 2 minutes without issue. 6L HFNC, SpO2 89%    Balance Overall balance assessment: Needs assistance Sitting-balance support: Single  extremity supported;Feet supported Sitting balance-Leahy Scale: Good      ADL either performed or assessed with clinical judgement     Cognition Arousal/Alertness: Awake/alert Behavior During Therapy: WFL for tasks assessed/performed Overall Cognitive Status: Within Functional Limits for tasks assessed                        Pertinent Vitals/ Pain       Pain Assessment: No/denies pain         Frequency  Min 2X/week        Progress Toward Goals  OT Goals(current goals can now be found in the care plan section)  Progress towards OT goals: Progressing toward goals  Acute Rehab OT Goals Patient Stated Goal: move around better and use less oxygen OT Goal Formulation: With patient Time For Goal Achievement: 02/28/20 Potential to Achieve Goals: Good  Plan Discharge plan remains appropriate       AM-PAC OT "6 Clicks" Daily Activity     Outcome Measure   Help from another person eating meals?: None Help from another person taking care of personal grooming?: A Little Help from another person toileting, which includes using toliet, bedpan, or urinal?: A Lot Help from another person bathing (including washing, rinsing, drying)?: A Lot Help from another person to put on and taking off regular upper body clothing?: A Little Help from another person to put on and taking off regular lower body clothing?: A Lot 6 Click Score: 16  End of Session Equipment Utilized During Treatment: Oxygen;Other (comment) (7L HFNC)  OT Visit Diagnosis: Other abnormalities of gait and mobility (R26.89);Muscle weakness (generalized) (M62.81)   Activity Tolerance Patient tolerated treatment well   Patient Left with call bell/phone within reach;in chair   Nurse Communication Mobility status;Other (comment) (IV pump beeping)        Time: 4166-0630 OT Time Calculation (min): 24 min  Charges: OT General Charges $OT Visit: 1 Visit OT Treatments $Therapeutic Exercise: 23-37  mins  Jackquline Denmark, MS, OTR/L , CBIS ascom [phone]  02/16/20, 3:48 PM

## 2020-02-17 LAB — CULTURE, BLOOD (SINGLE)
Culture: NO GROWTH
Special Requests: ADEQUATE

## 2020-02-17 LAB — CBC
HCT: 40.7 % (ref 36.0–46.0)
Hemoglobin: 12.5 g/dL (ref 12.0–15.0)
MCH: 29.1 pg (ref 26.0–34.0)
MCHC: 30.7 g/dL (ref 30.0–36.0)
MCV: 94.9 fL (ref 80.0–100.0)
Platelets: 207 10*3/uL (ref 150–400)
RBC: 4.29 MIL/uL (ref 3.87–5.11)
RDW: 15.6 % — ABNORMAL HIGH (ref 11.5–15.5)
WBC: 7.8 10*3/uL (ref 4.0–10.5)
nRBC: 0 % (ref 0.0–0.2)

## 2020-02-17 LAB — BASIC METABOLIC PANEL
Anion gap: 10 (ref 5–15)
BUN: 27 mg/dL — ABNORMAL HIGH (ref 6–20)
CO2: 40 mmol/L — ABNORMAL HIGH (ref 22–32)
Calcium: 8.3 mg/dL — ABNORMAL LOW (ref 8.9–10.3)
Chloride: 90 mmol/L — ABNORMAL LOW (ref 98–111)
Creatinine, Ser: 0.91 mg/dL (ref 0.44–1.00)
GFR, Estimated: >60 mL/min (ref >60)
Glucose, Bld: 109 mg/dL — ABNORMAL HIGH (ref 70–99)
Potassium: 3.7 mmol/L (ref 3.5–5.1)
Sodium: 140 mmol/L (ref 135–145)

## 2020-02-17 MED ORDER — ALBUTEROL SULFATE (2.5 MG/3ML) 0.083% IN NEBU: 2.5000 mg | INHALATION_SOLUTION | RESPIRATORY_TRACT | Status: DC | PRN

## 2020-02-17 MED ORDER — INFLUENZA VAC SPLIT QUAD 0.5 ML IM SUSY
0.5000 mL | PREFILLED_SYRINGE | INTRAMUSCULAR | Status: AC
  Administered 2020-02-20: 0.5 mL via INTRAMUSCULAR
  Filled 2020-02-17: qty 0.5

## 2020-02-17 MED ORDER — IPRATROPIUM-ALBUTEROL 0.5-2.5 (3) MG/3ML IN SOLN
3.0000 mL | Freq: Four times a day (QID) | RESPIRATORY_TRACT | Status: DC
  Administered 2020-02-17 – 2020-02-18 (×4): 3 mL via RESPIRATORY_TRACT
  Filled 2020-02-17 (×5): qty 3

## 2020-02-17 NOTE — TOC Progression Note (Signed)
Transition of Care [facility]) - Progression Note    Patient Details  Name: CYRA SPADER MRN: [id number] Date of Birth: 12/29/1960  Transition of Care [facility]) CM/SW Contact  Hetty Ely, RN Phone Number: 02/17/2020, 9:10 AM  Clinical Narrative:   SNF recommended by Therapy and Bed request and FL2 completed, awaiting bed offer.    Expected Discharge Plan: Home/Self Care (Patient prefers home, however will go to SNF is condition requires.) Barriers to Discharge: Continued Medical Work up  Expected Discharge Plan and Services Expected Discharge Plan: Home/Self Care (Patient prefers home, however will go to SNF is condition requires.)       Living arrangements for the past 2 months: Single Family Home                                       Social Determinants of Health (SDOH) Interventions    Readmission Risk Interventions Readmission Risk Prevention Plan 02/16/2020 02/08/2019  Post Dischage Appt - Complete  Medication Screening - Complete  Transportation Screening Complete Complete  PCP or Specialist Appt within 5-7 Days Not Complete -  Home Care Screening Not Complete -  Medication Review (RN CM) Complete -  Some recent data might be hidden

## 2020-02-17 NOTE — Progress Notes (Signed)
[facility] Cardiology    SUBJECTIVE: The patient reports feeling better this morning, breathing better. She denies chest pain or palpitations.    Vitals:   02/16/20 1603 02/16/20 2059 02/17/20 0421 02/17/20 0740  BP: 134/81 (!) 126/56 124/78 133/82  Pulse: 92 92 95 90  Resp: 17 (!) 21 17 17   Temp: 98.4 F (36.9 C) 98.5 F (36.9 C) 98.4 F (36.9 C) 97.9 F (36.6 C)  TempSrc: Oral Oral Oral Oral  SpO2: 95% 93% 94% 96%  Weight:   (!) 165.7 kg   Height:         Intake/Output Summary (Last 24 hours) at 02/17/2020 1054 Last data filed at 02/17/2020 0950 Gross per 24 hour  Intake 720 ml  Output 1900 ml  Net -1180 ml      PHYSICAL EXAM  General: Well developed, well nourished, in no acute distress, sitting up in bed eating breakfast, appears comfortable. HEENT:  Normocephalic and atramatic Neck:  No JVD.  Lungs: Clear bilaterally to auscultation, no wheezing or crackles Heart: irregularly irregular,without gallops or murmurs.  Abdomen: no obvious distention Msk:  Gait not assessed. Normal strength and tone for age. Extremities: mild bilateral lower extremity edema with chronic venous stasis skin changes with wrinkling.  Neuro: Alert and oriented X 3. Psych:  Good affect, responds appropriately   LABS: Basic Metabolic Panel: Recent Labs    02/16/20 0342 02/17/20 0545  NA 136 140  K 3.8 3.7  CL 88* 90*  CO2 37* 40*  GLUCOSE 151* 109*  BUN 30* 27*  CREATININE 0.97 0.91  CALCIUM 8.3* 8.3*   Liver Function Tests: No results for input(s): AST, ALT, ALKPHOS, BILITOT, PROT, ALBUMIN in the last 72 hours. No results for input(s): LIPASE, AMYLASE in the last 72 hours. CBC: Recent Labs    02/17/20 0545  WBC 7.8  HGB 12.5  HCT 40.7  MCV 94.9  PLT 207   Cardiac Enzymes: No results for input(s): CKTOTAL, CKMB, CKMBINDEX, TROPONINI in the last 72 hours. BNP: Invalid input(s): POCBNP D-Dimer: No results for input(s): DDIMER in the last 72 hours. Hemoglobin A1C: No  results for input(s): HGBA1C in the last 72 hours. Fasting Lipid Panel: No results for input(s): CHOL, HDL, LDLCALC, TRIG, CHOLHDL, LDLDIRECT in the last 72 hours. Thyroid Function Tests: No results for input(s): TSH, T4TOTAL, T3FREE, THYROIDAB in the last 72 hours.  Invalid input(s): FREET3 Anemia Panel: No results for input(s): VITAMINB12, FOLATE, FERRITIN, TIBC, IRON, RETICCTPCT in the last 72 hours.  No results found.   TELEMETRY: atrial fibrillation, low 100s-110 bpm  ASSESSMENT AND PLAN:  Principal Problem:   Acute on chronic respiratory failure with hypoxia (HCC) Active Problems:   Persistent atrial fibrillation (HCC)   Acquired hypothyroidism   BMI 60.0-69.9, adult (HCC)   Lymphedema   Shortness of breath   COPD with acute exacerbation (HCC)   Pulmonary edema    1.Persistent atrial fibrillation,at a rate of low 100s to 110 bpm, asymptomatic. Patient has a history of atrial fibrillation on Eliquis for stroke prevention. She was on Cardizem CD for rate control which was switched to metoprolol succinate during his hospitalization. Heart rate remains 113 bpm. Patient is asymptomatic, denies chest pain, palpitations or heart racing. She has a history of amiodarone toxicity. She has been considered for catheter ablation of atrial fibrillation has been deferred due to underlying obesity. 2.Acute on chronic diastolic congestive heart failure. Patient has a history of chronic lymphedema. 2D echocardiogram revealed preserved LVEF 45 to 50%. Patient being  treated with furosemide 40 mg twice daily with prompt diuresis and overall clinical improvement. 3.Respiratory failure, likely due to COPD exacerbation, with underlying obstructivesleep apneaon CPAP, improving with bronchodilator, IV antibiotics, steroids, and diuresis.  Recommendations: 1. Continue Eliquis for stroke prevention 2. Continue IV Lasix 40 mg; recommend switching to oral Lasix 40 mg daily by  discharge 3. Continue metoprolol succinate 75 mg daily for rate control 4. COPD management per hospitalist 5. Sign off for now; please call/haiku with any questions   Leanora Ivanoff, PA-C 02/17/2020 10:54 AM

## 2020-02-17 NOTE — Progress Notes (Signed)
PROGRESS NOTE    Katherine Scott   ZOX:[id number]  DOB: 04-06-1961  PCP: Smitty Cords, DO    DOA: 02/12/2020 LOS: 5   Brief Narrative   Katherine Scott is a 59 y.o. female with medical history significant of persistent atrial fibrillation on Eliquis, COPD, morbid obesity, lymphedema, OSA on CPAP at night, chronic hypoxic respiratory failure on 2 L/min oxygen who presented to the ED from home via EMS on 02/12/20 due to worsening shortness of breath and increased oxygen requirements, productive cough.  She was in respiratory distress on arrival and placed on BiPAP in the ED.  Admitted for multifactorial acute on chronic respiratory failure with hypoxia secondary to pulmonary edema, exacerbation of COPD and possible pneumonia.     Assessment & Plan   Principal Problem:   Acute on chronic respiratory failure with hypoxia (HCC) Active Problems:   COPD with acute exacerbation (HCC)   Pulmonary edema   Persistent atrial fibrillation (HCC)   Acquired hypothyroidism   BMI 60.0-69.9, adult (HCC)   Lymphedema   Shortness of breath   Acute on chronic respiratory failure with hypoxia -at baseline uses 1 L/min, presented with dyspnea and requiring BiPAP, requiring 4 L/min at home continuously for the past couple days.  Chest x-ray with diffuse bilateral opacities which could reflect pulmonary edema or pneumonia.  Procal negative.  D-dimer elevated but can be explained by OSA / OHS and ?pulmonary hypertension.  Degree of hypoxia consistent with her initial chest xray, so low suspicion for PE.  Bilateral LE dopplers negative for DVT's bilaterally.   Pulmonary Edema due to Acute on Chronic Diastolic CHF- suspect pulmonary hypertension contributing.  BNP was mildly elevated. Echo 11/5 - EF 45-50%, normal diastolic function, mild MR.    Suspect possible pulmonary hypertension contributing given underlying OSA (prior CT chest showed enlarged pulmonary a.). Shortness of breath - POA, due to  above.  11/5 - repeat CXR with improved opacities after diuresis 11/6 - improving edema and O2 requirement down to 10 from 15 L/min HFNC, no wheezing 11/7 - edema further improved, on 6 L/min oxygen, no wheezing, put out 3.8 L yesterday 11/8-9 - requiring 6 L/min, high O2 requirement and still 3+ edema with good urine output, diuresing well.  Ongoing DOE. Net IO Since Admission: -8,530 mL [02/17/20 2211]  PLAN: --[facility] cardiology following (pt sees Dr. Lady Gary) --Diltiazem was changed to Toprol (given edema, monitor for bronchospasm) --s/p Zithromax x 5 days --Off steroids --Scheduled DuoNebs every 6 hours while awake --As needed albuterol nebs --Continue formulary equivalents for Trelegy (Breo & Incruse ellipta) --Schedule Mucinex BID --Tylenol as needed fevers   COPD with acute exacerbation - ruled out.  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   Patient does not have diagnosis COPD after further chart review including PFT's and recent pulmonology notes.  She did present with diffuse expiratory wheezing and diminished aeration which has improved with IV steroids.  Has been on steroids outpatient, tapered off recently, still using intermittently when more SOB.  Not clear amiodarone toxicity still a factor.  Pulmonology notes & prior imaging briefly reviewed.   --Close pulm follow up, or consider consult if not improving with diuresis --monitor off steroids, low threshold to resume if wheezing   Persistent atrial fibrillation with RVR -presented with rapid A. fib initial heart rate 131, EKG confirming A. fib with rate of 114 in the ED.  11/6 Changed diltiazem to metoprolol succinate given mildly reduced EF and chronic  edema  11/7-8 - still intermittently tachycardic, mostly with exertion --Continue Toprol XL --Continue Eliquis --Telemetry --Monitor K, Mg  Acquired hypothyroidism -continue levothyroxine.  TSH normal 0.659.  OSA - CPAP ordered  Morbid obesity  - Body mass index is 68.41 kg/m.  Complicates overall care and prognosis.    Lymphedema - appears overall stable.  Diuresis as above.   Prone to lower extremity cellulitis, monitor closely.  Recent lower extremity cellulitis -completed course of Bactrim prior to admission.      DVT prophylaxis:  apixaban (ELIQUIS) tablet 5 mg   Diet:  Diet Orders (From admission, onward)    Start     Ordered   02/12/20 1539  Diet Heart Room service appropriate? Yes; Fluid consistency: Thin  Diet effective now       Question Answer Comment  Room service appropriate? Yes   Fluid consistency: Thin      02/12/20 1543            Code Status: Full Code    Subjective 02/17/20    Pt seen this AM on rounds.  She reports feeling well, shortness of breath improving.  Felt good working with therapy yesterday.  Agreeable with rehab if that remains the recommendation once finishing up diuresis.  Confirms using 1-2 L/min home oxygen at nighttime only.   Disposition Plan & Communication   Status is: Inpatient  Inpatient status remains appropriate due to severity of illness and requirement for IV medications as above.  Ongoing IV diuresis. Weaning oxygen.  Dispo: The patient is from: home              Anticipated d/c is to: home              Anticipated d/c date is: 2-3 days              Medically stable for d/c?: No    Family Communication: none at bedside.  Pt able to update.    Consults, Procedures, Significant Events   Consultants:   None  Procedures:   Echo 02/13/20  Antimicrobials:  Anti-infectives (From admission, onward)   Start     Dose/Rate Route Frequency Ordered Stop   02/12/20 1615  cefTRIAXone (ROCEPHIN) 2 g in sodium chloride 0.9 % 100 mL IVPB  Status:  Discontinued        2 g 200 mL/hr over 30 Minutes Intravenous Every 24 hours 02/12/20 1609 02/12/20 1610   02/12/20 1615  azithromycin (ZITHROMAX) tablet 500 mg  Status:  Discontinued        500 mg Oral Daily 02/12/20  1609 02/12/20 1610   02/12/20 1515  cefTRIAXone (ROCEPHIN) 2 g in sodium chloride 0.9 % 100 mL IVPB  Status:  Discontinued        2 g 200 mL/hr over 30 Minutes Intravenous Every 24 hours 02/12/20 1509 02/13/20 0907   02/12/20 1515  azithromycin (ZITHROMAX) 500 mg in sodium chloride 0.9 % 250 mL IVPB        500 mg 250 mL/hr over 60 Minutes Intravenous Every 24 hours 02/12/20 1509 02/16/20 1522        Objective   Vitals:   02/17/20 1520 02/17/20 1532 02/17/20 1935 02/17/20 2026  BP: (!) 153/80  (!) 109/53   Pulse: 91 97 (!) 108   Resp: 20 18 20    Temp: 98.2 F (36.8 C)  98.5 F (36.9 C)   TempSrc: Oral  Oral   SpO2: 94% 95% 99% 96%  Weight:  Height:        Intake/Output Summary (Last 24 hours) at 02/17/2020 2156 Last data filed at 02/17/2020 2100 Gross per 24 hour  Intake 480 ml  Output 3000 ml  Net -2520 ml   Filed Weights   02/14/20 0618 02/16/20 0453 02/17/20 0421  Weight: (!) 173.3 kg (!) 167.8 kg (!) 165.7 kg    Physical Exam:  General exam: awake, alert, no acute distress, obese Respiratory system: CTAB, normal respiratory effort, remains on 6 L/min oxygen, no wheezing  Cardiovascular system: normal S1/S2, RRR, edema of bilateral LE's as below. Central nervous system: A&O x4. CN II-XII grossly intact, normal speech Extremities: moves all, distal lower extremities with increased skin wrinkling, still with 3+ pitting edema of proximal tibias.  Stable and improved erythema without differential warmth on palpation  Labs   Data Reviewed: I have personally reviewed following labs and imaging studies  CBC: Recent Labs  Lab 02/12/20 1440 02/13/20 0508 02/14/20 0502 02/17/20 0545  WBC 12.6* 9.0 9.9 7.8  NEUTROABS 11.8*  --   --   --   HGB 12.2 11.3* 11.0* 12.5  HCT 39.7 37.0 36.2 40.7  MCV 94.7 93.9 95.5 94.9  PLT 204 210 229 207   Basic Metabolic Panel: Recent Labs  Lab 02/13/20 0508 02/14/20 0502 02/15/20 0510 02/16/20 0342 02/17/20 0545  NA 136  137 139 136 140  K 5.2* 4.6 4.5 3.8 3.7  CL 97* 94* 94* 88* 90*  CO2 29 33* 35* 37* 40*  GLUCOSE 171* 178* 194* 151* 109*  BUN 26* 37* 33* 30* 27*  CREATININE 1.10* 1.10* 0.87 0.97 0.91  CALCIUM 8.8* 9.2 8.6* 8.3* 8.3*  MG  --  2.6*  --   --   --    GFR: Estimated Creatinine Clearance: 101.2 mL/min (by C-G formula based on SCr of 0.91 mg/dL). Liver Function Tests: Recent Labs  Lab 02/12/20 1258  AST 15  ALT 17  ALKPHOS 63  BILITOT 1.5*  PROT 7.3  ALBUMIN 3.5   No results for input(s): LIPASE, AMYLASE in the last 168 hours. No results for input(s): AMMONIA in the last 168 hours. Coagulation Profile: No results for input(s): INR, PROTIME in the last 168 hours. Cardiac Enzymes: No results for input(s): CKTOTAL, CKMB, CKMBINDEX, TROPONINI in the last 168 hours. BNP (last 3 results) No results for input(s): PROBNP in the last 8760 hours. HbA1C: No results for input(s): HGBA1C in the last 72 hours. CBG: No results for input(s): GLUCAP in the last 168 hours. Lipid Profile: No results for input(s): CHOL, HDL, LDLCALC, TRIG, CHOLHDL, LDLDIRECT in the last 72 hours. Thyroid Function Tests: No results for input(s): TSH, T4TOTAL, FREET4, T3FREE, THYROIDAB in the last 72 hours. Anemia Panel: No results for input(s): VITAMINB12, FOLATE, FERRITIN, TIBC, IRON, RETICCTPCT in the last 72 hours. Sepsis Labs: Recent Labs  Lab 02/12/20 1258 02/12/20 1533  PROCALCITON  --  0.10  LATICACIDVEN 1.5  --     Recent Results (from the past 240 hour(s))  Respiratory Panel by RT PCR (Flu A&B, Covid) - Nasopharyngeal Swab     Status: None   Collection Time: 02/12/20 12:58 PM   Specimen: Nasopharyngeal Swab  Result Value Ref Range Status   SARS Coronavirus 2 by RT PCR NEGATIVE NEGATIVE Final    Comment: (NOTE) SARS-CoV-2 target nucleic acids are NOT DETECTED.  The SARS-CoV-2 RNA is generally detectable in upper respiratoy specimens during the acute phase of infection. The  lowest concentration of SARS-CoV-2 viral copies this  assay can detect is 131 copies/mL. A negative result does not preclude SARS-Cov-2 infection and should not be used as the sole basis for treatment or other patient management decisions. A negative result may occur with  improper specimen collection/handling, submission of specimen other than nasopharyngeal swab, presence of viral mutation(s) within the areas targeted by this assay, and inadequate number of viral copies (<131 copies/mL). A negative result must be combined with clinical observations, patient history, and epidemiological information. The expected result is Negative.  Fact Sheet for Patients:  https://www.moore.com/  Fact Sheet for Healthcare Providers:  https://www.young.biz/  This test is no t yet approved or cleared by the [place] FDA and  has been authorized for detection and/or diagnosis of SARS-CoV-2 by FDA under an Emergency Use Authorization (EUA). This EUA will remain  in effect (meaning this test can be used) for the duration of the COVID-19 declaration under Section 564(b)(1) of the Act, 21 U.S.C. section 360bbb-3(b)(1), unless the authorization is terminated or revoked sooner.     Influenza A by PCR NEGATIVE NEGATIVE Final   Influenza B by PCR NEGATIVE NEGATIVE Final    Comment: (NOTE) The Xpert Xpress SARS-CoV-2/FLU/RSV assay is intended as an aid in  the diagnosis of influenza from Nasopharyngeal swab specimens and  should not be used as a sole basis for treatment. Nasal washings and  aspirates are unacceptable for Xpert Xpress SARS-CoV-2/FLU/RSV  testing.  Fact Sheet for Patients: https://www.moore.com/  Fact Sheet for Healthcare Providers: https://www.young.biz/  This test is not yet approved or cleared by the [place] FDA and  has been authorized for detection and/or diagnosis of SARS-CoV-2 by  FDA under  an Emergency Use Authorization (EUA). This EUA will remain  in effect (meaning this test can be used) for the duration of the  Covid-19 declaration under Section 564(b)(1) of the Act, 21  U.S.C. section 360bbb-3(b)(1), unless the authorization is  terminated or revoked. Performed at [facility], [address] [place], [place] [address]   Culture, blood (single)     Status: None   Collection Time: 02/12/20  3:32 PM   Specimen: BLOOD  Result Value Ref Range Status   Specimen Description BLOOD BLOOD LEFT FOREARM  Final   Special Requests   Final    BOTTLES DRAWN AEROBIC AND ANAEROBIC Blood Culture adequate volume   Culture   Final    NO GROWTH 5 DAYS Performed at [facility], [address], [place], [place] [address]    Report Status 02/17/2020 FINAL  Final      Imaging Studies   No results found.   Medications   Scheduled Meds: . apixaban  5 mg Oral BID  . fluticasone furoate-vilanterol  1 puff Inhalation Daily   And  . umeclidinium bromide  1 puff Inhalation Daily  . furosemide  40 mg Intravenous BID  . guaiFENesin  600 mg Oral BID  . [START ON 02/18/2020] influenza vac split quadrivalent PF  0.5 mL Intramuscular Tomorrow-1000  . ipratropium-albuterol  3 mL Nebulization Q6H WA  . levothyroxine  75 mcg Oral Q0600  . metoprolol succinate  75 mg Oral Daily  . sodium chloride flush  10 mL Intravenous Q12H  . cyanocobalamin  2,000 mcg Oral Daily   Continuous Infusions:      LOS: 5 days    Time spent: 25 minutes with > 50% spent in coordination of care and direct patient contact.    Pennie Banter, DO [facility]  02/17/2020, 9:56 PM  If 7PM-7AM, please contact night-coverage. How to contact the [facility] Attending or Consulting provider 7A - 7P or covering provider during after hours 7P -7A, for this patient?    1. Check the care team in [facility] and look for a) attending/consulting TR[facility] provider listed and b) the [facility] team  listed 2. Log into [email or web address] and use [place] universal password to access. If you do not have the password, please contact the hospital operator. 3. Locate the [facility] provider you are looking for under [facility] and page to a number that you can be directly reached. 4. If you still have difficulty reaching the provider, please page the [facility] (Director on Call) for the Hospitalists listed on amion for assistance.

## 2020-02-17 NOTE — [place] FL2 (Signed)
[place] MEDICAID FL2 LEVEL OF CARE SCREENING TOOL     IDENTIFICATION  Patient Name: Katherine Scott Birthdate: 11/06/1960 Sex: female Admission Date (Current Location): 02/12/2020  [place] and [place] Number:  Chiropodist and Address:  [facility], [address] [place], [place] [address]      Provider Number: [id number]  Attending Physician Name and Address:  Pennie Banter, DO  Relative Name and Phone Number:  Cheryll Cockayne [phone]    Current Level of Care: Hospital Recommended Level of Care: Skilled Nursing Facility Prior Approval Number:    Date Approved/Denied:   PASRR Number: [id number]  Discharge Plan: SNF    Current Diagnoses: Patient Active Problem List   Diagnosis Date Noted  . Shortness of breath 02/12/2020  . COPD with acute exacerbation (HCC) 02/12/2020  . Pulmonary edema 02/12/2020  . Venous ulcer (HCC) 10/09/2019  . Acute on chronic respiratory failure with hypoxia (HCC) 03/20/2019  . Lymphedema 02/26/2017  . 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 Acquired hypothyroidism 08/11/2016  . BMI 60.0-69.9, adult (HCC) 08/11/2016  . Persistent atrial fibrillation (HCC) 06/17/2014    Orientation RESPIRATION BLADDER Height & Weight     Self, Time, Situation, Place  O2 Continent Weight: (!) 165.7 kg Height:  5\' 2"  (157.5 cm)  BEHAVIORAL SYMPTOMS/MOOD NEUROLOGICAL BOWEL NUTRITION STATUS      Continent Diet (Regular)  AMBULATORY STATUS COMMUNICATION OF NEEDS Skin   Limited Assist Verbally Normal                       Personal Care Assistance Level of Assistance  Bathing, Dressing Bathing Assistance: Limited assistance   Dressing Assistance: Limited assistance     Functional Limitations Info             SPECIAL CARE FACTORS FREQUENCY  PT (By licensed PT), OT (By licensed OT)     PT Frequency: 5x week OT Frequency: 5x week            Contractures Contractures Info: Not present     Additional Factors Info  Code Status, Allergies Code Status Info: full Allergies Info: Amidarone           Current Medications (02/17/2020):  This is the current hospital active medication list Current Facility-Administered Medications  Medication Dose Route Frequency Provider Last Rate Last Admin  . acetaminophen (TYLENOL) tablet 650 mg  650 mg Oral Q6H PRN 13/12/2019 A, DO       Or  . acetaminophen (TYLENOL) suppository 650 mg  650 mg Rectal Q6H PRN Esaw Grandchild A, DO      . albuterol (PROVENTIL) (2.5 MG/3ML) 0.083% nebulizer solution 2.5 mg  2.5 mg Nebulization Q2H PRN Esaw Grandchild A, DO      . apixaban (ELIQUIS) tablet 5 mg  5 mg Oral BID Esaw Grandchild, NP   5 mg at 02/16/20 2224  . bisacodyl (DULCOLAX) EC tablet 5 mg  5 mg Oral Daily PRN 2225 A, DO      . fluticasone furoate-vilanterol (BREO ELLIPTA) 100-25 MCG/INH 1 puff  1 puff Inhalation Daily Esaw Grandchild, RPH   1 puff at 02/16/20 1033   And  . umeclidinium bromide (INCRUSE ELLIPTA) 62.5 MCG/INH 1 puff  1 puff Inhalation Daily 13/08/21, RPH   1 puff at 02/16/20 1033  . furosemide (LASIX) injection 40 mg  40 mg Intravenous BID 13/08/21 A, DO   40 mg at 02/16/20 1759  .  guaiFENesin (MUCINEX) 12 hr tablet 600 mg  600 mg Oral BID Esaw Grandchild A, DO   600 mg at 02/16/20 2224  . hydrALAZINE (APRESOLINE) tablet 25 mg  25 mg Oral Q8H PRN Esaw Grandchild A, DO      . influenza vac split quadrivalent PF (FLUARIX) injection 0.5 mL  0.5 mL Intramuscular Tomorrow-1000 Esaw Grandchild A, DO      . ipratropium-albuterol (DUONEB) 0.5-2.5 (3) MG/3ML nebulizer solution 3 mL  3 mL Nebulization Q6H PRN Esaw Grandchild A, DO      . levothyroxine (SYNTHROID) tablet 75 mcg  75 mcg Oral Q0600 Manuela Schwartz, NP   75 mcg at 02/17/20 0624  . metoprolol succinate (TOPROL-XL) 24 hr tablet 75 mg  75 mg Oral Daily Esaw Grandchild A, DO   75 mg at 02/16/20 1033  . ondansetron (ZOFRAN) tablet 4 mg  4 mg Oral  Q6H PRN Esaw Grandchild A, DO       Or  . ondansetron (ZOFRAN) injection 4 mg  4 mg Intravenous Q6H PRN Esaw Grandchild A, DO      . polyethylene glycol (MIRALAX / GLYCOLAX) packet 17 g  17 g Oral Daily PRN Esaw Grandchild A, DO      . sodium chloride flush (NS) 0.9 % injection 10 mL  10 mL Intravenous Q12H Esaw Grandchild A, DO   10 mL at 02/16/20 2224  . vitamin B-12 (CYANOCOBALAMIN) tablet 2,000 mcg  2,000 mcg Oral Daily Esaw Grandchild A, DO   2,000 mcg at 02/16/20 1033     Discharge Medications: Please see discharge summary for a list of discharge medications.  Relevant Imaging Results:  Relevant Lab Results:   Additional Information [id number]  Hetty Ely, RN

## 2020-02-18 DIAGNOSIS — E039 Hypothyroidism, unspecified: Secondary | ICD-10-CM

## 2020-02-18 DIAGNOSIS — J849 Interstitial pulmonary disease, unspecified: Secondary | ICD-10-CM

## 2020-02-18 DIAGNOSIS — I5021 Acute systolic (congestive) heart failure: Secondary | ICD-10-CM

## 2020-02-18 DIAGNOSIS — Z6841 Body Mass Index (BMI) 40.0 and over, adult: Secondary | ICD-10-CM

## 2020-02-18 MED ORDER — IPRATROPIUM-ALBUTEROL 0.5-2.5 (3) MG/3ML IN SOLN
3.0000 mL | Freq: Three times a day (TID) | RESPIRATORY_TRACT | Status: DC
  Administered 2020-02-18 – 2020-02-20 (×5): 3 mL via RESPIRATORY_TRACT
  Filled 2020-02-18 (×5): qty 3

## 2020-02-18 NOTE — Plan of Care (Signed)
?  Problem: Education: ?Goal: Ability to demonstrate management of disease process will improve ?Outcome: Progressing ?  ?Problem: Activity: ?Goal: Capacity to carry out activities will improve ?Outcome: Progressing ?  ?Problem: Cardiac: ?Goal: Ability to achieve and maintain adequate cardiopulmonary perfusion will improve ?Outcome: Progressing ?  ?

## 2020-02-18 NOTE — Progress Notes (Signed)
Physical Therapy Treatment Patient Details Name: Katherine Scott MRN: [id number] DOB: Mar 27, 1961 Today's Date: 02/18/2020    History of Present Illness Katherine Scott is a 59yoF who [facility] c SOB adn increased supplemental O2 requirements. PMH inclusive of AF on eloquis, COPD, morbid obesity, lymphedema, OSA on CPAP, CRF on 2L continuous. Pt reports recent fevers, but in ED COVID and Flu tests are (-).    PT Comments    Pt in bed upon entry, agreeable to participate. Pt reports improved mobility, multiple transfers this date and sitting EOB earlier. Pt no down to 4L O2 (baseline is 1L at night c CPAP). Pt performs bed mobility and transfers c modified independence, reports these to feel close to baseline, performance. Pt able to AMB 134ft 2x this date, once on 4L/min, then 6L/min, terminal SpO2: 88% and 94% respectively; HR is in AF 107-124bpm, largely unchanged respective to exertion. Pt reports AMB is still somewhat impaired in tolerance, albeit she typically AMB without supplemental O2 at baseline. Pt reports her balance remains the biggest acute impairment compared to baseline. Will update DC recommendations for services and DME.     Follow Up Recommendations  Supervision for mobility/OOB;Home health PT     Equipment Recommendations  Other (comment) (Bariatric RW (500lb); O2 tubing to support use durign mobility from a stationary concentrator.)    Recommendations for Other Services       Precautions / Restrictions Precautions Precautions: Fall Restrictions Weight Bearing Restrictions: No    Mobility  Bed Mobility Overal bed mobility: Modified Independent                Transfers Overall transfer level: Modified independent Equipment used:  (bari RW) Transfers: Sit to/from Stand              Ambulation/Gait Ambulation/Gait assistance: Supervision;Min guard Gait Distance (Feet): 150 Feet Assistive device:  (bari RW)   Gait velocity: 0.65m/s Gait  velocity interpretation: <1.31 ft/sec, indicative of household ambulator General Gait Details: RW mostly just for confidence and safety to promote cardiorespiratory stimulation.   Stairs             Wheelchair Mobility    Modified Rankin (Stroke Patients Only)       Balance Overall balance assessment: Modified Independent                                          Cognition Arousal/Alertness: Awake/alert Behavior During Therapy: WFL for tasks assessed/performed Overall Cognitive Status: Within Functional Limits for tasks assessed                                        Exercises      General Comments        Pertinent Vitals/Pain Pain Assessment: No/denies pain    Home Living                      Prior Function            PT Goals (current goals can now be found in the care plan section) Acute Rehab PT Goals Patient Stated Goal: move around better and use less oxygen PT Goal Formulation: With patient Time For Goal Achievement: 02/27/20 Potential to Achieve Goals: Good Progress towards PT goals: Progressing toward goals  Frequency    Min 2X/week      PT Plan Discharge plan needs to be updated;Equipment recommendations need to be updated    Co-evaluation              AM-PAC PT "6 Clicks" Mobility   Outcome Measure  Help needed turning from your back to your side while in a flat bed without using bedrails?: None Help needed moving from lying on your back to sitting on the side of a flat bed without using bedrails?: None Help needed moving to and from a bed to a chair (including a wheelchair)?: A Little Help needed standing up from a chair using your arms (e.g., wheelchair or bedside chair)?: A Little Help needed to walk in hospital room?: A Little Help needed climbing 3-5 steps with a railing? : A Lot 6 Click Score: 19    End of Session Equipment Utilized During Treatment: Oxygen;Gait  belt Activity Tolerance: Patient tolerated treatment well;Patient limited by fatigue;No increased pain Patient left: in bed;with call bell/phone within reach Nurse Communication: Mobility status PT Visit Diagnosis: Difficulty in walking, not elsewhere classified (R26.2);Muscle weakness (generalized) (M62.81)     Time: 6962-9528 PT Time Calculation (min) (ACUTE ONLY): 25 min  Charges:  $Therapeutic Exercise: 23-37 mins                     4:23 PM, 02/18/20 Katherine Scott, PT, DPT Physical Therapist - [facility]  [phone] (ASCOM)    Katherine Scott C 02/18/2020, 4:18 PM

## 2020-02-18 NOTE — Progress Notes (Signed)
   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  Normal right ventricular systolic function.  Mild MR. Mild TR   She presented to the ED by way of EMS due to a 2-3 day history of worsening SOB, mild cough and edema.  Patient was seen per request of Dr. Denton Lank for heart failure teaching.  Co morbidities:  Persistent atrial fibrillation COPD OSA compliant with CPAP(2 liter 02) Morbid obesity  Medications:  Eliquis 5 mg BID Lasix 40 mg IV BID Metoprolol succinate 75 mg daily  She has a history of amiodarone toxicity.  Labs:  None obtained today  Weight 164.7 kg (165,7)  Intake 480 ml Output 3200 ml  BMI 66.39 BP 120/79  Assessment:  General- she is awake and alert, no acute distress.  HEENT- pupils are equal, unable to assess for JVD due to body habitus.  Cardiac- heart tones are irregular. No rubs or murmurs  Chest- no wheezes or rhonchi. Clear  Abdomen- rounded soft.  Musculoskeletal- lower legs wrapped in Unna boot, toes are wrinkled and she is able to wiggle them.  Psych is pleasant and appropriate.  Neuro- speech is clear, moves all extremities without difficulty,   Revisited with patient today.  Following up to heart failure teaching to see if she has any questions. She was curious as to how much sodium is in a teaspoon of salt.  Discussed again low sodium diet, eating healthy,   she sates she is going to make a honest effort to continue to eat healthy but states she not going to lie, she has been know to eat  a whole bag of potato chips in one sitting.  By teach back method discussed how she is going to take care of herself daily.  When she is going to notify doctor of any problems.    She was also made aware of follow up in the outpatient heart failure clinic.  She voices understanding, also told her Inetta Fermo NP would be a good resource if she was experiencing any problems.   Will continue to follow  Tresa Endo RN [facility]

## 2020-02-18 NOTE — Progress Notes (Signed)
PROGRESS NOTE  Katherine Scott RCV:[id number] DOB: 25-Nov-1960   PCP: Smitty Cords, DO  Patient is from: Home  DOA: 02/12/2020 LOS: 6  Chief complaints: Shortness of breath, productive cough and low oxygen  Brief Narrative / Interim history: 59 year old female with history of persistent A. fib on Eliquis, COPD, morbid obesity, lymphedema, OSA on CPAP and chronic RF on 2 L brought to ED by EMS due to worsening shortness of breath, productive cough and increased oxygen requirement.  She was admitted with working diagnosis of acute on chronic CHF, COPD exacerbation and possible pneumonia.  Required BiPAP on arrival.  She was a started on IV diuretics with improvement in her breathing.  She has been treated for acute on chronic diastolic CHF with IV diuretics.  Cardiology following.  Subjective: Seen and examined earlier this morning.  No major events overnight of this morning.  No complaints.  Reports improvement in her breathing but still on 6 L by nasal cannula.  She has Unna boot over both legs.  Denies chest pain, palpitation, GI or UTI symptoms.  Objective: Vitals:   02/18/20 0753 02/18/20 0835 02/18/20 1214 02/18/20 1334  BP:  120/79 106/64   Pulse:  87 89   Resp:  16 18   Temp:  98.5 F (36.9 C) 99.5 F (37.5 C)   TempSrc:  Oral Oral   SpO2: 92% 92% 98% 98%  Weight:      Height:        Intake/Output Summary (Last 24 hours) at 02/18/2020 1501 Last data filed at 02/18/2020 1401 Gross per 24 hour  Intake --  Output 3200 ml  Net -3200 ml   Filed Weights   02/16/20 0453 02/17/20 0421 02/18/20 0600  Weight: (!) 167.8 kg (!) 165.7 kg (!) 164.7 kg    Examination:  GENERAL: No apparent distress.  Nontoxic. HEENT: MMM.  Vision and hearing grossly intact.  NECK: Supple.  Difficult to assess JVD due to body habitus. RESP: On 6 L by [place].  No IWOB.  Fair aeration but limited exam due to body habitus. CVS:  RRR. Heart sounds normal.  ABD/GI/GU: BS+. Abd soft, NTND.   MSK/EXT:  Moves extremities. No apparent deformity.  Unna boot over BLE. SKIN: Unna boot over BLE. NEURO: Awake, alert and oriented appropriately.  No apparent focal neuro deficit. PSYCH: Calm. Normal affect.   Procedures:  None  Microbiology summarized: Limited RVP negative.  Assessment & Plan: Acute on chronic respiratory failure with hypoxia likely due to acute systolic CHF-TTE with EF of 45 to 50% but no other significant finding.  She had mildly elevated BNP which could be falsely low given morbid obesity.  CXR concerning for pulmonary edema but improved after diuretics.  Symptoms improved with diuretics.  About 3 L UOP/24 hours.  Net -8.5 L charted.  Renal function and BP stable.  However, patient continues to require 6 L.  [facility] cardiology following-continue IV Lasix 40 mg twice daily -Monitor fluid status and renal functions. -Wean oxygen as able. -Sodium and fluid restrictions  COPD?:  PFT in 05/2019 with reduced FEV1 and FVC but FEV1/FVC ratio of 83% without significant change after bronchodilators arguing against obstructive etiology.  Respiratory symptoms likely due to CHF exacerbation, ILD, OSA and possible OHS.  Initially treated with IV steroid and Zithromax out of concern for possible COPD exacerbation.  -Continue home Trelegy Ellipta with as needed nebulizers. -Wean oxygen as able.  OSA-possible OHS on CPAP-BMI of 66.39. 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 fib with RVR: Rate controlled. -Transition from Cardizem to metoprolol succinate in the setting of reduced EF and chronic edema. -Continue Eliquis -Optimize K and Mg  Acquired hypothyroidism: TSH within normal. -Continue home Synthroid  Lymphedema: Lower extremity Doppler negative for DVT. -Diuretics and Unna boot as above  Elevated D-dimer: Lower extremity venous  Doppler negative.  Low suspicion for PE given improvement in respiratory status with diuretics.  Already on Eliquis for atrial fibrillation  Morbid obesity Body mass index is 66.39 kg/m.  -Encourage lifestyle change to lose weight. -Could benefit from GLP-1 inhibitors and/or bariatric surgery.       DVT prophylaxis:   apixaban (ELIQUIS) tablet 5 mg  Code Status: Full code Family Communication: Patient and/or RN. Available if any question.  Status is: Inpatient  Remains inpatient appropriate because:Unsafe d/c plan, IV treatments appropriate due to intensity of illness or inability to take PO and Inpatient level of care appropriate due to severity of illness   Dispo: The patient is from: Home              Anticipated d/c is to: Home              Anticipated d/c date is: 3 days              Patient currently is not medically stable to d/c.       Consultants:  Cardiology   Sch Meds:  Scheduled Meds: . apixaban  5 mg Oral BID  . fluticasone furoate-vilanterol  1 puff Inhalation Daily   And  . umeclidinium bromide  1 puff Inhalation Daily  . furosemide  40 mg Intravenous BID  . guaiFENesin  600 mg Oral BID  . influenza vac split quadrivalent PF  0.5 mL Intramuscular Tomorrow-1000  . ipratropium-albuterol  3 mL Nebulization Q6H WA  . levothyroxine  75 mcg Oral Q0600  . metoprolol succinate  75 mg Oral Daily  . sodium chloride flush  10 mL Intravenous Q12H  . cyanocobalamin  2,000 mcg Oral Daily   Continuous Infusions: PRN Meds:.acetaminophen **OR** acetaminophen, albuterol, bisacodyl, hydrALAZINE, ondansetron **OR** ondansetron (ZOFRAN) IV, polyethylene glycol  Antimicrobials: Anti-infectives (From admission, onward)   Start     Dose/Rate Route Frequency Ordered Stop   02/12/20 1615  cefTRIAXone (ROCEPHIN) 2 g in sodium chloride 0.9 % 100 mL IVPB  Status:  Discontinued        2 g 200 mL/hr over 30 Minutes Intravenous Every 24 hours 02/12/20 1609 02/12/20 1610    02/12/20 1615  azithromycin (ZITHROMAX) tablet 500 mg  Status:  Discontinued        500 mg Oral Daily 02/12/20 1609 02/12/20 1610   02/12/20 1515  cefTRIAXone (ROCEPHIN) 2 g in sodium chloride 0.9 % 100 mL IVPB  Status:  Discontinued        2 g 200 mL/hr over 30 Minutes Intravenous Every 24 hours 02/12/20 1509 02/13/20 0907   02/12/20 1515  azithromycin (ZITHROMAX) 500 mg in sodium chloride 0.9 % 250 mL IVPB        500 mg 250 mL/hr over 60 Minutes Intravenous Every 24 hours 02/12/20 1509 02/16/20 1522       I have personally reviewed the following labs and images: CBC: Recent Labs  Lab 02/12/20 1440 02/13/20 0508 02/14/20 0502 02/17/20 0545  WBC 12.6* 9.0 9.9 7.8  NEUTROABS 11.8*  --   --   --  HGB 12.2 11.3* 11.0* 12.5  HCT 39.7 37.0 36.2 40.7  MCV 94.7 93.9 95.5 94.9  PLT 204 210 229 207   BMP &GFR Recent Labs  Lab 02/13/20 0508 02/14/20 0502 02/15/20 0510 02/16/20 0342 02/17/20 0545  NA 136 137 139 136 140  K 5.2* 4.6 4.5 3.8 3.7  CL 97* 94* 94* 88* 90*  CO2 29 33* 35* 37* 40*  GLUCOSE 171* 178* 194* 151* 109*  BUN 26* 37* 33* 30* 27*  CREATININE 1.10* 1.10* 0.87 0.97 0.91  CALCIUM 8.8* 9.2 8.6* 8.3* 8.3*  MG  --  2.6*  --   --   --    Estimated Creatinine Clearance: 100.8 mL/min (by C-G formula based on SCr of 0.91 mg/dL). Liver & Pancreas: Recent Labs  Lab 02/12/20 1258  AST 15  ALT 17  ALKPHOS 63  BILITOT 1.5*  PROT 7.3  ALBUMIN 3.5   No results for input(s): LIPASE, AMYLASE in the last 168 hours. No results for input(s): AMMONIA in the last 168 hours. Diabetic: No results for input(s): HGBA1C in the last 72 hours. No results for input(s): GLUCAP in the last 168 hours. Cardiac Enzymes: No results for input(s): CKTOTAL, CKMB, CKMBINDEX, TROPONINI in the last 168 hours. No results for input(s): PROBNP in the last 8760 hours. Coagulation Profile: No results for input(s): INR, PROTIME in the last 168 hours. Thyroid Function Tests: No results for  input(s): TSH, T4TOTAL, FREET4, T3FREE, THYROIDAB in the last 72 hours. Lipid Profile: No results for input(s): CHOL, HDL, LDLCALC, TRIG, CHOLHDL, LDLDIRECT in the last 72 hours. Anemia Panel: No results for input(s): VITAMINB12, FOLATE, FERRITIN, TIBC, IRON, RETICCTPCT in the last 72 hours. Urine analysis:    Component Value Date/Time   COLORURINE Yellow 11/13/2011 1415   APPEARANCEUR Cloudy 11/13/2011 1415   LABSPEC 1.023 11/13/2011 1415   PHURINE 5.0 11/13/2011 1415   GLUCOSEU Negative 11/13/2011 1415   HGBUR Negative 11/13/2011 1415   BILIRUBINUR Negative 11/13/2011 1415   KETONESUR Negative 11/13/2011 1415   PROTEINUR Negative 11/13/2011 1415   NITRITE Negative 11/13/2011 1415   LEUKOCYTESUR Negative 11/13/2011 1415   Sepsis Labs: Invalid input(s): PROCALCITONIN, LACTICIDVEN  Microbiology: Recent Results (from the past 240 hour(s))  Respiratory Panel by RT PCR (Flu A&B, Covid) - Nasopharyngeal Swab     Status: None   Collection Time: 02/12/20 12:58 PM   Specimen: Nasopharyngeal Swab  Result Value Ref Range Status   SARS Coronavirus 2 by RT PCR NEGATIVE NEGATIVE Final    Comment: (NOTE) SARS-CoV-2 target nucleic acids are NOT DETECTED.  The SARS-CoV-2 RNA is generally detectable in upper respiratoy specimens during the acute phase of infection. The lowest concentration of SARS-CoV-2 viral copies this assay can detect is 131 copies/mL. A negative result does not preclude SARS-Cov-2 infection and should not be used as the sole basis for treatment or other patient management decisions. A negative result may occur with  improper specimen collection/handling, submission of specimen other than nasopharyngeal swab, presence of viral mutation(s) within the areas targeted by this assay, and inadequate number of viral copies (<131 copies/mL). A negative result must be combined with clinical observations, patient history, and epidemiological information. The expected result is  Negative.  Fact Sheet for Patients:  https://www.moore.com/  Fact Sheet for Healthcare Providers:  https://www.young.biz/  This test is no t yet approved or cleared by the [place] FDA and  has been authorized for detection and/or diagnosis of SARS-CoV-2 by FDA under an Emergency Use Authorization (EUA). This  EUA will remain  in effect (meaning this test can be used) for the duration of the COVID-19 declaration under Section 564(b)(1) of the Act, 21 U.S.C. section 360bbb-3(b)(1), unless the authorization is terminated or revoked sooner.     Influenza A by PCR NEGATIVE NEGATIVE Final   Influenza B by PCR NEGATIVE NEGATIVE Final    Comment: (NOTE) The Xpert Xpress SARS-CoV-2/FLU/RSV assay is intended as an aid in  the diagnosis of influenza from Nasopharyngeal swab specimens and  should not be used as a sole basis for treatment. Nasal washings and  aspirates are unacceptable for Xpert Xpress SARS-CoV-2/FLU/RSV  testing.  Fact Sheet for Patients: https://www.moore.com/  Fact Sheet for Healthcare Providers: https://www.young.biz/  This test is not yet approved or cleared by the [place] FDA and  has been authorized for detection and/or diagnosis of SARS-CoV-2 by  FDA under an Emergency Use Authorization (EUA). This EUA will remain  in effect (meaning this test can be used) for the duration of the  Covid-19 declaration under Section 564(b)(1) of the Act, 21  U.S.C. section 360bbb-3(b)(1), unless the authorization is  terminated or revoked. Performed at [facility], [address] [place], [place] [address]   Culture, blood (single)     Status: None   Collection Time: 02/12/20  3:32 PM   Specimen: BLOOD  Result Value Ref Range Status   Specimen Description BLOOD BLOOD LEFT FOREARM  Final   Special Requests   Final    BOTTLES DRAWN AEROBIC AND ANAEROBIC Blood Culture adequate  volume   Culture   Final    NO GROWTH 5 DAYS Performed at [facility], [address] [place], [place] [address]    Report Status 02/17/2020 FINAL  Final    Radiology Studies: No results found.    Lovena Kluck T. Syrai Gladwin [facility]  If 7PM-7AM, please contact night-coverage [email or web address] 02/18/2020, 3:01 PM

## 2020-02-18 NOTE — Progress Notes (Signed)
[facility] Cardiology  SUBJECTIVE: Patient laying in bed, reports feeling better, denies chest pain or palpitations   Vitals:   02/17/20 1532 02/17/20 1935 02/17/20 2026 02/18/20 0504  BP:  (!) 109/53  106/61  Pulse: 97 (!) 108  91  Resp: 18 20  20   Temp:  98.5 F (36.9 C)  97.8 F (36.6 C)  TempSrc:  Oral    SpO2: 95% 99% 96% 95%  Weight:      Height:         Intake/Output Summary (Last 24 hours) at 02/18/2020 0801 Last data filed at 02/17/2020 2100 Gross per 24 hour  Intake 480 ml  Output 3000 ml  Net -2520 ml      PHYSICAL EXAM  General: Well developed, well nourished, in no acute distress HEENT:  Normocephalic and atramatic Neck:  No JVD.  Lungs: Clear bilaterally to auscultation and percussion. Heart: HRRR . Normal S1 and S2 without gallops or murmurs.  Abdomen: Bowel sounds are positive, abdomen soft and non-tender  Msk:  Back normal, normal gait. Normal strength and tone for age. Extremities: No clubbing, cyanosis or edema.   Neuro: Alert and oriented X 3. Psych:  Good affect, responds appropriately   LABS: Basic Metabolic Panel: Recent Labs    02/16/20 0342 02/17/20 0545  NA 136 140  K 3.8 3.7  CL 88* 90*  CO2 37* 40*  GLUCOSE 151* 109*  BUN 30* 27*  CREATININE 0.97 0.91  CALCIUM 8.3* 8.3*   Liver Function Tests: No results for input(s): AST, ALT, ALKPHOS, BILITOT, PROT, ALBUMIN in the last 72 hours. No results for input(s): LIPASE, AMYLASE in the last 72 hours. CBC: Recent Labs    02/17/20 0545  WBC 7.8  HGB 12.5  HCT 40.7  MCV 94.9  PLT 207   Cardiac Enzymes: No results for input(s): CKTOTAL, CKMB, CKMBINDEX, TROPONINI in the last 72 hours. BNP: Invalid input(s): POCBNP D-Dimer: No results for input(s): DDIMER in the last 72 hours. Hemoglobin A1C: No results for input(s): HGBA1C in the last 72 hours. Fasting Lipid Panel: No results for input(s): CHOL, HDL, LDLCALC, TRIG, CHOLHDL, LDLDIRECT in the last 72 hours. Thyroid Function  Tests: No results for input(s): TSH, T4TOTAL, T3FREE, THYROIDAB in the last 72 hours.  Invalid input(s): FREET3 Anemia Panel: No results for input(s): VITAMINB12, FOLATE, FERRITIN, TIBC, IRON, RETICCTPCT in the last 72 hours.  No results found.   Echo LVEF 45 to 50%  TELEMETRY: Atrial fibrillation 90 to 100 bpm:  ASSESSMENT AND PLAN:  Principal Problem:   Acute on chronic respiratory failure with hypoxia (HCC) Active Problems:   Persistent atrial fibrillation (HCC)   Acquired hypothyroidism   BMI 60.0-69.9, adult (HCC)   Lymphedema   Shortness of breath   COPD with acute exacerbation (HCC)   Pulmonary edema    1. Persistent atrial fibrillation,at a rate of 90 to 100 bpm, asymptomatic. Patient has a history of atrial fibrillation on Eliquis for stroke prevention. She was on Cardizem CD for rate control which was switched to metoprolol succinate during his hospitalization. Heart rate modestly improved. Patient is asymptomatic, denies chest pain, palpitations or heart racing. She has a history of amiodarone toxicity. She has been considered for catheter ablation of atrial fibrillation has been deferred due to underlying obesity. 2.Acute on chronic diastolic congestive heart failure. Patient has a history of chronic lymphedema. 2D echocardiogram revealed preserved LVEF 45 to 50%. Patient being treated with furosemide 40 mg twice daily with prompt diuresis and overall clinical  improvement. 3.Respiratory failure, likely due to COPD exacerbation, with underlying obstructivesleep apneaon CPAP, improving with bronchodilator, IV antibiotics, steroids, and diuresis  Recommendations  1.  Continue current medications 2.  Continue diuresis 3.  Carefully monitor renal status 4.  Continue Eliquis for stroke prevention 5.  Continue metoprolol succinate for rate control, uptitrate as needed   Marcina Millard, MD, PhD, [facility] 02/18/2020 8:01 AM

## 2020-02-19 LAB — RENAL FUNCTION PANEL
Albumin: 3.1 g/dL — ABNORMAL LOW (ref 3.5–5.0)
Anion gap: 7 (ref 5–15)
BUN: 22 mg/dL — ABNORMAL HIGH (ref 6–20)
CO2: 42 mmol/L — ABNORMAL HIGH (ref 22–32)
Calcium: 8.4 mg/dL — ABNORMAL LOW (ref 8.9–10.3)
Chloride: 89 mmol/L — ABNORMAL LOW (ref 98–111)
Creatinine, Ser: 0.96 mg/dL (ref 0.44–1.00)
GFR, Estimated: >60 mL/min (ref >60)
Glucose, Bld: 105 mg/dL — ABNORMAL HIGH (ref 70–99)
Phosphorus: 3.3 mg/dL (ref 2.5–4.6)
Potassium: 3.2 mmol/L — ABNORMAL LOW (ref 3.5–5.1)
Sodium: 138 mmol/L (ref 135–145)

## 2020-02-19 LAB — MAGNESIUM: Magnesium: 2.2 mg/dL (ref 1.7–2.4)

## 2020-02-19 MED ORDER — POTASSIUM CHLORIDE CRYS ER 20 MEQ PO TBCR
40.0000 meq | EXTENDED_RELEASE_TABLET | ORAL | Status: AC
  Administered 2020-02-19 (×2): 40 meq via ORAL
  Filled 2020-02-19 (×2): qty 2

## 2020-02-19 MED ORDER — FUROSEMIDE 40 MG PO TABS
40.0000 mg | ORAL_TABLET | Freq: Two times a day (BID) | ORAL | Status: DC
  Administered 2020-02-19 – 2020-02-20 (×3): 40 mg via ORAL
  Filled 2020-02-19 (×4): qty 1

## 2020-02-19 NOTE — Plan of Care (Signed)
  Problem: Clinical Measurements: Goal: Respiratory complications will improve Outcome: Progressing Tolerated BiPap while sleeping without complaints or distress.   Problem: Activity: Goal: Risk for activity intolerance will decrease Outcome: Progressing Up to bathroom with standby assistance. No respiratory distress with exertion.

## 2020-02-19 NOTE — Progress Notes (Signed)
[facility] Cardiology  SUBJECTIVE: Patient ambulating in room, without O2, without difficulty, reports feeling better, at baseline clinically   Vitals:   02/18/20 2235 02/19/20 0543 02/19/20 0715 02/19/20 0804  BP:  (!) 91/52  133/84  Pulse:  73  (!) 106  Resp: (!) 21 17    Temp:  98.6 F (37 C)  98.4 F (36.9 C)  TempSrc:  Oral  Oral  SpO2:  95% 95% 93%  Weight:  (!) 162 kg    Height:         Intake/Output Summary (Last 24 hours) at 02/19/2020 0810 Last data filed at 02/18/2020 1758 Gross per 24 hour  Intake --  Output 2350 ml  Net -2350 ml      PHYSICAL EXAM  General: Well developed, well nourished, in no acute distress HEENT:  Normocephalic and atramatic Neck:  No JVD.  Lungs: Clear bilaterally to auscultation and percussion. Heart: HRRR . Normal S1 and S2 without gallops or murmurs.  Abdomen: Bowel sounds are positive, abdomen soft and non-tender  Msk:  Back normal, normal gait. Normal strength and tone for age. Extremities: No clubbing, cyanosis or edema.   Neuro: Alert and oriented X 3. Psych:  Good affect, responds appropriately   LABS: Basic Metabolic Panel: Recent Labs    02/17/20 0545 02/19/20 0454  NA 140 138  K 3.7 3.2*  CL 90* 89*  CO2 40* 42*  GLUCOSE 109* 105*  BUN 27* 22*  CREATININE 0.91 0.96  CALCIUM 8.3* 8.4*  MG  --  2.2  PHOS  --  3.3   Liver Function Tests: Recent Labs    02/19/20 0454  ALBUMIN 3.1*   No results for input(s): LIPASE, AMYLASE in the last 72 hours. CBC: Recent Labs    02/17/20 0545  WBC 7.8  HGB 12.5  HCT 40.7  MCV 94.9  PLT 207   Cardiac Enzymes: No results for input(s): CKTOTAL, CKMB, CKMBINDEX, TROPONINI in the last 72 hours. BNP: Invalid input(s): POCBNP D-Dimer: No results for input(s): DDIMER in the last 72 hours. Hemoglobin A1C: No results for input(s): HGBA1C in the last 72 hours. Fasting Lipid Panel: No results for input(s): CHOL, HDL, LDLCALC, TRIG, CHOLHDL, LDLDIRECT in the last 72  hours. Thyroid Function Tests: No results for input(s): TSH, T4TOTAL, T3FREE, THYROIDAB in the last 72 hours.  Invalid input(s): FREET3 Anemia Panel: No results for input(s): VITAMINB12, FOLATE, FERRITIN, TIBC, IRON, RETICCTPCT in the last 72 hours.  No results found.   Echo LVEF 45 to 50%  TELEMETRY: Atrial fibrillation 90 bpm:  ASSESSMENT AND PLAN:  Principal Problem:   Acute on chronic respiratory failure with hypoxia (HCC) Active Problems:   Persistent atrial fibrillation (HCC)   Acquired hypothyroidism   BMI 60.0-69.9, adult (HCC)   Lymphedema   Shortness of breath   COPD with acute exacerbation (HCC)   Pulmonary edema    1. Persistent atrial fibrillation,at a rate of90 to 100 bpm, asymptomatic. Patient has a history of atrial fibrillation on Eliquis for stroke prevention. She was on Cardizem CD for rate control which was switched to metoprolol succinate during his hospitalization. Heart rate modestly improved. Patient is asymptomatic, denies chest pain, palpitations or heart racing. She has a history of amiodarone toxicity. She has been considered for catheter ablation of atrial fibrillation has been deferred due to underlying obesity. 2.Acute on chronic diastolic congestive heart failure. Patient has a history of chronic lymphedema. 2D echocardiogram revealed preserved LVEF 45 to 50%. Patient being treated with furosemide  40 mg twice daily with prompt diuresis and overall clinical improvement.  Patient appears to be euvolemic. 3.Respiratory failure, likely due to COPD exacerbation, with underlying obstructivesleep apneaon CPAP, improving with bronchodilator, IV antibiotics, steroids, and diuresis  Recommendations  1.  Continue current medications 2.  Continue diuresis, transition to p.o. furosemide 3.  Carefully monitor renal status 4.  Continue Eliquis for stroke prevention 5.  Continue metoprolol succinate for rate control, uptitrate as needed 6.   Patient appears to be nearing discharge either today or in a.m. 7.  Follow-up with Dr. Lady Gary in 1 to 2 weeks   Marcina Millard, MD, PhD, [facility] 02/19/2020 8:10 AM

## 2020-02-19 NOTE — Consult Note (Addendum)
WOC Nurse Consult Note: Reason for Consult: WOC consult requested to apply bilat Una boots.  Pt states she has worn them in the past prior to this admission.  Wound type: Bilat legs with generalized edema; no open wounds or drainage.  Dressing procedure/placement/frequency: Applied bilat Una boots and coban. Topical treatment orders provided for bedside nurses as follows: Leave Una boots in place to BLE; WOC will change Q Thurs while pt is in the hospital.  Pt will need home health assistance after discharge for weekly dressing changes.  Cammie Mcgee MSN, RN, CWOCN, [place], CNS [phone]

## 2020-02-19 NOTE — Progress Notes (Signed)
PROGRESS NOTE  Katherine Scott YHC:[id number] DOB: 1960/09/05   PCP: Smitty Cords, DO  Patient is from: Home  DOA: 02/12/2020 LOS: 7  Chief complaints: Shortness of breath, productive cough and low oxygen  Brief Narrative / Interim history: 59 year old female with history of persistent A. fib on Eliquis, COPD, morbid obesity, lymphedema, OSA on CPAP and chronic RF on 2 L brought to ED by EMS due to worsening shortness of breath, productive cough and increased oxygen requirement.  She was admitted with working diagnosis of acute on chronic CHF, COPD exacerbation and possible pneumonia.  Required BiPAP on arrival.  She was a started on IV diuretics with improvement in her breathing.  She has been treated for acute on chronic diastolic CHF with IV diuretics.  Cardiology following.  Likely home on 02/20/2020 if she does well on p.o. diuretics.  Subjective: Seen and examined earlier this morning.  No major events overnight of this morning.  No complaints.  Breathing improved.  Still requiring 4 L by nasal cannula to maintain appropriate saturation.  Excellent urine output with IV diuretics.  Objective: Vitals:   02/19/20 0543 02/19/20 0715 02/19/20 0804 02/19/20 1127  BP: (!) 91/52  133/84 (!) 117/95  Pulse: 73  (!) 106 (!) 103  Resp: 17   18  Temp: 98.6 F (37 C)  98.4 F (36.9 C) 98.6 F (37 C)  TempSrc: Oral  Oral Oral  SpO2: 95% 95% 93% 95%  Weight: (!) 162 kg     Height:        Intake/Output Summary (Last 24 hours) at 02/19/2020 1328 Last data filed at 02/19/2020 1000 Gross per 24 hour  Intake 480 ml  Output 1650 ml  Net -1170 ml   Filed Weights   02/17/20 0421 02/18/20 0600 02/19/20 0543  Weight: (!) 165.7 kg (!) 164.7 kg (!) 162 kg    Examination:  GENERAL: No apparent distress.  Nontoxic. HEENT: MMM.  Vision and hearing grossly intact.  NECK: Supple.  Difficult to assess JVD due to body habitus. RESP: On 4 L by [place].  No IWOB.  Fair aeration but limited  exam due to body habitus. CVS:  RRR. Heart sounds normal.  ABD/GI/GU: BS+. Abd soft, NTND.  MSK/EXT:  Moves extremities.  Unna boot bilaterally. SKIN: Unna boot bilaterally. NEURO: Awake, alert and oriented appropriately.  No apparent focal neuro deficit. PSYCH: Calm. Normal affect.  Procedures:  None  Microbiology summarized: Limited RVP negative.  Assessment & Plan: Acute on chronic respiratory failure with hypoxia likely due to acute systolic CHF-TTE with EF of 45 to 50% but no other significant finding.  Elevated BNP and vascular congestion on CXR.  Symptoms improved with diuretics.  About 2.4 L UOP/24 hours.  Net -11 L since admission.  Renal function and BP stable.  However, patient requiring 4 L. [facility] cardiology following-transition to p.o. Lasix 40 mg twice daily -Monitor fluid status and renal functions. -Wean oxygen as able. -Sodium and fluid restrictions -Likely home on 02/20/2020 if she does well with p.o. Lasix.  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  Respiratory symptoms likely due to CHF exacerbation, ILD, OSA and possible OHS.  Initially treated with IV steroid and Zithromax out of concern for possible COPD exacerbation.  -Continue home Trelegy Ellipta with as needed nebulizers. -Wean oxygen as able. -Close pulm follow up  Persistent A. fib with RVR: Rate controlled. -Transition from Cardizem to metoprolol succinate in the setting  of reduced EF and chronic edema. -Continue Eliquis -Optimize K and Mg  OSA-possible OHS on CPAP-BMI of 66.39. -Continue nightly CPAP  Acquired hypothyroidism: TSH within normal. -Continue home Synthroid  Lymphedema: Lower extremity Doppler negative for DVT. -Diuretics and Unna boot as above  Elevated D-dimer: Lower extremity venous Doppler negative.  Low suspicion for PE given improvement in respiratory status with diuretics.  Already on Eliquis for atrial  fibrillation  Morbid obesity Body mass index is 65.33 kg/m.  -Encourage lifestyle change to lose weight. -Could benefit from GLP-1 inhibitors and/or bariatric surgery.       DVT prophylaxis:   apixaban (ELIQUIS) tablet 5 mg  Code Status: Full code Family Communication: Patient and/or RN. Available if any question.  Status is: Inpatient  Remains inpatient appropriate because:Unsafe d/c plan and Inpatient level of care appropriate due to severity of illness   Dispo: The patient is from: Home              Anticipated d/c is to: Home              Anticipated d/c date is: 1 day              Patient currently is not medically stable to d/c.       Consultants:  Cardiology   Sch Meds:  Scheduled Meds: . apixaban  5 mg Oral BID  . fluticasone furoate-vilanterol  1 puff Inhalation Daily   And  . umeclidinium bromide  1 puff Inhalation Daily  . furosemide  40 mg Oral BID  . guaiFENesin  600 mg Oral BID  . influenza vac split quadrivalent PF  0.5 mL Intramuscular Tomorrow-1000  . ipratropium-albuterol  3 mL Nebulization TID  . levothyroxine  75 mcg Oral Q0600  . metoprolol succinate  75 mg Oral Daily  . sodium chloride flush  10 mL Intravenous Q12H  . cyanocobalamin  2,000 mcg Oral Daily   Continuous Infusions: PRN Meds:.acetaminophen **OR** acetaminophen, albuterol, bisacodyl, hydrALAZINE, ondansetron **OR** ondansetron (ZOFRAN) IV, polyethylene glycol  Antimicrobials: Anti-infectives (From admission, onward)   Start     Dose/Rate Route Frequency Ordered Stop   02/12/20 1615  cefTRIAXone (ROCEPHIN) 2 g in sodium chloride 0.9 % 100 mL IVPB  Status:  Discontinued        2 g 200 mL/hr over 30 Minutes Intravenous Every 24 hours 02/12/20 1609 02/12/20 1610   02/12/20 1615  azithromycin (ZITHROMAX) tablet 500 mg  Status:  Discontinued        500 mg Oral Daily 02/12/20 1609 02/12/20 1610   02/12/20 1515  cefTRIAXone (ROCEPHIN) 2 g in sodium chloride 0.9 % 100 mL IVPB   Status:  Discontinued        2 g 200 mL/hr over 30 Minutes Intravenous Every 24 hours 02/12/20 1509 02/13/20 0907   02/12/20 1515  azithromycin (ZITHROMAX) 500 mg in sodium chloride 0.9 % 250 mL IVPB        500 mg 250 mL/hr over 60 Minutes Intravenous Every 24 hours 02/12/20 1509 02/16/20 1522       I have personally reviewed the following labs and images: CBC: Recent Labs  Lab 02/12/20 1440 02/13/20 0508 02/14/20 0502 02/17/20 0545  WBC 12.6* 9.0 9.9 7.8  NEUTROABS 11.8*  --   --   --   HGB 12.2 11.3* 11.0* 12.5  HCT 39.7 37.0 36.2 40.7  MCV 94.7 93.9 95.5 94.9  PLT 204 210 229 207   BMP &GFR Recent Labs  Lab 02/14/20 0502  02/15/20 0510 02/16/20 0342 02/17/20 0545 02/19/20 0454  NA 137 139 136 140 138  K 4.6 4.5 3.8 3.7 3.2*  CL 94* 94* 88* 90* 89*  CO2 33* 35* 37* 40* 42*  GLUCOSE 178* 194* 151* 109* 105*  BUN 37* 33* 30* 27* 22*  CREATININE 1.10* 0.87 0.97 0.91 0.96  CALCIUM 9.2 8.6* 8.3* 8.3* 8.4*  MG 2.6*  --   --   --  2.2  PHOS  --   --   --   --  3.3   Estimated Creatinine Clearance: 94.5 mL/min (by C-G formula based on SCr of 0.96 mg/dL). Liver & Pancreas: Recent Labs  Lab 02/19/20 0454  ALBUMIN 3.1*   No results for input(s): LIPASE, AMYLASE in the last 168 hours. No results for input(s): AMMONIA in the last 168 hours. Diabetic: No results for input(s): HGBA1C in the last 72 hours. No results for input(s): GLUCAP in the last 168 hours. Cardiac Enzymes: No results for input(s): CKTOTAL, CKMB, CKMBINDEX, TROPONINI in the last 168 hours. No results for input(s): PROBNP in the last 8760 hours. Coagulation Profile: No results for input(s): INR, PROTIME in the last 168 hours. Thyroid Function Tests: No results for input(s): TSH, T4TOTAL, FREET4, T3FREE, THYROIDAB in the last 72 hours. Lipid Profile: No results for input(s): CHOL, HDL, LDLCALC, TRIG, CHOLHDL, LDLDIRECT in the last 72 hours. Anemia Panel: No results for input(s): VITAMINB12, FOLATE,  FERRITIN, TIBC, IRON, RETICCTPCT in the last 72 hours. Urine analysis:    Component Value Date/Time   COLORURINE Yellow 11/13/2011 1415   APPEARANCEUR Cloudy 11/13/2011 1415   LABSPEC 1.023 11/13/2011 1415   PHURINE 5.0 11/13/2011 1415   GLUCOSEU Negative 11/13/2011 1415   HGBUR Negative 11/13/2011 1415   BILIRUBINUR Negative 11/13/2011 1415   KETONESUR Negative 11/13/2011 1415   PROTEINUR Negative 11/13/2011 1415   NITRITE Negative 11/13/2011 1415   LEUKOCYTESUR Negative 11/13/2011 1415   Sepsis Labs: Invalid input(s): PROCALCITONIN, LACTICIDVEN  Microbiology: Recent Results (from the past 240 hour(s))  Respiratory Panel by RT PCR (Flu A&B, Covid) - Nasopharyngeal Swab     Status: None   Collection Time: 02/12/20 12:58 PM   Specimen: Nasopharyngeal Swab  Result Value Ref Range Status   SARS Coronavirus 2 by RT PCR NEGATIVE NEGATIVE Final    Comment: (NOTE) SARS-CoV-2 target nucleic acids are NOT DETECTED.  The SARS-CoV-2 RNA is generally detectable in upper respiratoy specimens during the acute phase of infection. The lowest concentration of SARS-CoV-2 viral copies this assay can detect is 131 copies/mL. A negative result does not preclude SARS-Cov-2 infection and should not be used as the sole basis for treatment or other patient management decisions. A negative result may occur with  improper specimen collection/handling, submission of specimen other than nasopharyngeal swab, presence of viral mutation(s) within the areas targeted by this assay, and inadequate number of viral copies (<131 copies/mL). A negative result must be combined with clinical observations, patient history, and epidemiological information. The expected result is Negative.  Fact Sheet for Patients:  https://www.moore.com/https://www.fda.gov/media/142436/download  Fact Sheet for Healthcare Providers:  https://www.young.biz/https://www.fda.gov/media/142435/download  This test is no t yet approved or cleared by the [place] FDA and   has been authorized for detection and/or diagnosis of SARS-CoV-2 by FDA under an Emergency Use Authorization (EUA). This EUA will remain  in effect (meaning this test can be used) for the duration of the COVID-19 declaration under Section 564(b)(1) of the Act, 21 U.S.C. section 360bbb-3(b)(1), unless the authorization is terminated or  revoked sooner.     Influenza A by PCR NEGATIVE NEGATIVE Final   Influenza B by PCR NEGATIVE NEGATIVE Final    Comment: (NOTE) The Xpert Xpress SARS-CoV-2/FLU/RSV assay is intended as an aid in  the diagnosis of influenza from Nasopharyngeal swab specimens and  should not be used as a sole basis for treatment. Nasal washings and  aspirates are unacceptable for Xpert Xpress SARS-CoV-2/FLU/RSV  testing.  Fact Sheet for Patients: https://www.moore.com/  Fact Sheet for Healthcare Providers: https://www.young.biz/  This test is not yet approved or cleared by the [place] FDA and  has been authorized for detection and/or diagnosis of SARS-CoV-2 by  FDA under an Emergency Use Authorization (EUA). This EUA will remain  in effect (meaning this test can be used) for the duration of the  Covid-19 declaration under Section 564(b)(1) of the Act, 21  U.S.C. section 360bbb-3(b)(1), unless the authorization is  terminated or revoked. Performed at [facility], [address] [place], [place] [address]   Culture, blood (single)     Status: None   Collection Time: 02/12/20  3:32 PM   Specimen: BLOOD  Result Value Ref Range Status   Specimen Description BLOOD BLOOD LEFT FOREARM  Final   Special Requests   Final    BOTTLES DRAWN AEROBIC AND ANAEROBIC Blood Culture adequate volume   Culture   Final    NO GROWTH 5 DAYS Performed at [facility], [address] [place], [place] [address]    Report Status 02/17/2020 FINAL  Final    Radiology Studies: No results found.    Chanler Schreiter T. Alayha Babineaux [facility]ist  If 7PM-7AM, please contact night-coverage [email or web address] 02/19/2020, 1:28 PM

## 2020-02-19 NOTE — Progress Notes (Signed)
   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  She states she is excited as she has lost 25 pounds since admission, and could see her ankles.  She could not remember the last time that happened.  She states she does not have any questions at this time.  She knows what she needs to do to eat more healthy.  She says she has plenty of frozen vegetables and lean meets in her freezer, but always went for the convenience foods and feels that will have to change.   Tresa Endo RN, CHFN

## 2020-02-20 DIAGNOSIS — I5023 Acute on chronic systolic (congestive) heart failure: Secondary | ICD-10-CM

## 2020-02-20 LAB — RENAL FUNCTION PANEL
Albumin: 3 g/dL — ABNORMAL LOW (ref 3.5–5.0)
Anion gap: 9 (ref 5–15)
BUN: 19 mg/dL (ref 6–20)
CO2: 39 mmol/L — ABNORMAL HIGH (ref 22–32)
Calcium: 8.6 mg/dL — ABNORMAL LOW (ref 8.9–10.3)
Chloride: 92 mmol/L — ABNORMAL LOW (ref 98–111)
Creatinine, Ser: 0.88 mg/dL (ref 0.44–1.00)
GFR, Estimated: >60 mL/min (ref >60)
Glucose, Bld: 108 mg/dL — ABNORMAL HIGH (ref 70–99)
Phosphorus: 3.5 mg/dL (ref 2.5–4.6)
Potassium: 3.7 mmol/L (ref 3.5–5.1)
Sodium: 140 mmol/L (ref 135–145)

## 2020-02-20 LAB — CBC
HCT: 36.9 % (ref 36.0–46.0)
Hemoglobin: 11.5 g/dL — ABNORMAL LOW (ref 12.0–15.0)
MCH: 29.5 pg (ref 26.0–34.0)
MCHC: 31.2 g/dL (ref 30.0–36.0)
MCV: 94.6 fL (ref 80.0–100.0)
Platelets: 172 10*3/uL (ref 150–400)
RBC: 3.9 MIL/uL (ref 3.87–5.11)
RDW: 15.5 % (ref 11.5–15.5)
WBC: 8.1 10*3/uL (ref 4.0–10.5)
nRBC: 0 % (ref 0.0–0.2)

## 2020-02-20 LAB — MAGNESIUM: Magnesium: 2.2 mg/dL (ref 1.7–2.4)

## 2020-02-20 MED ORDER — METOPROLOL SUCCINATE ER 25 MG PO TB24
75.0000 mg | ORAL_TABLET | Freq: Every day | ORAL | 1 refills | Status: DC
Start: 2020-02-20 — End: 2021-09-02

## 2020-02-20 MED ORDER — FUROSEMIDE 40 MG PO TABS
40.0000 mg | ORAL_TABLET | Freq: Two times a day (BID) | ORAL | 1 refills | Status: DC
Start: 2020-02-20 — End: 2020-05-28

## 2020-02-20 NOTE — Progress Notes (Signed)
[facility] Cardiology  Patient Description: [name] is a 59 year old female with PMH signifcant for persistent atrial fibrillation (on eliquis), h/o amiodarone toxicity, chronic diastolic CHF, COPD, OSA (on CPAP), chronic hypoxemic respiratory failure (on 2L of O2 via [place]), chronic lymphedema and morbid obesity who was admitted to [facility] hospital for acute on chronic respiratory failure with hypoxia likely secondary to COPD with acute exacerbation and acute on chronic diastolic CHF.    SUBJECTIVE: The patient reports to be doing well and states that she is ready for discharge. She reports that her edema has improved significantly during her inpatient stay, furthermore, her dyspnea has improved and she is back to her baseline oxygen therapy at 2L via  as needed. The patient states "I have not seen my ankles this small in years." She reports that she lives at home alone, but has a great support system.   OBJECTIVE: The patient appears well, appears euvolemic and has normal effort of breathing. Telemetry monitor reveals atrial fibrillation with HR in the 90's bpm. The patient is sitting up in bed resting comfortably and her BLE are wrapping with a compression dressing.   Vitals:   02/20/20 0732 02/20/20 0743 02/20/20 0830 02/20/20 1125  BP: 133/73   126/78  Pulse: 76   93  Resp: 17   17  Temp: 98.3 F (36.8 C)   98.3 F (36.8 C)  TempSrc: Oral   Oral  SpO2: 92% 92%  94%  Weight:   (!) 161.3 kg   Height:         Intake/Output Summary (Last 24 hours) at 02/20/2020 1143 Last data filed at 02/20/2020 1127 Gross per 24 hour  Intake 1520 ml  Output 3675 ml  Net -2155 ml      PHYSICAL EXAM  General: Well developed, well nourished, in no acute distress HEENT:  Normocephalic and atramatic Neck:  No JVD. Supple.  Lungs: Clear bilaterally to auscultation. Chest expansion symmetrical, normal effort of breathing.  Heart: HRRR . Normal S1 and S2 without gallops or murmurs.  Abdomen: Bowel sounds are  positive, abdomen soft and non-tender, obese  Msk: Normal strength and tone for age. Extremities: mild lymphedema to BLE. No clubbing or cyanosis..   Neuro: Alert and oriented X 3. Psych:  Good affect, responds appropriately   LABS: Basic Metabolic Panel: Recent Labs    02/19/20 0454 02/20/20 0521  NA 138 140  K 3.2* 3.7  CL 89* 92*  CO2 42* 39*  GLUCOSE 105* 108*  BUN 22* 19  CREATININE 0.96 0.88  CALCIUM 8.4* 8.6*  MG 2.2 2.2  PHOS 3.3 3.5   Liver Function Tests: Recent Labs    02/19/20 0454 02/20/20 0521  ALBUMIN 3.1* 3.0*   No results for input(s): LIPASE, AMYLASE in the last 72 hours. CBC: Recent Labs    02/20/20 0521  WBC 8.1  HGB 11.5*  HCT 36.9  MCV 94.6  PLT 172   Cardiac Enzymes: No results for input(s): CKTOTAL, CKMB, CKMBINDEX, TROPONINI in the last 72 hours. BNP: Invalid input(s): POCBNP D-Dimer: No results for input(s): DDIMER in the last 72 hours. Hemoglobin A1C: No results for input(s): HGBA1C in the last 72 hours. Fasting Lipid Panel: No results for input(s): CHOL, HDL, LDLCALC, TRIG, CHOLHDL, LDLDIRECT in the last 72 hours. Thyroid Function Tests: No results for input(s): TSH, T4TOTAL, T3FREE, THYROIDAB in the last 72 hours.  Invalid input(s): FREET3 Anemia Panel: No results for input(s): VITAMINB12, FOLATE, FERRITIN, TIBC, IRON, RETICCTPCT in the last 72 hours.  No results found.   Echo: IMPRESSIONS  1. Left ventricular ejection fraction, by estimation, is 45 to 50%. The  left ventricle has mildly decreased function. The left ventricle has no  regional wall motion abnormalities. Left ventricular diastolic parameters  were normal.  2. Right ventricular systolic function is normal. The right ventricular  size is normal.  3. The mitral valve is normal in structure. Mild mitral valve  regurgitation. No evidence of mitral stenosis.  4. The aortic valve is normal in structure. Aortic valve regurgitation is  not visualized. No  aortic stenosis is present.  5. The inferior vena cava is normal in size with greater than 50%  respiratory variability, suggesting right atrial pressure of 3 mmHg.   TELEMETRY: atrial fibrillation with HR in the 90's.   ASSESSMENT AND PLAN:  Principal Problem:   Acute on chronic respiratory failure with hypoxia (HCC) Active Problems:   Persistent atrial fibrillation (HCC)   Acquired hypothyroidism   BMI 60.0-69.9, adult (HCC)   Lymphedema   Shortness of breath   COPD with acute exacerbation (HCC)   Pulmonary edema    1. Persistent Atrial fibrillation, reasonably stable, rate controlled, patient remains asymptomatic  -Continue metoprolol succinate for rate control and anticoagulation with eliquis to decreased the risk of CVA.    -Patient was previously evaluated for ablation but deferred due to underlying morbid obesity.   -Continue telemetry monitoring until discharge.   -The patient is cleared for discharge from a Cardiology stand point. The patient should follow up with Dr. Iantha Fallen Fath/Nicole Stephens-PA-C as an outpatient within 10 days of discharge.   2. Acute on Chronic CHF, reasonably stable  -Echocardiogram revealed LVEF estimated at 45-50% and normal diastolic parameters.   -Continue oral diuresis with Lasix.   -Recommend follow up with [facility] HF clinic in addition to following up with Dr. Lady Gary.   -Continue low sodium diet, utilizing compression wraps for the BLE and keeping the BLE elevated.   3. Acute on Chronic respiratory failure with hypoxia, reasonably stable  -Recommend continuing current bronchodilator therapy, supplemental oxygen and CPAP use while asleep.   -Recommend follow up with Pulmonary within 1-2 weeks as an outpatient.   4. Hypothyroidism, reasonably stable  -Recommend continuing levothyroxine therapy.   5. Morbid obesity  -Recommend modest weight loss, portion control, following a heart healthy diet and performing daily aerobic exercise for at least  30 minutes as tolerated.    Rasheka Denard, ACNPC-AG  02/20/2020 11:43 AM

## 2020-02-20 NOTE — Plan of Care (Signed)
  Problem: Education: Goal: Knowledge of General Education information will improve Description: Including pain rating scale, medication(s)/side effects and non-pharmacologic comfort measures Outcome: Adequate for Discharge   Problem: Health Behavior/Discharge Planning: Goal: Ability to manage health-related needs will improve Outcome: Adequate for Discharge   Problem: Clinical Measurements: Goal: Ability to maintain clinical measurements within normal limits will improve Outcome: Adequate for Discharge Goal: Will remain free from infection Outcome: Adequate for Discharge Goal: Diagnostic test results will improve Outcome: Adequate for Discharge Goal: Respiratory complications will improve Outcome: Adequate for Discharge Goal: Cardiovascular complication will be avoided Outcome: Adequate for Discharge   Problem: Activity: Goal: Risk for activity intolerance will decrease Outcome: Adequate for Discharge   Problem: Nutrition: Goal: Adequate nutrition will be maintained Outcome: Adequate for Discharge   Problem: Coping: Goal: Level of anxiety will decrease Outcome: Adequate for Discharge   Problem: Elimination: Goal: Will not experience complications related to bowel motility Outcome: Adequate for Discharge Goal: Will not experience complications related to urinary retention Outcome: Adequate for Discharge   Problem: Safety: Goal: Ability to remain free from injury will improve Outcome: Adequate for Discharge   Problem: Skin Integrity: Goal: Risk for impaired skin integrity will decrease Outcome: Adequate for Discharge   Problem: Education: Goal: Ability to demonstrate management of disease process will improve Outcome: Adequate for Discharge Goal: Ability to verbalize understanding of medication therapies will improve Outcome: Adequate for Discharge Goal: Individualized Educational Video(s) Outcome: Adequate for Discharge   Problem: Activity: Goal: Capacity to  carry out activities will improve Outcome: Adequate for Discharge   Problem: Cardiac: Goal: Ability to achieve and maintain adequate cardiopulmonary perfusion will improve Outcome: Adequate for Discharge   

## 2020-02-20 NOTE — Discharge Summary (Signed)
Physician Discharge Summary  [name]:[id number] DOB: 23-Jul-1960 DOA: 02/12/2020  PCP: Smitty Cords, DO  Admit date: 02/12/2020 Discharge date: 02/20/2020  Admitted From: Home Disposition: Home  Recommendations for Outpatient Follow-up:  1. Follow ups as below. 2. Please obtain CBC/BMP/Mag at follow up 3. Please follow up on the following pending results: None  Home Health: PT/OT/RN/aide Equipment/Devices: Bariatric walker and oxygen extension cord  Discharge Condition: Stable CODE STATUS: Full code   Follow-up Information    [facility] Follow up on 02/23/2020.   Specialty: Cardiology Why: at 2:30pm. Enter through the [facility] entrance Contact information: [address] [place] [address] [phone]       Smitty Cords, DO. Schedule an appointment as soon as possible for a visit on 02/24/2020.   Specialty: Family Medicine Why: @ 1:20pm Contact information: [address] [place] [address] [phone]                Hospital Course: 59 year old female with history of persistent A. fib on Eliquis, COPD, morbid obesity, lymphedema, OSA on CPAP and chronic RF on 2 L brought to ED by EMS due to worsening shortness of breath, productive cough and increased oxygen requirement.  She was admitted with working diagnosis of acute on chronic CHF, COPD exacerbation and possible pneumonia.  Required BiPAP on arrival.  She was a started on IV diuretics with improvement in her breathing.    She has been treated for acute on chronic diastolic CHF with IV diuretics and transition to p.o. Lasix 40 mg twice daily continue to diurese well. Also had Unna boot applied for bilateral lower extremity edema.  She had net negative 12 L during this hospitalization.  Weight down from 382 pounds to 355 pounds.  Patient was evaluated by therapy who recommended home health.  Wound care  consulted and recommended Unna boot change at least weekly.  Ambulatory referral to vein vascular specialist ordered on discharge.  Patient was counseled on daily weight log, sodium and fluid restriction.  She could benefit from SGLT2 inhibitors or GLP-1 inhibitors in the future.  See individual problem list below for more on hospital course.  Discharge Diagnoses:  Acute on chronic respiratory failure with hypoxia likely due to acute systolic CHF-TTE with EF of 45 to 50% but no other significant finding.  Elevated BNP and vascular congestion on CXR.  Symptoms improved with diuretics.   Had 2 L urine output on p.o. Lasix in the last 24 hours.   Net -12 L.  Weight down from 382 to 355 pounds.  Oxygen requirement improved. -Discharged on p.o. Lasix 40 mg twice daily per cardiology recommendation. -GDMT-metoprolol XL 75 mg daily. 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  Respiratory symptoms likely due to CHF exacerbation, ILD, OSA and possible OHS.  Initially treated with IV steroid and Zithromax out of concern for possible COPD exacerbation.  -Continue home prednisone, Trelegy Ellipta with as needed nebulizers. -Follow-up with pulmonology. -Continue home oxygen-currently requiring 3 L.  Persistent A. fib with RVR: Rate controlled. -Cardizem discontinued. -Rate controlled on metoprolol XL 75 mg daily. -Continue Eliquis  OSA-possible OHS on CPAP-BMI of 66.39. -Continue nightly CPAP  Acquired hypothyroidism: TSH within normal. -Continue home Synthroid  Lymphedema: Lower extremity Doppler negative for DVT. -Diuretics as above. -Bilateral lower  extremity Unna boots -Home health RN for change 1-2 times a week. -Ambulatory referral to vein and vascular specialist  Elevated D-dimer: Lower extremity venous Doppler negative.  Low suspicion  for PE given improvement in respiratory status with diuretics.  Already on Eliquis for atrial fibrillation  Morbid obesity Body mass index is 65.02 kg/m.  -Could benefit from GLP-1 inhibitors          Discharge Exam: Vitals:   02/20/20 0743 02/20/20 1125  BP:  126/78  Pulse:  93  Resp:  17  Temp:  98.3 F (36.8 C)  SpO2: 92% 94%    GENERAL: No apparent distress.  Nontoxic. HEENT: MMM.  Vision and hearing grossly intact.  NECK: Supple.  Difficult to assess JVD due to body habitus. RESP: On 3 L.  No IWOB.  Fair aeration bilaterally. CVS:  RRR. Heart sounds normal.  ABD/GI/GU: Bowel sounds present. Soft. Non tender.  MSK/EXT:  Moves extremities. No apparent deformity.  Unna boot over BLE. SKIN: Unna boot over BLE. NEURO: Awake, alert and oriented appropriately.  No apparent focal neuro deficit. PSYCH: Calm. Normal affect.   Discharge Instructions  Discharge Instructions    (HEART FAILURE PATIENTS) Call MD:  Anytime you have any of the following symptoms: 1) 3 pound weight gain in 24 hours or 5 pounds in 1 week 2) shortness of breath, with or without a dry hacking cough 3) swelling in the hands, feet or stomach 4) if you have to sleep on extra pillows at night in order to breathe.   Complete by: As directed    Ambulatory referral to Vascular Surgery   Complete by: As directed    Lymphedema and chronic venous insufficiency   Call MD for:  difficulty breathing, headache or visual disturbances   Complete by: As directed    Call MD for:  extreme fatigue   Complete by: As directed    Call MD for:  persistant dizziness or light-headedness   Complete by: As directed    Diet - low sodium heart healthy   Complete by: As directed    Discharge instructions   Complete by: As directed    It has been a pleasure taking care of you!  You were hospitalized and treated for CHF exacerbation.  Your symptoms improved to the point we think it is safe to let you go home and follow-up with  your primary care doctor and cardiologist in 1 to 2 weeks.  Please review your new medication list and the directions on your medications before you take them.  In addition to taking your medications as prescribed, avoid alcohol or over-the-counter pain medication other than plain Tylenol, limit the amount of water/fluid you drink to less than 6 cups (1500 cc) a day,  limit your sodium (salt) intake to less than 2 g (2000 mg) a day and weigh yourself daily at the same time and keeping your weight log.      Take care,   Discharge wound care:   Complete by: As directed    Dressing procedure/placement/frequency: Applied bilat Una boots and coban. Topical treatment orders provided for bedside nurses as follows: Leave Una boots in place to BLE; change the [employer] twice a week.   Increase activity slowly   Complete by: As directed      Allergies as of 02/20/2020      Reactions   Amiodarone    Caused toxicity       Medication List    STOP taking these  medications   amLODipine 5 MG tablet Commonly known as: NORVASC   diltiazem 240 MG 24 hr capsule Commonly known as: CARDIZEM CD   diltiazem 240 MG 24 hr capsule Commonly known as: TIAZAC   sulfamethoxazole-trimethoprim 800-160 MG tablet Commonly known as: BACTRIM DS   traMADol 50 MG tablet Commonly known as: ULTRAM     TAKE these medications   albuterol 108 (90 Base) MCG/ACT inhaler Commonly known as: VENTOLIN HFA Inhale 2 puffs into the lungs every 6 (six) hours as needed for wheezing.   cyanocobalamin 2000 MCG tablet Take 2,000 mcg by mouth daily.   Eliquis 5 MG Tabs tablet Generic drug: apixaban Take 5 mg by mouth 2 (two) times daily.   furosemide 40 MG tablet Commonly known as: LASIX Take 1 tablet (40 mg total) by mouth 2 (two) times daily. What changed: when to take this   levothyroxine 75 MCG tablet Commonly known as: SYNTHROID TAKE 1 TABLET BY MOUTH DAILY BEFORE BREAKFAST. WILL NEED THYROID LAB & FOLLOW-UP FOR  FURTHER REFILLS What changed: See the new instructions.   metoprolol succinate 25 MG 24 hr tablet Commonly known as: TOPROL-XL Take 3 tablets (75 mg total) by mouth daily.   OXYGEN Inhale 2 L/L into the lungs as needed.   potassium chloride SA 20 MEQ tablet Commonly known as: KLOR-CON Take 1 tablet (20 mEq total) by mouth daily. What changed: when to take this   predniSONE 10 MG tablet Commonly known as: DELTASONE TAKE 1 TABLET (10 MG TOTAL) BY MOUTH DAILY WITH BREAKFAST.   Spacer/Aero-Holding [employer] 1 Device by Does not apply route as needed.   Trelegy Ellipta 100-62.5-25 MCG/INH Aepb Generic drug: Fluticasone-Umeclidin-Vilant Inhale 1 Inhaler into the lungs daily.            Durable Medical Equipment  (From admission, onward)         Start     Ordered   02/20/20 1005  For home use only DME Walker rolling  Once       Comments: Bariatric Walker  Question Answer Comment  Walker: With 5 Inch Wheels   Patient needs a walker to treat with the following condition Weakness generalized      02/20/20 1005   02/20/20 0811  For home use only DME Other see comment  Once       Comments: Oxygen tubing  Question:  Length of Need  Answer:  Lifetime   02/20/20 0810   02/20/20 0810  For home use only DME Other see comment  Once       Comments: Bariatric rolling walker  Question:  Length of Need  Answer:  Lifetime   02/20/20 0810           Discharge Care Instructions  (From admission, onward)         Start     Ordered   02/20/20 0000  Discharge wound care:       Comments: Dressing procedure/placement/frequency: Applied bilat Una boots and coban. Topical treatment orders provided for bedside nurses as follows: Leave Una boots in place to BLE; change the [employer] twice a week.   02/20/20 0998          Consultations:  Cardiology  Procedures/Studies: 2D Echo on 02/13/2020  1. Left ventricular ejection fraction, by estimation, is 45 to 50%. The  left  ventricle has mildly decreased function. The left ventricle has no  regional wall motion abnormalities. Left ventricular diastolic parameters  were normal.  2. Right ventricular  systolic function is normal. The right ventricular  size is normal.  3. The mitral valve is normal in structure. Mild mitral valve  regurgitation. No evidence of mitral stenosis.  4. The aortic valve is normal in structure. Aortic valve regurgitation is  not visualized. No aortic stenosis is present.  5. The inferior vena cava is normal in size with greater than 50%  respiratory variability, suggesting right atrial pressure of 3 mmHg.   US Venous Img Lower Bilateral (DVT)  Result Date: 02/13/2020 CLINICAL DATA:  Bilateral lower extremity pain and edema. History of cellulitis affecting the lower legs the past several months. Evaluate for DVT. EXAM: BILATERAL LOWER EXTREMITY VENOUS DOPPLER ULTRASOUND TECHNIQUE: Gray-scale sonography with graded compression, as well as color Doppler and duplex ultrasound were performed to evaluate the lower extremity deep venous systems from the level of the common femoral vein and including the common femoral, femoral, profunda femoral, popliteal and calf veins including the posterior tibial, peroneal and gastrocnemius veins when visible. The superficial great saphenous vein was also interrogated. Spectral Doppler was utilized to evaluate flow at rest and with distal augmentation maneuvers in the common femoral, femoral and popliteal veins. COMPARISON:  None. FINDINGS: Examination is degraded due to patient body habitus and poor sonographic window. RIGHT LOWER EXTREMITY Common Femoral Vein: No evidence of thrombus. Normal compressibility, respiratory phasicity and response to augmentation. Saphenofemoral Junction: No evidence of thrombus. Normal compressibility and flow on color Doppler imaging. Profunda Femoral Vein: No evidence of thrombus. Normal compressibility and flow on color Doppler  imaging. Femoral Vein: No evidence of thrombus. Normal compressibility, respiratory phasicity and response to augmentation. Popliteal Vein: No evidence of thrombus. Normal compressibility, respiratory phasicity and response to augmentation. Calf Veins: No evidence of thrombus. Normal compressibility and flow on color Doppler imaging. Superficial Great Saphenous Vein: No evidence of thrombus. Normal compressibility. Venous Reflux:  None. Other Findings: There is a minimal amount of subcutaneous edema the level of the right calf (image 33). LEFT LOWER EXTREMITY Common Femoral Vein: No evidence of thrombus. Normal compressibility, respiratory phasicity and response to augmentation. Saphenofemoral Junction: No evidence of thrombus. Normal compressibility and flow on color Doppler imaging. Profunda Femoral Vein: No evidence of thrombus. Normal compressibility and flow on color Doppler imaging. Femoral Vein: No evidence of thrombus. Normal compressibility, respiratory phasicity and response to augmentation. Popliteal Vein: No evidence of thrombus. Normal compressibility, respiratory phasicity and response to augmentation. Calf Veins: No evidence of thrombus. Normal compressibility and flow on color Doppler imaging. Superficial Great Saphenous Vein: No evidence of thrombus. Normal compressibility. Venous Reflux:  None. Other Findings: There is a minimal amount of subcutaneous edema at the level of the left calf (image 64). IMPRESSION: No evidence of DVT within either lower extremity. Electronically Signed   By: Simonne Come M.D.   On: 02/13/2020 12:16   DG Chest Port 1 View  Result Date: 02/13/2020 CLINICAL DATA:  Acute on chronic respiratory failure EXAM: PORTABLE CHEST 1 VIEW COMPARISON:  Yesterday FINDINGS: Cardiomegaly with edema and diffuse vascular enlargement. There has been improvement since yesterday in the degree of pulmonary opacification. No visible effusion or pneumothorax. IMPRESSION: Improved CHF.  Electronically Signed   By: Marnee Spring M.D.   On: 02/13/2020 07:34   DG Chest Portable 1 View  Result Date: 02/12/2020 CLINICAL DATA:  Shortness of breath EXAM: PORTABLE CHEST 1 VIEW COMPARISON:  06/17/2019 FINDINGS: Bilateral pulmonary opacities. Possible pleural effusions. Cardiomediastinal contours are obscured. There is cardiomegaly. No pneumothorax. IMPRESSION: Bilateral pulmonary opacities with  cardiomegaly and possible pleural effusions. Favored to reflect pulmonary edema although pneumonia is not excluded. Electronically Signed   By: Guadlupe Spanish M.D.   On: 02/12/2020 13:41   ECHOCARDIOGRAM COMPLETE  Result Date: 02/13/2020    ECHOCARDIOGRAM REPORT   Patient Name:   Katherine Scott Date of Exam: 02/13/2020 Medical Rec #:  [id number]    Height:       62.0 in Accession #:    [id number]   Weight:       382.3 lb Date of Birth:  May 10, 1960    BSA:          2.518 m Patient Age:    59 years     BP:           132/83 mmHg Patient Gender: F            HR:           115 bpm. Exam Location:  [facility] Procedure: 2D Echo, Color Doppler, Cardiac Doppler and Intracardiac            Opacification Agent Indications:     I48.91 Atrial fibrillation  History:         Patient has no prior history of Echocardiogram examinations.                  COPD; Risk Factors:Hypertension and Sleep Apnea.  Sonographer:     Humphrey Rolls RDCS (AE) Referring Phys:  [id number] Tresa Endo A GRIFFITH Diagnosing Phys: Marcina Millard MD  Sonographer Comments: Technically difficult study due to poor echo windows. Image acquisition challenging due to patient body habitus and Image acquisition challenging due to COPD. IMPRESSIONS  1. Left ventricular ejection fraction, by estimation, is 45 to 50%. The left ventricle has mildly decreased function. The left ventricle has no regional wall motion abnormalities. Left ventricular diastolic parameters were normal.  2. Right ventricular systolic function is normal. The right ventricular size is normal.  3. The  mitral valve is normal in structure. Mild mitral valve regurgitation. No evidence of mitral stenosis.  4. The aortic valve is normal in structure. Aortic valve regurgitation is not visualized. No aortic stenosis is present.  5. The inferior vena cava is normal in size with greater than 50% respiratory variability, suggesting right atrial pressure of 3 mmHg. FINDINGS  Left Ventricle: Left ventricular ejection fraction, by estimation, is 45 to 50%. The left ventricle has mildly decreased function. The left ventricle has no regional wall motion abnormalities. Definity contrast agent was given IV to delineate the left ventricular endocardial borders. The left ventricular internal cavity size was normal in size. There is no left ventricular hypertrophy. Left ventricular diastolic parameters were normal. Right Ventricle: The right ventricular size is normal. No increase in right ventricular wall thickness. Right ventricular systolic function is normal. Left Atrium: Left atrial size was normal in size. Right Atrium: Right atrial size was normal in size. Pericardium: There is no evidence of pericardial effusion. Mitral Valve: The mitral valve is normal in structure. Mild mitral valve regurgitation. No evidence of mitral valve stenosis. MV peak gradient, 10.0 mmHg. The mean mitral valve gradient is 5.0 mmHg. Tricuspid Valve: The tricuspid valve is normal in structure. Tricuspid valve regurgitation is mild . No evidence of tricuspid stenosis. Aortic Valve: The aortic valve is normal in structure. Aortic valve regurgitation is not visualized. No aortic stenosis is present. Aortic valve mean gradient measures 4.0 mmHg. Aortic valve peak gradient measures 8.4 mmHg. Aortic valve area, by VTI measures 3.65 cm.  Pulmonic Valve: The pulmonic valve was normal in structure. Pulmonic valve regurgitation is not visualized. No evidence of pulmonic stenosis. Aorta: The aortic root is normal in size and structure. Venous: The inferior vena  cava is normal in size with greater than 50% respiratory variability, suggesting right atrial pressure of 3 mmHg. IAS/Shunts: No atrial level shunt detected by color flow Doppler.  LEFT VENTRICLE PLAX 2D LVIDd:         5.14 cm  Diastology LVIDs:         3.97 cm  LV e' medial:    9.79 cm/s LV PW:         1.25 cm  LV E/e' medial:  12.3 LV IVS:        1.25 cm  LV e' lateral:   11.00 cm/s LVOT diam:     2.40 cm  LV E/e' lateral: 11.0 LV SV:         81 LV SV Index:   32 LVOT Area:     4.52 cm  LEFT ATRIUM         Index LA diam:    3.80 cm 1.51 cm/m  AORTIC VALVE                   PULMONIC VALVE AV Area (Vmax):    3.68 cm    PV Vmax:       1.33 m/s AV Area (Vmean):   3.46 cm    PV Vmean:      86.100 cm/s AV Area (VTI):     3.65 cm    PV VTI:        0.301 m AV Vmax:           145.00 cm/s PV Peak grad:  7.1 mmHg AV Vmean:          91.200 cm/s PV Mean grad:  3.0 mmHg AV VTI:            0.223 m AV Peak Grad:      8.4 mmHg AV Mean Grad:      4.0 mmHg LVOT Vmax:         118.00 cm/s LVOT Vmean:        69.700 cm/s LVOT VTI:          0.180 m LVOT/AV VTI ratio: 0.81  AORTA Ao Root diam: 3.10 cm MITRAL VALVE MV Area (PHT): 5.78 cm     SHUNTS MV Peak grad:  10.0 mmHg    Systemic VTI:  0.18 m MV Mean grad:  5.0 mmHg     Systemic Diam: 2.40 cm MV Vmax:       1.58 m/s MV Vmean:      106.0 cm/s MV Decel Time: 131 msec MV E velocity: 120.67 cm/s Marcina Millard MD Electronically signed by Marcina Millard MD Signature Date/Time: 02/13/2020/1:44:18 PM    Final         The results of significant diagnostics from this hospitalization (including imaging, microbiology, ancillary and laboratory) are listed below for reference.     Microbiology: Recent Results (from the past 240 hour(s))  Respiratory Panel by RT PCR (Flu A&B, Covid) - Nasopharyngeal Swab     Status: None   Collection Time: 02/12/20 12:58 PM   Specimen: Nasopharyngeal Swab  Result Value Ref Range Status   SARS Coronavirus 2 by RT PCR NEGATIVE NEGATIVE  Final    Comment: (NOTE) SARS-CoV-2 target nucleic acids are NOT DETECTED.  The SARS-CoV-2 RNA is generally detectable in upper respiratoy specimens  during the acute phase of infection. The lowest concentration of SARS-CoV-2 viral copies this assay can detect is 131 copies/mL. A negative result does not preclude SARS-Cov-2 infection and should not be used as the sole basis for treatment or other patient management decisions. A negative result may occur with  improper specimen collection/handling, submission of specimen other than nasopharyngeal swab, presence of viral mutation(s) within the areas targeted by this assay, and inadequate number of viral copies (<131 copies/mL). A negative result must be combined with clinical observations, patient history, and epidemiological information. The expected result is Negative.  Fact Sheet for Patients:  https://www.moore.com/  Fact Sheet for Healthcare Providers:  https://www.young.biz/  This test is no t yet approved or cleared by the [place] FDA and  has been authorized for detection and/or diagnosis of SARS-CoV-2 by FDA under an Emergency Use Authorization (EUA). This EUA will remain  in effect (meaning this test can be used) for the duration of the COVID-19 declaration under Section 564(b)(1) of the Act, 21 U.S.C. section 360bbb-3(b)(1), unless the authorization is terminated or revoked sooner.     Influenza A by PCR NEGATIVE NEGATIVE Final   Influenza B by PCR NEGATIVE NEGATIVE Final    Comment: (NOTE) The Xpert Xpress SARS-CoV-2/FLU/RSV assay is intended as an aid in  the diagnosis of influenza from Nasopharyngeal swab specimens and  should not be used as a sole basis for treatment. Nasal washings and  aspirates are unacceptable for Xpert Xpress SARS-CoV-2/FLU/RSV  testing.  Fact Sheet for Patients: https://www.moore.com/  Fact Sheet for Healthcare  Providers: https://www.young.biz/  This test is not yet approved or cleared by the [place] FDA and  has been authorized for detection and/or diagnosis of SARS-CoV-2 by  FDA under an Emergency Use Authorization (EUA). This EUA will remain  in effect (meaning this test can be used) for the duration of the  Covid-19 declaration under Section 564(b)(1) of the Act, 21  U.S.C. section 360bbb-3(b)(1), unless the authorization is  terminated or revoked. Performed at [facility], [address] [place], [place] [address]   Culture, blood (single)     Status: None   Collection Time: 02/12/20  3:32 PM   Specimen: BLOOD  Result Value Ref Range Status   Specimen Description BLOOD BLOOD LEFT FOREARM  Final   Special Requests   Final    BOTTLES DRAWN AEROBIC AND ANAEROBIC Blood Culture adequate volume   Culture   Final    NO GROWTH 5 DAYS Performed at [facility], [address] [place], [place] [address]    Report Status 02/17/2020 FINAL  Final     Labs: BNP (last 3 results) Recent Labs    03/20/19 1619 02/12/20 1258  BNP 106.0* 140.9*   Basic Metabolic Panel: Recent Labs  Lab 02/14/20 0502 02/14/20 0502 02/15/20 0510 02/16/20 0342 02/17/20 0545 02/19/20 0454 02/20/20 0521  NA 137   < > 139 136 140 138 140  K 4.6   < > 4.5 3.8 3.7 3.2* 3.7  CL 94*   < > 94* 88* 90* 89* 92*  CO2 33*   < > 35* 37* 40* 42* 39*  GLUCOSE 178*   < > 194* 151* 109* 105* 108*  BUN 37*   < > 33* 30* 27* 22* 19  CREATININE 1.10*   < > 0.87 0.97 0.91 0.96 0.88  CALCIUM 9.2   < > 8.6* 8.3* 8.3* 8.4* 8.6*  MG 2.6*  --   --   --   --  2.2 2.2  PHOS  --   --   --   --   --  3.3 3.5   < > = values in this interval not displayed.   Liver Function Tests: Recent Labs  Lab 02/19/20 0454 02/20/20 0521  ALBUMIN 3.1* 3.0*   No results for input(s): LIPASE, AMYLASE in the last 168 hours. No results for input(s): AMMONIA in the last 168 hours. CBC: Recent  Labs  Lab 02/14/20 0502 02/17/20 0545 02/20/20 0521  WBC 9.9 7.8 8.1  HGB 11.0* 12.5 11.5*  HCT 36.2 40.7 36.9  MCV 95.5 94.9 94.6  PLT 229 207 172   Cardiac Enzymes: No results for input(s): CKTOTAL, CKMB, CKMBINDEX, TROPONINI in the last 168 hours. BNP: Invalid input(s): POCBNP CBG: No results for input(s): GLUCAP in the last 168 hours. D-Dimer No results for input(s): DDIMER in the last 72 hours. Hgb A1c No results for input(s): HGBA1C in the last 72 hours. Lipid Profile No results for input(s): CHOL, HDL, LDLCALC, TRIG, CHOLHDL, LDLDIRECT in the last 72 hours. Thyroid function studies No results for input(s): TSH, T4TOTAL, T3FREE, THYROIDAB in the last 72 hours.  Invalid input(s): FREET3 Anemia work up No results for input(s): VITAMINB12, FOLATE, FERRITIN, TIBC, IRON, RETICCTPCT in the last 72 hours. Urinalysis    Component Value Date/Time   COLORURINE Yellow 11/13/2011 1415   APPEARANCEUR Cloudy 11/13/2011 1415   LABSPEC 1.023 11/13/2011 1415   PHURINE 5.0 11/13/2011 1415   GLUCOSEU Negative 11/13/2011 1415   HGBUR Negative 11/13/2011 1415   BILIRUBINUR Negative 11/13/2011 1415   KETONESUR Negative 11/13/2011 1415   PROTEINUR Negative 11/13/2011 1415   NITRITE Negative 11/13/2011 1415   LEUKOCYTESUR Negative 11/13/2011 1415   Sepsis Labs Invalid input(s): PROCALCITONIN,  WBC,  LACTICIDVEN   Time coordinating discharge: 45 minutes  SIGNED:  Almon Herculesaye T Lamontae Ricardo, MD  [facility] 02/20/2020, 2:50 PM  If 7PM-7AM, please contact night-coverage [email or web address]

## 2020-02-20 NOTE — TOC Transition Note (Signed)
Transition of Care [facility]) - CM/SW Discharge Note   Patient Details  Name: Katherine Scott MRN: [id number] Date of Birth: 1960-12-26  Transition of Care [facility]) CM/SW Contact:  Shawn Route, RN Phone Number: 02/20/2020, 1:42 PM   Clinical Narrative:     Patient offered choice.  She decided to go home with self care.  She asked for a consult to [facility].  She reports having a sister that lives close by, but she feels she can manage at home without HH at this time.  Patient received Walker from [employer].  Final next level of care: Home/Self Care Barriers to Discharge: Barriers Resolved   Patient Goals and CMS Choice Patient states their goals for this hospitalization and ongoing recovery are:: To go home without hh CMS Medicare.gov Compare Post Acute Care list provided to:: Patient Choice offered to / list presented to : Patient   Discharge Plan and Services                DME Arranged: Dan Humphreys rolling (Bariatric Dan Humphreys) DME Agency: [employer] Date DME Agency Contacted: 02/20/20 Time DME Agency Contacted: [phone] Representative spoke with at DME Agency: Santina Evans            Social Determinants of Health (SDOH) Interventions     Readmission Risk Interventions Readmission Risk Prevention Plan 02/16/2020 02/08/2019  Post Dischage Appt - Complete  Medication Screening - Complete  Transportation Screening Complete Complete  PCP or Specialist Appt within 5-7 Days Not Complete -  Home Care Screening Not Complete -  Medication Review (RN CM) Complete -  Some recent data might be hidden

## 2020-02-20 NOTE — Progress Notes (Signed)
Occupational Therapy Treatment Patient Details Name: Katherine Scott MRN: [id number] DOB: Nov 09, 1960 Today's Date: 02/20/2020    History of present illness Savahanna Almendariz is a 59yoF who [facility] on 11[facility] c SOB adn increased supplemental O2 requirements. PMH inclusive of AF on eloquis, COPD, morbid obesity, lymphedema, OSA on CPAP, CRF on 2L continuous. Pt reports recent fevers, but in ED COVID and Flu tests are (-).   OT comments  Ms Klinke was seen for OT treatment on this date. Upon arrival to room pt awake reclined in bed excited to be d/c home today. Pt endorses mild concerns over bathing - MIN VCs to problem solve. Pt reports plan to install hand held shower head and use shower seat.  Pt educated in energy conservation strategies including pursed lip breathing, activity pacing, home/routines modifications, work simplification, AE/DME, prioritizing of meaningful occupations, and falls prevention.  Pt verbalized understanding and would benefit from additional skilled OT services to maximize recall and carryover of learned techniques and facilitate implementation of learned techniques into daily routines. Pt making good progress toward goals. Pt continues to benefit from skilled OT services to maximize return to PLOF and minimize risk of future falls, injury, caregiver burden, and readmission. Will continue to follow POC. Discharge recommendation updated to [facility] to reflect pt progress.   Follow Up Recommendations  Home health OT;Other (comment) (Supervision for mobility)    Equipment Recommendations  Other (comment) (handheld shower head )    Recommendations for Other Services      Precautions / Restrictions Precautions Precautions: Fall Restrictions Weight Bearing Restrictions: No       Mobility Bed Mobility Overal bed mobility: Modified Independent      General bed mobility comments: From flat bed increased time only   Transfers  General transfer comment: not tested         ADL either performed or assessed with clinical judgement   ADL Overall ADL's : Needs assistance/impaired           General ADL Comments: Independently verbalizes safe tub t/f and DME needs. Educated on importance of shoes for falls prevention               Cognition Arousal/Alertness: Awake/alert Behavior During Therapy: WFL for tasks assessed/performed Overall Cognitive Status: Within Functional Limits for tasks assessed               Exercises Other Exercises Other Exercises: Pt educated re: DME recs, d/c recs, falls prevention (tub safety, footwear, night time toileting), ECS (pulse ox, weaning, task modifications), HEP (bed level exercises) Other Exercises: bed mobility           Pertinent Vitals/ Pain       Pain Assessment: No/denies pain         Frequency  Min 2X/week        Progress Toward Goals  OT Goals(current goals can now be found in the care plan section)  Progress towards OT goals: Progressing toward goals  Acute Rehab OT Goals Patient Stated Goal: move around better and use less oxygen OT Goal Formulation: With patient Time For Goal Achievement: 02/28/20 Potential to Achieve Goals: Good ADL Goals Pt Will Perform Grooming: with modified independence;standing (c LRAD PRN and no cues for seated rest breaks) Pt Will Perform Lower Body Dressing: with min guard assist;sit to/from stand (c LRAD PRN) Pt Will Transfer to Toilet: with supervision;ambulating;regular height toilet (c LRAD PRN)  Plan Discharge plan needs to be updated;Frequency remains appropriate  AM-PAC OT "6 Clicks" Daily Activity     Outcome Measure   Help from another person eating meals?: None Help from another person taking care of personal grooming?: None Help from another person toileting, which includes using toliet, bedpan, or urinal?: A Little Help from another person bathing (including washing, rinsing, drying)?: A Little Help from another person to put on  and taking off regular upper body clothing?: None Help from another person to put on and taking off regular lower body clothing?: A Little 6 Click Score: 21    End of Session Equipment Utilized During Treatment: Oxygen  OT Visit Diagnosis: Other abnormalities of gait and mobility (R26.89);Muscle weakness (generalized) (M62.81)   Activity Tolerance Patient tolerated treatment well   Patient Left in bed;with call bell/phone within reach   Nurse Communication          Time: 0301-3143 OT Time Calculation (min): 13 min  Charges: OT General Charges $OT Visit: 1 Visit OT Treatments $Self Care/Home Management : 8-22 mins  [name], M.S. OTR/L  02/20/20, 10:31 AM  ascom [phone]

## 2020-02-22 NOTE — Progress Notes (Deleted)
°   [name], female    DOB: 1960/06/02, 59 y.o.   MRN: [id number]  HPI  Katherine Scott is a 59 y/o female with a history of  Echo report from 02/13/20 reviewed and showed an EF of 45-50% along with mild MR.  Admitted 02/12/20 due to acute on chronic HF/COPD along with possible pneumonia. Cardiology & wound consult obtained. Initially needed bipap. Was given IV lasix with transition to oral diuretics. UNNA boots applied for lymphedema. Negative 12L. Discharged after 8 days.   She presents today for her initial visit with a chief complaint of   Review of Systems    Physical Exam  Assessment & Plan:  1: Chronic heart failure with mildly reduced ejection fraction- - NYHA class - saw cardiology (Fath) 10/20/19 - BNP 02/12/20 was 140.9  2: Atrial fibrillation- - BMP 02/20/20 reviewed and showed sodium 140, potassium 3.7, creatinine 0.88 and GFR >60  3: COPD- - saw pulmonology (Hunsucker) 01/22/20  4: Sleep apnea- - saw PCP Althea Charon) 08/06/19  5: Lymphedema-  - saw vascular Manson Passey) 01/28/20

## 2020-02-23 ENCOUNTER — Ambulatory Visit: Payer: BC Managed Care – PPO | Admitting: Family

## 2020-02-24 ENCOUNTER — Other Ambulatory Visit
Admission: RE | Admit: 2020-02-24 | Discharge: 2020-02-24 | Disposition: A | Discharge status: Home / Self Care | Payer: BC Managed Care – PPO | Source: Ambulatory Visit | Attending: Cardiology | Admitting: Cardiology

## 2020-02-24 ENCOUNTER — Inpatient Hospital Stay: Payer: BC Managed Care – PPO | Admitting: Family Medicine

## 2020-02-24 DIAGNOSIS — J441 Chronic obstructive pulmonary disease with (acute) exacerbation: Secondary | ICD-10-CM | POA: Diagnosis not present

## 2020-02-24 DIAGNOSIS — I4819 Other persistent atrial fibrillation: Secondary | ICD-10-CM | POA: Diagnosis not present

## 2020-02-24 DIAGNOSIS — J9621 Acute and chronic respiratory failure with hypoxia: Secondary | ICD-10-CM | POA: Insufficient documentation

## 2020-02-24 DIAGNOSIS — G4733 Obstructive sleep apnea (adult) (pediatric): Secondary | ICD-10-CM | POA: Diagnosis not present

## 2020-02-24 LAB — BRAIN NATRIURETIC PEPTIDE: B Natriuretic Peptide: 78.9 pg/mL (ref 0.0–100.0)

## 2020-02-26 ENCOUNTER — Telehealth: Payer: Self-pay | Admitting: Family Medicine

## 2020-02-26 NOTE — Telephone Encounter (Signed)
I called and spoke with the patient and relay the message. She said she will f/u with her.

## 2020-02-26 NOTE — Telephone Encounter (Signed)
Case Manager called to inform the doctor and nurse that she has not been able to reach the patient since she was discharged.  Please advise and call if there are any questions at [phone]

## 2020-03-15 ENCOUNTER — Encounter: Payer: Self-pay | Admitting: Family

## 2020-03-15 ENCOUNTER — Other Ambulatory Visit: Payer: Self-pay

## 2020-03-15 ENCOUNTER — Ambulatory Visit: Payer: BC Managed Care – PPO | Attending: Family | Admitting: Family

## 2020-03-15 VITALS — BP 161/89 | HR 93 | Resp 20 | Ht 62.0 in | Wt 353.1 lb

## 2020-03-15 DIAGNOSIS — I509 Heart failure, unspecified: Secondary | ICD-10-CM | POA: Diagnosis not present

## 2020-03-15 DIAGNOSIS — R0602 Shortness of breath: Secondary | ICD-10-CM | POA: Insufficient documentation

## 2020-03-15 DIAGNOSIS — G4730 Sleep apnea, unspecified: Secondary | ICD-10-CM | POA: Insufficient documentation

## 2020-03-15 DIAGNOSIS — I110 Hypertensive heart disease with heart failure: Secondary | ICD-10-CM | POA: Insufficient documentation

## 2020-03-15 DIAGNOSIS — Z7722 Contact with and (suspected) exposure to environmental tobacco smoke (acute) (chronic): Secondary | ICD-10-CM | POA: Insufficient documentation

## 2020-03-15 DIAGNOSIS — M25569 Pain in unspecified knee: Secondary | ICD-10-CM | POA: Insufficient documentation

## 2020-03-15 DIAGNOSIS — Z8249 Family history of ischemic heart disease and other diseases of the circulatory system: Secondary | ICD-10-CM | POA: Diagnosis not present

## 2020-03-15 DIAGNOSIS — Z7901 Long term (current) use of anticoagulants: Secondary | ICD-10-CM | POA: Insufficient documentation

## 2020-03-15 DIAGNOSIS — Z79899 Other long term (current) drug therapy: Secondary | ICD-10-CM | POA: Insufficient documentation

## 2020-03-15 DIAGNOSIS — I4819 Other persistent atrial fibrillation: Secondary | ICD-10-CM

## 2020-03-15 DIAGNOSIS — I890 Lymphedema, not elsewhere classified: Secondary | ICD-10-CM | POA: Insufficient documentation

## 2020-03-15 DIAGNOSIS — J449 Chronic obstructive pulmonary disease, unspecified: Secondary | ICD-10-CM

## 2020-03-15 DIAGNOSIS — I5022 Chronic systolic (congestive) heart failure: Secondary | ICD-10-CM

## 2020-03-15 DIAGNOSIS — G4733 Obstructive sleep apnea (adult) (pediatric): Secondary | ICD-10-CM

## 2020-03-15 NOTE — Progress Notes (Signed)
[name], female    DOB: 07/17/1960, 59 y.o.   MRN: [id number]  HPI  Katherine Scott is a 59 y/o female with a history of HTN, thyroid disease, atrial fibrillation, COPD, sleep apnea and chronic heart failure.   Echo report from 02/13/20 reviewed and showed an EF of 45-50% along with mild MR.  Admitted 02/12/20 due to acute on chronic HF/COPD along with possible pneumonia. Cardiology & wound consult obtained. Initially needed bipap. Was given IV lasix with transition to oral diuretics. UNNA boots applied for lymphedema. Negative 12L. Discharged after 8 days.   She presents today for her initial visit with a chief complaint of moderate shortness of breath upon minimal exertion. She describes this as having been present for several years. Does get short of breath with showering and when she experiences this, she sits down for a few minutes until her breathing improves. She has associated pedal edema (improving), knee pain and occasional nose bleeds along with this. She denies any difficulty sleeping, dizziness, abdominal distention, palpitations, chest pain, cough, fatigue or weight gain.   Wears her CPAP and oxygen at 1.5L at bedtime and infrequently wears her oxygen during the day.   Past Medical History:  Diagnosis Date  . Abnormal mammogram, unspecified 2013  . Atrial fibrillation (HCC)    2013  . Breast screening, unspecified   . CHF (congestive heart failure) (HCC)   . COPD (chronic obstructive pulmonary disease) (HCC)   . History of continuous positive airway pressure (CPAP) therapy at home   . Hypothyroidism   . Obesity, unspecified   . Pneumonia   . Screening for obesity   . Sleep apnea   . Special screening for malignant neoplasms, colon   . Unspecified essential hypertension    Past Surgical History:  Procedure Laterality Date  . ABDOMINAL HYSTERECTOMY  2011  . ANTERIOR CRUCIATE LIGAMENT REPAIR  2003  . BREAST BIOPSY Left January 22, 2012   left breast  stereotactic biopsy showed fibroadenomatous changes with microcalcifications, fat necrosis, and sclerosing adenosis and columnar cell changes. No evidence of atypia or malignancy.  Marland Kitchen CARDIOVERSION N/A 09/22/2016   Procedure: CARDIOVERSION;  Surgeon: Dalia Heading, MD;  Location: [facility] ORS;  Service: Cardiovascular;  Laterality: N/A;  . CARDIOVERSION N/A 01/17/2018   Procedure: CARDIOVERSION;  Surgeon: Dalia Heading, MD;  Location: [facility] ORS;  Service: Cardiovascular;  Laterality: N/A;  . CARDIOVERSION N/A 01/09/2019   Procedure: CARDIOVERSION;  Surgeon: Dalia Heading, MD;  Location: [facility] ORS;  Service: Cardiovascular;  Laterality: N/A;  . CESAREAN SECTION  1985, 1987, and 1998  . COLONOSCOPY  2011   Dr. Mechele Collin  . ELECTROPHYSIOLOGIC STUDY N/A 12/03/2014   Procedure: Cardioversion;  Surgeon: Dalia Heading, MD;  Location: [facility] ORS;  Service: Cardiovascular;  Laterality: N/A;  . ELECTROPHYSIOLOGIC STUDY N/A 04/01/2015   Procedure: Cardioversion;  Surgeon: Dalia Heading, MD;  Location: [facility] ORS;  Service: Cardiovascular;  Laterality: N/A;  . KNEE SURGERY  2013   Family History  Problem Relation Age of Onset  . Colon cancer Mother        diagnosis age 107's  . Colon cancer Sister        diagnosis age 39  . Atrial fibrillation Sister   . Breast cancer Other        Maternal Grandmother; diagnosis age 59's  . Lung cancer Other        Paternal Grandmother; diagnosis age 1's  . Colon cancer Other  Paternal Grandfather; diagnosis age 63's  . Heart disease Father    Social History   Tobacco Use  . Smoking status: Passive Smoke Exposure - Never Smoker  . Smokeless tobacco: Never Used  Substance Use Topics  . Alcohol use: No   Allergies  Allergen Reactions  . Amiodarone     Caused toxicity    Prior to Admission medications   Medication Sig Start Date End Date Taking? Authorizing Provider  albuterol (VENTOLIN HFA) 108 (90 Base) MCG/ACT inhaler Inhale 2 puffs into the lungs  every 6 (six) hours as needed for wheezing. 06/17/19  Yes Lorin Glass, MD  apixaban (ELIQUIS) 5 MG TABS tablet Take 5 mg by mouth 2 (two) times daily.  10/31/17  Yes [provider]  cyanocobalamin 2000 MCG tablet Take 2,000 mcg by mouth daily.    Yes [provider]  Fluticasone-Umeclidin-Vilant (TRELEGY ELLIPTA) 100-62.5-25 MCG/INH AEPB Inhale 1 Inhaler into the lungs daily. 01/22/20  Yes Hunsucker, Lesia Sago, MD  furosemide (LASIX) 40 MG tablet Take 1 tablet (40 mg total) by mouth 2 (two) times daily. Patient taking differently: Take 80 mg by mouth daily.  02/20/20  Yes Almon Hercules, MD  levothyroxine (SYNTHROID) 75 MCG tablet TAKE 1 TABLET BY MOUTH DAILY BEFORE BREAKFAST. WILL NEED THYROID LAB & FOLLOW-UP FOR FURTHER REFILLS Patient taking differently: Take 75 mcg by mouth daily before breakfast.  02/03/20  Yes Karamalegos, Netta Neat, DO  metoprolol succinate (TOPROL-XL) 25 MG 24 hr tablet Take 3 tablets (75 mg total) by mouth daily. 02/20/20  Yes Almon Hercules, MD  Multiple Vitamins-Minerals (MULTIVITAMIN WITH MINERALS) tablet Take 1 tablet by mouth daily.   Yes [provider]  OXYGEN Inhale 2 L/L into the lungs as needed.    Yes [provider]  potassium chloride SA (K-DUR,KLOR-CON) 20 MEQ tablet Take 1 tablet (20 mEq total) by mouth daily. Patient taking differently: Take 20 mEq by mouth at bedtime.  02/19/15  Yes Dalia Heading, MD  predniSONE (DELTASONE) 10 MG tablet TAKE 1 TABLET (10 MG TOTAL) BY MOUTH DAILY WITH BREAKFAST. 07/21/19  Yes Lorin Glass, MD  Spacer/Aero-Holding Chambers DEVI 1 Device by Does not apply route as needed. 05/24/18  Yes Galen Manila, NP    Review of Systems  Constitutional: Negative for appetite change and fatigue.  HENT: Positive for nosebleeds. Negative for congestion.   Eyes: Negative.   Respiratory: Positive for shortness of breath (easily). Negative for cough.   Cardiovascular: Positive for leg  swelling ("much better"). Negative for chest pain and palpitations.  Gastrointestinal: Negative for abdominal distention and abdominal pain.  Endocrine: Negative.   Genitourinary: Negative.   Musculoskeletal: Positive for arthralgias (knees and right ankle). Negative for back pain.  Skin: Negative.   Allergic/Immunologic: Negative.   Neurological: Negative for dizziness and light-headedness.  Hematological: Negative for adenopathy. Does not bruise/bleed easily.  Psychiatric/Behavioral: Negative for dysphoric mood and sleep disturbance (sleeping on 1 pillow with CPAP with oxygen at 1.5L). The patient is not nervous/anxious.     Vitals:   03/15/20 1022  BP: (!) 161/89  Pulse: 93  Resp: 20  SpO2: 92%  Weight: (!) 353 lb 2 oz (160.2 kg)  Height: 5\' 2"  (1.575 m)   Wt Readings from Last 3 Encounters:  03/15/20 (!) 353 lb 2 oz (160.2 kg)  02/20/20 (!) 355 lb 8 oz (161.3 kg)  01/28/20 (!) 372 lb (168.7 kg)   Lab Results  Component Value Date   CREATININE  0.88 02/20/2020   CREATININE 0.96 02/19/2020   CREATININE 0.91 02/17/2020    Physical Exam Vitals and nursing note reviewed.  Constitutional:      Appearance: She is well-developed.  HENT:     Head: Normocephalic and atraumatic.  Cardiovascular:     Rate and Rhythm: Normal rate. Rhythm irregular.  Pulmonary:     Effort: Pulmonary effort is normal. No respiratory distress.     Breath sounds: No wheezing or rales.  Abdominal:     Palpations: Abdomen is soft.     Tenderness: There is no abdominal tenderness.  Musculoskeletal:     Right lower leg: No tenderness. Edema (1+ pitting) present.     Left lower leg: No tenderness. Edema (1+ pitting) present.  Skin:    General: Skin is warm and dry.  Neurological:     General: No focal deficit present.     Mental Status: She is alert and oriented to person, place, and time.  Psychiatric:        Mood and Affect: Mood normal.        Behavior: Behavior normal.    Assessment &  Plan:  1: Chronic heart failure with mildly reduced ejection fraction- - NYHA class III - euvolemic today - weighing daily; reminded to call for an overnight weight gain of > 2 pounds or a weekly weight gain of >5 pounds - not adding salt and she has been diligent in reading food labels so as to keep her sodium content to 2000mg  / day; low sodium cookbook and written dietary information given to her today - saw cardiology (Fath) 02/24/20 - reports receiving her flu vaccine along with two covid vaccines - BP elevated but she says that it's because she got up and moved around; last noted BP was normal  2: Atrial fibrillation- - BMP 02/24/20 reviewed and showed sodium 139, potassium 4.0, creatinine 1.0 and GFR 57 - previous cardioversions done but without success  3: COPD- - saw pulmonology (Hunsucker) 01/22/20 - wears oxygen 2L at bedtime and PRN during the day  4: Sleep apnea- - saw PCP Althea Charon) 08/06/19 - wearing CPAP nightly   5: Lymphedema-  - saw vascular Manson Passey) 01/28/20 - wearing compression socks daily with marked improvement (per patient) in the swelling; no further weeping of fluids noted by patient   Medication list reviewed.   Return in 6 weeks or sooner for any questions/problems before then.

## 2020-03-15 NOTE — Patient Instructions (Signed)
Continue weighing daily and call for an overnight weight gain of > 2 pounds or a weekly weight gain of >5 pounds. 

## 2020-03-16 DIAGNOSIS — S42252A Displaced fracture of greater tuberosity of left humerus, initial encounter for closed fracture: Secondary | ICD-10-CM | POA: Diagnosis not present

## 2020-03-17 ENCOUNTER — Other Ambulatory Visit: Payer: Self-pay | Admitting: Family Medicine

## 2020-03-17 ENCOUNTER — Other Ambulatory Visit: Payer: Self-pay | Admitting: Internal Medicine

## 2020-03-17 DIAGNOSIS — E039 Hypothyroidism, unspecified: Secondary | ICD-10-CM

## 2020-03-25 DIAGNOSIS — S42202A Unspecified fracture of upper end of left humerus, initial encounter for closed fracture: Secondary | ICD-10-CM | POA: Diagnosis not present

## 2020-04-15 DIAGNOSIS — S42202A Unspecified fracture of upper end of left humerus, initial encounter for closed fracture: Secondary | ICD-10-CM | POA: Insufficient documentation

## 2020-04-15 DIAGNOSIS — S42215A Unspecified nondisplaced fracture of surgical neck of left humerus, initial encounter for closed fracture: Secondary | ICD-10-CM | POA: Diagnosis not present

## 2020-04-22 NOTE — Progress Notes (Signed)
[name], female    DOB: [birth year], 60 y.o.   MRN: [id number]  HPI  Katherine Scott is a 60 y/o female with a history of HTN, thyroid disease, atrial fibrillation, COPD, sleep apnea and chronic heart failure.   Echo report from 02/13/20 reviewed and showed an EF of 45-50% along with mild MR.  Admitted 02/12/20 due to acute on chronic HF/COPD along with possible pneumonia. Cardiology & wound consult obtained. Initially needed bipap. Was given IV lasix with transition to oral diuretics. UNNA boots applied for lymphedema. Negative 12L. Discharged after 8 days.   She presents today for a follow-up visit with a chief complaint of moderate shortness of breath with little exertion. She says that it had worsened last week prior to being diagnosed with bronchitis but now seems to be improving. She has associated cough (improving), wheezing (improving), pedal edema (minimal) and left shoulder pain (due to fracture) along with this. She denies any difficulty sleeping, dizziness, abdominal distention, palpitations, chest pain, fatigue or weight gain.   Says that she's been very mindful of what foods and how much she is eating. She is not adding any salt to her food and has also tried to increase her activity. Will be starting PT on her shoulder soon.   Wears her CPAP and oxygen at 1.5L at bedtime and infrequently wears her oxygen during the day.   Past Medical History:  Diagnosis Date  . Abnormal mammogram, unspecified 2013  . Atrial fibrillation (HCC)    2013  . Breast screening, unspecified   . CHF (congestive heart failure) (HCC)   . COPD (chronic obstructive pulmonary disease) (HCC)   . History of continuous positive airway pressure (CPAP) therapy at home   . Hypothyroidism   . Obesity, unspecified   . Pneumonia   . Screening for obesity   . Sleep apnea   . Special screening for malignant neoplasms, colon   . Unspecified essential hypertension    Past Surgical History:  Procedure  Laterality Date  . ABDOMINAL HYSTERECTOMY  2011  . ANTERIOR CRUCIATE LIGAMENT REPAIR  2003  . BREAST BIOPSY Left January 22, 2012   left breast stereotactic biopsy showed fibroadenomatous changes with microcalcifications, fat necrosis, and sclerosing adenosis and columnar cell changes. No evidence of atypia or malignancy.  Marland Kitchen CARDIOVERSION N/A 09/22/2016   Procedure: CARDIOVERSION;  Surgeon: Dalia Heading, MD;  Location: [facility];  Service: Cardiovascular;  Laterality: N/A;  . CARDIOVERSION N/A 01/17/2018   Procedure: CARDIOVERSION;  Surgeon: Dalia Heading, MD;  Location: [facility];  Service: Cardiovascular;  Laterality: N/A;  . CARDIOVERSION N/A 01/09/2019   Procedure: CARDIOVERSION;  Surgeon: Dalia Heading, MD;  Location: [facility];  Service: Cardiovascular;  Laterality: N/A;  . CESAREAN SECTION  1985, 1987, and 1998  . COLONOSCOPY  2011   Dr. Mechele Collin  . ELECTROPHYSIOLOGIC STUDY N/A 12/03/2014   Procedure: Cardioversion;  Surgeon: Dalia Heading, MD;  Location: [facility];  Service: Cardiovascular;  Laterality: N/A;  . ELECTROPHYSIOLOGIC STUDY N/A 04/01/2015   Procedure: Cardioversion;  Surgeon: Dalia Heading, MD;  Location: [facility];  Service: Cardiovascular;  Laterality: N/A;  . KNEE SURGERY  2013   Family History  Problem Relation Age of Onset  . Colon cancer Mother        diagnosis age 60's  . Colon cancer Sister        diagnosis age 64  . Atrial fibrillation Sister   . Breast cancer Other  Maternal Grandmother; diagnosis age 33's  . Lung cancer Other        Paternal Grandmother; diagnosis age 38's  . Colon cancer Other        Paternal Grandfather; diagnosis age 28's  . Heart disease Father    Social History   Tobacco Use  . Smoking status: Passive Smoke Exposure - Never Smoker  . Smokeless tobacco: Never Used  Substance Use Topics  . Alcohol use: No   Allergies  Allergen Reactions  . Amiodarone     Caused toxicity    Prior to Admission medications    Medication Sig Start Date End Date Taking? Authorizing Provider  albuterol (VENTOLIN HFA) 108 (90 Base) MCG/ACT inhaler INHALE 2 PUFFS INTO THE LUNGS EVERY 6 (SIX) HOURS AS NEEDED FOR WHEEZING. 03/17/20  Yes Hunsucker, Lesia Sago, MD  apixaban (ELIQUIS) 5 MG TABS tablet Take 5 mg by mouth 2 (two) times daily.  10/31/17  Yes [provider]  cyanocobalamin 2000 MCG tablet Take 2,000 mcg by mouth daily.    Yes [provider]  doxycycline (VIBRAMYCIN) 100 MG capsule Take 100 mg by mouth 2 (two) times daily.   Yes [provider]  Fluticasone-Umeclidin-Vilant (TRELEGY ELLIPTA) 100-62.5-25 MCG/INH AEPB Inhale 1 Inhaler into the lungs daily. 01/22/20  Yes Hunsucker, Lesia Sago, MD  furosemide (LASIX) 40 MG tablet Take 1 tablet (40 mg total) by mouth 2 (two) times daily. Patient taking differently: Take 80 mg by mouth daily. 02/20/20  Yes Almon Hercules, MD  levothyroxine (SYNTHROID) 75 MCG tablet TAKE 1 TABLET BY MOUTH DAILY BEFORE BREAKFAST. WILL NEED THYROID LAB & FOLLOW-UP FOR FURTHER REFILLS Patient taking differently: Take 75 mcg by mouth daily before breakfast. 02/03/20  Yes Karamalegos, Netta Neat, DO  metoprolol succinate (TOPROL-XL) 25 MG 24 hr tablet Take 3 tablets (75 mg total) by mouth daily. 02/20/20  Yes Almon Hercules, MD  Multiple Vitamins-Minerals (MULTIVITAMIN WITH MINERALS) tablet Take 1 tablet by mouth daily.   Yes [provider]  OXYGEN Inhale 2 L/L into the lungs as needed.    Yes [provider]  potassium chloride SA (K-DUR,KLOR-CON) 20 MEQ tablet Take 1 tablet (20 mEq total) by mouth daily. Patient taking differently: Take 20 mEq by mouth at bedtime. 02/19/15  Yes Dalia Heading, MD  predniSONE (DELTASONE) 10 MG tablet TAKE 1 TABLET (10 MG TOTAL) BY MOUTH DAILY WITH BREAKFAST. 07/21/19  Yes Lorin Glass, MD  Spacer/Aero-Holding Chambers DEVI 1 Device by Does not apply route as needed. 05/24/18  Yes Galen Manila, NP    Review  of Systems  Constitutional: Negative for appetite change and fatigue.  HENT: Positive for nosebleeds. Negative for congestion.   Eyes: Negative.   Respiratory: Positive for cough, shortness of breath (easily) and wheezing.   Cardiovascular: Positive for leg swelling ("much better"). Negative for chest pain and palpitations.  Gastrointestinal: Negative for abdominal distention and abdominal pain.  Endocrine: Negative.   Genitourinary: Negative.   Musculoskeletal: Positive for arthralgias (left shoulder). Negative for back pain.  Skin: Negative.   Allergic/Immunologic: Negative.   Neurological: Negative for dizziness and light-headedness.  Hematological: Negative for adenopathy. Does not bruise/bleed easily.  Psychiatric/Behavioral: Negative for dysphoric mood and sleep disturbance (sleeping on 1 pillow with CPAP with oxygen at 1.5L). The patient is not nervous/anxious.    Vitals:   04/28/20 0955 04/28/20 1013  BP: (!) 180/70 135/60  Pulse: (!) 108 86  Resp: 18   SpO2: 92%   Weight: Marland Kitchen)  335 lb 8 oz (152.2 kg)   Height: 5\' 2"  (1.575 m)    Wt Readings from Last 3 Encounters:  04/28/20 (!) 335 lb 8 oz (152.2 kg)  03/15/20 (!) 353 lb 2 oz (160.2 kg)  02/20/20 (!) 355 lb 8 oz (161.3 kg)   Lab Results  Component Value Date   CREATININE 0.88 02/20/2020   CREATININE 0.96 02/19/2020   CREATININE 0.91 02/17/2020    Physical Exam Vitals and nursing note reviewed.  Constitutional:      Appearance: She is well-developed.  HENT:     Head: Normocephalic and atraumatic.  Cardiovascular:     Rate and Rhythm: Normal rate. Rhythm irregular.  Pulmonary:     Effort: Pulmonary effort is normal. No respiratory distress.     Breath sounds: No wheezing or rales.  Abdominal:     Palpations: Abdomen is soft.     Tenderness: There is no abdominal tenderness.  Musculoskeletal:     Right lower leg: No tenderness. No edema.     Left lower leg: No tenderness. No edema.  Skin:    General: Skin is  warm and dry.  Neurological:     General: No focal deficit present.     Mental Status: She is alert and oriented to person, place, and time.  Psychiatric:        Mood and Affect: Mood normal.        Behavior: Behavior normal.    Assessment & Plan:  1: Chronic heart failure with mildly reduced ejection fraction- - NYHA class III - euvolemic today - weighing daily; reminded to call for an overnight weight gain of > 2 pounds or a weekly weight gain of >5 pounds - weight down 17.4 pounds from last visit here 1 month ago - not adding salt and she has been diligent in reading food labels so as to keep her sodium content to 2000mg  / day - saw cardiology (Fath) 02/24/20 - reports receiving her flu vaccine along with two covid vaccines  2: Atrial fibrillation- - BMP 02/24/20 reviewed and showed sodium 139, potassium 4.0, creatinine 1.0 and GFR 57 - previous cardioversions done but without success  3: COPD- - saw pulmonology (Hunsucker) 01/22/20 - wears oxygen 2L at bedtime and PRN during the day  4: Sleep apnea- - saw PCP 01/24/20) 08/06/19 - wearing CPAP nightly   5: Lymphedema-  - saw vascular 08/08/19) 01/28/20 - wearing compression socks most days as her niece has to now put them on patient due to patient's fractured left shoulder   Medication list reviewed.   Due to HF stability will not schedule a return appointment at this time. Advised patient that she could call back at anytime to schedule an appointment and she was comfortable with this plan.

## 2020-04-28 ENCOUNTER — Ambulatory Visit: Payer: BC Managed Care – PPO | Attending: Family | Admitting: Family

## 2020-04-28 ENCOUNTER — Encounter: Payer: Self-pay | Admitting: Family

## 2020-04-28 ENCOUNTER — Other Ambulatory Visit: Payer: Self-pay

## 2020-04-28 VITALS — BP 135/60 | HR 86 | Resp 18 | Ht 62.0 in | Wt 335.5 lb

## 2020-04-28 DIAGNOSIS — Z8249 Family history of ischemic heart disease and other diseases of the circulatory system: Secondary | ICD-10-CM | POA: Insufficient documentation

## 2020-04-28 DIAGNOSIS — I5022 Chronic systolic (congestive) heart failure: Secondary | ICD-10-CM | POA: Diagnosis not present

## 2020-04-28 DIAGNOSIS — I110 Hypertensive heart disease with heart failure: Secondary | ICD-10-CM | POA: Diagnosis not present

## 2020-04-28 DIAGNOSIS — I890 Lymphedema, not elsewhere classified: Secondary | ICD-10-CM | POA: Diagnosis not present

## 2020-04-28 DIAGNOSIS — Z79890 Hormone replacement therapy (postmenopausal): Secondary | ICD-10-CM | POA: Insufficient documentation

## 2020-04-28 DIAGNOSIS — G4730 Sleep apnea, unspecified: Secondary | ICD-10-CM | POA: Diagnosis not present

## 2020-04-28 DIAGNOSIS — I4891 Unspecified atrial fibrillation: Secondary | ICD-10-CM | POA: Diagnosis not present

## 2020-04-28 DIAGNOSIS — Z79899 Other long term (current) drug therapy: Secondary | ICD-10-CM | POA: Diagnosis not present

## 2020-04-28 DIAGNOSIS — Z7952 Long term (current) use of systemic steroids: Secondary | ICD-10-CM | POA: Insufficient documentation

## 2020-04-28 DIAGNOSIS — J449 Chronic obstructive pulmonary disease, unspecified: Secondary | ICD-10-CM | POA: Diagnosis not present

## 2020-04-28 DIAGNOSIS — I4819 Other persistent atrial fibrillation: Secondary | ICD-10-CM

## 2020-04-28 DIAGNOSIS — Z7901 Long term (current) use of anticoagulants: Secondary | ICD-10-CM | POA: Insufficient documentation

## 2020-04-28 DIAGNOSIS — G4733 Obstructive sleep apnea (adult) (pediatric): Secondary | ICD-10-CM

## 2020-04-28 NOTE — Patient Instructions (Addendum)
Continue weighing daily and call for an overnight weight gain of > 2 pounds or a weekly weight gain of >5 pounds.   Call us in the future if you'd like to schedule another appointment 

## 2020-04-29 DIAGNOSIS — S42215A Unspecified nondisplaced fracture of surgical neck of left humerus, initial encounter for closed fracture: Secondary | ICD-10-CM | POA: Diagnosis not present

## 2020-04-30 DIAGNOSIS — M25612 Stiffness of left shoulder, not elsewhere classified: Secondary | ICD-10-CM | POA: Diagnosis not present

## 2020-04-30 DIAGNOSIS — M25512 Pain in left shoulder: Secondary | ICD-10-CM | POA: Diagnosis not present

## 2020-05-04 DIAGNOSIS — M25612 Stiffness of left shoulder, not elsewhere classified: Secondary | ICD-10-CM | POA: Diagnosis not present

## 2020-05-04 DIAGNOSIS — M25512 Pain in left shoulder: Secondary | ICD-10-CM | POA: Diagnosis not present

## 2020-05-06 DIAGNOSIS — M25512 Pain in left shoulder: Secondary | ICD-10-CM | POA: Diagnosis not present

## 2020-05-06 DIAGNOSIS — M25612 Stiffness of left shoulder, not elsewhere classified: Secondary | ICD-10-CM | POA: Diagnosis not present

## 2020-05-10 DIAGNOSIS — J189 Pneumonia, unspecified organism: Secondary | ICD-10-CM | POA: Diagnosis not present

## 2020-05-12 DIAGNOSIS — M25612 Stiffness of left shoulder, not elsewhere classified: Secondary | ICD-10-CM | POA: Diagnosis not present

## 2020-05-12 DIAGNOSIS — M25512 Pain in left shoulder: Secondary | ICD-10-CM | POA: Diagnosis not present

## 2020-05-14 DIAGNOSIS — M25612 Stiffness of left shoulder, not elsewhere classified: Secondary | ICD-10-CM | POA: Diagnosis not present

## 2020-05-14 DIAGNOSIS — M25512 Pain in left shoulder: Secondary | ICD-10-CM | POA: Diagnosis not present

## 2020-05-18 DIAGNOSIS — M25612 Stiffness of left shoulder, not elsewhere classified: Secondary | ICD-10-CM | POA: Diagnosis not present

## 2020-05-18 DIAGNOSIS — M25512 Pain in left shoulder: Secondary | ICD-10-CM | POA: Diagnosis not present

## 2020-05-21 DIAGNOSIS — M25512 Pain in left shoulder: Secondary | ICD-10-CM | POA: Diagnosis not present

## 2020-05-21 DIAGNOSIS — M25612 Stiffness of left shoulder, not elsewhere classified: Secondary | ICD-10-CM | POA: Diagnosis not present

## 2020-05-25 DIAGNOSIS — M25612 Stiffness of left shoulder, not elsewhere classified: Secondary | ICD-10-CM | POA: Diagnosis not present

## 2020-05-25 DIAGNOSIS — M25512 Pain in left shoulder: Secondary | ICD-10-CM | POA: Diagnosis not present

## 2020-05-27 DIAGNOSIS — S42215A Unspecified nondisplaced fracture of surgical neck of left humerus, initial encounter for closed fracture: Secondary | ICD-10-CM | POA: Diagnosis not present

## 2020-05-28 ENCOUNTER — Other Ambulatory Visit: Payer: Self-pay | Admitting: Family

## 2020-05-28 MED ORDER — FUROSEMIDE 40 MG PO TABS: 80.0000 mg | ORAL_TABLET | Freq: Every day | ORAL | 3 refills | Status: DC

## 2020-06-07 DIAGNOSIS — J189 Pneumonia, unspecified organism: Secondary | ICD-10-CM | POA: Diagnosis not present

## 2020-06-18 DIAGNOSIS — R0602 Shortness of breath: Secondary | ICD-10-CM | POA: Diagnosis not present

## 2020-06-18 DIAGNOSIS — Z6841 Body Mass Index (BMI) 40.0 and over, adult: Secondary | ICD-10-CM | POA: Diagnosis not present

## 2020-06-18 DIAGNOSIS — I4819 Other persistent atrial fibrillation: Secondary | ICD-10-CM | POA: Diagnosis not present

## 2020-06-18 DIAGNOSIS — J9621 Acute and chronic respiratory failure with hypoxia: Secondary | ICD-10-CM | POA: Diagnosis not present

## 2020-07-08 DIAGNOSIS — J189 Pneumonia, unspecified organism: Secondary | ICD-10-CM | POA: Diagnosis not present

## 2020-07-15 DIAGNOSIS — Z0271 Encounter for disability determination: Secondary | ICD-10-CM

## 2020-07-27 ENCOUNTER — Ambulatory Visit (INDEPENDENT_AMBULATORY_CARE_PROVIDER_SITE_OTHER): Payer: BC Managed Care – PPO | Admitting: Nurse Practitioner

## 2020-07-27 ENCOUNTER — Encounter (INDEPENDENT_AMBULATORY_CARE_PROVIDER_SITE_OTHER): Payer: Self-pay | Admitting: Nurse Practitioner

## 2020-07-27 ENCOUNTER — Other Ambulatory Visit: Payer: Self-pay

## 2020-07-27 VITALS — BP 177/71 | HR 90 | Ht 62.0 in | Wt 341.0 lb

## 2020-07-27 DIAGNOSIS — I890 Lymphedema, not elsewhere classified: Secondary | ICD-10-CM

## 2020-07-27 DIAGNOSIS — I5022 Chronic systolic (congestive) heart failure: Secondary | ICD-10-CM | POA: Diagnosis not present

## 2020-08-02 ENCOUNTER — Encounter (INDEPENDENT_AMBULATORY_CARE_PROVIDER_SITE_OTHER): Payer: Self-pay | Admitting: Nurse Practitioner

## 2020-08-02 DIAGNOSIS — I509 Heart failure, unspecified: Secondary | ICD-10-CM | POA: Insufficient documentation

## 2020-08-02 NOTE — Progress Notes (Signed)
Subjective:    [name], female    DOB: [birth year], 60 y.o.   MRN: [id number] Chief Complaint  Patient presents with  . Follow-up    6 mo no studies    Katherine Scott is a 60 year old female that presents today for follow-up in regards to her lymphedema.  The patient notes that in November she had an episode where she became very short of breath and had severe swelling and weeping from her lower extremities.  The patient was subsequently hospitalized and she was diagnosed with congestive heart failure.  At that time the patient was diuresed and since then the patient has not had any other issues with swelling, cellulitis, wounds or ulcerations.  She notes that her legs feel much better and her breathing is much improved.  She denies any fever or chills.   Review of Systems  Respiratory: Negative for shortness of breath.   Cardiovascular: Positive for leg swelling.  Skin: Negative for wound.  All other systems reviewed and are negative.      Objective:   Physical Exam Vitals reviewed.  HENT:     Head: Normocephalic.  Cardiovascular:     Rate and Rhythm: Normal rate.     Pulses: Normal pulses.  Pulmonary:     Effort: Pulmonary effort is normal.  Neurological:     Mental Status: She is alert and oriented to person, place, and time.  Psychiatric:        Mood and Affect: Mood normal.        Behavior: Behavior normal.        Thought Content: Thought content normal.        Judgment: Judgment normal.     BP (!) 177/71   Pulse 90   Ht 5\' 2"  (1.575 m)   Wt (!) 341 lb (154.7 kg)   BMI 62.37 kg/m   Past Medical History:  Diagnosis Date  . Abnormal mammogram, unspecified 2013  . Atrial fibrillation (HCC)    2013  . Breast screening, unspecified   . CHF (congestive heart failure) (HCC)   . COPD (chronic obstructive pulmonary disease) (HCC)   . History of continuous positive airway pressure (CPAP) therapy at home   . Hypothyroidism   . Obesity, unspecified   .  Pneumonia   . Screening for obesity   . Sleep apnea   . Special screening for malignant neoplasms, colon   . Unspecified essential hypertension     Social History   Socioeconomic History  . Marital status: Divorced    Spouse name: Not on file  . Number of children: Not on file  . Years of education: Not on file  . Highest education level: Not on file  Occupational History  . Not on file  Tobacco Use  . Smoking status: Passive Smoke Exposure - Never Smoker  . Smokeless tobacco: Never Used  Vaping Use  . Vaping Use: Never used  Substance and Sexual Activity  . Alcohol use: No  . Drug use: No  . Sexual activity: Not on file  Other Topics Concern  . Not on file  Social History Narrative  . Not on file   Social Determinants of Health   Financial Resource Strain: Not on file  Food Insecurity: Not on file  Transportation Needs: Not on file  Physical Activity: Not on file  Stress: Not on file  Social Connections: Not on file  Intimate Partner Violence: Not on file    Past Surgical History:  Procedure Laterality Date  . ABDOMINAL HYSTERECTOMY  2011  . ANTERIOR CRUCIATE LIGAMENT REPAIR  2003  . BREAST BIOPSY Left January 22, 2012   left breast stereotactic biopsy showed fibroadenomatous changes with microcalcifications, fat necrosis, and sclerosing adenosis and columnar cell changes. No evidence of atypia or malignancy.  Marland Kitchen CARDIOVERSION N/A 09/22/2016   Procedure: CARDIOVERSION;  Surgeon: Dalia Heading, MD;  Location: [facility];  Service: Cardiovascular;  Laterality: N/A;  . CARDIOVERSION N/A 01/17/2018   Procedure: CARDIOVERSION;  Surgeon: Dalia Heading, MD;  Location: [facility];  Service: Cardiovascular;  Laterality: N/A;  . CARDIOVERSION N/A 01/09/2019   Procedure: CARDIOVERSION;  Surgeon: Dalia Heading, MD;  Location: [facility];  Service: Cardiovascular;  Laterality: N/A;  . CESAREAN SECTION  1985, 1987, and 1998  . COLONOSCOPY  2011   Dr. Mechele Collin  .  ELECTROPHYSIOLOGIC STUDY N/A 12/03/2014   Procedure: Cardioversion;  Surgeon: Dalia Heading, MD;  Location: [facility];  Service: Cardiovascular;  Laterality: N/A;  . ELECTROPHYSIOLOGIC STUDY N/A 04/01/2015   Procedure: Cardioversion;  Surgeon: Dalia Heading, MD;  Location: [facility];  Service: Cardiovascular;  Laterality: N/A;  . KNEE SURGERY  2013    Family History  Problem Relation Age of Onset  . Colon cancer Mother        diagnosis age 64's  . Colon cancer Sister        diagnosis age 69  . Atrial fibrillation Sister   . Breast cancer Other        Maternal Grandmother; diagnosis age 46's  . Lung cancer Other        Paternal Grandmother; diagnosis age 63's  . Colon cancer Other        Paternal Grandfather; diagnosis age 20's  . Heart disease Father     Allergies  Allergen Reactions  . Amiodarone     Caused toxicity     CBC Latest Ref Rng & Units 02/20/2020 02/17/2020 02/14/2020  WBC 4.0 - 10.5 K/uL 8.1 7.8 9.9  Hemoglobin 12.0 - 15.0 g/dL 11.5(L) 12.5 11.0(L)  Hematocrit 36.0 - 46.0 % 36.9 40.7 36.2  Platelets 150 - 400 K/uL 172 207 229      CMP     Component Value Date/Time   NA 140 02/20/2020 0521   NA 141 11/29/2011 0432   K 3.7 02/20/2020 0521   K 4.1 11/29/2011 0432   CL 92 (L) 02/20/2020 0521   CL 106 11/29/2011 0432   CO2 39 (H) 02/20/2020 0521   CO2 30 11/29/2011 0432   GLUCOSE 108 (H) 02/20/2020 0521   GLUCOSE 121 (H) 11/29/2011 0432   BUN 19 02/20/2020 0521   BUN 11 11/29/2011 0432   CREATININE 0.88 02/20/2020 0521   CREATININE 0.90 01/19/2017 0925   CALCIUM 8.6 (L) 02/20/2020 0521   CALCIUM 8.2 (L) 11/29/2011 0432   PROT 7.3 02/12/2020 1258   ALBUMIN 3.0 (L) 02/20/2020 0521   AST 15 02/12/2020 1258   ALT 17 02/12/2020 1258   ALKPHOS 63 02/12/2020 1258   BILITOT 1.5 (H) 02/12/2020 1258   GFRNONAA >60 02/20/2020 0521   GFRNONAA >60 11/29/2011 0432   GFRAA >60 03/20/2019 1619   GFRAA >60 11/29/2011 0432     No results found.      Assessment & Plan:   1. Lymphedema  No surgery or intervention at this point in time.  The patient is doing much better since her recent hospitalization which resulted in diuresis and diagnosis of heart failure  I  have reviewed my previous discussion with the patient regarding swelling and why it  causes symptoms.  The patient is doing well with compression and will continue wearing graduated compression stockings class 1 (20-30 mmHg) on a daily basis a prescription was given. The patient will  continue wearing the stockings first thing in the morning and removing them in the evening. The patient is instructed specifically not to sleep in the stockings.    In addition, behavioral modification including elevation during the day and exercise will be continued.    Patient should follow-up on an annual basis   2. Chronic systolic congestive heart failure (HCC) This is likely driving factor of her lower extremity edema.  Patient will continue to follow with cardiology and primary care for management.   Current Outpatient Medications on File Prior to Visit  Medication Sig Dispense Refill  . albuterol (VENTOLIN HFA) 108 (90 Base) MCG/ACT inhaler INHALE 2 PUFFS INTO THE LUNGS EVERY 6 (SIX) HOURS AS NEEDED FOR WHEEZING. 6.7 each 5  . amLODipine (NORVASC) 5 MG tablet amlodipine 5 mg tablet    . apixaban (ELIQUIS) 5 MG TABS tablet Take 5 mg by mouth 2 (two) times daily.     . cyanocobalamin 2000 MCG tablet Take 2,000 mcg by mouth daily.     Marland Kitchen doxycycline (VIBRAMYCIN) 100 MG capsule Take 100 mg by mouth 2 (two) times daily.    . Fluticasone-Umeclidin-Vilant (TRELEGY ELLIPTA) 100-62.5-25 MCG/INH AEPB Inhale 1 Inhaler into the lungs daily. 180 each 4  . furosemide (LASIX) 40 MG tablet Take 2 tablets (80 mg total) by mouth daily. 180 tablet 3  . levothyroxine (SYNTHROID) 75 MCG tablet TAKE 1 TABLET BY MOUTH DAILY BEFORE BREAKFAST. WILL NEED THYROID LAB & FOLLOW-UP FOR FURTHER REFILLS (Patient taking  differently: Take 75 mcg by mouth daily before breakfast.) 90 tablet 0  . metoprolol succinate (TOPROL-XL) 25 MG 24 hr tablet Take 3 tablets (75 mg total) by mouth daily. 90 tablet 1  . Multiple Vitamins-Minerals (MULTIVITAMIN WITH MINERALS) tablet Take 1 tablet by mouth daily.    . OXYGEN Inhale 2 L/L into the lungs as needed.     . potassium chloride SA (K-DUR,KLOR-CON) 20 MEQ tablet Take 1 tablet (20 mEq total) by mouth daily. (Patient taking differently: Take 20 mEq by mouth at bedtime.) 30 tablet 6  . predniSONE (DELTASONE) 10 MG tablet TAKE 1 TABLET (10 MG TOTAL) BY MOUTH DAILY WITH BREAKFAST. 30 tablet 5  . rivaroxaban (XARELTO) 20 MG TABS tablet Xarelto 20 mg tablet    . Spacer/Aero-Holding Chambers DEVI 1 Device by Does not apply route as needed. 1 each 0  . traMADol (ULTRAM) 50 MG tablet Take 50 mg by mouth every 8 (eight) hours as needed.     No current facility-administered medications on file prior to visit.    There are no Patient Instructions on file for this visit. No follow-ups on file.   Georgiana Spinner, NP

## 2020-08-07 DIAGNOSIS — J189 Pneumonia, unspecified organism: Secondary | ICD-10-CM | POA: Diagnosis not present

## 2020-09-07 DIAGNOSIS — J189 Pneumonia, unspecified organism: Secondary | ICD-10-CM | POA: Diagnosis not present

## 2020-10-07 DIAGNOSIS — J189 Pneumonia, unspecified organism: Secondary | ICD-10-CM | POA: Diagnosis not present

## 2020-11-07 DIAGNOSIS — J189 Pneumonia, unspecified organism: Secondary | ICD-10-CM | POA: Diagnosis not present

## 2020-12-08 DIAGNOSIS — J189 Pneumonia, unspecified organism: Secondary | ICD-10-CM | POA: Diagnosis not present

## 2021-01-07 DIAGNOSIS — J189 Pneumonia, unspecified organism: Secondary | ICD-10-CM | POA: Diagnosis not present

## 2021-01-17 ENCOUNTER — Other Ambulatory Visit: Payer: Self-pay

## 2021-01-17 ENCOUNTER — Ambulatory Visit (INDEPENDENT_AMBULATORY_CARE_PROVIDER_SITE_OTHER): Payer: BC Managed Care – PPO | Admitting: Internal Medicine

## 2021-01-17 ENCOUNTER — Encounter: Payer: Self-pay | Admitting: Internal Medicine

## 2021-01-17 ENCOUNTER — Ambulatory Visit: Payer: Self-pay | Admitting: *Deleted

## 2021-01-17 VITALS — BP 122/64 | HR 89 | Temp 97.9°F | Resp 22 | Ht 62.0 in | Wt 332.5 lb

## 2021-01-17 DIAGNOSIS — J441 Chronic obstructive pulmonary disease with (acute) exacerbation: Secondary | ICD-10-CM | POA: Diagnosis not present

## 2021-01-17 MED ORDER — PREDNISONE 10 MG PO TABS: ORAL_TABLET | ORAL | 0 refills | Status: DC

## 2021-01-17 NOTE — Patient Instructions (Signed)

## 2021-01-17 NOTE — Progress Notes (Signed)
Subjective:    [name], female    DOB: 04/01/1961, 60 y.o.   MRN: [id number]  HPI  Pt presents to the clinic today with c/o runny nose, cough, chest congestion, wheezing and SOB. This started 2 days ago. She is blowing clear mucous out of her nose. The cough is productive of yellow mucous.  She denies headache, nasal congestion, ear pain, sore throat, chest pain, nausea, vomiting, diarrhea, fever, chills or body aches.  She is taken Mucinex with some relief of symptoms.  She has a history of COPD, CHF and CHRF. She is taking Trelegy, Furosemide and Albuterol as prescribed. She uses oxygen only at night. She does not have seasonal allergies. She does not smoke.  She has not had exposure to COVID that she is aware of.  She has had her COVID vaccines.  Review of Systems     Past Medical History:  Diagnosis Date   Abnormal mammogram, unspecified 2013   Atrial fibrillation (HCC)    2013   Breast screening, unspecified    CHF (congestive heart failure) (HCC)    COPD (chronic obstructive pulmonary disease) (HCC)    History of continuous positive airway pressure (CPAP) therapy at home    Hypothyroidism    Obesity, unspecified    Pneumonia    Screening for obesity    Sleep apnea    Special screening for malignant neoplasms, colon    Unspecified essential hypertension     Current Outpatient Medications  Medication Sig Dispense Refill   albuterol (VENTOLIN HFA) 108 (90 Base) MCG/ACT inhaler INHALE 2 PUFFS INTO THE LUNGS EVERY 6 (SIX) HOURS AS NEEDED FOR WHEEZING. 6.7 each 5   amLODipine (NORVASC) 5 MG tablet amlodipine 5 mg tablet     apixaban (ELIQUIS) 5 MG TABS tablet Take 5 mg by mouth 2 (two) times daily.      cyanocobalamin 2000 MCG tablet Take 2,000 mcg by mouth daily.      doxycycline (VIBRAMYCIN) 100 MG capsule Take 100 mg by mouth 2 (two) times daily.     Fluticasone-Umeclidin-Vilant (TRELEGY ELLIPTA) 100-62.5-25 MCG/INH AEPB Inhale 1 Inhaler into the lungs daily.  180 each 4   furosemide (LASIX) 40 MG tablet Take 2 tablets (80 mg total) by mouth daily. 180 tablet 3   levothyroxine (SYNTHROID) 75 MCG tablet TAKE 1 TABLET BY MOUTH DAILY BEFORE BREAKFAST. WILL NEED THYROID LAB & FOLLOW-UP FOR FURTHER REFILLS (Patient taking differently: Take 75 mcg by mouth daily before breakfast.) 90 tablet 0   metoprolol succinate (TOPROL-XL) 25 MG 24 hr tablet Take 3 tablets (75 mg total) by mouth daily. 90 tablet 1   Multiple Vitamins-Minerals (MULTIVITAMIN WITH MINERALS) tablet Take 1 tablet by mouth daily.     OXYGEN Inhale 2 L/L into the lungs as needed.      potassium chloride SA (K-DUR,KLOR-CON) 20 MEQ tablet Take 1 tablet (20 mEq total) by mouth daily. (Patient taking differently: Take 20 mEq by mouth at bedtime.) 30 tablet 6   predniSONE (DELTASONE) 10 MG tablet TAKE 1 TABLET (10 MG TOTAL) BY MOUTH DAILY WITH BREAKFAST. 30 tablet 5   rivaroxaban (XARELTO) 20 MG TABS tablet Xarelto 20 mg tablet     Spacer/Aero-Holding Chambers DEVI 1 Device by Does not apply route as needed. 1 each 0   traMADol (ULTRAM) 50 MG tablet Take 50 mg by mouth every 8 (eight) hours as needed.     No current facility-administered medications for this visit.    Allergies  Allergen Reactions   Amiodarone     Caused toxicity     Family History  Problem Relation Age of Onset   Colon cancer Mother        diagnosis age 20's   Colon cancer Sister        diagnosis age 74   Atrial fibrillation Sister    Breast cancer Other        Maternal Grandmother; diagnosis age 63's   Lung cancer Other        Paternal Grandmother; diagnosis age 45's   Colon cancer Other        Paternal Grandfather; diagnosis age 51's   Heart disease Father     Social History   Socioeconomic History   Marital status: Divorced    Spouse name: Not on file   Number of children: Not on file   Years of education: Not on file   Highest education level: Not on file  Occupational History   Not on file  Tobacco  Use   Smoking status: Passive Smoke Exposure - Never Smoker   Smokeless tobacco: Never  Vaping Use   Vaping Use: Never used  Substance and Sexual Activity   Alcohol use: No   Drug use: No   Sexual activity: Not on file  Other Topics Concern   Not on file  Social History Narrative   Not on file   Social Determinants of Health   Financial Resource Strain: Not on file  Food Insecurity: Not on file  Transportation Needs: Not on file  Physical Activity: Not on file  Stress: Not on file  Social Connections: Not on file  Intimate Partner Violence: Not on file     Constitutional: Denies fever, malaise, fatigue, headache or abrupt weight changes.  HEENT: Pt reports runny nose. Denies eye pain, eye redness, ear pain, ringing in the ears, wax buildup,nasal congestion, bloody nose, or sore throat. Respiratory: Pt reports cough and shortness of breath. Denies difficulty breathing.   Cardiovascular: Denies chest pain, chest tightness, palpitations or swelling in the hands or feet.  Gastrointestinal: Denies abdominal pain, bloating, constipation, diarrhea or blood in the stool.   No other specific complaints in a complete review of systems (except as listed in HPI above).  Objective:   Physical Exam   BP 122/64 (BP Location: Right Arm, Patient Position: Sitting, Cuff Size: Large)   Pulse 89   Temp 97.9 F (36.6 C) (Temporal)   Resp (!) 22   Ht 5\' 2"  (1.575 m)   Wt (!) 332 lb 8 oz (150.8 kg)   SpO2 100%   BMI 60.82 kg/m   Wt Readings from Last 3 Encounters:  07/27/20 (!) 341 lb (154.7 kg)  04/28/20 (!) 335 lb 8 oz (152.2 kg)  03/15/20 (!) 353 lb 2 oz (160.2 kg)    General: Appears her stated age, obese, chronically ill appearing, in NAD. HEENT: Head: normal shape and size; Eyes: sclera white, no icterus, conjunctiva pink, EOMs intact;  Throat/Mouth: hoarseness noted.  Neck: No adenopathy. Cardiovascular: Normal rate and rhythm. S1,S2 noted.  No murmur, rubs or gallops noted.   Pulmonary/Chest: Normal effort and diminished vesicular breath sounds. No respiratory distress. No wheezes, rales or ronchi noted.  Neurological: Alert and oriented.   BMET    Component Value Date/Time   NA 140 02/20/2020 0521   NA 141 11/29/2011 0432   K 3.7 02/20/2020 0521   K 4.1 11/29/2011 0432   CL 92 (L) 02/20/2020 0521   CL 106  11/29/2011 0432   CO2 39 (H) 02/20/2020 0521   CO2 30 11/29/2011 0432   GLUCOSE 108 (H) 02/20/2020 0521   GLUCOSE 121 (H) 11/29/2011 0432   BUN 19 02/20/2020 0521   BUN 11 11/29/2011 0432   CREATININE 0.88 02/20/2020 0521   CREATININE 0.90 01/19/2017 0925   CALCIUM 8.6 (L) 02/20/2020 0521   CALCIUM 8.2 (L) 11/29/2011 0432   GFRNONAA >60 02/20/2020 0521   GFRNONAA >60 11/29/2011 0432   GFRAA >60 03/20/2019 1619   GFRAA >60 11/29/2011 0432    Lipid Panel     Component Value Date/Time   CHOL 190 11/15/2016 0819   TRIG 114 11/15/2016 0819   HDL 61 11/15/2016 0819   CHOLHDL 3.1 11/15/2016 0819   VLDL 23 11/15/2016 0819   LDLCALC 106 (H) 11/15/2016 0819    CBC    Component Value Date/Time   WBC 8.1 02/20/2020 0521   RBC 3.90 02/20/2020 0521   HGB 11.5 (L) 02/20/2020 0521   HGB 9.8 (L) 11/29/2011 0432   HCT 36.9 02/20/2020 0521   HCT 38.7 11/13/2011 1448   PLT 172 02/20/2020 0521   PLT 119 (L) 11/29/2011 0432   MCV 94.6 02/20/2020 0521   MCV 82 11/13/2011 1448   MCH 29.5 02/20/2020 0521   MCHC 31.2 02/20/2020 0521   RDW 15.5 02/20/2020 0521   RDW 14.0 11/13/2011 1448   LYMPHSABS 0.3 (L) 02/12/2020 1440   MONOABS 0.5 02/12/2020 1440   EOSABS 0.0 02/12/2020 1440   BASOSABS 0.0 02/12/2020 1440    Hgb A1C Lab Results  Component Value Date   HGBA1C 5.6 11/15/2016           Assessment & Plan:   COPD Exacerbation:  No indication that this is bacterial at this time so we will hold off on antibiotics Continue Mucinex twice daily for the next 3 days Rx for Pred taper x6 days Continue inhalers as prescribed  Return  precautions discussed Nicki Reaper, NP This visit occurred during the SARS-CoV-2 public health emergency.  Safety protocols were in place, including screening questions prior to the visit, additional usage of staff PPE, and extensive cleaning of exam room while observing appropriate contact time as indicated for disinfecting solutions.

## 2021-01-17 NOTE — Telephone Encounter (Signed)
Patient was outside Friday evening and now has cough, congestion- SOB worse with COPD. Patient is using Mucinex- and had tele heath visit- was told to follow up with PCP. Appointment scheduled

## 2021-01-17 NOTE — Telephone Encounter (Signed)
Reason for Disposition  [1] Longstanding difficulty breathing (e.g., CHF, COPD, emphysema) AND [2] WORSE than normal  Answer Assessment - Initial Assessment Questions 1. RESPIRATORY STATUS: "Describe your breathing?" (e.g., wheezing, shortness of breath, unable to speak, severe coughing)      Possible bronchitis- SOB with exertion- COPD hx 2. ONSET: "When did this breathing problem begin?"      Friday night 3. PATTERN "Does the difficult breathing come and go, or has it been constant since it started?"      Coughing caused difficulty 4. SEVERITY: "How bad is your breathing?" (e.g., mild, moderate, severe)    - MILD: No SOB at rest, mild SOB with walking, speaks normally in sentences, can lie down, no retractions, pulse < 100.    - MODERATE: SOB at rest, SOB with minimal exertion and prefers to sit, cannot lie down flat, speaks in phrases, mild retractions, audible wheezing, pulse 100-120.    - SEVERE: Very SOB at rest, speaks in single words, struggling to breathe, sitting hunched forward, retractions, pulse > 120      Mild- treating with Mucinex- only using O2 at night- has not had to use it during the day 5. RECURRENT SYMPTOM: "Have you had difficulty breathing before?" If Yes, ask: "When was the last time?" and "What happened that time?"      Yes- chronic illness 6. CARDIAC HISTORY: "Do you have any history of heart disease?" (e.g., heart attack, angina, bypass surgery, angioplasty)      Afib, CHF 7. LUNG HISTORY: "Do you have any history of lung disease?"  (e.g., pulmonary embolus, asthma, emphysema)     COPD 8. CAUSE: "What do you think is causing the breathing problem?"      Bronchitis from being outside in the weather 9. OTHER SYMPTOMS: "Do you have any other symptoms? (e.g., dizziness, runny nose, cough, chest pain, fever)     Cough, sore throat 10. O2 SATURATION MONITOR:  "Do you use an oxygen saturation monitor (pulse oximeter) at home?" If Yes, "What is your reading (oxygen level)  today?" "What is your usual oxygen saturation reading?" (e.g., 95%)       O2 sat- 95% P 100 11. PREGNANCY: "Is there any chance you are pregnant?" "When was your last menstrual period?"       N/a 12. TRAVEL: "Have you traveled out of the country in the last month?" (e.g., travel history, exposures)       N/a  Protocols used: Breathing Difficulty-A-AH

## 2021-01-20 ENCOUNTER — Ambulatory Visit: Payer: Self-pay | Admitting: *Deleted

## 2021-01-20 NOTE — Telephone Encounter (Signed)
Pt seen OV appt 01/17/21 for cough, states worsening. Cough more frequent, phlegm greenish. Started prednisone taper 01/17/21, has been using Mucinex and inhaler. Has home O2, sats 93%  on room air, "Usual for me." Also reports some wheezing, expiratory. States "Maybe low grade temp last night, face flushed," not presently. Pt requesting antibiotics be called in since just seen. Assured pt NT would route to practice for PCPs review and final disposition. Advised ED for worsening symptoms. Pt verbalizes understanding.  CB# [phone]

## 2021-01-20 NOTE — Telephone Encounter (Signed)
Summary: cough and congestion   The patient has called to share that they are continuing to feel no improvement from their cough and congestion   The patient was seen on Monday 01/17/21 but shares that they feel their cough has worsened   The patient is experiencing soreness when they cough as well   The patient would like to discuss this further when possible       Reason for Disposition . [1] Known COPD or other severe lung disease (i.e., bronchiectasis, cystic fibrosis, lung surgery) AND [2] worsening symptoms (i.e., increased sputum purulence or amount, increased breathing difficulty  Answer Assessment - Initial Assessment Questions 1. ONSET: "When did the cough begin?"      Seen 01/17/21 2. SEVERITY: "How bad is the cough today?"      Worsening infrequency, phlegm now green 3. SPUTUM: "Describe the color of your sputum" (none, dry cough; clear, white, yellow, green)     greenish 4. HEMOPTYSIS: "Are you coughing up any blood?" If so ask: "How much?" (flecks, streaks, tablespoons, etc.)     no 5. DIFFICULTY BREATHING: "Are you having difficulty breathing?" If Yes, ask: "How bad is it?" (e.g., mild, moderate, severe)    - MILD: No SOB at rest, mild SOB with walking, speaks normally in sentences, can lie down, no retractions, pulse < 100.    - MODERATE: SOB at rest, SOB with minimal exertion and prefers to sit, cannot lie down flat, speaks in phrases, mild retractions, audible wheezing, pulse 100-120.    - SEVERE: Very SOB at rest, speaks in single words, struggling to breathe, sitting hunched forward, retractions, pulse > 120      No, wheezing 6. FEVER: "Do you have a fever?" If Yes, ask: "What is your temperature, how was it measured, and when did it start?"     Maybe LGT last night, not presently 7. CARDIAC HISTORY: "Do you have any history of heart disease?" (e.g., heart attack, congestive heart failure)      *No Answer* 8. LUNG HISTORY: "Do you have any history of lung disease?"   (e.g., pulmonary embolus, asthma, emphysema)     *No Answer* 9. PE RISK FACTORS: "Do you have a history of blood clots?" (or: recent major surgery, recent prolonged travel, bedridden)     *No Answer* 10. OTHER SYMPTOMS: "Do you have any other symptoms?" (e.g., runny nose, wheezing, chest pain)       Some wheezing. O2 sat 93% on room air  Protocols used: Cough - Acute Productive-A-AH

## 2021-01-21 ENCOUNTER — Other Ambulatory Visit: Payer: Self-pay | Admitting: Family Medicine

## 2021-01-21 DIAGNOSIS — J441 Chronic obstructive pulmonary disease with (acute) exacerbation: Secondary | ICD-10-CM

## 2021-01-21 MED ORDER — DOXYCYCLINE HYCLATE 100 MG PO TABS: 100.0000 mg | ORAL_TABLET | Freq: Two times a day (BID) | ORAL | 0 refills | Status: DC

## 2021-01-21 NOTE — Telephone Encounter (Signed)
Reviewed case, I have prescribed Doxycycline course for her, as this has been successful in past. She has 1 more day left of prednisone did not help much so will complete that and not repeat. Return criteria if worsening.  Katherine Pilar, DO [facility] 01/21/2021, 5:19 PM

## 2021-02-07 DIAGNOSIS — J189 Pneumonia, unspecified organism: Secondary | ICD-10-CM | POA: Diagnosis not present

## 2021-02-19 ENCOUNTER — Ambulatory Visit: Admit: 2021-02-19 | Discharge status: Against Medical Advice | Payer: Self-pay

## 2021-02-28 DIAGNOSIS — J9621 Acute and chronic respiratory failure with hypoxia: Secondary | ICD-10-CM | POA: Diagnosis not present

## 2021-02-28 DIAGNOSIS — I5032 Chronic diastolic (congestive) heart failure: Secondary | ICD-10-CM | POA: Diagnosis not present

## 2021-02-28 DIAGNOSIS — I4819 Other persistent atrial fibrillation: Secondary | ICD-10-CM | POA: Diagnosis not present

## 2021-02-28 DIAGNOSIS — G4733 Obstructive sleep apnea (adult) (pediatric): Secondary | ICD-10-CM | POA: Diagnosis not present

## 2021-03-02 IMAGING — CR DG CHEST 2V
2 series · 3 of 3 positions shown · non-contrast
Comparison: Chest x-ray dated 02/03/2019

CLINICAL DATA: Progressive shortness of breath which began 2 days
ago.

EXAM:
CHEST - 2 VIEW

[chest pa]
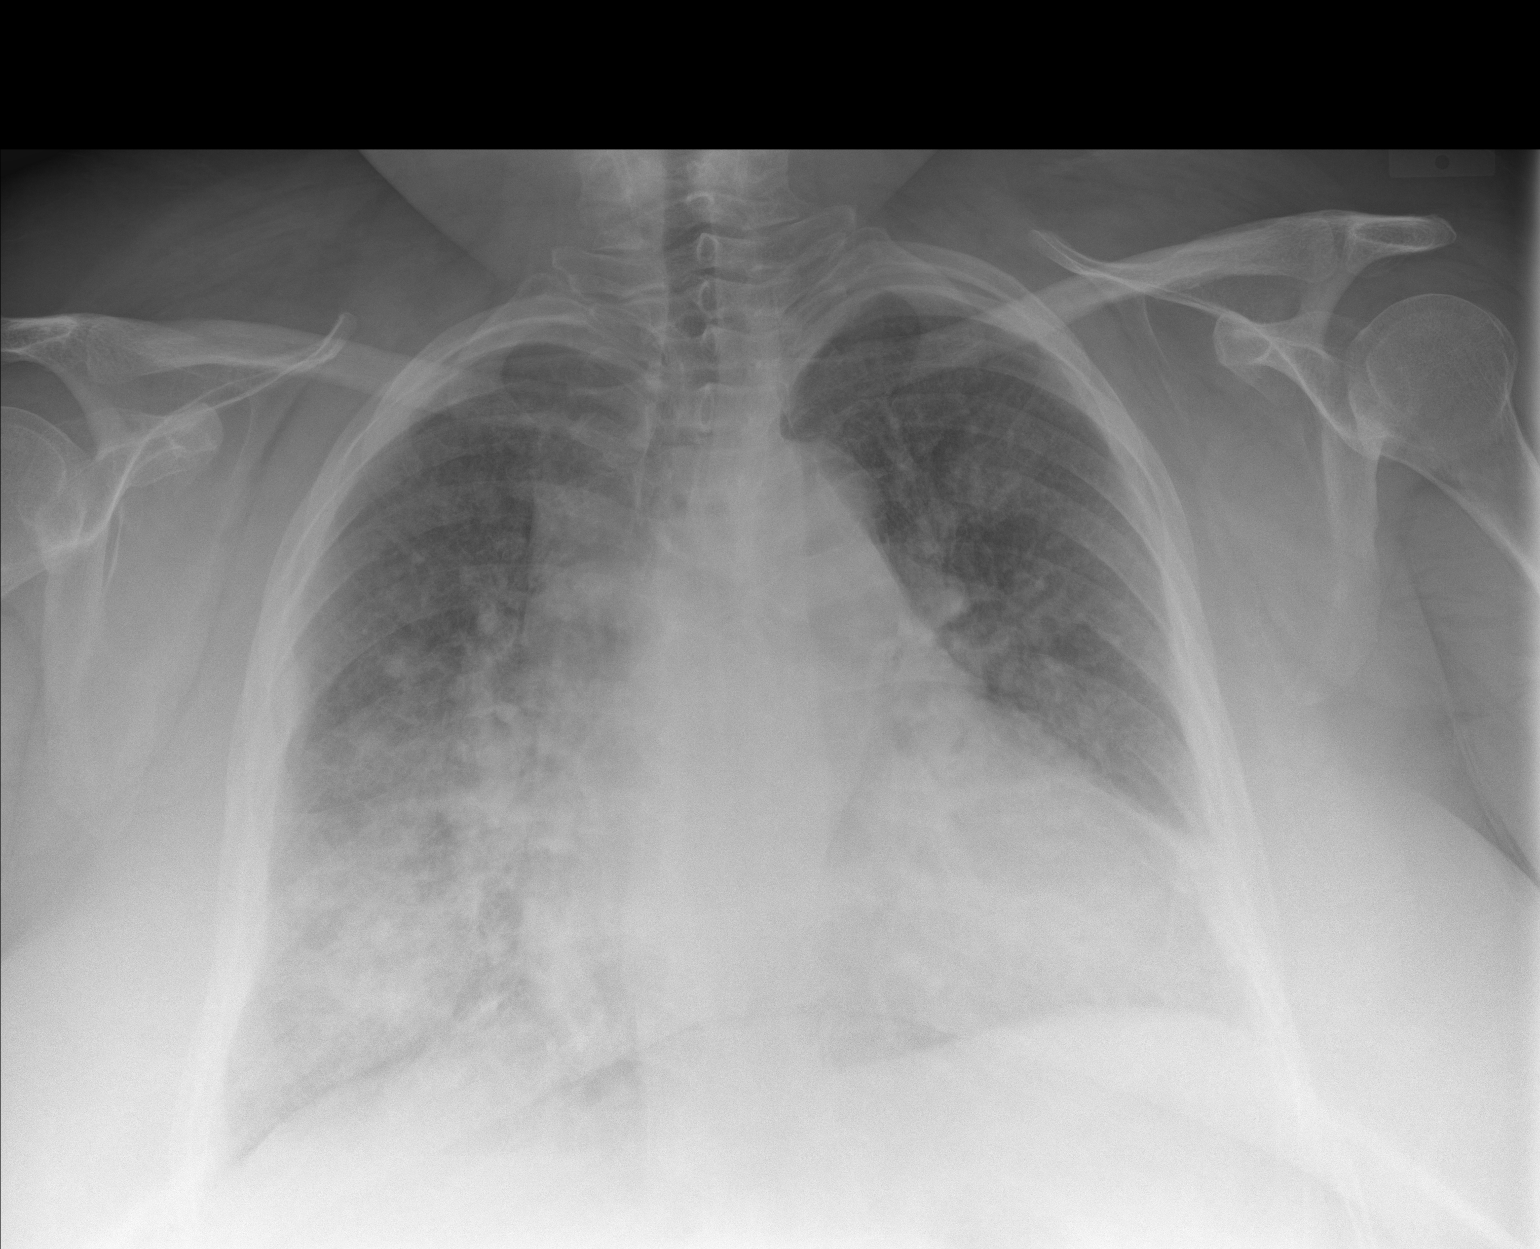

[Series 2: chest lat · 0.14mm/px · 2 of 2 slices shown]
[im 1/2]
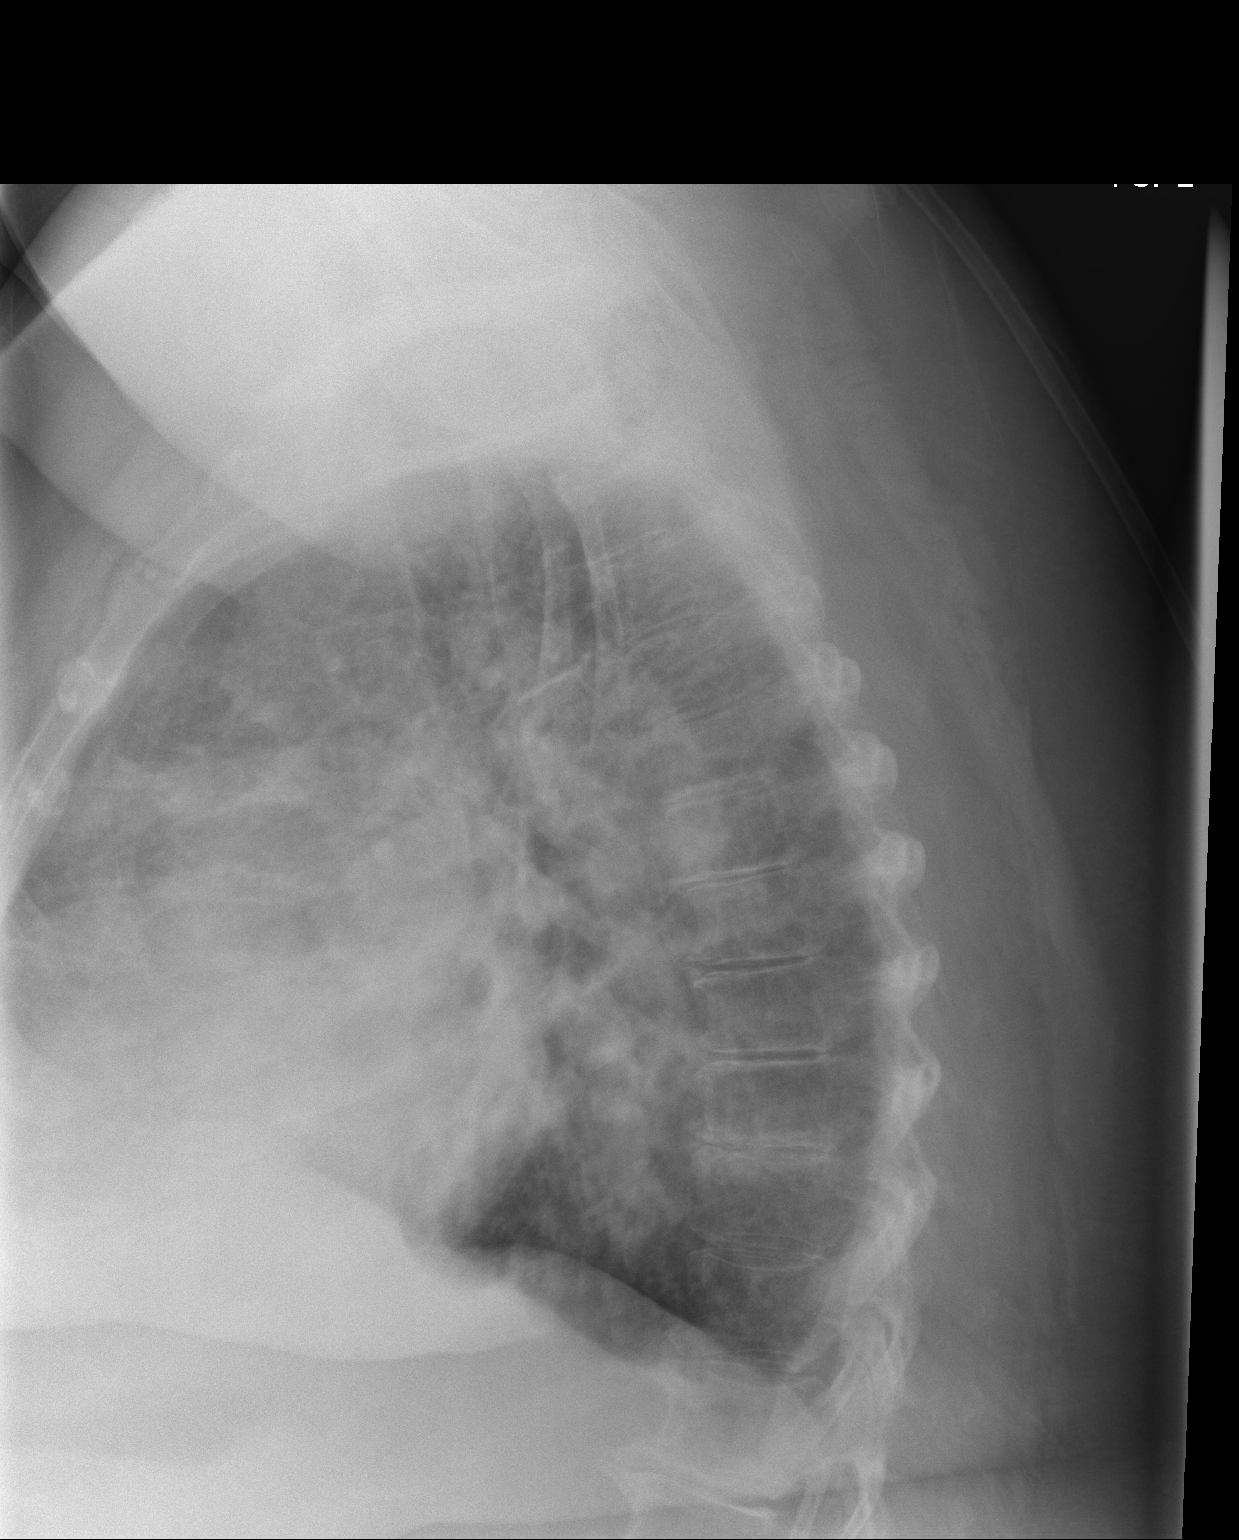
[im 2/2]
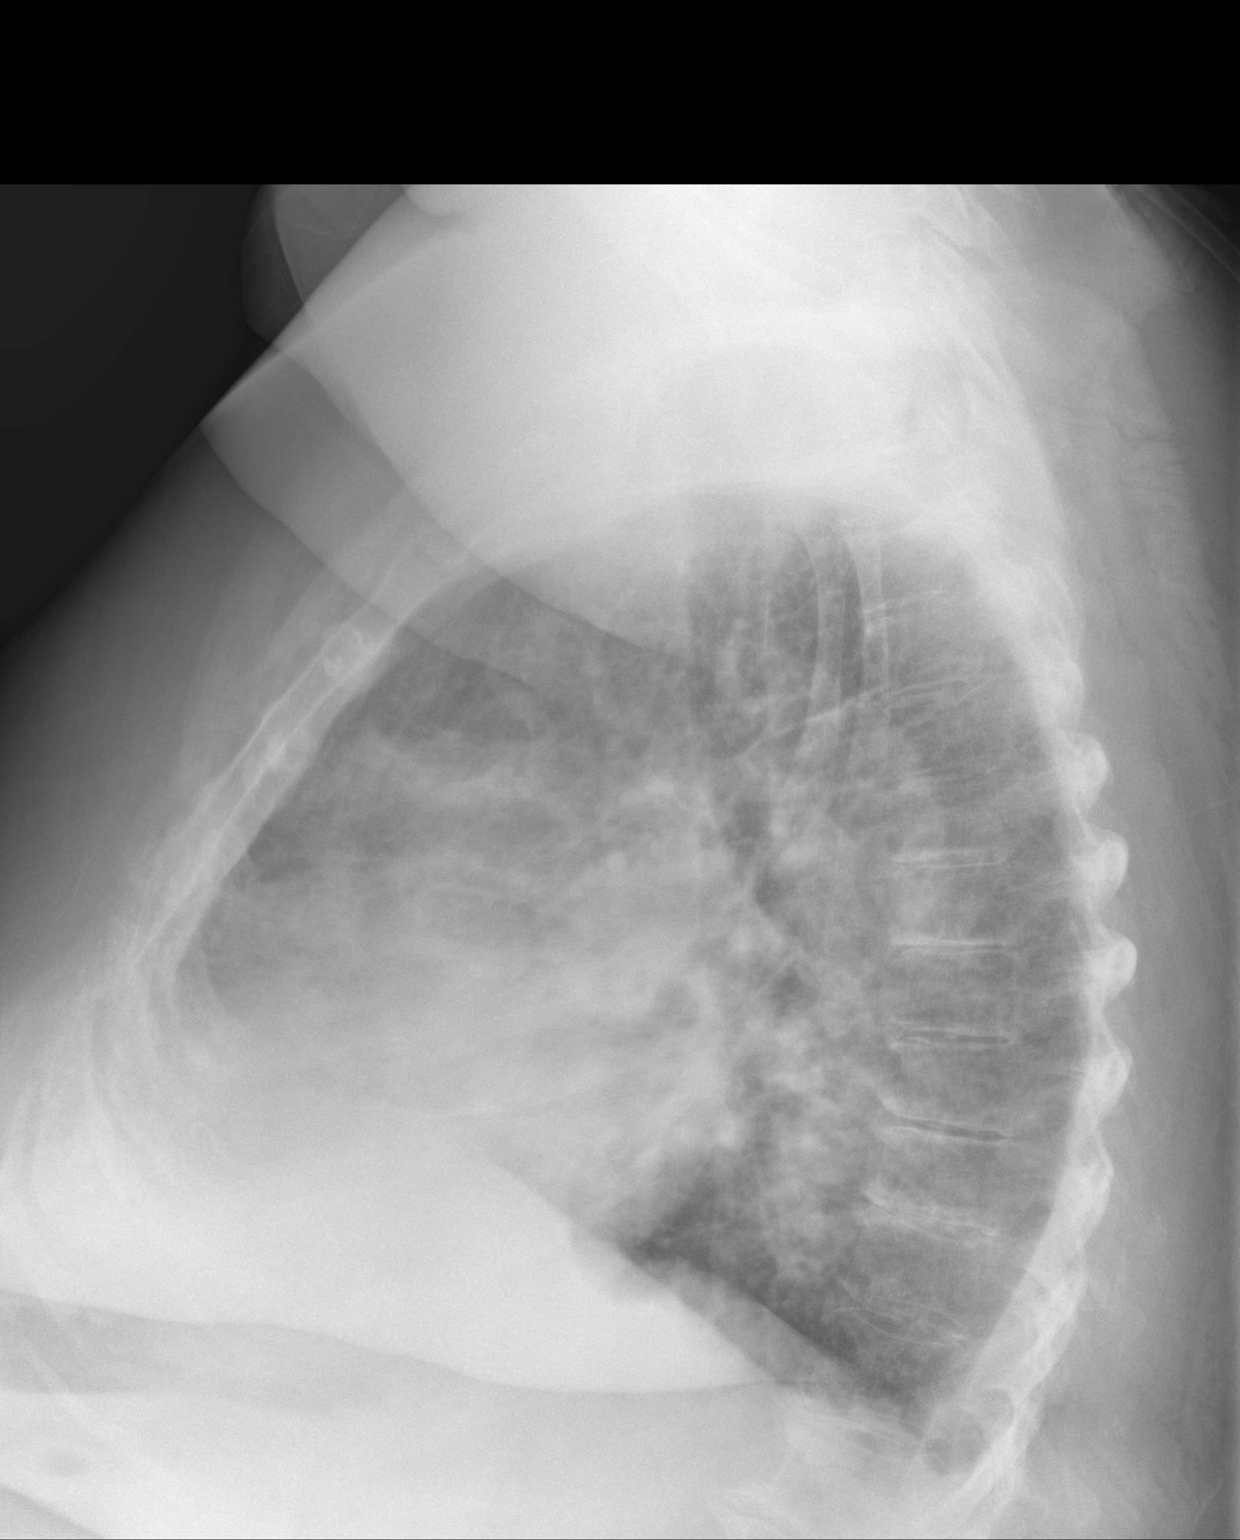

[3 of 3 positions shown; findings below may reference images not displayed]

FINDINGS: There is an extensive infiltrate in the right mid and lower lung
zones. Chronic cardiomegaly. Pulmonary vascularity is normal. No
discrete infiltrates on the left. No discrete pleural effusions. No
acute bone abnormality.
IMPRESSION: 1. Extensive infiltrate in the right mid and lower lung zones
consistent with recurrent pneumonia.
2. Chronic cardiomegaly.

## 2021-03-09 DIAGNOSIS — J189 Pneumonia, unspecified organism: Secondary | ICD-10-CM | POA: Diagnosis not present

## 2021-07-15 ENCOUNTER — Inpatient Hospital Stay: Payer: Medicaid Other

## 2021-07-15 ENCOUNTER — Encounter
Admission: EM | Disposition: A | Discharge status: Home Health | Payer: Self-pay | Source: Home / Self Care | Attending: Internal Medicine

## 2021-07-15 ENCOUNTER — Emergency Department: Payer: Medicaid Other

## 2021-07-15 ENCOUNTER — Inpatient Hospital Stay
Admission: EM | Admit: 2021-07-15 | Discharge: 2021-07-22 | DRG: 163 | Disposition: A | Discharge status: Home Health | Payer: Medicaid Other | Attending: Internal Medicine | Admitting: Internal Medicine

## 2021-07-15 ENCOUNTER — Other Ambulatory Visit: Payer: Self-pay

## 2021-07-15 ENCOUNTER — Encounter: Payer: Self-pay | Admitting: Emergency Medicine

## 2021-07-15 DIAGNOSIS — I110 Hypertensive heart disease with heart failure: Secondary | ICD-10-CM | POA: Diagnosis present

## 2021-07-15 DIAGNOSIS — Z79890 Hormone replacement therapy (postmenopausal): Secondary | ICD-10-CM

## 2021-07-15 DIAGNOSIS — Z800 Family history of malignant neoplasm of digestive organs: Secondary | ICD-10-CM | POA: Diagnosis not present

## 2021-07-15 DIAGNOSIS — J9621 Acute and chronic respiratory failure with hypoxia: Secondary | ICD-10-CM | POA: Diagnosis present

## 2021-07-15 DIAGNOSIS — I5022 Chronic systolic (congestive) heart failure: Secondary | ICD-10-CM | POA: Diagnosis present

## 2021-07-15 DIAGNOSIS — J449 Chronic obstructive pulmonary disease, unspecified: Secondary | ICD-10-CM | POA: Diagnosis present

## 2021-07-15 DIAGNOSIS — Z7951 Long term (current) use of inhaled steroids: Secondary | ICD-10-CM | POA: Diagnosis not present

## 2021-07-15 DIAGNOSIS — Z597 Insufficient social insurance and welfare support: Secondary | ICD-10-CM

## 2021-07-15 DIAGNOSIS — G629 Polyneuropathy, unspecified: Secondary | ICD-10-CM | POA: Diagnosis present

## 2021-07-15 DIAGNOSIS — Z803 Family history of malignant neoplasm of breast: Secondary | ICD-10-CM | POA: Diagnosis not present

## 2021-07-15 DIAGNOSIS — I2699 Other pulmonary embolism without acute cor pulmonale: Secondary | ICD-10-CM

## 2021-07-15 DIAGNOSIS — Z801 Family history of malignant neoplasm of trachea, bronchus and lung: Secondary | ICD-10-CM | POA: Diagnosis not present

## 2021-07-15 DIAGNOSIS — I2609 Other pulmonary embolism with acute cor pulmonale: Secondary | ICD-10-CM | POA: Diagnosis present

## 2021-07-15 DIAGNOSIS — Z79899 Other long term (current) drug therapy: Secondary | ICD-10-CM | POA: Diagnosis not present

## 2021-07-15 DIAGNOSIS — I10 Essential (primary) hypertension: Secondary | ICD-10-CM | POA: Diagnosis present

## 2021-07-15 DIAGNOSIS — Z8249 Family history of ischemic heart disease and other diseases of the circulatory system: Secondary | ICD-10-CM | POA: Diagnosis not present

## 2021-07-15 DIAGNOSIS — G4733 Obstructive sleep apnea (adult) (pediatric): Secondary | ICD-10-CM | POA: Diagnosis present

## 2021-07-15 DIAGNOSIS — I5082 Biventricular heart failure: Secondary | ICD-10-CM | POA: Diagnosis present

## 2021-07-15 DIAGNOSIS — E039 Hypothyroidism, unspecified: Secondary | ICD-10-CM | POA: Diagnosis present

## 2021-07-15 DIAGNOSIS — I4891 Unspecified atrial fibrillation: Secondary | ICD-10-CM

## 2021-07-15 DIAGNOSIS — Z9981 Dependence on supplemental oxygen: Secondary | ICD-10-CM | POA: Diagnosis not present

## 2021-07-15 DIAGNOSIS — I482 Chronic atrial fibrillation, unspecified: Secondary | ICD-10-CM | POA: Diagnosis present

## 2021-07-15 DIAGNOSIS — N179 Acute kidney failure, unspecified: Secondary | ICD-10-CM | POA: Diagnosis present

## 2021-07-15 DIAGNOSIS — R778 Other specified abnormalities of plasma proteins: Secondary | ICD-10-CM | POA: Diagnosis present

## 2021-07-15 DIAGNOSIS — Z7901 Long term (current) use of anticoagulants: Secondary | ICD-10-CM

## 2021-07-15 DIAGNOSIS — Z6841 Body Mass Index (BMI) 40.0 and over, adult: Secondary | ICD-10-CM | POA: Diagnosis not present

## 2021-07-15 DIAGNOSIS — Z888 Allergy status to other drugs, medicaments and biological substances status: Secondary | ICD-10-CM

## 2021-07-15 DIAGNOSIS — Z20822 Contact with and (suspected) exposure to covid-19: Secondary | ICD-10-CM | POA: Diagnosis present

## 2021-07-15 DIAGNOSIS — R7989 Other specified abnormal findings of blood chemistry: Secondary | ICD-10-CM

## 2021-07-15 DIAGNOSIS — I248 Other forms of acute ischemic heart disease: Secondary | ICD-10-CM | POA: Diagnosis present

## 2021-07-15 DIAGNOSIS — J189 Pneumonia, unspecified organism: Secondary | ICD-10-CM

## 2021-07-15 HISTORY — PX: PULMONARY THROMBECTOMY: CATH118295

## 2021-07-15 LAB — COMPREHENSIVE METABOLIC PANEL
ALT: 16 U/L (ref 0–44)
AST: 16 U/L (ref 15–41)
Albumin: 3.7 g/dL (ref 3.5–5.0)
Alkaline Phosphatase: 68 U/L (ref 38–126)
Anion gap: 10 (ref 5–15)
BUN: 18 mg/dL (ref 6–20)
CO2: 30 mmol/L (ref 22–32)
Calcium: 8.6 mg/dL — ABNORMAL LOW (ref 8.9–10.3)
Chloride: 99 mmol/L (ref 98–111)
Creatinine, Ser: 1.15 mg/dL — ABNORMAL HIGH (ref 0.44–1.00)
GFR, Estimated: 55 mL/min — ABNORMAL LOW (ref >60)
Glucose, Bld: 165 mg/dL — ABNORMAL HIGH (ref 70–99)
Potassium: 4.2 mmol/L (ref 3.5–5.1)
Sodium: 139 mmol/L (ref 135–145)
Total Bilirubin: 1.1 mg/dL (ref 0.3–1.2)
Total Protein: 7.8 g/dL (ref 6.5–8.1)

## 2021-07-15 LAB — PROTIME-INR
INR: 1.1 (ref 0.8–1.2)
Prothrombin Time: 14.2 seconds (ref 11.4–15.2)

## 2021-07-15 LAB — CBC WITH DIFFERENTIAL/PLATELET
Abs Immature Granulocytes: 0.05 10*3/uL (ref 0.00–0.07)
Basophils Absolute: 0.1 10*3/uL (ref 0.0–0.1)
Basophils Relative: 1 %
Eosinophils Absolute: 0.1 10*3/uL (ref 0.0–0.5)
Eosinophils Relative: 1 %
HCT: 41.3 % (ref 36.0–46.0)
Hemoglobin: 12.6 g/dL (ref 12.0–15.0)
Immature Granulocytes: 1 %
Lymphocytes Relative: 10 %
Lymphs Abs: 1 10*3/uL (ref 0.7–4.0)
MCH: 29.8 pg (ref 26.0–34.0)
MCHC: 30.5 g/dL (ref 30.0–36.0)
MCV: 97.6 fL (ref 80.0–100.0)
Monocytes Absolute: 0.7 10*3/uL (ref 0.1–1.0)
Monocytes Relative: 6 %
Neutro Abs: 9.1 10*3/uL — ABNORMAL HIGH (ref 1.7–7.7)
Neutrophils Relative %: 81 %
Platelets: 174 10*3/uL (ref 150–400)
RBC: 4.23 MIL/uL (ref 3.87–5.11)
RDW: 14.2 % (ref 11.5–15.5)
WBC: 11 10*3/uL — ABNORMAL HIGH (ref 4.0–10.5)
nRBC: 0 % (ref 0.0–0.2)

## 2021-07-15 LAB — BLOOD GAS, VENOUS
Acid-Base Excess: 10.8 mmol/L — ABNORMAL HIGH (ref 0.0–2.0)
Bicarbonate: 37.6 mmol/L — ABNORMAL HIGH (ref 20.0–28.0)
O2 Saturation: 44.9 %
Patient temperature: 37
pCO2, Ven: 58 mmHg (ref 44–60)
pH, Ven: 7.42 (ref 7.25–7.43)

## 2021-07-15 LAB — BRAIN NATRIURETIC PEPTIDE: B Natriuretic Peptide: 217.4 pg/mL — ABNORMAL HIGH (ref 0.0–100.0)

## 2021-07-15 LAB — TROPONIN I (HIGH SENSITIVITY)
Troponin I (High Sensitivity): 79 ng/L — ABNORMAL HIGH (ref <18)
Troponin I (High Sensitivity): 88 ng/L — ABNORMAL HIGH (ref <18)
Troponin I (High Sensitivity): 88 ng/L — ABNORMAL HIGH (ref <18)

## 2021-07-15 LAB — RESP PANEL BY RT-PCR (FLU A&B, COVID) ARPGX2
Influenza A by PCR: NEGATIVE
Influenza B by PCR: NEGATIVE
SARS Coronavirus 2 by RT PCR: NEGATIVE

## 2021-07-15 LAB — PROCALCITONIN: Procalcitonin: <0.1 ng/mL

## 2021-07-15 LAB — MRSA NEXT GEN BY PCR, NASAL: MRSA by PCR Next Gen: NOT DETECTED

## 2021-07-15 LAB — APTT
aPTT: 29 seconds (ref 24–36)
aPTT: 49 seconds — ABNORMAL HIGH (ref 24–36)

## 2021-07-15 LAB — LACTIC ACID, PLASMA: Lactic Acid, Venous: 1.4 mmol/L (ref 0.5–1.9)

## 2021-07-15 LAB — D-DIMER, QUANTITATIVE: D-Dimer, Quant: 1.91 ug/mL-FEU — ABNORMAL HIGH (ref 0.00–0.50)

## 2021-07-15 LAB — HIV ANTIBODY (ROUTINE TESTING W REFLEX): HIV Screen 4th Generation wRfx: NONREACTIVE

## 2021-07-15 LAB — HEPARIN LEVEL (UNFRACTIONATED): Heparin Unfractionated: 0.27 IU/mL — ABNORMAL LOW (ref 0.30–0.70)

## 2021-07-15 LAB — TSH: TSH: 2.993 u[IU]/mL (ref 0.350–4.500)

## 2021-07-15 LAB — MAGNESIUM: Magnesium: 1.9 mg/dL (ref 1.7–2.4)

## 2021-07-15 SURGERY — PULMONARY THROMBECTOMY
Anesthesia: Moderate Sedation | Laterality: Bilateral

## 2021-07-15 MED ORDER — HYDRALAZINE HCL 20 MG/ML IJ SOLN: 5.0000 mg | INTRAMUSCULAR | Status: DC | PRN

## 2021-07-15 MED ORDER — IPRATROPIUM-ALBUTEROL 0.5-2.5 (3) MG/3ML IN SOLN
3.0000 mL | RESPIRATORY_TRACT | Status: DC
Start: 2021-07-15 — End: 2021-07-19
  Administered 2021-07-15 – 2021-07-19 (×24): 3 mL via RESPIRATORY_TRACT
  Filled 2021-07-15 (×24): qty 3

## 2021-07-15 MED ORDER — HEPARIN (PORCINE) 25000 UT/250ML-% IV SOLN
2300.0000 [IU]/h | INTRAVENOUS | Status: DC
Start: 2021-07-15 — End: 2021-07-20
  Administered 2021-07-15: 1600 [IU]/h via INTRAVENOUS
  Administered 2021-07-16 – 2021-07-18 (×4): 1650 [IU]/h via INTRAVENOUS
  Administered 2021-07-18: 1850 [IU]/h via INTRAVENOUS
  Administered 2021-07-19 (×2): 2000 [IU]/h via INTRAVENOUS
  Administered 2021-07-20: 2300 [IU]/h via INTRAVENOUS
  Filled 2021-07-15 (×10): qty 250

## 2021-07-15 MED ORDER — DILTIAZEM HCL ER COATED BEADS 240 MG PO CP24
240.0000 mg | ORAL_CAPSULE | Freq: Every day | ORAL | Status: DC
  Administered 2021-07-15 – 2021-07-22 (×8): 240 mg via ORAL
  Filled 2021-07-15 (×9): qty 1

## 2021-07-15 MED ORDER — SODIUM CHLORIDE 0.9 % IV SOLN
500.0000 mg | Freq: Once | INTRAVENOUS | Status: AC
  Administered 2021-07-15: 500 mg via INTRAVENOUS
  Filled 2021-07-15: qty 5

## 2021-07-15 MED ORDER — SODIUM CHLORIDE 0.9 % IV SOLN: 2.0000 g | INTRAVENOUS | Status: DC

## 2021-07-15 MED ORDER — SODIUM CHLORIDE 0.9 % IV SOLN
1.0000 g | Freq: Once | INTRAVENOUS | Status: AC
  Administered 2021-07-15: 1 g via INTRAVENOUS
  Filled 2021-07-15: qty 10

## 2021-07-15 MED ORDER — MIDAZOLAM HCL 2 MG/2ML IJ SOLN
INTRAMUSCULAR | Status: DC | PRN
Start: 2021-07-15 — End: 2021-07-15
  Administered 2021-07-15: 1 mg via INTRAVENOUS

## 2021-07-15 MED ORDER — IODIXANOL 320 MG/ML IV SOLN
INTRAVENOUS | Status: DC | PRN
  Administered 2021-07-15: 80 mL via INTRAVENOUS

## 2021-07-15 MED ORDER — METHYLPREDNISOLONE SODIUM SUCC 125 MG IJ SOLR
125.0000 mg | Freq: Once | INTRAMUSCULAR | Status: AC
  Administered 2021-07-15: 125 mg via INTRAVENOUS
  Filled 2021-07-15: qty 2

## 2021-07-15 MED ORDER — MOMETASONE FURO-FORMOTEROL FUM 200-5 MCG/ACT IN AERO
2.0000 | INHALATION_SPRAY | Freq: Two times a day (BID) | RESPIRATORY_TRACT | Status: DC
Start: 2021-07-15 — End: 2021-07-15

## 2021-07-15 MED ORDER — ASPIRIN 81 MG PO CHEW
324.0000 mg | CHEWABLE_TABLET | Freq: Once | ORAL | Status: AC
  Administered 2021-07-15: 324 mg via ORAL
  Filled 2021-07-15: qty 4

## 2021-07-15 MED ORDER — DILTIAZEM LOAD VIA INFUSION
10.0000 mg | Freq: Once | INTRAVENOUS | Status: AC
  Administered 2021-07-15: 10 mg via INTRAVENOUS
  Filled 2021-07-15: qty 10

## 2021-07-15 MED ORDER — IOHEXOL 350 MG/ML SOLN
75.0000 mL | Freq: Once | INTRAVENOUS | Status: AC | PRN
  Administered 2021-07-15: 75 mL via INTRAVENOUS

## 2021-07-15 MED ORDER — FENTANYL CITRATE (PF) 100 MCG/2ML IJ SOLN
INTRAMUSCULAR | Status: DC | PRN
  Administered 2021-07-15: 50 ug via INTRAVENOUS

## 2021-07-15 MED ORDER — SODIUM CHLORIDE 0.9 % IV SOLN: 500.0000 mg | INTRAVENOUS | Status: DC

## 2021-07-15 MED ORDER — ONDANSETRON HCL 4 MG/2ML IJ SOLN: 4.0000 mg | Freq: Three times a day (TID) | INTRAMUSCULAR | Status: DC | PRN

## 2021-07-15 MED ORDER — ALBUTEROL SULFATE (2.5 MG/3ML) 0.083% IN NEBU
2.5000 mg | INHALATION_SOLUTION | RESPIRATORY_TRACT | Status: DC | PRN
Start: 2021-07-15 — End: 2021-07-22

## 2021-07-15 MED ORDER — IPRATROPIUM-ALBUTEROL 0.5-2.5 (3) MG/3ML IN SOLN
9.0000 mL | Freq: Once | RESPIRATORY_TRACT | Status: AC
Start: 2021-07-15 — End: 2021-07-15
  Administered 2021-07-15: 9 mL via RESPIRATORY_TRACT
  Filled 2021-07-15: qty 9

## 2021-07-15 MED ORDER — DILTIAZEM HCL-DEXTROSE 125-5 MG/125ML-% IV SOLN (PREMIX)
5.0000 mg/h | INTRAVENOUS | Status: DC
  Administered 2021-07-15: 5 mg/h via INTRAVENOUS
  Filled 2021-07-15: qty 125

## 2021-07-15 MED ORDER — FENTANYL CITRATE (PF) 100 MCG/2ML IJ SOLN
INTRAMUSCULAR | Status: AC
  Filled 2021-07-15: qty 2

## 2021-07-15 MED ORDER — CHLORHEXIDINE GLUCONATE CLOTH 2 % EX PADS
6.0000 | MEDICATED_PAD | Freq: Every day | CUTANEOUS | Status: DC
  Administered 2021-07-16 – 2021-07-22 (×6): 6 via TOPICAL

## 2021-07-15 MED ORDER — ACETAMINOPHEN 325 MG PO TABS
650.0000 mg | ORAL_TABLET | Freq: Four times a day (QID) | ORAL | Status: DC | PRN
  Administered 2021-07-18: 650 mg via ORAL
  Filled 2021-07-15: qty 2

## 2021-07-15 MED ORDER — HEPARIN BOLUS VIA INFUSION
5000.0000 [IU] | Freq: Once | INTRAVENOUS | Status: AC
  Administered 2021-07-15: 5000 [IU] via INTRAVENOUS
  Filled 2021-07-15: qty 5000

## 2021-07-15 MED ORDER — MIDAZOLAM HCL 2 MG/2ML IJ SOLN
INTRAMUSCULAR | Status: AC
Start: 2021-07-15 — End: 2021-07-16
  Filled 2021-07-15: qty 2

## 2021-07-15 MED ORDER — DILTIAZEM HCL ER 240 MG PO CP24: 240.0000 mg | ORAL_CAPSULE | Freq: Every day | ORAL | Status: DC

## 2021-07-15 MED ORDER — FUROSEMIDE 20 MG PO TABS
80.0000 mg | ORAL_TABLET | Freq: Every day | ORAL | Status: DC
  Administered 2021-07-15 – 2021-07-17 (×3): 80 mg via ORAL
  Filled 2021-07-15 (×3): qty 4

## 2021-07-15 MED ORDER — DM-GUAIFENESIN ER 30-600 MG PO TB12
1.0000 | ORAL_TABLET | Freq: Two times a day (BID) | ORAL | Status: DC | PRN
  Filled 2021-07-15: qty 1

## 2021-07-15 MED ORDER — ALTEPLASE 2 MG IJ SOLR
INTRAMUSCULAR | Status: AC
  Filled 2021-07-15: qty 8

## 2021-07-15 MED ORDER — SODIUM CHLORIDE 0.9 % IV SOLN: INTRAVENOUS | Status: DC

## 2021-07-15 MED ORDER — MOMETASONE FURO-FORMOTEROL FUM 200-5 MCG/ACT IN AERO
2.0000 | INHALATION_SPRAY | Freq: Two times a day (BID) | RESPIRATORY_TRACT | Status: DC
  Administered 2021-07-16 – 2021-07-22 (×12): 2 via RESPIRATORY_TRACT
  Filled 2021-07-15 (×2): qty 8.8

## 2021-07-15 SURGICAL SUPPLY — 14 items
CANISTER PENUMBRA ENGINE (MISCELLANEOUS) ×1 IMPLANT
CATH INDIGO 12XTORQ 100 (CATHETERS) ×1 IMPLANT
CATH INDIGO SEP 12 (CATHETERS) ×1 IMPLANT
CATH INFINITI JR4 5F (CATHETERS) ×1 IMPLANT
CATH SELECT BERN TIP 5F 130 (CATHETERS) ×1 IMPLANT
COVER PROBE U/S 5X48 (MISCELLANEOUS) ×1 IMPLANT
GLIDEWIRE ADV .035X260CM (WIRE) ×1 IMPLANT
PACK ANGIOGRAPHY (CUSTOM PROCEDURE TRAY) ×2 IMPLANT
SHEATH BRITE TIP 5FRX11 (SHEATH) ×1 IMPLANT
SHEATH BRITE TIP 8FRX11 (SHEATH) ×1 IMPLANT
SHEATH PINNACLE 11FRX10 (SHEATH) ×1 IMPLANT
SYR MEDRAD MARK 7 150ML (SYRINGE) ×1 IMPLANT
TUBING CONTRAST HIGH PRESS 72 (TUBING) ×1 IMPLANT
WIRE GUIDERIGHT .035X150 (WIRE) ×1 IMPLANT

## 2021-07-15 NOTE — Progress Notes (Signed)
ANTICOAGULATION CONSULT NOTE - Initial Consult ? ?Pharmacy Consult for Heparin drip ?Indication: pulmonary embolus ? ?Allergies  ?Allergen Reactions  ? Amiodarone   ?  Caused toxicity   ? ? ?Patient Measurements: ?Height: 5\' 2"  (157.5 cm) ?Weight: (!) 164.9 kg (363 lb 8.6 oz) ?IBW/kg (Calculated) : 50.1 ?Heparin Dosing Weight: 93 kg ? ?Vital Signs: ?Temp: 98.8 ?F (37.1 ?C) (04/07 02-26-1998) ?Temp Source: Axillary (04/07 0752) ?BP: 113/81 (04/07 1130) ?Pulse Rate: 105 (04/07 1130) ? ?Labs: ?Recent Labs  ?  07/15/21 ?0748 07/15/21 ?0935  ?HGB 12.6  --   ?HCT 41.3  --   ?PLT 174  --   ?APTT 29  --   ?LABPROT 14.2  --   ?INR 1.1  --   ?CREATININE 1.15*  --   ?TROPONINIHS 79* 88*  ? ? ?Estimated Creatinine Clearance: 78.8 mL/min (A) (by C-G formula based on SCr of 1.15 mg/dL (H)). ? ? ?Medical History: ?Past Medical History:  ?Diagnosis Date  ? Abnormal mammogram, unspecified 2013  ? Atrial fibrillation (HCC)   ? 2013  ? Breast screening, unspecified   ? CHF (congestive heart failure) (HCC)   ? COPD (chronic obstructive pulmonary disease) (HCC)   ? History of continuous positive airway pressure (CPAP) therapy at home   ? Hypothyroidism   ? Obesity, unspecified   ? Pneumonia   ? Screening for obesity   ? Sleep apnea   ? Special screening for malignant neoplasms, colon   ? Unspecified essential hypertension   ? ? ?Medications:  ?(Not in a hospital admission)  ?Scheduled:  ? ipratropium-albuterol  3 mL Nebulization Q4H  ? ?Infusions:  ? azithromycin (ZITHROMAX) 500 MG IVPB (Vial-Mate [employer]) 500 mg (07/15/21 1145)  ? [START ON 07/16/2021] azithromycin    ? [START ON 07/16/2021] cefTRIAXone (ROCEPHIN)  IV    ? diltiazem (CARDIZEM) infusion 5 mg/hr (07/15/21 1149)  ? heparin 1,600 Units/hr (07/15/21 1152)  ? ? ?Assessment: ?61 yo F to start Heparin drip for PE+. ?Hx of Afib- pt was on Xarelto then changed to apixaban ,..but has not been taking for a couple of months due to cost (per ED MD note/discussion w/ Med Rec Tech) ?Hgb 12.6   plt 174  INR 1.1  aPTT 29 ? ? ? ?Goal of Therapy:  ?Heparin level 0.3-0.7 units/ml ?Monitor platelets by anticoagulation protocol: Yes ?  ?Plan:  ?Give 5000 units bolus x 1 ?Start heparin infusion at 1600 units/hr ?Check anti-Xa level in 6 hours and daily while on heparin ?Continue to monitor H&H and platelets ? ?Maurine Mowbray A ?07/15/2021,12:06 PM ? ? ?

## 2021-07-15 NOTE — Consult Note (Signed)
?[facility] ?Vascular Consult Note ? ?MRN : [id number] ? ?Katherine Scott is a 61 y.o. (1960/04/17) female who presents with chief complaint of  ?Chief Complaint  ?Patient presents with  ? Respiratory Distress  ?. ? ? ?Consulting Physician:Xilin Clyde LundborgNiu, MD ?Reason for consult: Pulmonary embolism ?History of Present Illness: Katherine Scott is a 61 year old female that is well-known to our practice with a longstanding history of lymphedema.  She notes that her lower extremity lymphedema has been under good control recently but she began to notice that she was having worsening shortness of breath over the last week.  She notes that finally today the shortness of breath reached a significantly worse point so she presented to the emergency room.  She also has a significant medical history of hypertension, COPD, HFrEF with an estimated EF of 45 to 50% with atrial fibrillation.  The patient was previously on Eliquis but notes that she stopped approximately 2 months ago due to cost.  She denies any traumatic incident or travel recently.  She denies any chest pain with no nausea, vomiting, diarrhea or abdominal pain.  She denies any TIA-like or CVA like symptoms. ? ?Current Facility-Administered Medications  ?Medication Dose Route Frequency Provider Last Rate Last Admin  ? acetaminophen (TYLENOL) tablet 650 mg  650 mg Oral Q6H PRN Lorretta HarpNiu, Xilin, MD      ? albuterol (PROVENTIL) (2.5 MG/3ML) 0.083% nebulizer solution 2.5 mg  2.5 mg Nebulization Q4H PRN Lorretta HarpNiu, Xilin, MD      ? azithromycin (ZITHROMAX) 500 mg in sodium chloride 0.9 % 250 mL IVPB  500 mg Intravenous Once Gilles ChiquitoSmith, Zachary P, MD 250 mL/hr at 07/15/21 1145 500 mg at 07/15/21 1145  ? dextromethorphan-guaiFENesin (MUCINEX DM) 30-600 MG per 12 hr tablet 1 tablet  1 tablet Oral BID PRN Lorretta HarpNiu, Xilin, MD      ? diltiazem (CARDIZEM) 125 mg in dextrose 5% 125 mL (1 mg/mL) infusion  5-15 mg/hr Intravenous Continuous Gilles ChiquitoSmith, Zachary P, MD 5 mL/hr at 07/15/21 1149 5 mg/hr  at 07/15/21 1149  ? furosemide (LASIX) tablet 80 mg  80 mg Oral Daily Lorretta HarpNiu, Xilin, MD      ? heparin ADULT infusion 100 units/mL (25000 units/25550mL)  1,600 Units/hr Intravenous Continuous Angelique BlonderMerrill, Kristin A, RPH 16 mL/hr at 07/15/21 1152 1,600 Units/hr at 07/15/21 1152  ? hydrALAZINE (APRESOLINE) injection 5 mg  5 mg Intravenous Q2H PRN Lorretta HarpNiu, Xilin, MD      ? ipratropium-albuterol (DUONEB) 0.5-2.5 (3) MG/3ML nebulizer solution 3 mL  3 mL Nebulization Q4H Lorretta HarpNiu, Xilin, MD   3 mL at 07/15/21 1139  ? [START ON 07/16/2021] mometasone-formoterol (DULERA) 200-5 MCG/ACT inhaler 2 puff  2 puff Inhalation BID Lorretta HarpNiu, Xilin, MD      ? ondansetron [facility](ZOFRAN) injection 4 mg  4 mg Intravenous Q8H PRN Lorretta HarpNiu, Xilin, MD      ? ?Current Outpatient Medications  ?Medication Sig Dispense Refill  ? diltiazem (DILACOR XR) 240 MG 24 hr capsule Take 240 mg by mouth daily.    ? fluticasone-salmeterol (ADVAIR) 250-50 MCG/ACT AEPB Inhale 1 puff into the lungs in the morning and at bedtime.    ? furosemide (LASIX) 40 MG tablet Take 2 tablets (80 mg total) by mouth daily. 180 tablet 3  ? potassium chloride SA (K-DUR,KLOR-CON) 20 MEQ tablet Take 1 tablet (20 mEq total) by mouth daily. (Patient taking differently: Take 20 mEq by mouth at bedtime.) 30 tablet 6  ? albuterol (VENTOLIN HFA) 108 (90 Base) MCG/ACT inhaler INHALE 2  PUFFS INTO THE LUNGS EVERY 6 (SIX) HOURS AS NEEDED FOR WHEEZING. 6.7 each 5  ? amLODipine (NORVASC) 5 MG tablet amlodipine 5 mg tablet (Patient not taking: Reported on 01/17/2021)    ? cyanocobalamin 2000 MCG tablet Take 2,000 mcg by mouth daily.  (Patient not taking: Reported on 07/15/2021)    ? doxycycline (VIBRA-TABS) 100 MG tablet Take 1 tablet (100 mg total) by mouth 2 (two) times daily. For 10 days. Take with full glass of water, stay upright 30 min after taking. (Patient not taking: Reported on 07/15/2021) 20 tablet 0  ? Fluticasone-Umeclidin-Vilant (TRELEGY ELLIPTA) 100-62.5-25 MCG/INH AEPB Inhale 1 Inhaler into the lungs daily.  (Patient not taking: Reported on 07/15/2021) 180 each 4  ? levothyroxine (SYNTHROID) 75 MCG tablet TAKE 1 TABLET BY MOUTH DAILY BEFORE BREAKFAST. WILL NEED THYROID LAB & FOLLOW-UP FOR FURTHER REFILLS (Patient not taking: Reported on 07/15/2021) 90 tablet 0  ? metoprolol succinate (TOPROL-XL) 25 MG 24 hr tablet Take 3 tablets (75 mg total) by mouth daily. (Patient not taking: Reported on 07/15/2021) 90 tablet 1  ? OXYGEN Inhale 2 L/L into the lungs as needed.     ? predniSONE (DELTASONE) 10 MG tablet Take 6 tabs on day 1, 5 tabs on day 2, 4 tabs on day 3, 3 tabs on day 4, 2 tabs on day 5, 1 tab on day 6 (Patient not taking: Reported on 07/15/2021) 21 tablet 0  ? rivaroxaban (XARELTO) 20 MG TABS tablet Xarelto 20 mg tablet (Patient not taking: Reported on 07/15/2021)    ? Spacer/Aero-Holding Chambers DEVI 1 Device by Does not apply route as needed. 1 each 0  ? ? ?Past Medical History:  ?Diagnosis Date  ? Abnormal mammogram, unspecified 2013  ? Atrial fibrillation (HCC)   ? 2013  ? Breast screening, unspecified   ? CHF (congestive heart failure) (HCC)   ? COPD (chronic obstructive pulmonary disease) (HCC)   ? History of continuous positive airway pressure (CPAP) therapy at home   ? Hypothyroidism   ? Obesity, unspecified   ? Pneumonia   ? Screening for obesity   ? Sleep apnea   ? Special screening for malignant neoplasms, colon   ? Unspecified essential hypertension   ? ? ?Past Surgical History:  ?Procedure Laterality Date  ? ABDOMINAL HYSTERECTOMY  2011  ? ANTERIOR CRUCIATE LIGAMENT REPAIR  2003  ? BREAST BIOPSY Left January 22, 2012  ? left breast stereotactic biopsy showed fibroadenomatous changes with microcalcifications, fat necrosis, and sclerosing adenosis and columnar cell changes. No evidence of atypia or malignancy.  ? CARDIOVERSION N/A 09/22/2016  ? Procedure: CARDIOVERSION;  Surgeon: Dalia Heading, MD;  Location: [facility];  Service: Cardiovascular;  Laterality: N/A;  ? CARDIOVERSION N/A 01/17/2018  ? Procedure:  CARDIOVERSION;  Surgeon: Dalia Heading, MD;  Location: [facility];  Service: Cardiovascular;  Laterality: N/A;  ? CARDIOVERSION N/A 01/09/2019  ? Procedure: CARDIOVERSION;  Surgeon: Dalia Heading, MD;  Location: [facility];  Service: Cardiovascular;  Laterality: N/A;  ? CESAREAN SECTION  1985, 1987, and 1998  ? COLONOSCOPY  2011  ? Dr. Mechele Collin  ? ELECTROPHYSIOLOGIC STUDY N/A 12/03/2014  ? Procedure: Cardioversion;  Surgeon: Dalia Heading, MD;  Location: [facility];  Service: Cardiovascular;  Laterality: N/A;  ? ELECTROPHYSIOLOGIC STUDY N/A 04/01/2015  ? Procedure: Cardioversion;  Surgeon: Dalia Heading, MD;  Location: [facility];  Service: Cardiovascular;  Laterality: N/A;  ? KNEE SURGERY  2013  ? ? ?Social History ?Social History  ? ?  Tobacco Use  ? Smoking status: Never  ?  Passive exposure: Yes  ? Smokeless tobacco: Never  ?Vaping Use  ? Vaping Use: Never used  ?Substance Use Topics  ? Alcohol use: No  ? Drug use: No  ? ? ?Family History ?Family History  ?Problem Relation Age of Onset  ? Colon cancer Mother   ?     diagnosis age 50's  ? Colon cancer Sister   ?     diagnosis age 46  ? Atrial fibrillation Sister   ? Breast cancer Other   ?     Maternal Grandmother; diagnosis age 2's  ? Lung cancer Other   ?     Paternal Grandmother; diagnosis age 48's  ? Colon cancer Other   ?     Paternal Grandfather; diagnosis age 61's  ? Heart disease Father   ? ? ?Allergies  ?Allergen Reactions  ? Amiodarone   ?  Caused toxicity   ? ? ? ?REVIEW OF SYSTEMS (Negative unless checked) ? ?Constitutional: [] Weight loss  [] Fever  [] Chills ?Cardiac: [] Chest pain   [] Chest pressure   [] Palpitations   [] Shortness of breath when laying flat   [x] Shortness of breath at rest   [] Shortness of breath with exertion. ?Vascular:  [] Pain in legs with walking   [] Pain in legs at rest   [] Pain in legs when laying flat   [] Claudication   [] Pain in feet when walking  [] Pain in feet at rest  [] Pain in feet when laying flat   [] History of DVT   [] Phlebitis    [] Swelling in legs   [] Varicose veins   [] Non-healing ulcers ?Pulmonary:   [] Uses home oxygen   [] Productive cough   [] Hemoptysis   [] Wheeze  [] COPD   [] Asthma ?Neurologic:  [] Dizziness  [] Blackouts   [] Se

## 2021-07-15 NOTE — Progress Notes (Signed)
ANTICOAGULATION CONSULT NOTE  ? ?Pharmacy Consult for Heparin drip ?Indication: pulmonary embolus ? ?Allergies  ?Allergen Reactions  ? Amiodarone   ?  Caused toxicity   ? ? ?Patient Measurements: ?Height: 5\' 2"  (157.5 cm) ?Weight: (!) 155.8 kg (343 lb 7.6 oz) ?IBW/kg (Calculated) : 50.1 ?Heparin Dosing Weight: 93 kg ? ?Vital Signs: ?Temp: 98.5 ?F (36.9 ?C) (04/07 1517) ?Temp Source: Oral (04/07 1517) ?BP: 131/83 (04/07 1800) ?Pulse Rate: 62 (04/07 1800) ? ?Labs: ?Recent Labs  ?  07/15/21 ?0748 07/15/21 ?0935 07/15/21 ?1529 07/15/21 ?1731  ?HGB 12.6  --   --   --   ?HCT 41.3  --   --   --   ?PLT 174  --   --   --   ?APTT 29  --   --   --   ?LABPROT 14.2  --   --   --   ?INR 1.1  --   --   --   ?HEPARINUNFRC  --   --   --  0.27*  ?CREATININE 1.15*  --   --   --   ?TROPONINIHS 79* 88* 88*  --   ? ? ? ?Estimated Creatinine Clearance: 75.9 mL/min (A) (by C-G formula based on SCr of 1.15 mg/dL (H)). ? ? ?Medical History: ?Past Medical History:  ?Diagnosis Date  ? Abnormal mammogram, unspecified 2013  ? Atrial fibrillation ([place])   ? 2013  ? Breast screening, unspecified   ? CHF (congestive heart failure) ([place])   ? COPD (chronic obstructive pulmonary disease) ([place])   ? History of continuous positive airway pressure (CPAP) therapy at home   ? Hypothyroidism   ? Obesity, unspecified   ? Pneumonia   ? Screening for obesity   ? Sleep apnea   ? Special screening for malignant neoplasms, colon   ? Unspecified essential hypertension   ? ? ?Medications:  ?Medications Prior to Admission  ?Medication Sig Dispense Refill Last Dose  ? diltiazem (DILACOR XR) 240 MG 24 hr capsule Take 240 mg by mouth daily.   07/14/2021  ? fluticasone-salmeterol (ADVAIR) 250-50 MCG/ACT AEPB Inhale 1 puff into the lungs in the morning and at bedtime.   07/15/2021  ? furosemide (LASIX) 40 MG tablet Take 2 tablets (80 mg total) by mouth daily. 180 tablet 3 07/14/2021  ? potassium chloride SA (K-DUR,KLOR-CON) 20 MEQ tablet Take 1 tablet (20 mEq total) by mouth  daily. (Patient taking differently: Take 20 mEq by mouth at bedtime.) 30 tablet 6 07/14/2021  ? albuterol (VENTOLIN HFA) 108 (90 Base) MCG/ACT inhaler INHALE 2 PUFFS INTO THE LUNGS EVERY 6 (SIX) HOURS AS NEEDED FOR WHEEZING. 6.7 each 5 prn at unknown  ? amLODipine (NORVASC) 5 MG tablet amlodipine 5 mg tablet (Patient not taking: Reported on 01/17/2021)     ? cyanocobalamin 2000 MCG tablet Take 2,000 mcg by mouth daily.  (Patient not taking: Reported on 07/15/2021)   Not Taking  ? doxycycline (VIBRA-TABS) 100 MG tablet Take 1 tablet (100 mg total) by mouth 2 (two) times daily. For 10 days. Take with full glass of water, stay upright 30 min after taking. (Patient not taking: Reported on 07/15/2021) 20 tablet 0 Not Taking  ? Fluticasone-Umeclidin-Vilant (TRELEGY ELLIPTA) 100-62.5-25 MCG/INH AEPB Inhale 1 Inhaler into the lungs daily. (Patient not taking: Reported on 07/15/2021) 180 each 4 Not Taking  ? levothyroxine (SYNTHROID) 75 MCG tablet TAKE 1 TABLET BY MOUTH DAILY BEFORE BREAKFAST. WILL NEED THYROID LAB & FOLLOW-UP FOR FURTHER REFILLS (Patient not taking: Reported  on 07/15/2021) 90 tablet 0 Not Taking  ? metoprolol succinate (TOPROL-XL) 25 MG 24 hr tablet Take 3 tablets (75 mg total) by mouth daily. (Patient not taking: Reported on 07/15/2021) 90 tablet 1 Not Taking  ? OXYGEN Inhale 2 L/L into the lungs as needed.      ? predniSONE (DELTASONE) 10 MG tablet Take 6 tabs on day 1, 5 tabs on day 2, 4 tabs on day 3, 3 tabs on day 4, 2 tabs on day 5, 1 tab on day 6 (Patient not taking: Reported on 07/15/2021) 21 tablet 0 Not Taking  ? rivaroxaban (XARELTO) 20 MG TABS tablet Xarelto 20 mg tablet (Patient not taking: Reported on 07/15/2021)   Not Taking  ? Spacer/Aero-Holding Chambers DEVI 1 Device by Does not apply route as needed. 1 each 0   ? ?Scheduled:  ? [START ON 07/16/2021] Chlorhexidine Gluconate Cloth  6 each Topical Q0600  ? fentaNYL      ? furosemide  80 mg Oral Daily  ? ipratropium-albuterol  3 mL Nebulization Q4H  ?  midazolam      ? [START ON 07/16/2021] mometasone-formoterol  2 puff Inhalation BID  ? ?Infusions:  ? diltiazem (CARDIZEM) infusion 5 mg/hr (07/15/21 1225)  ? heparin 1,600 Units/hr (07/15/21 1800)  ? ? ?Assessment: ?61 yo F to start Heparin drip for PE+. ?Hx of Afib- pt was on Xarelto then changed to apixaban ,..but has not been taking for a couple of months due to cost (per ED MD note/discussion w/ Med Rec Tech) ?Hgb 12.6  plt 174  INR 1.1  aPTT 29 ? ?0407 @1731  HL= 0.27  (pt had thrombectomy/tPA, heparin drip stopped and restarted at 1430, so this level is only 3 hrs after restart.) ? ?Goal of Therapy:  ?S/p tPA: Goal HL=0.3-0.5 x 24 hrs ,then 0.3-0.7. ?Heparin level 0.3-0.7 units/ml ?Monitor platelets by anticoagulation protocol: Yes ?  ?Plan:  ?Patient s/p thrombectomy with tPA.  ?Goal HL=0.3-0.5 x 24 hrs ,then 0.3-0.7. ?No bolus. ?Will slightly increase Heparin drip to 1650 units /hr and f/u HL in 6 hrs. ?CBC daily ? ? ?Katherine Scott A ?07/15/2021,6:21 PM ? ? ?

## 2021-07-15 NOTE — ED Provider Notes (Signed)
? ?[facility] ?Provider Note ? ? ? Event Date/Time  ? First MD Initiated Contact with Patient 07/15/21 [phone]   ?  (approximate) ? ? ?History  ? ?Respiratory Distress ? ? ?HPI ? ?Katherine Scott is a 61 y.o. female  with a history of chronic respiratory failure on 1 L nasal cannula, HTN, thyroid disease, atrial fibrillation, COPD, OSA and CHF who presents via EMS from home for evaluation of 2 to 3 days of worsening cough and shortness of breath.  Patient endorses chest tightness when she is coughing.  She does not recall any fevers, headache, earache, sore throat, vomiting, diarrhea, burning with urination, abdominal pain, back pain denies any recent falls or injuries.  She denies any history of tobacco abuse, EtOH use illicit drug use.  She states she stopped taking her Eliquis a couple months ago due to affordability issues as well as her metoprolol has been taking diltiazem.  He states he typically takes 80 mg of Lasix daily but took 160 yesterday. ? ?Per EMS report patient was hypoxic at less than 60% on 4 L nasal cannula and was transported on CPAP. ? ?  ? ? ?Physical Exam  ?Triage Vital Signs: ?ED Triage Vitals  ?Enc Vitals Group  ?   BP   ?   Pulse   ?   Resp   ?   Temp   ?   Temp src   ?   SpO2   ?   Weight   ?   Height   ?   Head Circumference   ?   Peak Flow   ?   Pain Score   ?   Pain Loc   ?   Pain Edu?   ?   Excl. in [place]?   ? ? ?Most recent vital signs: ?Vitals:  ? 07/15/21 1045 07/15/21 1112  ?BP:    ?Pulse: (!) 118   ?Resp: 18   ?Temp:    ?SpO2: 93% 93%  ? ? ?General: Awake, appears uncomfortable. ?CV:  Good peripheral perfusion.  2+ radial pulses. ?Resp:  Normal effort.  Very diminished with some minimal end extra wheezing bilaterally and faint crackles at the bases. ?Abd:  No distention.  Soft. ?Other:  Mild lower extremity edema. ? ? ?ED Results / Procedures / Treatments  ?Labs ?(all labs ordered are listed, but only abnormal results are displayed) ?Labs Reviewed  ?COMPREHENSIVE  METABOLIC PANEL - Abnormal; Notable for the following components:  ?    Result Value  ? Glucose, Bld 165 (*)   ? Creatinine, Ser 1.15 (*)   ? Calcium 8.6 (*)   ? GFR, Estimated 55 (*)   ? All other components within normal limits  ?CBC WITH DIFFERENTIAL/PLATELET - Abnormal; Notable for the following components:  ? WBC 11.0 (*)   ? Neutro Abs 9.1 (*)   ? All other components within normal limits  ?BRAIN NATRIURETIC PEPTIDE - Abnormal; Notable for the following components:  ? B Natriuretic Peptide 217.4 (*)   ? All other components within normal limits  ?BLOOD GAS, VENOUS - Abnormal; Notable for the following components:  ? Bicarbonate 37.6 (*)   ? Acid-Base Excess 10.8 (*)   ? All other components within normal limits  ?D-DIMER, QUANTITATIVE - Abnormal; Notable for the following components:  ? D-Dimer, Quant 1.91 (*)   ? All other components within normal limits  ?TROPONIN I (HIGH SENSITIVITY) - Abnormal; Notable for the following components:  ? Troponin I (High  Sensitivity) 79 (*)   ? All other components within normal limits  ?TROPONIN I (HIGH SENSITIVITY) - Abnormal; Notable for the following components:  ? Troponin I (High Sensitivity) 88 (*)   ? All other components within normal limits  ?RESP PANEL BY RT-PCR (FLU A&B, COVID) [id number]  ?CULTURE, BLOOD (ROUTINE X 2)  ?CULTURE, BLOOD (ROUTINE X 2)  ?URINE CULTURE  ?EXPECTORATED SPUTUM ASSESSMENT W GRAM STAIN, RFLX TO RESP C  ?LACTIC ACID, PLASMA  ?PROTIME-INR  ?APTT  ?PROCALCITONIN  ?MAGNESIUM  ?URINALYSIS, COMPLETE (UACMP) WITH MICROSCOPIC  ?HEPARIN LEVEL (UNFRACTIONATED)  ?HIV ANTIBODY (ROUTINE TESTING W REFLEX)  ?LEGIONELLA PNEUMOPHILA SEROGP 1 UR AG  ?STREP PNEUMONIAE URINARY ANTIGEN  ?HEMOGLOBIN A1C  ?TSH  ? ? ? ?EKG ? ?EKG is remarkable for A-fib with a rate of 119, normal axis, otherwise unremarkable intervals without clear evidence of acute ischemia or significant arrhythmia. ? ? ?RADIOLOGY ? ?Chest x-ray reviewed by myself shows some cardiomegaly and  increased patchiness around the right hilum concerning for pulmonary edema versus infiltrate.  No pneumothorax, large effusion or other clear acute process.  I also reviewed radiology interpretation. ? ?CTA chest, interpretation shows bilateral PEs and some elevation of the bilateral lower lobes.  I do not see overt edema or pneumothorax or large effusion.  I discussed this with the reading radiologist and agree with findings of bilateral PEs with evidence of right heart strain as well as bilateral opacities concerning for multifocal pneumonia notation of cholelithiasis and aortic atherosclerosis. ? ? ?PROCEDURES: ? ?Critical Care performed: Yes, see critical care procedure note(s) ? ?.Critical Care ?Performed by: Lucrezia Starch, MD ?Authorized by: Lucrezia Starch, MD  ? ?Critical care provider statement:  ?  Critical care time (minutes):  30 ?  Critical care was necessary to treat or prevent imminent or life-threatening deterioration of the following conditions:  Respiratory failure ?  Critical care was time spent personally by me on the following activities:  Development of treatment plan with patient or surrogate, discussions with consultants, evaluation of patient's response to treatment, examination of patient, ordering and review of laboratory studies, ordering and review of radiographic studies, ordering and performing treatments and interventions, pulse oximetry, re-evaluation of patient's condition and review of old charts ? ? ? ?MEDICATIONS ORDERED IN ED: ?Medications  ?aspirin chewable tablet 324 mg (has no administration in time range)  ?diltiazem (CARDIZEM) 1 mg/mL load via infusion 10 mg (10 mg Intravenous Bolus from Bag 07/15/21 0941)  ?  And  ?diltiazem (CARDIZEM) 125 mg in dextrose 5% 125 mL (1 mg/mL) infusion (5 mg/hr Intravenous New Bag/Given 07/15/21 0941)  ?cefTRIAXone (ROCEPHIN) 1 g in sodium chloride 0.9 % 100 mL IVPB (1 g Intravenous New Bag/Given 07/15/21 1055)  ?azithromycin (ZITHROMAX) 500 mg  in sodium chloride 0.9 % 250 mL IVPB (has no administration in time range)  ?heparin bolus via infusion 5,000 Units (has no administration in time range)  ?heparin ADULT infusion 100 units/mL (25000 units/270mL) (has no administration in time range)  ?ipratropium-albuterol (DUONEB) 0.5-2.5 (3) MG/3ML nebulizer solution 3 mL (has no administration in time range)  ?albuterol (PROVENTIL) (2.5 MG/3ML) 0.083% nebulizer solution 2.5 mg (has no administration in time range)  ?dextromethorphan-guaiFENesin (MUCINEX DM) 30-600 MG per 12 hr tablet 1 tablet (has no administration in time range)  ?ondansetron (ZOFRAN) injection 4 mg (has no administration in time range)  ?acetaminophen (TYLENOL) tablet 650 mg (has no administration in time range)  ?hydrALAZINE (APRESOLINE) injection 5 mg (has no administration in time range)  ?  ipratropium-albuterol (DUONEB) 0.5-2.5 (3) MG/3ML nebulizer solution 9 mL (9 mLs Nebulization Given 07/15/21 0808)  ?methylPREDNISolone sodium succinate (SOLU-MEDROL) 125 mg/2 mL injection 125 mg (125 mg Intravenous Given 07/15/21 1036)  ?iohexol (OMNIPAQUE) 350 MG/ML injection 75 mL (75 mLs Intravenous Contrast Given 07/15/21 1011)  ? ? ? ?IMPRESSION / MDM / ASSESSMENT AND PLAN / ED COURSE  ?I reviewed the triage vital signs and the nursing notes. ?             ?               ? ?Differential diagnosis includes, but is not limited to COPD exacerbation, CHF, ACS, PE, pneumonia, symptomatic pleural effusion, anemia, and metabolic derangements. ? ?EKG is remarkable for A-fib with a rate of 119, normal axis, otherwise unremarkable intervals without clear evidence of acute ischemia or significant arrhythmia.  Initial troponin elevated 79 and repeat is 88 fairly stable.  I have a lower suspicion for occlusion MI at this time.  I suspect some demand ischemia in the setting of respiratory failure and bilateral PEs. ? ?Chest x-ray reviewed by myself shows some cardiomegaly and increased patchiness around the right hilum  concerning for pulmonary edema versus infiltrate.  No pneumothorax, large effusion or other clear acute process.  I also reviewed radiology interpretation. ? ?CTA chest ordered due to elevated D-dimer, inte

## 2021-07-15 NOTE — Progress Notes (Signed)
Elink following code sepsis °

## 2021-07-15 NOTE — Progress Notes (Signed)
Admission profile updated. ?

## 2021-07-15 NOTE — Progress Notes (Signed)
CODE SEPSIS - PHARMACY COMMUNICATION ? ?**Broad Spectrum Antibiotics should be administered within 1 hour of Sepsis diagnosis** ? ?Time Code Sepsis Called/Page Received: 1053 ? ?Antibiotics Ordered: CTX ? ?Time of 1st antibiotic administration: 1055 ? ?Additional action taken by pharmacy:   ? ?If necessary, Name of Provider/Nurse Contacted:   ? ? ? ?Angelique Blonder ,PharmD ?Clinical Pharmacist  ?07/15/2021  1:53 PM  ?

## 2021-07-15 NOTE — ED Notes (Signed)
Additional lavender tube sent for A1c.  ?

## 2021-07-15 NOTE — ED Triage Notes (Signed)
Patient to ED via [facility] from home for respiratory distress. Per EMS pt having more shortness of breath over the last 2 days. Pt has hx/o COPD and CHF. Pt chronically on 1 liter of oxygen at home, when EMS arrived pt was on 4 liters of O2 and satting in the 60's. Pt was placed on CPAP by EMS with sats coming up to around 88%. Pt was hypertensive with EMS. Pt in no distress at this time on BiPap.  ?

## 2021-07-15 NOTE — H&P (Addendum)
?History and Physical  ? ? Katherine Scott UKG:[id number] DOB: 10-Mar-1961 DOA: 07/15/2021 ? ?Referring MD/NP/PA:  ? ?PCP: Smitty Cords, DO  ? ?Patient coming from:  The patient is coming from home.  At baseline, pt is independent for most of ADL.       ? ?Chief Complaint: SOB ? ?HPI: Katherine Scott is a 61 y.o. female with medical history significant of hypertension, COPD on 1 L oxygen, OSA on CPAP, hypothyroidism, morbid obesity with a BMI 66.49, sCHF with EF 45-50%, atrial fibrillation (stopped taking Eliquis more than 2 months ago), who presents with shortness of breath. ? ?Patient states that she has shortness of breath for more than 2 days, which has been progressively worsening.  Patient has cough with little clear and yellow-colored mucus production.  Denies chest pain, fever or chills.  No nausea, vomiting, diarrhea or abdominal pain.  No symptoms of UTI.  Patient states that she stopped taking Eliquis more than 2 months ago since she could not afford this medication. ? ?Patient was found to have oxygen desaturation to 60s% on home level 1 L oxygen, with severe respiratory distress, cannot speak in full sentence, BiPAP started in ED. ? ?Data Reviewed and ED Course: pt was found to have positive D-dimer 1.91, negative COVID PCR, WBC 11.0, lactic acid 1.1, INR 1.1, PTT 29, troponin level 88, BNP 217, mild AKI with creatinine 1.15, BUN 18 and GFR 55 (baseline creatinine 0.88 on 02/20/2020), temperature normal, blood pressure 143/82, heart rate of 120, RR 20.  Chest x-ray showed interstitial opacity.  CT angiogram showed bilateral PE with CT evidence of right heart strain and patchy infiltration bilaterally.  VBG with pH 7.42, CO2 58, O2 44.9 patient is admitted to stepdown as inpatient.  Dr. Wyn Quaker of vascular surgery is consulted. ? ?CT angiogram: ?Bilateral pulmonary emboli are noted. Positive for acute PE with CT ?evidence of right heart strain (RV/LV Ratio = 1.0) consistent with ?at least submassive  (intermediate risk) PE. The presence of right ?heart strain has been associated with an increased risk of morbidity ?and mortality. Please refer to the "PE Focused" order set in EPIC. ?Critical Value/emergent results were called by telephone at the time ?of interpretation on 07/15/2021 at 10:43 am to provider [facility] ?, who verbally acknowledged these results. ?  ?Patchy bilateral lung opacities are noted most consistent with ?multifocal pneumonia. ?  ?Cholelithiasis. ?  ?Aortic Atherosclerosis (ICD10-I70.0). ? ? ?EKG: I have personally reviewed.  Atrial fibrillation, QTc 445, low voltage, nonspecific T wave change ? ?Review of Systems:  ? ?General: no fevers, chills, no body weight gain, has fatigue ?HEENT: no blurry vision, hearing changes or sore throat ?Respiratory: has dyspnea, coughing, no wheezing ?CV: no chest pain, no palpitations ?GI: no nausea, vomiting, abdominal pain, diarrhea, constipation ?GU: no dysuria, burning on urination, increased urinary frequency, hematuria  ?Ext: has leg edema ?Neuro: no unilateral weakness, numbness, or tingling, no vision change or hearing loss ?Skin: no rash, no skin tear. ?MSK: No muscle spasm, no deformity, no limitation of range of movement in spin ?Heme: No easy bruising.  ?Travel history: No recent long distant travel. ? ? ?Allergy:  ?Allergies  ?Allergen Reactions  ? Amiodarone   ?  Caused toxicity   ? ? ?Past Medical History:  ?Diagnosis Date  ? Abnormal mammogram, unspecified 2013  ? Atrial fibrillation (HCC)   ? 2013  ? Breast screening, unspecified   ? CHF (congestive heart failure) (HCC)   ? COPD (chronic  obstructive pulmonary disease) (HCC)   ? History of continuous positive airway pressure (CPAP) therapy at home   ? Hypothyroidism   ? Obesity, unspecified   ? Pneumonia   ? Screening for obesity   ? Sleep apnea   ? Special screening for malignant neoplasms, colon   ? Unspecified essential hypertension   ? ? ?Past Surgical History:  ?Procedure Laterality  Date  ? ABDOMINAL HYSTERECTOMY  2011  ? ANTERIOR CRUCIATE LIGAMENT REPAIR  2003  ? BREAST BIOPSY Left January 22, 2012  ? left breast stereotactic biopsy showed fibroadenomatous changes with microcalcifications, fat necrosis, and sclerosing adenosis and columnar cell changes. No evidence of atypia or malignancy.  ? CARDIOVERSION N/A 09/22/2016  ? Procedure: CARDIOVERSION;  Surgeon: Dalia Heading, MD;  Location: [facility] ORS;  Service: Cardiovascular;  Laterality: N/A;  ? CARDIOVERSION N/A 01/17/2018  ? Procedure: CARDIOVERSION;  Surgeon: Dalia Heading, MD;  Location: [facility];  Service: Cardiovascular;  Laterality: N/A;  ? CARDIOVERSION N/A 01/09/2019  ? Procedure: CARDIOVERSION;  Surgeon: Dalia Heading, MD;  Location: [facility] ORS;  Service: Cardiovascular;  Laterality: N/A;  ? CESAREAN SECTION  1985, 1987, and 1998  ? COLONOSCOPY  2011  ? Dr. Mechele Collin  ? ELECTROPHYSIOLOGIC STUDY N/A 12/03/2014  ? Procedure: Cardioversion;  Surgeon: Dalia Heading, MD;  Location: [facility] OR[facility];  Service: Cardiovascular;  Laterality: N/A;  ? ELECTROPHYSIOLOGIC STUDY N/A 04/01/2015  ? Procedure: Cardioversion;  Surgeon: Dalia Heading, MD;  Location: [facility] OR[facility];  Service: Cardiovascular;  Laterality: N/A;  ? KNEE SURGERY  2013  ? ? ?Social History:  reports that she has never smoked. She has been exposed to tobacco smoke. She has never used smokeless tobacco. She reports that she does not drink alcohol and does not use drugs. ? ?Family History:  ?Family History  ?Problem Relation Age of Onset  ? Colon cancer Mother   ?     diagnosis age 9's  ? Colon cancer Sister   ?     diagnosis age 22  ? Atrial fibrillation Sister   ? Breast cancer Other   ?     Maternal Grandmother; diagnosis age 15's  ? Lung cancer Other   ?     Paternal Grandmother; diagnosis age 35's  ? Colon cancer Other   ?     Paternal Grandfather; diagnosis age 45's  ? Heart disease Father   ?  ? ?Prior to Admission medications   ?Medication Sig Start Date End Date Taking? Authorizing  Provider  ?diltiazem (DILACOR XR) 240 MG 24 hr capsule Take 240 mg by mouth daily.   Yes [provider]  ?fluticasone-salmeterol (ADVAIR) 250-50 MCG/ACT AEPB Inhale 1 puff into the lungs in the morning and at bedtime.   Yes [provider]  ?furosemide (LASIX) 40 MG tablet Take 2 tablets (80 mg total) by mouth daily. 05/28/20  Yes Delma Freeze, FNP  ?potassium chloride SA (K-DUR,KLOR-CON) 20 MEQ tablet Take 1 tablet (20 mEq total) by mouth daily. ?Patient taking differently: Take 20 mEq by mouth at bedtime. 02/19/15  Yes Dalia Heading, MD  ?albuterol (VENTOLIN HFA) 108 (90 Base) MCG/ACT inhaler INHALE 2 PUFFS INTO THE LUNGS EVERY 6 (SIX) HOURS AS NEEDED FOR WHEEZING. 03/17/20   Hunsucker, Lesia Sago, MD  ?amLODipine (NORVASC) 5 MG tablet amlodipine 5 mg tablet ?Patient not taking: Reported on 01/17/2021    [provider]  ?cyanocobalamin 2000 MCG tablet Take 2,000 mcg by mouth daily.  ?Patient not taking: Reported on  07/15/2021    [provider]  ?doxycycline (VIBRA-TABS) 100 MG tablet Take 1 tablet (100 mg total) by mouth 2 (two) times daily. For 10 days. Take with full glass of water, stay upright 30 min after taking. ?Patient not taking: Reported on 07/15/2021 01/21/21   Smitty CordsKaramalegos, Alexander J, DO  ?Fluticasone-Umeclidin-Vilant (TRELEGY ELLIPTA) 100-62.5-25 MCG/INH AEPB Inhale 1 Inhaler into the lungs daily. ?Patient not taking: Reported on 07/15/2021 01/22/20   Hunsucker, Lesia SagoMatthew R, MD  ?levothyroxine (SYNTHROID) 75 MCG tablet TAKE 1 TABLET BY MOUTH DAILY BEFORE BREAKFAST. WILL NEED THYROID LAB & FOLLOW-UP FOR FURTHER REFILLS ?Patient not taking: Reported on 07/15/2021 02/03/20   Smitty CordsKaramalegos, Alexander J, DO  ?metoprolol succinate (TOPROL-XL) 25 MG 24 hr tablet Take 3 tablets (75 mg total) by mouth daily. ?Patient not taking: Reported on 07/15/2021 02/20/20   Almon HerculesGonfa, Taye T, MD  ?OXYGEN Inhale 2 L/L into the lungs as needed.     [provider]  ?predniSONE (DELTASONE) 10  MG tablet Take 6 tabs on day 1, 5 tabs on day 2, 4 tabs on day 3, 3 tabs on day 4, 2 tabs on day 5, 1 tab on day 6 ?Patient not taking: Reported on 07/15/2021 01/17/21   Lorre MunroeBaity, Regina W, NP  ?rivaroxaban Arlina Robes(XAR

## 2021-07-15 NOTE — Op Note (Signed)
[facility] ? Percutaneous Study/Intervention Procedural Note ? ? ?Date of Surgery: 07/15/2021,2:13 PM ? ?Surgeon: Festus Barren ? ?Pre-operative Diagnosis: Symptomatic bilateral pulmonary emboli, chronic lung disease ? ?Post-operative diagnosis:  Same ? ?Procedure(s) Performed: ? 1.  Contrast injection right heart ? 2.  Thrombolysis with a total of 8 mg of tPA, 4 mg in each of the main pulmonary arteries ? 3.  Mechanical thrombectomy to the left upper lobe pulmonary artery and the right lower and right middle lobe pulmonary arteries with the penumbra CAT 12 device ? 4.  Selective catheter placement right lower lobe, middle lobe, and upper lobe pulmonary ? 5.  Selective catheter placement left lower lobe and upper lobe pulmonary artery ?  ? ?Anesthesia: Conscious sedation was administered under my direct supervision by the interventional radiology RN. IV Versed plus fentanyl were utilized. Continuous ECG, pulse oximetry and blood pressure was monitored throughout the entire procedure.  Versed and fentanyl were administered intravenously.  Conscious sedation was administered for a total of 28 minutes using 1 of Versed and 50 mcg of Fentanyl. ? ?EBL: 350 cc ? ?Sheath: 11 French right femoral vein ? ?Contrast: 80 cc  ? ?Fluoroscopy Time: 8.9 minutes ? ?Indications:  Patient presents with pulmonary emboli. The patient is symptomatic with hypoxemia and dyspnea on exertion.  There is evidence of right heart strain on the CT angiogram. The patient is otherwise a good candidate for intervention and even the long-term benefits pulmonary angiography with thrombolysis is offered. The risks and benefits are reviewed long-term benefits are discussed. All questions are answered patient agrees to proceed. ? ?Procedure:  Katherine Scott a 61 y.o. female who was identified and appropriate procedural time out was performed.  The patient was then placed supine on the table and prepped and draped in the usual sterile  fashion.  Ultrasound was used to evaluate the right common femoral vein.  It was patent, as it was echolucent and compressible.  A digital ultrasound image was acquired for the permanent record.  A Seldinger needle was used to access the right common femoral vein under direct ultrasound guidance.  A 0.035 J wire was advanced without resistance and a 5Fr sheath was placed and then upsized to an 8 [place] sheath.   ? ?The wire and pigtail catheter were then negotiated into the right atrium and bolus injection of contrast was utilized to demonstrate the right ventricle and the pulmonary artery outflow. The wire and catheter were then negotiated into the main pulmonary artery where hand injection of contrast was utilized to demonstrate the pulmonary arteries and confirm the locations of the pulmonary emboli.  Selective catheter placement into each of the lobar pulmonary arteries was performed to try to evaluate the pulmonary arteries.  Image quality was extremely poor due to her very large size and rapid respiratory rate.  The left lower lobe and left upper lobe were cannulated first.  Image quality was poor but there appeared to be some thrombus in the left upper lobe and may be a small amount of thrombus in the left lower lobe.  I then used the JR4 catheter and the advantage wire to transition to the right side and selectively cannulate the right lower lobe, right middle lobe, and then right upper lobe pulmonary artery for imaging. Image quality was very poor but there appeared to be clot in the right lower lobe and right middle lobes but no obvious clot in upper lobe pulmonary artery. ? ? ?TPA was reconstituted  and delivered onto the table. A total of 8 milligrams of TPA was utilized.  4 mg was administered on the left side and 4 mg was administered on the right side. This was then allowed to dwell. ? ?The Penumbra Cat 12 catheter was then advanced up into the pulmonary vasculature. The right lung was addressed first.  Catheter was negotiated into the right lower lobe pulmonary artery and mechanical thrombectomy was performed with a separate. Follow-up imaging demonstrated a good result and therefore the catheter was renegotiated into the right middle lobe pulmonary artery and again mechanical thrombectomy was performed. Passes were made with both the Penumbra catheter itself as well as introducing the separator. Follow-up imaging was then performed and the thrombosis appeared improved. ? ?The Penumbra Cat 12 catheter was then negotiated to the opposite side. The left lung was then addressed. Catheter was negotiated into the left main and the initial part of the left upper lobe pulmonary artery although cannulating this was difficult due to the steep angle and mechanical thrombectomy was performed. Passes were made with both the Penumbra catheter itself as well as introducing the separator. Follow-up imaging was then performed. This appeared improved ? ?After review these images wires were reintroduced and the catheters removed. Then, the sheath is then pulled and pressures held. A safeguard is placed. ? ? ? ?Findings:  ? Right heart imaging:  Right atrium and right ventricle and the pulmonary outflow tract appears markedly dilated ? Right lung: Image quality was very poor but there appeared to be clot in the right lower lobe and right middle lobes but no obvious clot in upper lobe pulmonary artery. ? Left lung:  Image quality was poor but there appeared to be some thrombus in the left upper lobe and may be a small amount of thrombus in the left lower lobe.  ? ? ? ?Disposition: Patient was taken to the recovery room in stable condition having tolerated the procedure well. ? ?Festus Barren ?07/15/2021,2:13 PM  ?

## 2021-07-16 ENCOUNTER — Encounter: Payer: Self-pay | Admitting: Vascular Surgery

## 2021-07-16 ENCOUNTER — Inpatient Hospital Stay
Admit: 2021-07-16 | Discharge: 2021-07-16 | Disposition: A | Discharge status: Home / Self Care | Payer: Medicaid Other | Attending: Vascular Surgery | Admitting: Vascular Surgery

## 2021-07-16 DIAGNOSIS — I2609 Other pulmonary embolism with acute cor pulmonale: Principal | ICD-10-CM

## 2021-07-16 DIAGNOSIS — G4733 Obstructive sleep apnea (adult) (pediatric): Secondary | ICD-10-CM

## 2021-07-16 LAB — BASIC METABOLIC PANEL
Anion gap: 8 (ref 5–15)
BUN: 25 mg/dL — ABNORMAL HIGH (ref 6–20)
CO2: 31 mmol/L (ref 22–32)
Calcium: 8.4 mg/dL — ABNORMAL LOW (ref 8.9–10.3)
Chloride: 100 mmol/L (ref 98–111)
Creatinine, Ser: 1.13 mg/dL — ABNORMAL HIGH (ref 0.44–1.00)
GFR, Estimated: 56 mL/min — ABNORMAL LOW (ref >60)
Glucose, Bld: 183 mg/dL — ABNORMAL HIGH (ref 70–99)
Potassium: 4.5 mmol/L (ref 3.5–5.1)
Sodium: 139 mmol/L (ref 135–145)

## 2021-07-16 LAB — ECHOCARDIOGRAM COMPLETE
AR max vel: 1.71 cm2
AV Peak grad: 7.5 mmHg
Ao pk vel: 1.37 m/s
Area-P 1/2: 5.31 cm2
Height: 62 in
S' Lateral: 2.6 cm
Weight: 5495.63 oz

## 2021-07-16 LAB — HEMOGLOBIN A1C
Hgb A1c MFr Bld: 5.7 % — ABNORMAL HIGH (ref 4.8–5.6)
Mean Plasma Glucose: 117 mg/dL

## 2021-07-16 LAB — CBC
HCT: 37.8 % (ref 36.0–46.0)
Hemoglobin: 11.5 g/dL — ABNORMAL LOW (ref 12.0–15.0)
MCH: 29.4 pg (ref 26.0–34.0)
MCHC: 30.4 g/dL (ref 30.0–36.0)
MCV: 96.7 fL (ref 80.0–100.0)
Platelets: 152 10*3/uL (ref 150–400)
RBC: 3.91 MIL/uL (ref 3.87–5.11)
RDW: 13.8 % (ref 11.5–15.5)
WBC: 10 10*3/uL (ref 4.0–10.5)
nRBC: 0 % (ref 0.0–0.2)

## 2021-07-16 LAB — LIPID PANEL
Cholesterol: 177 mg/dL (ref 0–200)
HDL: 56 mg/dL (ref >40)
LDL Cholesterol: 100 mg/dL — ABNORMAL HIGH (ref 0–99)
Total CHOL/HDL Ratio: 3.2 RATIO
Triglycerides: 106 mg/dL (ref <150)
VLDL: 21 mg/dL (ref 0–40)

## 2021-07-16 LAB — URINALYSIS, COMPLETE (UACMP) WITH MICROSCOPIC
Bilirubin Urine: NEGATIVE
Glucose, UA: NEGATIVE mg/dL
Hgb urine dipstick: NEGATIVE
Ketones, ur: NEGATIVE mg/dL
Leukocytes,Ua: NEGATIVE
Nitrite: NEGATIVE
Protein, ur: 30 mg/dL — AB
Specific Gravity, Urine: 1.039 — ABNORMAL HIGH (ref 1.005–1.030)
pH: 6 (ref 5.0–8.0)

## 2021-07-16 LAB — HEPARIN LEVEL (UNFRACTIONATED)
Heparin Unfractionated: 0.4 IU/mL (ref 0.30–0.70)
Heparin Unfractionated: 0.4 IU/mL (ref 0.30–0.70)

## 2021-07-16 MED ORDER — FUROSEMIDE 10 MG/ML IJ SOLN
40.0000 mg | Freq: Once | INTRAMUSCULAR | Status: AC
  Administered 2021-07-16: 40 mg via INTRAVENOUS
  Filled 2021-07-16: qty 4

## 2021-07-16 MED ORDER — PERFLUTREN LIPID MICROSPHERE
1.0000 mL | INTRAVENOUS | Status: AC | PRN
  Administered 2021-07-16: 4 mL via INTRAVENOUS
  Filled 2021-07-16: qty 10

## 2021-07-16 NOTE — Progress Notes (Signed)
ANTICOAGULATION CONSULT NOTE  ? ?Pharmacy Consult for Heparin drip ?Indication: pulmonary embolus ? ?Allergies  ?Allergen Reactions  ? Amiodarone   ?  Caused toxicity   ? ? ?Patient Measurements: ?Height: 5\' 2"  (157.5 cm) ?Weight: (!) 155.8 kg (343 lb 7.6 oz) ?IBW/kg (Calculated) : 50.1 ?Heparin Dosing Weight: 93 kg ? ?Vital Signs: ?Temp: 98.2 ?F (36.8 ?C) (04/08 0100) ?Temp Source: Axillary (04/08 0100) ?BP: 113/66 (04/08 0100) ?Pulse Rate: 98 (04/08 0115) ? ?Labs: ?Recent Labs  ?  07/15/21 ?0748 07/15/21 ?0935 07/15/21 ?1529 07/15/21 ?1731 07/15/21 ?1830 07/16/21 ?0031  ?HGB 12.6  --   --   --   --   --   ?HCT 41.3  --   --   --   --   --   ?PLT 174  --   --   --   --   --   ?APTT 29  --   --   --  49*  --   ?LABPROT 14.2  --   --   --   --   --   ?INR 1.1  --   --   --   --   --   ?HEPARINUNFRC  --   --   --  0.27*  --  0.40  ?CREATININE 1.15*  --   --   --   --   --   ?TROPONINIHS 79* 88* 88*  --   --   --   ? ? ? ?Estimated Creatinine Clearance: 75.9 mL/min (A) (by C-G formula based on SCr of 1.15 mg/dL (H)). ? ? ?Medical History: ?Past Medical History:  ?Diagnosis Date  ? Abnormal mammogram, unspecified 2013  ? Atrial fibrillation (HCC)   ? 2013  ? Breast screening, unspecified   ? CHF (congestive heart failure) (HCC)   ? COPD (chronic obstructive pulmonary disease) (HCC)   ? History of continuous positive airway pressure (CPAP) therapy at home   ? Hypothyroidism   ? Obesity, unspecified   ? Pneumonia   ? Screening for obesity   ? Sleep apnea   ? Special screening for malignant neoplasms, colon   ? Unspecified essential hypertension   ? ? ?Medications:  ?Medications Prior to Admission  ?Medication Sig Dispense Refill Last Dose  ? diltiazem (DILACOR XR) 240 MG 24 hr capsule Take 240 mg by mouth daily.   07/14/2021  ? fluticasone-salmeterol (ADVAIR) 250-50 MCG/ACT AEPB Inhale 1 puff into the lungs in the morning and at bedtime.   07/15/2021  ? furosemide (LASIX) 40 MG tablet Take 2 tablets (80 mg total) by mouth  daily. 180 tablet 3 07/14/2021  ? potassium chloride SA (K-DUR,KLOR-CON) 20 MEQ tablet Take 1 tablet (20 mEq total) by mouth daily. (Patient taking differently: Take 20 mEq by mouth at bedtime.) 30 tablet 6 07/14/2021  ? albuterol (VENTOLIN HFA) 108 (90 Base) MCG/ACT inhaler INHALE 2 PUFFS INTO THE LUNGS EVERY 6 (SIX) HOURS AS NEEDED FOR WHEEZING. 6.7 each 5 prn at unknown  ? amLODipine (NORVASC) 5 MG tablet amlodipine 5 mg tablet (Patient not taking: Reported on 01/17/2021)     ? cyanocobalamin 2000 MCG tablet Take 2,000 mcg by mouth daily.  (Patient not taking: Reported on 07/15/2021)   Not Taking  ? doxycycline (VIBRA-TABS) 100 MG tablet Take 1 tablet (100 mg total) by mouth 2 (two) times daily. For 10 days. Take with full glass of water, stay upright 30 min after taking. (Patient not taking: Reported on 07/15/2021) 20 tablet 0 Not Taking  ?  Fluticasone-Umeclidin-Vilant (TRELEGY ELLIPTA) 100-62.5-25 MCG/INH AEPB Inhale 1 Inhaler into the lungs daily. (Patient not taking: Reported on 07/15/2021) 180 each 4 Not Taking  ? levothyroxine (SYNTHROID) 75 MCG tablet TAKE 1 TABLET BY MOUTH DAILY BEFORE BREAKFAST. WILL NEED THYROID LAB & FOLLOW-UP FOR FURTHER REFILLS (Patient not taking: Reported on 07/15/2021) 90 tablet 0 Not Taking  ? metoprolol succinate (TOPROL-XL) 25 MG 24 hr tablet Take 3 tablets (75 mg total) by mouth daily. (Patient not taking: Reported on 07/15/2021) 90 tablet 1 Not Taking  ? OXYGEN Inhale 2 L/L into the lungs as needed.      ? predniSONE (DELTASONE) 10 MG tablet Take 6 tabs on day 1, 5 tabs on day 2, 4 tabs on day 3, 3 tabs on day 4, 2 tabs on day 5, 1 tab on day 6 (Patient not taking: Reported on 07/15/2021) 21 tablet 0 Not Taking  ? rivaroxaban (XARELTO) 20 MG TABS tablet Xarelto 20 mg tablet (Patient not taking: Reported on 07/15/2021)   Not Taking  ? Spacer/Aero-Holding Chambers DEVI 1 Device by Does not apply route as needed. 1 each 0   ? ?Scheduled:  ? Chlorhexidine Gluconate Cloth  6 each Topical Q0600  ?  diltiazem  240 mg Oral Daily  ? furosemide  80 mg Oral Daily  ? ipratropium-albuterol  3 mL Nebulization Q4H  ? mometasone-formoterol  2 puff Inhalation BID  ? ?Infusions:  ? diltiazem (CARDIZEM) infusion Stopped (07/15/21 1837)  ? heparin 1,650 Units/hr (07/16/21 0112)  ? ? ?Assessment: ?61 yo F to start Heparin drip for PE+. ?Hx of Afib- pt was on Xarelto then changed to apixaban ,..but has not been taking for a couple of months due to cost (per ED MD note/discussion w/ Med Rec Tech) ?Hgb 12.6  plt 174  INR 1.1  aPTT 29 ? ?0407 @1731  HL= 0.27  (pt had thrombectomy/tPA, heparin drip stopped and restarted at 1430, so this level is only 3 hrs after restart.) ?4/08 0031 HL 0.40, therapeutic x 1 ? ?Goal of Therapy:  ?S/p tPA: Goal HL=0.3-0.5 x 24 hrs ,then 0.3-0.7. ?Heparin level 0.3-0.7 units/ml ?Monitor platelets by anticoagulation protocol: Yes ?  ?Plan:  ?Patient s/p thrombectomy with tPA.  ?Goal HL=0.3-0.5 x 24 hrs ,then 0.3-0.7. ?Continue Heparin drip at 1650 units /hr ?Recheck HL in 6 hrs to confirm ?CBC daily ? ? ?6/08, PharmD, MBA ?07/16/2021 ?1:35 AM ? ? ? ?

## 2021-07-16 NOTE — Assessment & Plan Note (Addendum)
Continue bronchodilators. 

## 2021-07-16 NOTE — Assessment & Plan Note (Addendum)
She has volume overload due to acute cor pulmonale.   ?Patient still has high flow oxygen, continue IV Lasix. ?

## 2021-07-16 NOTE — Assessment & Plan Note (Addendum)
Secondary to acute pulmonary emboli, chronic systolic congestive heart failure and significant sleep apnea. ?Currently on 40% heated high flow.  Continue to wean oxygen. ?

## 2021-07-16 NOTE — Assessment & Plan Note (Addendum)
Reviewed prior lab, also looking to [facility] system record, most recent outpatient lab was performed in 02/2020, at that time renal function was normal.  GFR has improved.  AKI resolved ?

## 2021-07-16 NOTE — Assessment & Plan Note (Addendum)
Continue Synthroid °

## 2021-07-16 NOTE — Assessment & Plan Note (Addendum)
Patient has been diagnosed with obstructive sleep apnea.  She was on CPAP for several years until few months ago, she took off CPAP for a few months before admission. ?Patient currently will be on high flow oxygen while BiPAP at nighttime.   ?Discussed with RT and the case management, they will try to set up home CPAP at time of discharge.  May be difficult as she does not have insurance ?

## 2021-07-16 NOTE — Hospital Course (Addendum)
Katherine Scott is a 61 y.o. female with medical history significant of hypertension, COPD on 1 L oxygen, OSA on CPAP, hypothyroidism, morbid obesity with a BMI 66.49, sCHF with EF 45-50%, atrial fibrillation (stopped taking Eliquis more than 2 months ago), who presents with shortness of breath. ?CT angiogram of the chest showed a submassive PE with evidence of acute right heart strain.  Patient was given IV heparin.  Pulmonary thrombectomy was performed on 4/7. ? ?4/12: Transition to Eliquis.  Started gabapentin for lower extremity neuropathy ?4/13: PT, OT recommends HHPT, OT.  TOC setting of BiPAP/CPAP for home at discharge ?

## 2021-07-16 NOTE — Assessment & Plan Note (Addendum)
Morbid obesity ?Complicates prognosis.  She does not have insurance which limits her care ?

## 2021-07-16 NOTE — Progress Notes (Signed)
?Progress Note ? ? ?Patient: Katherine Scott [id number] DOB: July 22, 1960 DOA: 07/15/2021     1 ?DOS: the patient was seen and examined on 07/16/2021 ?  ?Brief hospital course: ? Katherine Scott is a 61 y.o. female with medical history significant of hypertension, COPD on 1 L oxygen, OSA on CPAP, hypothyroidism, morbid obesity with a BMI 66.49, sCHF with EF 45-50%, atrial fibrillation (stopped taking Eliquis more than 2 months ago), who presents with shortness of breath. ?CT angiogram of the chest showed a submassive PE with evidence of acute right heart strain.  Patient was given IV heparin.  Pulmonary thrombectomy was performed on 4/7. ? ?Assessment and Plan: ?* Pulmonary embolism with acute cor pulmonale ([place]) ?Patient CT scan had a submassive bilateral PE, associated with cor pulmonale.  Patient is status post pulmonary thrombectomy.  Due to large load of PE, patient still requiring high flow oxygen.  She also have evidence of right-sided congestive heart failure, will give 40 mg IV Lasix for today, will reassess daily if additional diuretics needed. ?Continue heparin drip until patient is more stable.  Then changed to Eliquis.  Will discuss with pharmacy and case management about the cost of Eliquis before discharge. ? ?OSA (obstructive sleep apnea) ?Patient has been diagnosed with obstructive sleep apnea.  She was on CPAP for several years until few months ago, she took off CPAP at that time. ?Patient will be on BiPAP at nighttime, will obtain overnight oximetry when patient condition is more stable to qualify her again for a CPAP. ? ?HTN (hypertension) ?Continue some home medicines. ? ?AKI (acute kidney injury) ([place]) ?Reviewed prior lab, also looking to [facility] system record, most recent outpatient lab was performed in 02/2020, at that time renal function was normal.  Patient either has acute kidney injury versus a chronic kidney disease stage IIIa.  We will decide at time of discharge based on whether renal function  will recover or not. ? ?Elevated troponin ?This is secondary to massive PE.  No additional work-up is needed ? ?Chronic systolic CHF (congestive heart failure) ([place]) ?Patient will be given IV Lasix for acute cor pulmonale ? ?COPD (chronic obstructive pulmonary disease) ([place]) ?No bronchospasm. ? ?Chronic atrial fibrillation with RVR ([place]) ?Continue heparin, changed to Eliquis at the time of discharge. ? ?Acute on chronic respiratory failure with hypoxia (H[facility]) ?Patient has been on heated high flow, currently still on 50% oxygen.  This is a secondary to acute PE, associated acute cor pulmonale with right-sided congestive heart failure. ?Continue current oxygen setting and wean oxygen gradually.  Giving IV Lasix. ? ?BMI 60.0-69.9, adult ([place]) ?Diet and exercise when condition is more stable ? ?Acquired hypothyroidism ?Synthroid. ? ? ? ? ?  ? ?Subjective:  ?Patient still requiring 50% of heated high flow.  She has shortness of breath with minimal exertion. ?She has no chest pain. ? ?Physical Exam: ?Vitals:  ? 07/16/21 0630 07/16/21 0700 07/16/21 0800 07/16/21 1137  ?BP:  108/65 111/65   ?Pulse: 96 97 93   ?Resp: 15 15 19    ?Temp:      ?TempSrc:      ?SpO2: 99% 97% 99% 91%  ?Weight:      ?Height:      ? ?General exam: Appears calm and comfortable, morbid obesity. ?Respiratory system: Clear to auscultation. Respiratory effort normal. ?Cardiovascular system: S1 & S2 heard, RRR. No JVD, murmurs, rubs, gallops or clicks. No pedal edema. ?Gastrointestinal system: Abdomen is nondistended, soft and nontender. No organomegaly or masses felt.  Normal bowel sounds heard. ?Central nervous system: Alert and oriented. No focal neurological deficits. ?Extremities: Symmetric 5 x 5 power. ?Skin: No rashes, lesions or ulcers ?Psychiatry: Judgement and insight appear normal. Mood & affect appropriate.  ? ?Data Reviewed: ? ?Reviewed the CT chest angiogram, echocardiogram performed on 02/13/2020, reviewed bilateral lower extremity duplex  ultrasound.  Reviewed all lab results. ? ?Family Communication: No need to talk to family per patient. ? ?Disposition: ?Status is: Inpatient ?Remains inpatient appropriate because: Severity of disease, high flow oxygen and IV treatment. ? Planned Discharge Destination: Home with Home Health ? ? ? ?Time spent: 45 minutes ? ?Author: ?Sharen Hones, MD ?07/16/2021 12:57 PM ? ?For on call review [email or web address].  ?

## 2021-07-16 NOTE — Assessment & Plan Note (Addendum)
Due to PE.  No additional work-up is needed. ?

## 2021-07-16 NOTE — Assessment & Plan Note (Addendum)
Transition to oral Eliquis today ?

## 2021-07-16 NOTE — Assessment & Plan Note (Addendum)
Blood pressure stable ? ?

## 2021-07-16 NOTE — Assessment & Plan Note (Addendum)
Patient CT scan had a submassive bilateral PE, associated with cor pulmonale.  Patient is status post pulmonary thrombectomy.  Due to large load of PE, patient is requiring high flow oxygen.  She also have evidence of right-sided congestive heart failure. ?Transition to Eliquis today ?Case management/pharmacist assisting due to cost of Eliquis and no insurance. ?Duplex ultrasound shows no lower extremity DVT ?

## 2021-07-16 NOTE — Progress Notes (Signed)
*  PRELIMINARY RESULTS* ?Echocardiogram ?2D Echocardiogram has been performed. Definity IV ultrasound enhancing agent used on this study. ? ?Katherine Scott ?07/16/2021, 11:41 AM ?

## 2021-07-17 DIAGNOSIS — N179 Acute kidney failure, unspecified: Secondary | ICD-10-CM

## 2021-07-17 LAB — CBC
HCT: 36.9 % (ref 36.0–46.0)
Hemoglobin: 11.2 g/dL — ABNORMAL LOW (ref 12.0–15.0)
MCH: 30 pg (ref 26.0–34.0)
MCHC: 30.4 g/dL (ref 30.0–36.0)
MCV: 98.9 fL (ref 80.0–100.0)
Platelets: 144 10*3/uL — ABNORMAL LOW (ref 150–400)
RBC: 3.73 MIL/uL — ABNORMAL LOW (ref 3.87–5.11)
RDW: 14 % (ref 11.5–15.5)
WBC: 10 10*3/uL (ref 4.0–10.5)
nRBC: 0 % (ref 0.0–0.2)

## 2021-07-17 LAB — BASIC METABOLIC PANEL
Anion gap: 6 (ref 5–15)
BUN: 31 mg/dL — ABNORMAL HIGH (ref 6–20)
CO2: 33 mmol/L — ABNORMAL HIGH (ref 22–32)
Calcium: 8.4 mg/dL — ABNORMAL LOW (ref 8.9–10.3)
Chloride: 100 mmol/L (ref 98–111)
Creatinine, Ser: 1.04 mg/dL — ABNORMAL HIGH (ref 0.44–1.00)
GFR, Estimated: >60 mL/min (ref >60)
Glucose, Bld: 125 mg/dL — ABNORMAL HIGH (ref 70–99)
Potassium: 4.1 mmol/L (ref 3.5–5.1)
Sodium: 139 mmol/L (ref 135–145)

## 2021-07-17 LAB — MAGNESIUM: Magnesium: 2.5 mg/dL — ABNORMAL HIGH (ref 1.7–2.4)

## 2021-07-17 LAB — HEPARIN LEVEL (UNFRACTIONATED): Heparin Unfractionated: 0.32 IU/mL (ref 0.30–0.70)

## 2021-07-17 MED ORDER — FUROSEMIDE 10 MG/ML IJ SOLN
40.0000 mg | Freq: Two times a day (BID) | INTRAMUSCULAR | Status: DC
  Administered 2021-07-17 – 2021-07-22 (×10): 40 mg via INTRAVENOUS
  Filled 2021-07-17 (×10): qty 4

## 2021-07-17 NOTE — Progress Notes (Addendum)
Patient has worsening productive cough. Coughing up blood-tinged/brown/tan sputum. Lungs sounds with upper inspiratory wheeze/diminished lower lung sounds. MD notified, requested RT evaluation & PRN albuterol per MD. Patient currently stable, watching television. Will continue to monitor. ?

## 2021-07-17 NOTE — Progress Notes (Signed)
ANTICOAGULATION CONSULT NOTE  ? ?Pharmacy Consult for Heparin drip ?Indication: pulmonary embolus ? ?Allergies  ?Allergen Reactions  ? Amiodarone   ?  Caused toxicity   ? ? ?Patient Measurements: ?Height: 5\' 2"  (157.5 cm) ?Weight: (!) 155.8 kg (343 lb 7.6 oz) ?IBW/kg (Calculated) : 50.1 ?Heparin Dosing Weight: 93 kg ? ?Vital Signs: ?Temp: 98.4 ?F (36.9 ?C) (04/09 0100) ?Temp Source: Axillary (04/09 0100) ?BP: 103/58 (04/09 0400) ?Pulse Rate: 103 (04/09 0400) ? ?Labs: ?Recent Labs  ?  07/15/21 ?0748 07/15/21 ?0935 07/15/21 ?1529 07/15/21 ?1731 07/15/21 ?1830 07/16/21 ?0031 07/16/21 ?[id number] 07/17/21 ?0543  ?HGB 12.6  --   --   --   --   --  11.5* 11.2*  ?HCT 41.3  --   --   --   --   --  37.8 36.9  ?PLT 174  --   --   --   --   --  152 144*  ?APTT 29  --   --   --  49*  --   --   --   ?LABPROT 14.2  --   --   --   --   --   --   --   ?INR 1.1  --   --   --   --   --   --   --   ?HEPARINUNFRC  --   --   --    < >  --  0.40 0.40 0.32  ?CREATININE 1.15*  --   --   --   --   --  1.13* 1.04*  ?TROPONINIHS 79* 88* 88*  --   --   --   --   --   ? < > = values in this interval not displayed.  ? ? ? ?Estimated Creatinine Clearance: 83.9 mL/min (A) (by C-G formula based on SCr of 1.04 mg/dL (H)). ? ? ?Medical History: ?Past Medical History:  ?Diagnosis Date  ? Abnormal mammogram, unspecified 2013  ? Atrial fibrillation ([place])   ? 2013  ? Breast screening, unspecified   ? CHF (congestive heart failure) ([place])   ? COPD (chronic obstructive pulmonary disease) ([place])   ? History of continuous positive airway pressure (CPAP) therapy at home   ? Hypothyroidism   ? Obesity, unspecified   ? Pneumonia   ? Screening for obesity   ? Sleep apnea   ? Special screening for malignant neoplasms, colon   ? Unspecified essential hypertension   ? ? ?Medications:  ?Medications Prior to Admission  ?Medication Sig Dispense Refill Last Dose  ? diltiazem (DILACOR XR) 240 MG 24 hr capsule Take 240 mg by mouth daily.   07/14/2021  ? fluticasone-salmeterol  (ADVAIR) 250-50 MCG/ACT AEPB Inhale 1 puff into the lungs in the morning and at bedtime.   07/15/2021  ? furosemide (LASIX) 40 MG tablet Take 2 tablets (80 mg total) by mouth daily. 180 tablet 3 07/14/2021  ? potassium chloride SA (K-DUR,KLOR-CON) 20 MEQ tablet Take 1 tablet (20 mEq total) by mouth daily. (Patient taking differently: Take 20 mEq by mouth at bedtime.) 30 tablet 6 07/14/2021  ? albuterol (VENTOLIN HFA) 108 (90 Base) MCG/ACT inhaler INHALE 2 PUFFS INTO THE LUNGS EVERY 6 (SIX) HOURS AS NEEDED FOR WHEEZING. 6.7 each 5 prn at unknown  ? amLODipine (NORVASC) 5 MG tablet amlodipine 5 mg tablet (Patient not taking: Reported on 01/17/2021)     ? cyanocobalamin 2000 MCG tablet Take 2,000 mcg by mouth daily.  (Patient not taking:  Reported on 07/15/2021)   Not Taking  ? doxycycline (VIBRA-TABS) 100 MG tablet Take 1 tablet (100 mg total) by mouth 2 (two) times daily. For 10 days. Take with full glass of water, stay upright 30 min after taking. (Patient not taking: Reported on 07/15/2021) 20 tablet 0 Not Taking  ? Fluticasone-Umeclidin-Vilant (TRELEGY ELLIPTA) 100-62.5-25 MCG/INH AEPB Inhale 1 Inhaler into the lungs daily. (Patient not taking: Reported on 07/15/2021) 180 each 4 Not Taking  ? levothyroxine (SYNTHROID) 75 MCG tablet TAKE 1 TABLET BY MOUTH DAILY BEFORE BREAKFAST. WILL NEED THYROID LAB & FOLLOW-UP FOR FURTHER REFILLS (Patient not taking: Reported on 07/15/2021) 90 tablet 0 Not Taking  ? metoprolol succinate (TOPROL-XL) 25 MG 24 hr tablet Take 3 tablets (75 mg total) by mouth daily. (Patient not taking: Reported on 07/15/2021) 90 tablet 1 Not Taking  ? OXYGEN Inhale 2 L/L into the lungs as needed.      ? predniSONE (DELTASONE) 10 MG tablet Take 6 tabs on day 1, 5 tabs on day 2, 4 tabs on day 3, 3 tabs on day 4, 2 tabs on day 5, 1 tab on day 6 (Patient not taking: Reported on 07/15/2021) 21 tablet 0 Not Taking  ? rivaroxaban (XARELTO) 20 MG TABS tablet Xarelto 20 mg tablet (Patient not taking: Reported on 07/15/2021)   Not  Taking  ? Spacer/Aero-Holding Chambers DEVI 1 Device by Does not apply route as needed. 1 each 0   ? ?Scheduled:  ? Chlorhexidine Gluconate Cloth  6 each Topical Q0600  ? diltiazem  240 mg Oral Daily  ? furosemide  80 mg Oral Daily  ? ipratropium-albuterol  3 mL Nebulization Q4H  ? mometasone-formoterol  2 puff Inhalation BID  ? ?Infusions:  ? diltiazem (CARDIZEM) infusion Stopped (07/15/21 1837)  ? heparin 1,650 Units/hr (07/17/21 0400)  ? ? ?Assessment: ?61 yo F to start Heparin drip for PE+. ?Hx of Afib- pt was on Xarelto then changed to apixaban ,..but has not been taking for a couple of months due to cost (per ED MD note/discussion w/ Med Rec Tech) ?Hgb 12.6  plt 174  INR 1.1  aPTT 29 ? ?0407 @1731  HL= 0.27  (pt had thrombectomy/tPA, heparin drip stopped and restarted at 1430, so this level is only 3 hrs after restart.) ?4/08 0031 HL 0.40, therapeutic x 1 ?4/08 0633 HL 0.40 therapeutic x 2 ?4/09 0543 HL 0.32, therapeutic x 3 ? ?Goal of Therapy:  ?S/p tPA: Goal HL=0.3-0.5 x 24 hrs ,then 0.3-0.7. ?Heparin level 0.3-0.7 units/ml ?Monitor platelets by anticoagulation protocol: Yes ?  ?Plan:  ?Continue Heparin drip at 1650 units /hr ?Recheck HL daily w/ AM labs while therapeutic ?CBC daily ? ?Renda Rolls, PharmD, MBA ?07/17/2021 ?6:56 AM ? ? ? ? ?

## 2021-07-17 NOTE — Progress Notes (Signed)
?Progress Note ? ? ?Patient: Katherine Scott BHA:[id number] DOB: 25-May-1960 DOA: 07/15/2021     2 ?DOS: the patient was seen and examined on 07/17/2021 ?  ?Brief hospital course: ? DAVETTA OLLIFF is a 61 y.o. female with medical history significant of hypertension, COPD on 1 L oxygen, OSA on CPAP, hypothyroidism, morbid obesity with a BMI 66.49, sCHF with EF 45-50%, atrial fibrillation (stopped taking Eliquis more than 2 months ago), who presents with shortness of breath. ?CT angiogram of the chest showed a submassive PE with evidence of acute right heart strain.  Patient was given IV heparin.  Pulmonary thrombectomy was performed on 4/7. ? ?Assessment and Plan: ?* Pulmonary embolism with acute cor pulmonale ([facility]) ?Patient CT scan had a submassive bilateral PE, associated with cor pulmonale.  Patient is status post pulmonary thrombectomy.  Due to large load of PE, patient is requiring high flow oxygen.  She also have evidence of right-sided congestive heart failure, she is receiving IV diuretics.  We will continue furosemide 40 mg twice a day. ?Her oxygenation seems to be improving, she is at 45% of heated high flow.  We will transfer to progressive unit. ? ? ?OSA (obstructive sleep apnea) ?Patient has been diagnosed with obstructive sleep apnea.  She was on CPAP for several years until few months ago, she took off CPAP at that time. ?Patient will be on BiPAP at nighttime, will obtain overnight oximetry when patient condition is more stable to qualify her again for a CPAP. ? ?HTN (hypertension) ?Continue some home medicines. ? ?AKI (acute kidney injury) (HCC) ?Reviewed prior lab, also looking to [facility] system record, most recent outpatient lab was performed in 02/2020, at that time renal function was normal.  Renal function is gradually improving.  Continue to follow. ? ?Elevated troponin ?This is secondary to massive PE.  No additional work-up is needed ? ?Chronic systolic CHF (congestive heart failure) (H[facility]) ?She has volume  overload due to acute cor pulmonale.  Continue IV Lasix. ? ?COPD (chronic obstructive pulmonary disease) (HCC) ?No bronchospasm. ? ?Chronic atrial fibrillation with RVR (HCC) ?Continue heparin, changed to Eliquis at the time of discharge. ? ?Acute on chronic respiratory failure with hypoxia ([facility]) ?Patient currently on 45% of oxygen on heated high flow, continue wean oxygen. ? ?BMI 60.0-69.9, adult ([facility]) ?Diet and exercise when condition is more stable ? ?Acquired hypothyroidism ?Synthroid. ? ? ? ? ?  ? ?Subjective:  ?Patient doing better today, still on 45% of heated high flow, but she does not feel any short of breath. ?No abdominal pain nausea vomiting. ? ?Physical Exam: ?Vitals:  ? 07/17/21 0800 07/17/21 0900 07/17/21 0933 07/17/21 1000  ?BP: (!) 110/59 (!) 82/36 104/63 (!) 117/59  ?Pulse: 91 94 82 (!) 157  ?Resp: 20 (!) 21 (!) 23 (!) 21  ?Temp: 98.7 ?F (37.1 ?C)     ?TempSrc: Oral     ?SpO2: 93% (!) 85% 93% 93%  ?Weight:      ?Height:      ? ?General exam: Appears calm and comfortable, morbid obese. ?Respiratory system: Clear to auscultation. Respiratory effort normal. ?Cardiovascular system: S1 & S2 heard, RRR. No JVD, murmurs, rubs, gallops or clicks. No pedal edema. ?Gastrointestinal system: Abdomen is nondistended, soft and nontender. No organomegaly or masses felt. Normal bowel sounds heard. ?Central nervous system: Alert and oriented. No focal neurological deficits. ?Extremities: Symmetric 5 x 5 power. ?Skin: No rashes, lesions or ulcers ?Psychiatry: Judgement and insight appear normal. Mood & affect appropriate.  ? ?  Data Reviewed: ? ?Lab results reviewed. ? ?Family Communication: Son updated at bedside. ? ?Disposition: ?Status is: Inpatient ?Remains inpatient appropriate because: Severity of disease, on heated high flow.  On IV heparin. ? Planned Discharge Destination: Home with Home Health ? ? ? ?Time spent: 40 minutes ? ?Author: ?Marrion Coy, MD ?07/17/2021 10:52 AM ? ?For on call review [email or web address].  ?

## 2021-07-18 ENCOUNTER — Other Ambulatory Visit: Payer: Self-pay

## 2021-07-18 ENCOUNTER — Inpatient Hospital Stay: Payer: Medicaid Other

## 2021-07-18 LAB — BASIC METABOLIC PANEL
Anion gap: 8 (ref 5–15)
BUN: 26 mg/dL — ABNORMAL HIGH (ref 6–20)
CO2: 34 mmol/L — ABNORMAL HIGH (ref 22–32)
Calcium: 8.6 mg/dL — ABNORMAL LOW (ref 8.9–10.3)
Chloride: 99 mmol/L (ref 98–111)
Creatinine, Ser: 1.04 mg/dL — ABNORMAL HIGH (ref 0.44–1.00)
GFR, Estimated: >60 mL/min (ref >60)
Glucose, Bld: 112 mg/dL — ABNORMAL HIGH (ref 70–99)
Potassium: 3.7 mmol/L (ref 3.5–5.1)
Sodium: 141 mmol/L (ref 135–145)

## 2021-07-18 LAB — CBC
HCT: 35.6 % — ABNORMAL LOW (ref 36.0–46.0)
Hemoglobin: 10.9 g/dL — ABNORMAL LOW (ref 12.0–15.0)
MCH: 30 pg (ref 26.0–34.0)
MCHC: 30.6 g/dL (ref 30.0–36.0)
MCV: 98.1 fL (ref 80.0–100.0)
Platelets: 164 10*3/uL (ref 150–400)
RBC: 3.63 MIL/uL — ABNORMAL LOW (ref 3.87–5.11)
RDW: 14.1 % (ref 11.5–15.5)
WBC: 7 10*3/uL (ref 4.0–10.5)
nRBC: 0 % (ref 0.0–0.2)

## 2021-07-18 LAB — URINE CULTURE: Culture: 30000 — AB

## 2021-07-18 LAB — MAGNESIUM: Magnesium: 2.4 mg/dL (ref 1.7–2.4)

## 2021-07-18 LAB — HEPARIN LEVEL (UNFRACTIONATED)
Heparin Unfractionated: 0.23 IU/mL — ABNORMAL LOW (ref 0.30–0.70)
Heparin Unfractionated: 0.24 IU/mL — ABNORMAL LOW (ref 0.30–0.70)

## 2021-07-18 LAB — PROCALCITONIN: Procalcitonin: <0.1 ng/mL

## 2021-07-18 LAB — GLUCOSE, CAPILLARY: Glucose-Capillary: 189 mg/dL — ABNORMAL HIGH (ref 70–99)

## 2021-07-18 MED ORDER — APIXABAN 5 MG PO TABS
5.0000 mg | ORAL_TABLET | Freq: Two times a day (BID) | ORAL | 0 refills | Status: AC
  Filled 2021-07-18 – 2021-07-26 (×3): qty 60, 30d supply, fill #0

## 2021-07-18 MED ORDER — HEPARIN BOLUS VIA INFUSION
1400.0000 [IU] | Freq: Once | INTRAVENOUS | Status: AC
  Administered 2021-07-18: 1400 [IU] via INTRAVENOUS
  Filled 2021-07-18: qty 1400

## 2021-07-18 MED ORDER — OXYCODONE-ACETAMINOPHEN 5-325 MG PO TABS
1.0000 | ORAL_TABLET | ORAL | Status: DC | PRN
  Administered 2021-07-18 – 2021-07-19 (×8): 1 via ORAL
  Filled 2021-07-18 (×9): qty 1

## 2021-07-18 MED ORDER — APIXABAN 5 MG PO TABS
10.0000 mg | ORAL_TABLET | Freq: Two times a day (BID) | ORAL | 0 refills | Status: DC
  Filled 2021-07-18: qty 28, 7d supply, fill #0
  Filled ????-??-??: fill #1

## 2021-07-18 NOTE — Progress Notes (Signed)
Patient is c/o pain while ambualting. States pain in lateral anterior portion of left foot. 6/10 pain scale. she states the pain started throughout the night, describes as a shooting pain in a line along edge of foot, patient had difficulty putting weight on foot when transferring to [facility]. Foot is warmer to touch, no new swelling or color changes. MD notified. PRN medication given per order, Xray ordered. ?

## 2021-07-18 NOTE — Progress Notes (Signed)
ANTICOAGULATION CONSULT NOTE  ? ?Pharmacy Consult for Heparin drip ?Indication: pulmonary embolus ? ?Allergies  ?Allergen Reactions  ? Amiodarone   ?  Caused toxicity   ? ? ?Patient Measurements: ?Height: 5\' 2"  (157.5 cm) ?Weight: (!) 164 kg (361 lb 8.9 oz) (Discrepency in weight, bed zeroed and patient weight recorded previous shift. Todays weight creates another discrepency but is more consistant with previous weights.) ?IBW/kg (Calculated) : 50.1 ?Heparin Dosing Weight: 93 kg ? ?Vital Signs: ?Temp: 98.7 ?F (37.1 ?C) (04/09 2053) ?Temp Source: Oral (04/09 2053) ?BP: 111/62 (04/10 0600) ?Pulse Rate: 95 (04/10 0700) ? ?Labs: ?Recent Labs  ?  07/15/21 ?0935 07/15/21 ?1529 07/15/21 ?1731 07/15/21 ?1830 07/16/21 ?0031 07/16/21 ?[id number] 07/17/21 ?[id number] 07/18/21 ?0606  ?HGB  --   --   --   --    < > 11.5* 11.2* 10.9*  ?HCT  --   --   --   --   --  37.8 36.9 35.6*  ?PLT  --   --   --   --   --  152 144* 164  ?APTT  --   --   --  77*  --   --   --   --   ?HEPARINUNFRC  --   --    < >  --    < > 0.40 0.32 0.23*  ?CREATININE  --   --   --   --   --  1.13* 1.04* 1.04*  ?TROPONINIHS 88* 88*  --   --   --   --   --   --   ? < > = values in this interval not displayed.  ? ? ? ?Estimated Creatinine Clearance: 86.9 mL/min (A) (by C-G formula based on SCr of 1.04 mg/dL (H)). ? ? ?Medical History: ?Past Medical History:  ?Diagnosis Date  ? Abnormal mammogram, unspecified 2013  ? Atrial fibrillation ([place])   ? 2013  ? Breast screening, unspecified   ? CHF (congestive heart failure) ([place])   ? COPD (chronic obstructive pulmonary disease) ([place])   ? History of continuous positive airway pressure (CPAP) therapy at home   ? Hypothyroidism   ? Obesity, unspecified   ? Pneumonia   ? Screening for obesity   ? Sleep apnea   ? Special screening for malignant neoplasms, colon   ? Unspecified essential hypertension   ? ? ?Medications:  ?Medications Prior to Admission  ?Medication Sig Dispense Refill Last Dose  ? diltiazem (DILACOR XR) 240 MG 24 hr  capsule Take 240 mg by mouth daily.   07/14/2021  ? fluticasone-salmeterol (ADVAIR) 250-50 MCG/ACT AEPB Inhale 1 puff into the lungs in the morning and at bedtime.   07/15/2021  ? furosemide (LASIX) 40 MG tablet Take 2 tablets (80 mg total) by mouth daily. 180 tablet 3 07/14/2021  ? potassium chloride SA (K-DUR,KLOR-CON) 20 MEQ tablet Take 1 tablet (20 mEq total) by mouth daily. (Patient taking differently: Take 20 mEq by mouth at bedtime.) 30 tablet 6 07/14/2021  ? albuterol (VENTOLIN HFA) 108 (90 Base) MCG/ACT inhaler INHALE 2 PUFFS INTO THE LUNGS EVERY 6 (SIX) HOURS AS NEEDED FOR WHEEZING. 6.7 each 5 prn at unknown  ? amLODipine (NORVASC) 5 MG tablet amlodipine 5 mg tablet (Patient not taking: Reported on 01/17/2021)     ? cyanocobalamin 2000 MCG tablet Take 2,000 mcg by mouth daily.  (Patient not taking: Reported on 07/15/2021)   Not Taking  ? doxycycline (VIBRA-TABS) 100 MG tablet Take 1 tablet (100 mg  total) by mouth 2 (two) times daily. For 10 days. Take with full glass of water, stay upright 30 min after taking. (Patient not taking: Reported on 07/15/2021) 20 tablet 0 Not Taking  ? Fluticasone-Umeclidin-Vilant (TRELEGY ELLIPTA) 100-62.5-25 MCG/INH AEPB Inhale 1 Inhaler into the lungs daily. (Patient not taking: Reported on 07/15/2021) 180 each 4 Not Taking  ? levothyroxine (SYNTHROID) 75 MCG tablet TAKE 1 TABLET BY MOUTH DAILY BEFORE BREAKFAST. WILL NEED THYROID LAB & FOLLOW-UP FOR FURTHER REFILLS (Patient not taking: Reported on 07/15/2021) 90 tablet 0 Not Taking  ? metoprolol succinate (TOPROL-XL) 25 MG 24 hr tablet Take 3 tablets (75 mg total) by mouth daily. (Patient not taking: Reported on 07/15/2021) 90 tablet 1 Not Taking  ? OXYGEN Inhale 2 L/L into the lungs as needed.      ? predniSONE (DELTASONE) 10 MG tablet Take 6 tabs on day 1, 5 tabs on day 2, 4 tabs on day 3, 3 tabs on day 4, 2 tabs on day 5, 1 tab on day 6 (Patient not taking: Reported on 07/15/2021) 21 tablet 0 Not Taking  ? rivaroxaban (XARELTO) 20 MG TABS  tablet Xarelto 20 mg tablet (Patient not taking: Reported on 07/15/2021)   Not Taking  ? Spacer/Aero-Holding Chambers DEVI 1 Device by Does not apply route as needed. 1 each 0   ? ?Scheduled:  ? Chlorhexidine Gluconate Cloth  6 each Topical Q0600  ? diltiazem  240 mg Oral Daily  ? furosemide  40 mg Intravenous Q12H  ? ipratropium-albuterol  3 mL Nebulization Q4H  ? mometasone-formoterol  2 puff Inhalation BID  ? ?Infusions:  ? diltiazem (CARDIZEM) infusion Stopped (07/15/21 1837)  ? heparin 1,650 Units/hr (07/18/21 0700)  ? ? ?Assessment: ?61 year old female presenting with shortness of breath. PMH includes significant of hypertension, COPD on 1 L oxygen, OSA on CPAP, hypothyroidism, morbid obesity with a BMI 66.49, sCHF with EF 45-50%, atrial fibrillation (stopped taking Eliquis more than 2 months ago d/t cost). Pharmacy has been consulted to dose heparin for massive pulmonary embolism. ? ?Hgb 12.6  plt 174  INR 1.1  aPTT 29 ? ?0407 @1731  HL= 0.27  (pt had thrombectomy/tPA, heparin drip stopped and restarted at 1430, so this level is only 3 hrs after restart.) ?4/08 0031 HL 0.40, therapeutic x 1 ?4/08 0633 HL 0.40 therapeutic x 2 ?4/09 0543 HL 0.32, therapeutic x 3 ?4/10 0606 HL 0.23, subtherapeutic ? ?Per RN, line briefly pulled overnight, new IV placed.   ? ?Goal of Therapy:  ?S/p tPA: Goal HL=0.3-0.5 x 24 hrs ,then 0.3-0.7. ?Heparin level 0.3-0.7 units/ml ?Monitor platelets by anticoagulation protocol: Yes ?  ?Plan: heparin sub-therapeutic ?Give bolus 1400 units x 1. Increase heparin drip to 1850 units hr ?6 hour heparin level ?CBC daily ? ? ?Wynelle Cleveland, PharmD ?Pharmacy Resident  ?07/18/2021 ?8:07 AM ? ? ? ?

## 2021-07-18 NOTE — Progress Notes (Signed)
Patient  c/o pain with no significant improvement from interventions.  Left foot is warmer than previously and temp different then the right foot. Warmth is radiating up heel/ankle area. Requested MD to come evaluate. MD at bedside, no additional interventions at this time. Will continue to monitor and treat pain per orders. ?

## 2021-07-18 NOTE — TOC Initial Note (Signed)
Transition of Care (TOC) - Initial/Assessment Note  ? ? ?Patient Details  ?Name: Katherine Scott ?MRN: [id number] ?Date of Birth: [birth year] ? ?Transition of Care (TOC) CM/SW Contact:    ?Allayne Butcher, RN ?Phone Number: ?07/18/2021, 2:46 PM ? ?Clinical Narrative:                 ?Patient admitted to the hospital with acute on chronic respiratory failure, acute PE, acute on chronic CHF.   ?Patient wears oxygen at home 2L [place] from [facility]- she reports initially just at night but before coming to the hospital was having to wear it all the time.  Reached out to [facility]- they do have her as a patient Self Pay but she also has a large balance with them so they cannot offer any additional equipment.  Patient would benefit from Bipap at discharge, she is currently requring HFNC 40L 40%.  Referral given to [facility] with [employer] for Bipap- he requires a 3 month LOG.  RNCM will reach out to department leadership to see if 3 month LOG can be offered.  ? ?Patient is from home with her daughter.  She is independent at home and drives.  She is current with Dr. Althea Charon in [place].  She has not been taking her medications like she should because she cannot afford them.  Patient does not currently have any insurance, she applied for disability in Aug. 2021, she has a Regulatory affairs officer, heard from them the other day- it is still pending, they want to come to her and do their own assessment for disability. ? ?[employer] saw patient at the bedside today and they will apply for Medicaid for patient as soon as she discharges.   ? ?TOC will cont to follow and work on getting Bipap at discharge.   ? ?Expected Discharge Plan: Home/Self Care ?Barriers to Discharge: Continued Medical Work up ? ? ?Patient Goals and CMS Choice ?Patient states their goals for this hospitalization and ongoing recovery are:: To be able to breath ?  ?  ? ?Expected Discharge Plan and Services ?Expected Discharge Plan: Home/Self Care ?  ?Discharge Planning Services: CM  Consult ?  ?Living arrangements for the past 2 months: Single Family Home ?                ?DME Arranged: Bipap ?DME Agency: [employer] ?Date DME Agency Contacted: 07/18/21 ?Time DME Agency Contacted: [phone] ?Representative spoke with at DME Agency: Oletha Cruel ?HH Arranged: NA ?HH Agency: NA ?  ?  ?  ? ?Prior Living Arrangements/Services ?Living arrangements for the past 2 months: Single Family Home ?Lives with:: Adult Children (daughter Rachael Fee) ?Patient language and need for interpreter reviewed:: Yes ?Do you feel safe going back to the place where you live?: Yes      ?Need for Family Participation in Patient Care: Yes (Comment) ?Care giver support system in place?: Yes (comment) (daughter, sister) ?Current home services: DME (oxygen) ?Criminal Activity/Legal Involvement Pertinent to Current Situation/Hospitalization: No - Comment as needed ? ?Activities of Daily Living ?Home Assistive Devices/Equipment: Oxygen, Cane (specify quad or straight), CPAP, Eyeglasses, Walker (specify type) ?ADL Screening (condition at time of admission) ?Patient's cognitive ability adequate to safely complete daily activities?: Yes ?Is the patient deaf or have difficulty hearing?: No ?Does the patient have difficulty seeing, even when wearing glasses/contacts?: No ?Does the patient have difficulty concentrating, remembering, or making decisions?: No ?Patient able to express need for assistance with ADLs?: Yes ?Does the patient have difficulty dressing or bathing?:  No ?Independently performs ADLs?: Yes (appropriate for developmental age) ?Does the patient have difficulty walking or climbing stairs?: No ?Weakness of Legs: None ?Weakness of Arms/Hands: None ? ?Permission Sought/Granted ?Permission sought to share information with : Case Manager, Family Supports ?Permission granted to share information with : Yes, Verbal Permission Granted ? Share Information with NAME: Amy Beverely Pace ? Permission granted to share info w AGENCY:  [employer] ? Permission granted to share info w Relationship: sister ? Permission granted to share info w Contact Information: [phone] ? ?Emotional Assessment ?Appearance:: Appears older than stated age ?Attitude/Demeanor/Rapport: Engaged ?Affect (typically observed): Accepting ?Orientation: : Oriented to Self, Oriented to Place, Oriented to  Time, Oriented to Situation ?Alcohol / Substance Use: Not Applicable ?Psych Involvement: No (comment) ? ?Admission diagnosis:  Positive D dimer [R79.89] ?Bilateral pulmonary embolism (HCC) [I26.99] ?Elevated brain natriuretic peptide (BNP) level [R79.89] ?Troponin I above reference range [R77.8] ?Atrial fibrillation with RVR (HCC) [I48.91] ?Acute on chronic respiratory failure with hypoxia (HCC) [J96.21] ?Pneumonia of both lungs due to infectious organism, unspecified part of lung [J18.9] ?Acute pulmonary embolism with acute cor pulmonale, unspecified pulmonary embolism type (HCC) [I26.09] ?Patient Active Problem List  ? Diagnosis Date Noted  ? OSA (obstructive sleep apnea) 07/16/2021  ? Pulmonary embolism with acute cor pulmonale (HCC) 07/15/2021  ? Chronic atrial fibrillation with RVR (HCC) 07/15/2021  ? COPD (chronic obstructive pulmonary disease) (HCC) 07/15/2021  ? Chronic systolic CHF (congestive heart failure) (HCC) 07/15/2021  ? Elevated troponin 07/15/2021  ? AKI (acute kidney injury) (HCC) 07/15/2021  ? HTN (hypertension) 07/15/2021  ? Atrial fibrillation with RVR (HCC)   ? Congestive heart failure (HCC) 08/02/2020  ? Closed fracture of proximal end of left humerus 04/15/2020  ? Shortness of breath 02/12/2020  ? COPD with acute exacerbation (HCC) 02/12/2020  ? Pulmonary edema 02/12/2020  ? Venous ulcer (HCC) 10/09/2019  ? Acute on chronic respiratory failure with hypoxia (HCC) 03/20/2019  ? Lymphedema 02/26/2017  ? 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 Acquired hypothyroidism 08/11/2016  ? BMI 60.0-69.9, adult (HCC) 08/11/2016  ? Persistent atrial  fibrillation (HCC) 06/17/2014  ? ?PCP:  Smitty Cords, DO ?Pharmacy:   ?[facility] ?[address] ?[place] [address] ?Phone: [phone] Fax: [phone] ? ? ? ? ?Social Determinants of Health (SDOH) Interventions ?  ? ?Readmission Risk Interventions ? ?  07/18/2021  ?  2:12 PM 02/16/2020  ?  1:56 PM 02/08/2019  ? 12:55 PM  ?Readmission Risk Prevention Plan  ?Post Dischage Appt   Complete  ?Medication Screening   Complete  ?Transportation Screening Complete Complete Complete  ?PCP or Specialist Appt within 5-7 Days Complete Not Complete   ?Home Care Screening Complete Not Complete   ?Medication Review (RN CM) Complete Complete   ? ? ? ?

## 2021-07-18 NOTE — Progress Notes (Signed)
Patient c/o of severe pain that is radiating to ankle/lower calf. Skin pink./red, warm and painful to touch, cap refill <3 sec. MD notified, new orders placed. Patient resting in bed, Will continue to monitor and treat pain per orders.  ?

## 2021-07-18 NOTE — Progress Notes (Signed)
?Progress Note ? ? ?Patient: Katherine Scott BMW:[id number] DOB: [birth year] DOA: 07/15/2021     3 ?DOS: the patient was seen and examined on 07/18/2021 ?  ?Brief hospital course: ? Katherine Scott is a 61 y.o. female with medical history significant of hypertension, COPD on 1 L oxygen, OSA on CPAP, hypothyroidism, morbid obesity with a BMI 66.49, sCHF with EF 45-50%, atrial fibrillation (stopped taking Eliquis more than 2 months ago), who presents with shortness of breath. ?CT angiogram of the chest showed a submassive PE with evidence of acute right heart strain.  Patient was given IV heparin.  Pulmonary thrombectomy was performed on 4/7. ? ?Assessment and Plan: ?* Pulmonary embolism with acute cor pulmonale (HCC) ?Patient CT scan had a submassive bilateral PE, associated with cor pulmonale.  Patient is status post pulmonary thrombectomy.  Due to large load of PE, patient is requiring high flow oxygen.  She also have evidence of right-sided congestive heart failure. ?Due to severity of disease, I will continue heparin drip, continue another day of IV Lasix. ?Patient has more mucus production today, procalcitonin level still less than 0.1, no secondary bacterial infection. ? ? ?OSA (obstructive sleep apnea) ?Patient has been diagnosed with obstructive sleep apnea.  She was on CPAP for several years until few months ago, she took off CPAP for a few months before admission. ?Patient currently will be on high flow oxygen while BiPAP at nighttime.  We will need to set up home use BiPAP before discharge ? ?HTN (hypertension) ?Blood pressure stable. ? ?AKI (acute kidney injury) (HCC) ?Reviewed prior lab, also looking to [facility] system record, most recent outpatient lab was performed in 02/2020, at that time renal function was normal.  GFR has improved. ? ?Elevated troponin ?Due to PE.  No additional work-up is needed. ? ?Chronic systolic CHF (congestive heart failure) (HCC) ?She has volume overload due to acute cor pulmonale.   Continue IV Lasix. ? ?COPD (chronic obstructive pulmonary disease) (HCC) ?Continue bronchodilators ? ?Chronic atrial fibrillation with RVR (HCC) ?Continue heparin, changed to Eliquis at the time of discharge. ? ?Acute on chronic respiratory failure with hypoxia (HCC) ?Continue heated high flow, BiPAP at nighttime. ? ?BMI 60.0-69.9, adult (HCC) ?Diet and exercise when condition is more stable ? ?Acquired hypothyroidism ?Synthroid. ? ? ? ? ?  ? ?Subjective:  ?Patient currently on 40% Heated high flow, no short of breath.  She has a cough, with a white mucus with minimal blood mixed with mucus.  No chest pain. ?Has some left ankle pain, x-ray did not show any acute changes.  No redness or swelling. ?Patient was able to sit in the chair yesterday. ? ?Physical Exam: ?Vitals:  ? 07/18/21 1000 07/18/21 1022 07/18/21 1155 07/18/21 1200  ?BP: (!) 150/102   104/70  ?Pulse:  (!) 118    ?Resp:  18  17  ?Temp:      ?TempSrc:      ?SpO2:  100% 93%   ?Weight:      ?Height:      ? ?General exam: Appears calm and comfortable, morbid obese ?Respiratory system: Clear to auscultation. Respiratory effort normal. ?Cardiovascular system: S1 & S2 heard, RRR. No JVD, murmurs, rubs, gallops or clicks. No pedal edema. ?Gastrointestinal system: Abdomen is nondistended, soft and nontender. No organomegaly or masses felt. Normal bowel sounds heard. ?Central nervous system: Alert and oriented. No focal neurological deficits. ?Extremities: Symmetric 5 x 5 power. ?Skin: No rashes, lesions or ulcers ?Psychiatry: Judgement and insight appear normal. Mood &  affect appropriate.  ? ?Data Reviewed: ? ?Lab results reviewed ?Ankle x-ray reviewed ? ?Family Communication:  ? ?Disposition: ?Status is: Inpatient ?Remains inpatient appropriate because: Severity of disease and IV treatment. ? Planned Discharge Destination: Home with Home Health ? ? ? ?Time spent: 28 minutes ? ?Author: ?Marrion Coy, MD ?07/18/2021 12:56 PM ? ?For on call review [email or web address].  ?

## 2021-07-18 NOTE — Progress Notes (Signed)
ANTICOAGULATION CONSULT NOTE  ? ?Pharmacy Consult for Heparin drip ?Indication: pulmonary embolus ? ?Allergies  ?Allergen Reactions  ? Amiodarone   ?  Caused toxicity   ? ? ?Patient Measurements: ?Height: 5\' 2"  (157.5 cm) ?Weight: (!) 156.5 kg (345 lb) (per patient) ?IBW/kg (Calculated) : 50.1 ?Heparin Dosing Weight: 93 kg ? ?Vital Signs: ?Temp: 99.1 ?F (37.3 ?C) (04/10 1728) ?Temp Source: Oral (04/10 1728) ?BP: 140/79 (04/10 1624) ?Pulse Rate: 96 (04/10 1624) ? ?Labs: ?Recent Labs  ?  07/15/21 ?1830 07/16/21 ?0031 07/16/21 ?[id number] 07/17/21 ?0543 07/18/21 ?0606 07/18/21 ?1512  ?HGB  --    < > 11.5* 11.2* 10.9*  --   ?HCT  --   --  37.8 36.9 35.6*  --   ?PLT  --   --  152 144* 164  --   ?APTT 49*  --   --   --   --   --   ?HEPARINUNFRC  --    < > 0.40 0.32 0.23* 0.24*  ?CREATININE  --   --  1.13* 1.04* 1.04*  --   ? < > = values in this interval not displayed.  ? ? ? ?Estimated Creatinine Clearance: 84.2 mL/min (A) (by C-G formula based on SCr of 1.04 mg/dL (H)). ? ? ?Medical History: ?Past Medical History:  ?Diagnosis Date  ? Abnormal mammogram, unspecified 2013  ? Atrial fibrillation ([place])   ? 2013  ? Breast screening, unspecified   ? CHF (congestive heart failure) ([place])   ? COPD (chronic obstructive pulmonary disease) ([place])   ? History of continuous positive airway pressure (CPAP) therapy at home   ? Hypothyroidism   ? Obesity, unspecified   ? Pneumonia   ? Screening for obesity   ? Sleep apnea   ? Special screening for malignant neoplasms, colon   ? Unspecified essential hypertension   ? ? ?Medications:  ?Medications Prior to Admission  ?Medication Sig Dispense Refill Last Dose  ? diltiazem (DILACOR XR) 240 MG 24 hr capsule Take 240 mg by mouth daily.   07/14/2021  ? fluticasone-salmeterol (ADVAIR) 250-50 MCG/ACT AEPB Inhale 1 puff into the lungs in the morning and at bedtime.   07/15/2021  ? furosemide (LASIX) 40 MG tablet Take 2 tablets (80 mg total) by mouth daily. 180 tablet 3 07/14/2021  ? potassium chloride SA  (K-DUR,KLOR-CON) 20 MEQ tablet Take 1 tablet (20 mEq total) by mouth daily. (Patient taking differently: Take 20 mEq by mouth at bedtime.) 30 tablet 6 07/14/2021  ? albuterol (VENTOLIN HFA) 108 (90 Base) MCG/ACT inhaler INHALE 2 PUFFS INTO THE LUNGS EVERY 6 (SIX) HOURS AS NEEDED FOR WHEEZING. 6.7 each 5 prn at unknown  ? amLODipine (NORVASC) 5 MG tablet amlodipine 5 mg tablet (Patient not taking: Reported on 01/17/2021)     ? cyanocobalamin 2000 MCG tablet Take 2,000 mcg by mouth daily.  (Patient not taking: Reported on 07/15/2021)   Not Taking  ? doxycycline (VIBRA-TABS) 100 MG tablet Take 1 tablet (100 mg total) by mouth 2 (two) times daily. For 10 days. Take with full glass of water, stay upright 30 min after taking. (Patient not taking: Reported on 07/15/2021) 20 tablet 0 Not Taking  ? Fluticasone-Umeclidin-Vilant (TRELEGY ELLIPTA) 100-62.5-25 MCG/INH AEPB Inhale 1 Inhaler into the lungs daily. (Patient not taking: Reported on 07/15/2021) 180 each 4 Not Taking  ? levothyroxine (SYNTHROID) 75 MCG tablet TAKE 1 TABLET BY MOUTH DAILY BEFORE BREAKFAST. WILL NEED THYROID LAB & FOLLOW-UP FOR FURTHER REFILLS (Patient not taking: Reported on  07/15/2021) 90 tablet 0 Not Taking  ? metoprolol succinate (TOPROL-XL) 25 MG 24 hr tablet Take 3 tablets (75 mg total) by mouth daily. (Patient not taking: Reported on 07/15/2021) 90 tablet 1 Not Taking  ? OXYGEN Inhale 2 L/L into the lungs as needed.      ? predniSONE (DELTASONE) 10 MG tablet Take 6 tabs on day 1, 5 tabs on day 2, 4 tabs on day 3, 3 tabs on day 4, 2 tabs on day 5, 1 tab on day 6 (Patient not taking: Reported on 07/15/2021) 21 tablet 0 Not Taking  ? rivaroxaban (XARELTO) 20 MG TABS tablet Xarelto 20 mg tablet (Patient not taking: Reported on 07/15/2021)   Not Taking  ? Spacer/Aero-Holding Chambers DEVI 1 Device by Does not apply route as needed. 1 each 0   ? ?Scheduled:  ? Chlorhexidine Gluconate Cloth  6 each Topical Q0600  ? diltiazem  240 mg Oral Daily  ? furosemide  40 mg  Intravenous Q12H  ? ipratropium-albuterol  3 mL Nebulization Q4H  ? mometasone-formoterol  2 puff Inhalation BID  ? ?Infusions:  ? heparin 1,850 Units/hr (07/18/21 1714)  ? ? ?Assessment: ?61 year old female presenting with shortness of breath. PMH includes significant of hypertension, COPD on 1 L oxygen, OSA on CPAP, hypothyroidism, morbid obesity with a BMI 66.49, sCHF with EF 45-50%, atrial fibrillation (stopped taking Eliquis more than 2 months ago d/t cost). Pharmacy has been consulted to dose heparin for massive pulmonary embolism. ? ?Hgb 12.6  plt 174  INR 1.1  aPTT 29 ? ?0407 @1731  HL= 0.27  (pt had thrombectomy/tPA, heparin drip stopped and restarted at 1430, so this level is only 3 hrs after restart.) ?4/08 0031 HL 0.40, therapeutic x 1 ?4/08 0633 HL 0.40 therapeutic x 2 ?4/09 0543 HL 0.32, therapeutic x 3 ?4/10 0606 HL 0.23, subtherapeutic ?4/10 1512 HL 0.24, subtherapeutic ? ?Per RN, line briefly pulled overnight, new IV placed.   ? ?Goal of Therapy:  ?Heparin level 0.3-0.7 units/ml ?Monitor platelets by anticoagulation protocol: Yes ?  ?Plan:  ?Heparin sub-therapeutic ?Give bolus 1400 units x 1. Increase heparin drip to 2000 units/hr ?Check heparin level 6 hours after rate change ?Monitor daily CBC, s/s of bleed ? ? ?Darnelle Bos, PharmD ?07/18/2021 ?5:40 PM ? ? ? ?

## 2021-07-18 NOTE — Plan of Care (Signed)
VSS on HFNC, transition to CPAP overnight; tolerating well. Afib. R foot pain 8/10; R foot/ankle hot/red. MD aware. Ice pack applied and PRN percocet givenx3 providing moderate relief. LLE [place] completed. Pt remains on hep gtt.  ?Problem: Education: ?Goal: Knowledge of General Education information will improve ?Description: Including pain rating scale, medication(s)/side effects and non-pharmacologic comfort measures ?Outcome: Progressing ?  ?Problem: Health Behavior/Discharge Planning: ?Goal: Ability to manage health-related needs will improve ?Outcome: Progressing ?  ?Problem: Clinical Measurements: ?Goal: Ability to maintain clinical measurements within normal limits will improve ?Outcome: Progressing ?Goal: Will remain free from infection ?Outcome: Progressing ?Goal: Diagnostic test results will improve ?Outcome: Progressing ?Goal: Respiratory complications will improve ?Outcome: Progressing ?Goal: Cardiovascular complication will be avoided ?Outcome: Progressing ?  ?Problem: Activity: ?Goal: Risk for activity intolerance will decrease ?Outcome: Progressing ?  ?Problem: Nutrition: ?Goal: Adequate nutrition will be maintained ?Outcome: Progressing ?  ?Problem: Coping: ?Goal: Level of anxiety will decrease ?Outcome: Progressing ?  ?Problem: Elimination: ?Goal: Will not experience complications related to bowel motility ?Outcome: Progressing ?Goal: Will not experience complications related to urinary retention ?Outcome: Progressing ?  ?Problem: Pain Managment: ?Goal: General experience of comfort will improve ?Outcome: Progressing ?  ?Problem: Safety: ?Goal: Ability to remain free from injury will improve ?Outcome: Progressing ?  ?Problem: Skin Integrity: ?Goal: Risk for impaired skin integrity will decrease ?Outcome: Progressing ?  ?Problem: Education: ?Goal: Ability to demonstrate management of disease process will improve ?Outcome: Progressing ?Goal: Ability to verbalize understanding of medication therapies will  improve ?Outcome: Progressing ?Goal: Individualized Educational Video(s) ?Outcome: Progressing ?  ?Problem: Activity: ?Goal: Capacity to carry out activities will improve ?Outcome: Progressing ?  ?Problem: Cardiac: ?Goal: Ability to achieve and maintain adequate cardiopulmonary perfusion will improve ?Outcome: Progressing ?  ?Problem: Education: ?Goal: Knowledge of disease or condition will improve ?Outcome: Progressing ?Goal: Knowledge of the prescribed therapeutic regimen will improve ?Outcome: Progressing ?Goal: Individualized Educational Video(s) ?Outcome: Progressing ?  ?Problem: Activity: ?Goal: Ability to tolerate increased activity will improve ?Outcome: Progressing ?Goal: Will verbalize the importance of balancing activity with adequate rest periods ?Outcome: Progressing ?  ?Problem: Respiratory: ?Goal: Ability to maintain a clear airway will improve ?Outcome: Progressing ?Goal: Levels of oxygenation will improve ?Outcome: Progressing ?Goal: Ability to maintain adequate ventilation will improve ?Outcome: Progressing ?  ?Problem: Activity: ?Goal: Ability to tolerate increased activity will improve ?Outcome: Progressing ?  ?Problem: Clinical Measurements: ?Goal: Ability to maintain a body temperature in the normal range will improve ?Outcome: Progressing ?  ?Problem: Respiratory: ?Goal: Ability to maintain adequate ventilation will improve ?Outcome: Progressing ?Goal: Ability to maintain a clear airway will improve ?Outcome: Progressing ?  ?

## 2021-07-19 LAB — CBC
HCT: 35.5 % — ABNORMAL LOW (ref 36.0–46.0)
Hemoglobin: 11 g/dL — ABNORMAL LOW (ref 12.0–15.0)
MCH: 30.3 pg (ref 26.0–34.0)
MCHC: 31 g/dL (ref 30.0–36.0)
MCV: 97.8 fL (ref 80.0–100.0)
Platelets: 153 10*3/uL (ref 150–400)
RBC: 3.63 MIL/uL — ABNORMAL LOW (ref 3.87–5.11)
RDW: 13.7 % (ref 11.5–15.5)
WBC: 7 10*3/uL (ref 4.0–10.5)
nRBC: 0 % (ref 0.0–0.2)

## 2021-07-19 LAB — LEGIONELLA PNEUMOPHILA SEROGP 1 UR AG: L. pneumophila Serogp 1 Ur Ag: NEGATIVE

## 2021-07-19 LAB — BASIC METABOLIC PANEL
Anion gap: 10 (ref 5–15)
BUN: 23 mg/dL — ABNORMAL HIGH (ref 6–20)
CO2: 33 mmol/L — ABNORMAL HIGH (ref 22–32)
Calcium: 8.7 mg/dL — ABNORMAL LOW (ref 8.9–10.3)
Chloride: 96 mmol/L — ABNORMAL LOW (ref 98–111)
Creatinine, Ser: 0.98 mg/dL (ref 0.44–1.00)
GFR, Estimated: >60 mL/min (ref >60)
Glucose, Bld: 123 mg/dL — ABNORMAL HIGH (ref 70–99)
Potassium: 3.6 mmol/L (ref 3.5–5.1)
Sodium: 139 mmol/L (ref 135–145)

## 2021-07-19 LAB — HEPARIN LEVEL (UNFRACTIONATED)
Heparin Unfractionated: 0.33 IU/mL (ref 0.30–0.70)
Heparin Unfractionated: 0.37 IU/mL (ref 0.30–0.70)

## 2021-07-19 LAB — MAGNESIUM: Magnesium: 2.5 mg/dL — ABNORMAL HIGH (ref 1.7–2.4)

## 2021-07-19 MED ORDER — OXYCODONE-ACETAMINOPHEN 5-325 MG PO TABS
1.0000 | ORAL_TABLET | ORAL | Status: DC | PRN
  Administered 2021-07-19 (×2): 1 via ORAL
  Administered 2021-07-20 (×4): 2 via ORAL
  Administered 2021-07-20: 1 via ORAL
  Administered 2021-07-21 – 2021-07-22 (×6): 2 via ORAL
  Filled 2021-07-19 (×2): qty 2
  Filled 2021-07-19: qty 1
  Filled 2021-07-19 (×4): qty 2
  Filled 2021-07-19: qty 1
  Filled 2021-07-19 (×5): qty 2

## 2021-07-19 MED ORDER — IPRATROPIUM-ALBUTEROL 0.5-2.5 (3) MG/3ML IN SOLN
3.0000 mL | Freq: Four times a day (QID) | RESPIRATORY_TRACT | Status: DC
Start: 2021-07-19 — End: 2021-07-21
  Administered 2021-07-19 – 2021-07-21 (×8): 3 mL via RESPIRATORY_TRACT
  Filled 2021-07-19 (×8): qty 3

## 2021-07-19 NOTE — Progress Notes (Signed)
ANTICOAGULATION CONSULT NOTE  ? ?Pharmacy Consult for Heparin drip ?Indication: pulmonary embolus ? ?Allergies  ?Allergen Reactions  ? Amiodarone   ?  Caused toxicity   ? ? ?Patient Measurements: ?Height: 5\' 2"  (157.5 cm) ?Weight: (!) 156.5 kg (345 lb) (per patient) ?IBW/kg (Calculated) : 50.1 ?Heparin Dosing Weight: 93 kg ? ?Vital Signs: ?Temp: 100 ?F (37.8 ?C) (04/10 2000) ?Temp Source: Oral (04/10 2000) ?BP: 138/76 (04/10 2200) ?Pulse Rate: 100 (04/10 2200) ? ?Labs: ?Recent Labs  ?  07/16/21 ?0633 07/17/21 ?0543 07/18/21 ?0606 07/18/21 ?1512 07/19/21 ?0000  ?HGB 11.5* 11.2* 10.9*  --   --   ?HCT 37.8 36.9 35.6*  --   --   ?PLT 152 144* 164  --   --   ?HEPARINUNFRC 0.40 0.32 0.23* 0.24* 0.33  ?CREATININE 1.13* 1.04* 1.04*  --   --   ? ? ? ?Estimated Creatinine Clearance: 84.2 mL/min (A) (by C-G formula based on SCr of 1.04 mg/dL (H)). ? ? ?Medical History: ?Past Medical History:  ?Diagnosis Date  ? Abnormal mammogram, unspecified 2013  ? Atrial fibrillation (HCC)   ? 2013  ? Breast screening, unspecified   ? CHF (congestive heart failure) (HCC)   ? COPD (chronic obstructive pulmonary disease) (HCC)   ? History of continuous positive airway pressure (CPAP) therapy at home   ? Hypothyroidism   ? Obesity, unspecified   ? Pneumonia   ? Screening for obesity   ? Sleep apnea   ? Special screening for malignant neoplasms, colon   ? Unspecified essential hypertension   ? ? ?Medications:  ?Medications Prior to Admission  ?Medication Sig Dispense Refill Last Dose  ? diltiazem (DILACOR XR) 240 MG 24 hr capsule Take 240 mg by mouth daily.   07/14/2021  ? fluticasone-salmeterol (ADVAIR) 250-50 MCG/ACT AEPB Inhale 1 puff into the lungs in the morning and at bedtime.   07/15/2021  ? furosemide (LASIX) 40 MG tablet Take 2 tablets (80 mg total) by mouth daily. 180 tablet 3 07/14/2021  ? potassium chloride SA (K-DUR,KLOR-CON) 20 MEQ tablet Take 1 tablet (20 mEq total) by mouth daily. (Patient taking differently: Take 20 mEq by mouth at  bedtime.) 30 tablet 6 07/14/2021  ? albuterol (VENTOLIN HFA) 108 (90 Base) MCG/ACT inhaler INHALE 2 PUFFS INTO THE LUNGS EVERY 6 (SIX) HOURS AS NEEDED FOR WHEEZING. 6.7 each 5 prn at unknown  ? amLODipine (NORVASC) 5 MG tablet amlodipine 5 mg tablet (Patient not taking: Reported on 01/17/2021)     ? cyanocobalamin 2000 MCG tablet Take 2,000 mcg by mouth daily.  (Patient not taking: Reported on 07/15/2021)   Not Taking  ? doxycycline (VIBRA-TABS) 100 MG tablet Take 1 tablet (100 mg total) by mouth 2 (two) times daily. For 10 days. Take with full glass of water, stay upright 30 min after taking. (Patient not taking: Reported on 07/15/2021) 20 tablet 0 Not Taking  ? Fluticasone-Umeclidin-Vilant (TRELEGY ELLIPTA) 100-62.5-25 MCG/INH AEPB Inhale 1 Inhaler into the lungs daily. (Patient not taking: Reported on 07/15/2021) 180 each 4 Not Taking  ? levothyroxine (SYNTHROID) 75 MCG tablet TAKE 1 TABLET BY MOUTH DAILY BEFORE BREAKFAST. WILL NEED THYROID LAB & FOLLOW-UP FOR FURTHER REFILLS (Patient not taking: Reported on 07/15/2021) 90 tablet 0 Not Taking  ? metoprolol succinate (TOPROL-XL) 25 MG 24 hr tablet Take 3 tablets (75 mg total) by mouth daily. (Patient not taking: Reported on 07/15/2021) 90 tablet 1 Not Taking  ? OXYGEN Inhale 2 L/L into the lungs as needed.      ?  predniSONE (DELTASONE) 10 MG tablet Take 6 tabs on day 1, 5 tabs on day 2, 4 tabs on day 3, 3 tabs on day 4, 2 tabs on day 5, 1 tab on day 6 (Patient not taking: Reported on 07/15/2021) 21 tablet 0 Not Taking  ? rivaroxaban (XARELTO) 20 MG TABS tablet Xarelto 20 mg tablet (Patient not taking: Reported on 07/15/2021)   Not Taking  ? Spacer/Aero-Holding Chambers DEVI 1 Device by Does not apply route as needed. 1 each 0   ? ?Scheduled:  ? Chlorhexidine Gluconate Cloth  6 each Topical Q0600  ? diltiazem  240 mg Oral Daily  ? furosemide  40 mg Intravenous Q12H  ? ipratropium-albuterol  3 mL Nebulization Q4H  ? mometasone-formoterol  2 puff Inhalation BID  ? ?Infusions:  ?  heparin 2,000 Units/hr (07/18/21 2000)  ? ? ?Assessment: ?61 year old female presenting with shortness of breath. PMH includes significant of hypertension, COPD on 1 L oxygen, OSA on CPAP, hypothyroidism, morbid obesity with a BMI 66.49, sCHF with EF 45-50%, atrial fibrillation (stopped taking Eliquis more than 2 months ago d/t cost). Pharmacy has been consulted to dose heparin for massive pulmonary embolism. ? ?Hgb 12.6  plt 174  INR 1.1  aPTT 29 ? ?0407 @1731  HL= 0.27  (pt had thrombectomy/tPA, heparin drip stopped and restarted at 1430, so this level is only 3 hrs after restart.) ?4/08 0031 HL 0.40, therapeutic x 1 ?4/08 0633 HL 0.40 therapeutic x 2 ?4/09 0543 HL 0.32, therapeutic x 3 ?4/10 0606 HL 0.23, subtherapeutic ?4/10 1512 HL 0.24, subtherapeutic ?4/11 0000 HL 0.33, therapeutic X 1  ? ?Per RN, line briefly pulled overnight, new IV placed.   ? ?Goal of Therapy:  ?Heparin level 0.3-0.7 units/ml ?Monitor platelets by anticoagulation protocol: Yes ?  ?Plan:  ?4/11:  HL @ 0000 = 0.33, therapeutic X 1 ?Will continue pt on current rate and recheck HL on 4/11 @ 0600.  ? ?Wayman Hoard D, PharmD ?07/19/2021 ?12:55 AM ? ? ? ?

## 2021-07-19 NOTE — Progress Notes (Signed)
Pain medication increased.  Given patient 2nd pill of percocet for toe/foot pain.  ?

## 2021-07-19 NOTE — Progress Notes (Signed)
ANTICOAGULATION CONSULT NOTE  ? ?Pharmacy Consult for Heparin drip ?Indication: pulmonary embolus ? ?Patient Measurements: ?Height: 5\' 2"  (157.5 cm) ?Weight: (!) 156.5 kg (345 lb) (per patient) ?IBW/kg (Calculated) : 50.1 ?Heparin Dosing Weight: 93 kg ? ?Labs: ?Recent Labs  ?  07/17/21 ?0543 07/18/21 ?0606 07/18/21 ?1512 07/19/21 ?0000 07/19/21 ?0540  ?HGB 11.2* 10.9*  --   --  11.0*  ?HCT 36.9 35.6*  --   --  35.5*  ?PLT 144* 164  --   --  153  ?HEPARINUNFRC 0.32 0.23* 0.24* 0.33 0.37  ?CREATININE 1.04* 1.04*  --   --  0.98  ? ? ? ?Estimated Creatinine Clearance: 89.3 mL/min (by C-G formula based on SCr of 0.98 mg/dL). ? ? ?Medical History: ?Past Medical History:  ?Diagnosis Date  ? Abnormal mammogram, unspecified 2013  ? Atrial fibrillation (HCC)   ? 2013  ? Breast screening, unspecified   ? CHF (congestive heart failure) (HCC)   ? COPD (chronic obstructive pulmonary disease) (HCC)   ? History of continuous positive airway pressure (CPAP) therapy at home   ? Hypothyroidism   ? Obesity, unspecified   ? Pneumonia   ? Screening for obesity   ? Sleep apnea   ? Special screening for malignant neoplasms, colon   ? Unspecified essential hypertension   ? ?Assessment: ?61 year old female presenting with shortness of breath. PMH includes significant of hypertension, COPD on 1 L oxygen, OSA on CPAP, hypothyroidism, morbid obesity with a BMI 66.49, sCHF with EF 45-50%, atrial fibrillation (stopped taking Eliquis more than 2 months ago d/t cost). Pharmacy has been consulted to dose heparin for massive pulmonary embolism. ? ?Hgb 12.6  plt 174  INR 1.1  aPTT 29 ? ?0407 @1731  HL= 0.27  (pt had thrombectomy/tPA, heparin drip stopped and restarted at 1430, so this level is only 3 hrs after restart.) ?4/08 0031 HL 0.40, therapeutic x 1 ?4/08 0633 HL 0.40 therapeutic x 2 ?4/09 0543 HL 0.32, therapeutic x 3 ?4/10 0606 HL 0.23, subtherapeutic ?4/10 1512 HL 0.24, subtherapeutic ?4/11 0000 HL 0.33, therapeutic x 1  ?4/11 0540 HL 0.37,  therapeutic x 2 ? ?Goal of Therapy:  ?Heparin level 0.3-0.7 units/ml ?Monitor platelets by anticoagulation protocol: Yes ?  ?Plan:  ?--Heparin level is therapeutic x 2 ?--Continue heparin infusion at 2000 units/hr ?--Next HL / CBC tomorrow AM ?--Plan is for transition to apixaban on discharge ? ?6/11 ?07/19/2021 ?11:46 AM ? ? ? ?

## 2021-07-19 NOTE — Progress Notes (Signed)
?Progress Note ? ? ?Patient: Katherine Scott JGG:[id number] DOB: Nov 10, 1960 DOA: 07/15/2021     4 ?DOS: the patient was seen and examined on 07/19/2021 ?  ?Brief hospital course: ? Katherine Scott is a 61 y.o. female with medical history significant of hypertension, COPD on 1 L oxygen, OSA on CPAP, hypothyroidism, morbid obesity with a BMI 66.49, sCHF with EF 45-50%, atrial fibrillation (stopped taking Eliquis more than 2 months ago), who presents with shortness of breath. ?CT angiogram of the chest showed a submassive PE with evidence of acute right heart strain.  Patient was given IV heparin.  Pulmonary thrombectomy was performed on 4/7. ? ?Assessment and Plan: ?* Pulmonary embolism with acute cor pulmonale (HCC) ?Patient CT scan had a submassive bilateral PE, associated with cor pulmonale.  Patient is status post pulmonary thrombectomy.  Due to large load of PE, patient is requiring high flow oxygen.  She also have evidence of right-sided congestive heart failure. ?Due to severity of disease, I will continue heparin drip, will change to Eliquis when condition is more stable. ?Case management is looking into the cost of Eliquis. ?Duplex ultrasound did not show bilateral lower extremity DVT.  Patient had a some left leg redness yesterday, which seem to be better today.  Repeated duplex ultrasound still did not see a DVT. ? ?OSA (obstructive sleep apnea) ?Patient has been diagnosed with obstructive sleep apnea.  She was on CPAP for several years until few months ago, she took off CPAP for a few months before admission. ?Patient currently will be on high flow oxygen while BiPAP at nighttime.   ?Discussed with RT and the case management, they will try to set up home CPAP at time of discharge. ? ?HTN (hypertension) ?Blood pressure stable. ? ?AKI (acute kidney injury) (HCC) ?Reviewed prior lab, also looking to [facility] system record, most recent outpatient lab was performed in 02/2020, at that time renal function was normal.  GFR has  improved. ? ?Elevated troponin ?Due to PE.  No additional work-up is needed. ? ?Chronic systolic CHF (congestive heart failure) (HCC) ?She has volume overload due to acute cor pulmonale.   ?Patient still has high flow oxygen, continue another day of IV Lasix. ? ?COPD (chronic obstructive pulmonary disease) (HCC) ?Continue bronchodilators ? ?Chronic atrial fibrillation with RVR (HCC) ?Continue heparin, changed to Eliquis at the time of discharge. ? ?Acute on chronic respiratory failure with hypoxia (HCC) ?Secondary to acute pulmonary emboli, chronic systolic congestive heart failure and significant sleep apnea. ?Currently on 40% heated high flow.  Continue to wean oxygen. ? ?BMI 60.0-69.9, adult (HCC) ?Diet and exercise when condition is more stable ? ?Acquired hypothyroidism ?Synthroid. ? ? ? ? ?  ? ?Subjective:  ?Patient has some pain in the left lower extremity, redness, much improved today. ?Short of breath with exertion, still on high flow oxygen.  She has a cough, white mucus, no additional hemoptysis ? ?Physical Exam: ?Vitals:  ? 07/19/21 0900 07/19/21 1000 07/19/21 1004 07/19/21 1155  ?BP:  (!) 145/80 (!) 145/80   ?Pulse: (!) 110 95    ?Resp:      ?Temp:    98.6 ?F (37 ?C)  ?TempSrc:    Oral  ?SpO2: 94% 92%    ?Weight:      ?Height:      ? ?General exam: Appears calm,  morbid obese. ?Respiratory system: Decreased breathing sounds. Respiratory effort normal. ?Cardiovascular system: S1 & S2 heard, RRR. No JVD, murmurs, rubs, gallops or clicks. No pedal edema. ?  Gastrointestinal system: Abdomen is nondistended, soft and nontender. No organomegaly or masses felt. Normal bowel sounds heard. ?Central nervous system: Alert and oriented. No focal neurological deficits. ?Extremities: Symmetric 5 x 5 power. ?Skin: No rashes, lesions or ulcers ?Psychiatry: Judgement and insight appear normal. Mood & affect appropriate.  ? ?Data Reviewed: ? ?Reviewed the duplex ultrasound.  All lab results. ? ?Family Communication:  None ? ?Disposition: ?Status is: Inpatient ?Remains inpatient appropriate because: Severity of disease, high flow oxygen.  IV anticoagulation ? Planned Discharge Destination: Home ? ? ? ?Time spent: 28 minutes ? ?Author: ?Marrion Coy, MD ?07/19/2021 12:23 PM ? ?For on call review [email or web address].  ?

## 2021-07-20 LAB — CULTURE, BLOOD (ROUTINE X 2)
Culture: NO GROWTH
Culture: NO GROWTH
Special Requests: ADEQUATE

## 2021-07-20 LAB — CBC
HCT: 35.8 % — ABNORMAL LOW (ref 36.0–46.0)
Hemoglobin: 10.9 g/dL — ABNORMAL LOW (ref 12.0–15.0)
MCH: 29.3 pg (ref 26.0–34.0)
MCHC: 30.4 g/dL (ref 30.0–36.0)
MCV: 96.2 fL (ref 80.0–100.0)
Platelets: 160 10*3/uL (ref 150–400)
RBC: 3.72 MIL/uL — ABNORMAL LOW (ref 3.87–5.11)
RDW: 13.4 % (ref 11.5–15.5)
WBC: 9 10*3/uL (ref 4.0–10.5)
nRBC: 0 % (ref 0.0–0.2)

## 2021-07-20 LAB — HEPARIN LEVEL (UNFRACTIONATED): Heparin Unfractionated: 0.19 IU/mL — ABNORMAL LOW (ref 0.30–0.70)

## 2021-07-20 LAB — STREP PNEUMONIAE URINARY ANTIGEN: Strep Pneumo Urinary Antigen: NEGATIVE

## 2021-07-20 MED ORDER — GABAPENTIN 100 MG PO CAPS
100.0000 mg | ORAL_CAPSULE | Freq: Three times a day (TID) | ORAL | Status: DC
  Administered 2021-07-20 – 2021-07-22 (×7): 100 mg via ORAL
  Filled 2021-07-20 (×7): qty 1

## 2021-07-20 MED ORDER — HEPARIN BOLUS VIA INFUSION
2800.0000 [IU] | Freq: Once | INTRAVENOUS | Status: AC
Start: 2021-07-20 — End: 2021-07-20
  Administered 2021-07-20: 2800 [IU] via INTRAVENOUS
  Filled 2021-07-20: qty 2800

## 2021-07-20 MED ORDER — APIXABAN 5 MG PO TABS
5.0000 mg | ORAL_TABLET | Freq: Two times a day (BID) | ORAL | Status: DC
Start: 2021-07-27 — End: 2021-07-22

## 2021-07-20 MED ORDER — APIXABAN 5 MG PO TABS
10.0000 mg | ORAL_TABLET | Freq: Two times a day (BID) | ORAL | Status: DC
  Administered 2021-07-20 – 2021-07-22 (×5): 10 mg via ORAL
  Filled 2021-07-20 (×5): qty 2

## 2021-07-20 NOTE — TOC Initial Note (Addendum)
Transition of Care (TOC) - Initial/Assessment Note  ? ? ?Patient Details  ?Name: Katherine Scott ?MRN: [id number] ?Date of Birth: 14-Mar-1961 ? ?Transition of Care (TOC) CM/SW Contact:    ?Gildardo Griffes, LCSW ?Phone Number: ?07/20/2021, 2:44 PM ? ?Clinical Narrative:                 ? ?CSW notes patient is uninsured, lives with daughter at home, is independent and drives. She is current with Dr. Althea Charon in [place].  She has not been taking her medications like she should because she cannot afford them.  Patient does not currently have any insurance, she applied for disability in Aug. 2021, she has a Regulatory affairs officer, ? ?PT recs are SNF if patient's breathing and neuropathy improves.  ? ?Patient reports being in agreement to SNF if hospital able to provide LOG for payment due to patient's lack of insurance.  ? ?Patient may potentially be candidate for CIR, if not then LOG eligibility will be assessed.  ? ?Ultimate goal at this point is for patient to improve with PT during hospital stay to go home with home health at discharge.  ? ?Patient wears oxygen at home 2L [place] from [facility]- she reports initially just at night but before coming to the hospital was having to wear it all the time. Patient is Self Pay but has a large balance with them so they cannot offer any additional equipment.  ? ? Patient would benefit from Bipap at discharge, she is currently requring HFNC 40L 40%.  Referral given to [facility] with [employer] for Bipap- he requires a 3 month LOG.  ? ? RNCM will reach out to department leadership to see if 3 month LOG can be offered.  ?  ?Pending dispo for bipap follow up at this time.  ? ?[facility] [employer] saw patient at the bedside today and they will apply for Medicaid for patient as soon as she discharges.   ?  ?TOC will cont to follow for dispo and bipap.  ? ?Expected Discharge Plan:  (TBD) ?Barriers to Discharge: Continued Medical Work up ? ? ?Patient Goals and CMS Choice ?Patient states their goals for this  hospitalization and ongoing recovery are:: to go home ?CMS Medicare.gov Compare Post Acute Care list provided to:: Patient ?Choice offered to / list presented to : Patient ? ?Expected Discharge Plan and Services ?Expected Discharge Plan:  (TBD) ?  ?Discharge Planning Services: CM Consult ?  ?Living arrangements for the past 2 months: Single Family Home ?                ?DME Arranged: Bipap ?DME Agency: [facility] ?Date DME Agency Contacted: 07/18/21 ?Time DME Agency Contacted: 1445 ?Representative spoke with at DME Agency: Oletha Cruel ?HH Arranged: NA ?HH Agency: NA ?  ?  ?  ? ?Prior Living Arrangements/Services ?Living arrangements for the past 2 months: Single Family Home ?Lives with:: Adult Children ?Patient language and need for interpreter reviewed:: Yes ?Do you feel safe going back to the place where you live?: Yes      ?Need for Family Participation in Patient Care: Yes (Comment) ?Care giver support system in place?: Yes (comment) (daughter, sister) ?Current home services: DME (oxygen) ?Criminal Activity/Legal Involvement Pertinent to Current Situation/Hospitalization: No - Comment as needed ? ?Activities of Daily Living ?Home Assistive Devices/Equipment: Oxygen, Cane (specify quad or straight), CPAP, Eyeglasses, Walker (specify type) ?ADL Screening (condition at time of admission) ?Patient's cognitive ability adequate to safely complete daily activities?: Yes ?Is the patient  deaf or have difficulty hearing?: No ?Does the patient have difficulty seeing, even when wearing glasses/contacts?: No ?Does the patient have difficulty concentrating, remembering, or making decisions?: No ?Patient able to express need for assistance with ADLs?: Yes ?Does the patient have difficulty dressing or bathing?: No ?Independently performs ADLs?: Yes (appropriate for developmental age) ?Does the patient have difficulty walking or climbing stairs?: No ?Weakness of Legs: None ?Weakness of Arms/Hands: None ? ?Permission  Sought/Granted ?Permission sought to share information with : Case Manager, Family Supports ?Permission granted to share information with : Yes, Verbal Permission Granted ? Share Information with NAME: Amy Beverely Pace ? Permission granted to share info w AGENCY: [facility] ? Permission granted to share info w Relationship: sister ? Permission granted to share info w Contact Information: [phone] ? ?Emotional Assessment ?Appearance:: Appears older than stated age ?Attitude/Demeanor/Rapport: Engaged ?Affect (typically observed): Accepting ?Orientation: : Oriented to Self, Oriented to Place, Oriented to  Time, Oriented to Situation ?Alcohol / Substance Use: Not Applicable ?Psych Involvement: No (comment) ? ?Admission diagnosis:  Positive D dimer [R79.89] ?Bilateral pulmonary embolism (HCC) [I26.99] ?Elevated brain natriuretic peptide (BNP) level [R79.89] ?Troponin I above reference range [R77.8] ?Atrial fibrillation with RVR (HCC) [I48.91] ?Acute on chronic respiratory failure with hypoxia (HCC) [J96.21] ?Pneumonia of both lungs due to infectious organism, unspecified part of lung [J18.9] ?Acute pulmonary embolism with acute cor pulmonale, unspecified pulmonary embolism type (HCC) [I26.09] ?Patient Active Problem List  ? Diagnosis Date Noted  ? OSA (obstructive sleep apnea) 07/16/2021  ? Pulmonary embolism with acute cor pulmonale (HCC) 07/15/2021  ? Chronic atrial fibrillation with RVR (HCC) 07/15/2021  ? COPD (chronic obstructive pulmonary disease) (HCC) 07/15/2021  ? Chronic systolic CHF (congestive heart failure) (HCC) 07/15/2021  ? Elevated troponin 07/15/2021  ? AKI (acute kidney injury) (HCC) 07/15/2021  ? HTN (hypertension) 07/15/2021  ? Atrial fibrillation with RVR (HCC)   ? Congestive heart failure (HCC) 08/02/2020  ? Closed fracture of proximal end of left humerus 04/15/2020  ? Shortness of breath 02/12/2020  ? COPD with acute exacerbation (HCC) 02/12/2020  ? Pulmonary edema 02/12/2020  ? Venous ulcer (HCC)  10/09/2019  ? Acute on chronic respiratory failure with hypoxia (HCC) 03/20/2019  ? Lymphedema 02/26/2017  ? 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 Acquired hypothyroidism 08/11/2016  ? BMI 60.0-69.9, adult (HCC) 08/11/2016  ? Persistent atrial fibrillation (HCC) 06/17/2014  ? ?PCP:  Smitty Cords, DO ?Pharmacy:   ?[facility] ?[address] ?[place] [address] ?Phone: [phone] Fax: [phone] ? ? ? ? ?Social Determinants of Health (SDOH) Interventions ?  ? ?Readmission Risk Interventions ? ?  07/18/2021  ?  2:12 PM 02/16/2020  ?  1:56 PM 02/08/2019  ? 12:55 PM  ?Readmission Risk Prevention Plan  ?Post Dischage Appt   Complete  ?Medication Screening   Complete  ?Transportation Screening Complete Complete Complete  ?PCP or Specialist Appt within 5-7 Days Complete Not Complete   ?Home Care Screening Complete Not Complete   ?Medication Review (RN CM) Complete Complete   ? ? ? ?

## 2021-07-20 NOTE — Progress Notes (Signed)
ANTICOAGULATION CONSULT NOTE  ? ?Pharmacy Consult for Heparin drip ?Indication: pulmonary embolus ? ?Patient Measurements: ?Height: 5\' 2"  (157.5 cm) ?Weight: (!) 156.5 kg (345 lb) (per patient) ?IBW/kg (Calculated) : 50.1 ?Heparin Dosing Weight: 93 kg ? ?Labs: ?Recent Labs  ?  07/18/21 ?0606 07/18/21 ?1512 07/19/21 ?0000 07/19/21 ?09/18/21 07/20/21 ?09/19/21  ?HGB 10.9*  --   --  11.0* 10.9*  ?HCT 35.6*  --   --  35.5* 35.8*  ?PLT 164  --   --  153 160  ?HEPARINUNFRC 0.23*   < > 0.33 0.37 0.19*  ?CREATININE 1.04*  --   --  0.98  --   ? < > = values in this interval not displayed.  ? ? ? ?Estimated Creatinine Clearance: 89.3 mL/min (by C-G formula based on SCr of 0.98 mg/dL). ? ? ?Medical History: ?Past Medical History:  ?Diagnosis Date  ? Abnormal mammogram, unspecified 2013  ? Atrial fibrillation (HCC)   ? 2013  ? Breast screening, unspecified   ? CHF (congestive heart failure) (HCC)   ? COPD (chronic obstructive pulmonary disease) (HCC)   ? History of continuous positive airway pressure (CPAP) therapy at home   ? Hypothyroidism   ? Obesity, unspecified   ? Pneumonia   ? Screening for obesity   ? Sleep apnea   ? Special screening for malignant neoplasms, colon   ? Unspecified essential hypertension   ? ?Assessment: ?61 year old female presenting with shortness of breath. PMH includes significant of hypertension, COPD on 1 L oxygen, OSA on CPAP, hypothyroidism, morbid obesity with a BMI 66.49, sCHF with EF 45-50%, atrial fibrillation (stopped taking Eliquis more than 2 months ago d/t cost). Pharmacy has been consulted to dose heparin for massive pulmonary embolism. ? ?Hgb 12.6  plt 174  INR 1.1  aPTT 29 ? ?0407 @1731  HL= 0.27  (pt had thrombectomy/tPA, heparin drip stopped and restarted at 1430, so this level is only 3 hrs after restart.) ?4/08 0031 HL 0.40, therapeutic x 1 ?4/08 0633 HL 0.40 therapeutic x 2 ?4/09 0543 HL 0.32, therapeutic x 3 ?4/10 0606 HL 0.23, subtherapeutic ?4/10 1512 HL 0.24, subtherapeutic ?4/11 0000  HL 0.33, therapeutic x 1  ?4/11 0540 HL 0.37, therapeutic x 2 ?4/12 0508 HL 0.19, SUBtherapeutic  ? ?Goal of Therapy:  ?Heparin level 0.3-0.7 units/ml ?Monitor platelets by anticoagulation protocol: Yes ?  ?Plan:  ?04/12:  HL @ 0508 = 0.19, subtherapeutic  ?Will order heparin 2800 units IV X 1 bolus and increase drip rate to 2300 units/hr.  ?Will recheck HL 6 hrs after rate change.  ? ?-Marte Celani D ?07/20/2021 ?6:57 AM ? ? ? ?

## 2021-07-20 NOTE — Progress Notes (Addendum)
Patient c/o pain in left lower leg, foot, and toes.  Area is red/hot and sensitive to anything touching it.  Pain meds provided per order and perimeter of redness marked.  ?

## 2021-07-20 NOTE — Progress Notes (Signed)
?Progress Note ? ? ?Patient: Katherine Scott DOB: 1960-08-20 DOA: 07/15/2021     5 ?DOS: the patient was seen and examined on 07/20/2021 ?  ?Brief hospital course: ? Katherine Scott is a 61 y.o. female with medical history significant of hypertension, COPD on 1 L oxygen, OSA on CPAP, hypothyroidism, morbid obesity with a BMI 66.49, sCHF with EF 45-50%, atrial fibrillation (stopped taking Eliquis more than 2 months ago), who presents with shortness of breath. ?CT angiogram of the chest showed a submassive PE with evidence of acute right heart strain.  Patient was given IV heparin.  Pulmonary thrombectomy was performed on 4/7. ? ?4/12: Transition to Eliquis.  PT recommends SNF.  Started gabapentin for lower extremity neuropathy ? ? ?Assessment and Plan: ?* Pulmonary embolism with acute cor pulmonale (HCC) ?Patient CT scan had a submassive bilateral PE, associated with cor pulmonale.  Patient is status post pulmonary thrombectomy.  Due to large load of PE, patient is requiring high flow oxygen.  She also have evidence of right-sided congestive heart failure. ?Transition to Eliquis today ?Case management/pharmacist assisting due to cost of Eliquis and no insurance. ?Duplex ultrasound shows no lower extremity DVT ? ?OSA (obstructive sleep apnea) ?Patient has been diagnosed with obstructive sleep apnea.  She was on CPAP for several years until few months ago, she took off CPAP for a few months before admission. ?Patient currently will be on high flow oxygen while BiPAP at nighttime.   ?Discussed with RT and the case management, they will try to set up home CPAP at time of discharge.  May be difficult as she does not have insurance ? ?HTN (hypertension) ?Blood pressure stable. ? ?AKI (acute kidney injury) (HCC) ?Reviewed prior lab, also looking to [facility] system record, most recent outpatient lab was performed in 02/2020, at that time renal function was normal.  GFR has improved.  AKI resolved ? ?Elevated troponin ?Due  to PE.  No additional work-up is needed. ? ?Chronic systolic CHF (congestive heart failure) (HCC) ?She has volume overload due to acute cor pulmonale.   ?Patient still has high flow oxygen, continue IV Lasix. ? ?COPD (chronic obstructive pulmonary disease) (HCC) ?Continue bronchodilators ? ?Chronic atrial fibrillation with RVR (HCC) ?Transition to oral Eliquis today ? ?Acute on chronic respiratory failure with hypoxia (HCC) ?Secondary to acute pulmonary emboli, chronic systolic congestive heart failure and significant sleep apnea. ?Currently on 40% heated high flow.  Continue to wean oxygen. ? ?BMI 60.0-69.9, adult (HCC) ?Morbid obesity ?Complicates prognosis.  She does not have insurance which limits her care ? ?Acquired hypothyroidism ?Continue Synthroid. ? ? ? ? ?  ? ?Subjective: Complains of lower extremity pain -remains on HFNC ? ?Physical Exam: ?Vitals:  ? 07/20/21 0212 07/20/21 0718 07/20/21 0823 07/20/21 1341  ?BP: 136/82  (!) 142/74   ?Pulse: (!) 110  (!) 110   ?Resp: 15  14   ?Temp: (!) 97.5 ?F (36.4 ?C)  98.1 ?F (36.7 ?C)   ?TempSrc:   Oral   ?SpO2: 97% 94% 94% 96%  ?Weight:      ?Height:      ? ?General exam: Appears calm,  morbid obese. ?Respiratory system: Decreased breathing sounds. Respiratory effort normal. ?Cardiovascular system: S1 & S2 heard, RRR. No JVD, murmurs, rubs, gallops or clicks. No pedal edema. ?Gastrointestinal system: Abdomen is nondistended, soft and nontender. No organomegaly or masses felt. Normal bowel sounds heard. ?Central nervous system: Alert and oriented. No focal neurological deficits. ?Extremities: Symmetric 5 x 5 power. ?  Skin: No rashes, lesions or ulcers ?Psychiatry: Judgement and insight appear normal. Mood & affect appropriate.  ? ?Data Reviewed: ? ?There are no new results to review at this time. ? ?Family Communication: None at bedside ? ?Disposition: ?Status is: Inpatient ?Remains inpatient appropriate because: Getting evaluated for SNF. ? ? Planned Discharge  Destination: Home with Home Health versus SNF ? ? ? DVT prophylaxis-Eliquis ?Time spent: 35 minutes ? ?Author: ?Delfino Lovett, MD ?07/20/2021 2:41 PM ? ?For on call review [email or web address].  ?

## 2021-07-20 NOTE — Evaluation (Signed)
Occupational Therapy Evaluation ?Patient Details ?Name: Katherine Scott ?MRN: [id number] ?DOB: [birth year] ?Today's Date: 07/20/2021 ? ? ?History of Present Illness 61 y.o. female with medical history significant of hypertension, COPD on 1 L oxygen, OSA on CPAP, hypothyroidism, morbid obesity with a BMI 66.49, sCHF with EF 45-50%, atrial fibrillation (stopped taking Eliquis more than 2 months ago), who presents with shortness of breath.  CT angiogram of the chest showed a submassive PE with evidence of acute right heart strain.  Patient was given IV heparin.  Pulmonary thrombectomy was performed on 4/7.  ? ?Clinical Impression ?  ?Patient presenting with decreased Ind in self care, balance, functional mobility/transfers, endurance, and safety awareness. Patient reports living at home with daughter. She does endorse short distance ambulation and is able to drive. Her daughter is home 24/7 to assist as needed and they share IADL tasks.  Pt currently performs bed mobility with no physical assistance. Pt is limited with ambulation and transfers secondary to B LEs pain. Pt was unable to get gabapentin before this session. Pt partially stands to clear buttocks with min A but does not upright stand or transfer secondary to B foot pain. Pt is also on HFNC at this time.Patient will benefit from acute OT to increase overall independence in the areas of ADLs, functional mobility, and safety awareness in order to safely discharge home with daughter.  ?   ? ?Recommendations for follow up therapy are one component of a multi-disciplinary discharge planning process, led by the attending physician.  Recommendations may be updated based on patient status, additional functional criteria and insurance authorization.  ? ?Follow Up Recommendations ? Home health OT  ?  ?Assistance Recommended at Discharge Intermittent Supervision/Assistance  ?Patient can return home with the following A little help with walking and/or transfers;A little help  with bathing/dressing/bathroom;Assistance with cooking/housework;Assist for transportation ? ?  ?Functional Status Assessment ? Patient has had a recent decline in their functional status and demonstrates the ability to make significant improvements in function in a reasonable and predictable amount of time.  ?Equipment Recommendations ? BSC/3in1  ?  ?   ?Precautions / Restrictions Precautions ?Precautions: None ?Restrictions ?Weight Bearing Restrictions: No  ? ?  ? ?Mobility Bed Mobility ?Overal bed mobility: Modified Independent ?  ?  ?  ?Supine to sit: HOB elevated ?  ?  ?General bed mobility comments: no physical assistance ?  ? ?Transfers ?Overall transfer level: Needs assistance ?  ?Transfers: Sit to/from Stand ?Sit to Stand: Min assist ?  ?  ?  ?  ?  ?General transfer comment: Pt partially stands and clears buttocks but does not fully stand secondary to increased B foot pain. ?  ? ?  ?Balance Overall balance assessment: Needs assistance ?Sitting-balance support: Single extremity supported ?Sitting balance-Leahy Scale: Good ?  ?  ?  ?  ?  ?  ?  ?  ?  ?  ?  ?  ?  ?  ?  ?  ?   ? ?ADL either performed or assessed with clinical judgement  ? ?ADL Overall ADL's : Needs assistance/impaired ?  ?  ?  ?  ?  ?  ?  ?  ?  ?  ?  ?  ?  ?  ?  ?  ?  ?  ?  ?General ADL Comments: grooming and UB self care tasks with set up A. OT anticipates min guard -  min A for LB self care.  ? ? ? ?Vision Patient  Visual Report: No change from baseline ?   ?   ?   ?   ? ?Pertinent Vitals/Pain Pain Assessment ?Pain Assessment: No/denies pain ?Pain Score: 9  ?Pain Location: b/l feet, new onset neuropathy?  ? ? ? ?Hand Dominance Right ?  ?Extremity/Trunk Assessment Upper Extremity Assessment ?Upper Extremity Assessment: Overall WFL for tasks assessed ?  ?Lower Extremity Assessment ?Lower Extremity Assessment: Overall WFL for tasks assessed ?  ?  ?  ?Communication Communication ?Communication: No difficulties ?  ?Cognition Arousal/Alertness:  Awake/alert ?Behavior During Therapy: [facility] for tasks assessed/performed ?Overall Cognitive Status: Within Functional Limits for tasks assessed ?  ?  ?  ?  ?  ?  ?  ?  ?  ?  ?  ?  ?  ?  ?  ?  ?  ?  ?  ?   ?   ?   ? ? ?Home Living Family/patient expects to be discharged to:: Private residence ?Living Arrangements: Children ?Available Help at Discharge: Available 24 hours/day;Family ?Type of Home: House ?Home Access: Stairs to enter ?Entrance Stairs-Number of Steps: 4 ?Entrance Stairs-Rails: Right;Left ?Home Layout: Multi-level ?Alternate Level Stairs-Number of Steps: doesn't go downstairs because her daughter lives there ?  ?Bathroom Shower/Tub: Tub/shower unit ?  ?  ?  ?  ?Home Equipment: Agricultural consultant (2 wheels);Cane - single point ?  ?  ?  ? ?  ?Prior Functioning/Environment Prior Level of Function : Independent/Modified Independent ?  ?  ?  ?  ?  ?  ?Mobility Comments: Pt reports that she doesn't stay on her feet long, but was able to drive, run errands, etc. She does endorse some furniture walking. ?ADLs Comments: Pt reports independence with self care tasks ?  ? ?  ?  ?OT Problem List: Decreased strength;Decreased activity tolerance;Impaired balance (sitting and/or standing);Decreased safety awareness;Cardiopulmonary status limiting activity;Pain ?  ?   ?OT Treatment/Interventions: Self-care/ADL training;Therapeutic exercise;Therapeutic activities;Energy conservation;DME and/or AE instruction;Patient/family education;Manual therapy;Balance training  ?  ?OT Goals(Current goals can be found in the care plan section) Acute Rehab OT Goals ?Patient Stated Goal: to go home ?OT Goal Formulation: With patient ?Time For Goal Achievement: 08/03/21 ?Potential to Achieve Goals: Good ?ADL Goals ?Pt Will Perform Lower Body Dressing: with modified independence;sit to/from stand ?Pt Will Transfer to Toilet: with modified independence;ambulating ?Pt Will Perform Toileting - Clothing Manipulation and hygiene: with modified  independence;sit to/from stand  ?OT Frequency: Min 3X/week ?  ? ?   ?AM-PAC OT "6 Clicks" Daily Activity     ?Outcome Measure Help from another person eating meals?: None ?Help from another person taking care of personal grooming?: None ?Help from another person toileting, which includes using toliet, bedpan, or urinal?: A Little ?Help from another person bathing (including washing, rinsing, drying)?: A Little ?Help from another person to put on and taking off regular upper body clothing?: None ?Help from another person to put on and taking off regular lower body clothing?: A Little ?6 Click Score: 21 ?  ?End of Session Nurse Communication: Mobility status;Other (comment) (Pt remains seated on EOB) ? ?Activity Tolerance: Patient limited by pain ?Patient left: in bed;with call bell/phone within reach;with bed alarm set ? ?OT Visit Diagnosis: Unsteadiness on feet (R26.81);Repeated falls (R29.6);Muscle weakness (generalized) (M62.81);History of falling (Z91.81)  ?              ?Time: 2951-8841 ?OT Time Calculation (min): 23 min ?Charges:  OT General Charges ?$OT Visit: 1 Visit ?OT Evaluation ?$OT Eval Moderate Complexity: 1  Mod ?OT Treatments ?$Therapeutic Activity: 8-22 mins ? ?Jackquline DenmarkKatie Satoru Milich, MS, OTR/L , CBIS ?ascom [phone]  ?07/20/21, 4:29 PM  ?

## 2021-07-20 NOTE — Evaluation (Signed)
Physical Therapy Evaluation ?Patient Details ?Name: Katherine Scott ?MRN: [id number] ?DOB: 11/25/1960 ?Today's Date: 07/20/2021 ? ?History of Present Illness ? 61 y.o. female with medical history significant of hypertension, COPD on 1 L oxygen, OSA on CPAP, hypothyroidism, morbid obesity with a BMI 66.49, sCHF with EF 45-50%, atrial fibrillation (stopped taking Eliquis more than 2 months ago), who presents with shortness of breath.  CT angiogram of the chest showed a submassive PE with evidence of acute right heart strain.  Patient was given IV heparin.  Pulmonary thrombectomy was performed on 4/7.  ?Clinical Impression ? Pt eager to start being more active but is dealing with severe pain in b/l feet (new onset neuropathy?) and is therefore hesitant to do much standing.  She showed reasonably good bed mobility but struggled with getting to standing and ultimately needed significant assist to get to standing and displayed very poor standing tolerance with heavy reliance on UEs/walker.  She was on HFNC t/o the exam and mobility tasks and her sats did stay in the 90s, HR 90s up to 110s during activity.  Pt reports that she feels she could do a lot more if her feet weren't hurting and she hopes she will improve and be mobile enough to go home rather than rehab at discharge.   ?   ? ?Recommendations for follow up therapy are one component of a multi-disciplinary discharge planning process, led by the attending physician.  Recommendations may be updated based on patient status, additional functional criteria and insurance authorization. ? ?Follow Up Recommendations Skilled nursing-short term rehab (<3 hours/day) ? ?  ?Assistance Recommended at Discharge Intermittent Supervision/Assistance  ?Patient can return home with the following ? A little help with walking and/or transfers;A lot of help with walking and/or transfers;A little help with bathing/dressing/bathroom;Assistance with cooking/housework;Assist for  transportation;Help with stairs or ramp for entrance ? ?  ?Equipment Recommendations None recommended by PT  ?Recommendations for Other Services ?    ?  ?Functional Status Assessment Patient has had a recent decline in their functional status and demonstrates the ability to make significant improvements in function in a reasonable and predictable amount of time.  ? ?  ?Precautions / Restrictions Precautions ?Precautions: None ?Restrictions ?Weight Bearing Restrictions: No  ? ?  ? ?Mobility ? Bed Mobility ?Overal bed mobility: Needs Assistance ?Bed Mobility: Supine to Sit ?  ?  ?Supine to sit: Min assist, Min guard ?  ?  ?General bed mobility comments: only light assist with getting torso to upright and scooting hips toward EOB ?  ? ?Transfers ?Overall transfer level: Needs assistance ?Equipment used: Rolling walker (2 wheels) ?Transfers: Sit to/from Stand ?Sit to Stand: From elevated surface, Min assist, Mod assist ?  ?  ?  ?  ?  ?General transfer comment: pt made 2 attempts to stand w/o assist, both unsuccessful (even with set up re-set and elevating bed ~2").  PT gave heavier assist on 3rd attempt and she was able to attain standing, though very uncomfortable with b/l foot pain ?  ? ?Ambulation/Gait ?Ambulation/Gait assistance: Min assist ?Gait Distance (Feet): 3 Feet ?Assistive device: Rolling walker (2 wheels) ?  ?  ?  ?  ?General Gait Details: Pt with very poor tolerance (2/2 pain in feet) in WBing/standing - highly reliant on the walker.  O2 remained mid 90s (on HFNC) with HR rising from 90s to 110s with the effort ? ?Stairs ?  ?  ?  ?  ?  ? ?Wheelchair Mobility ?  ? ?  Modified Rankin (Stroke Patients Only) ?  ? ?  ? ?Balance Overall balance assessment: Needs assistance ?Sitting-balance support: Single extremity supported ?Sitting balance-Leahy Scale: Good ?Sitting balance - Comments: Pt was able to maintain sitting EOB w/o assist, good confidence ?  ?  ?Standing balance-Leahy Scale: Poor ?Standing balance  comment: highly reliant on walker, poor WBing tolerance in feet, needing to sit quickly ?  ?  ?  ?  ?  ?  ?  ?  ?  ?  ?  ?   ? ? ? ?Pertinent Vitals/Pain Pain Assessment ?Pain Assessment: 0-10 ?Pain Score: 9  ?Pain Location: b/l feet, new onset neuropathy?  ? ? ?Home Living Family/patient expects to be discharged to:: Private residence ?Living Arrangements: Children ?Available Help at Discharge: Available 24 hours/day;Family ?  ?Home Access: Stairs to enter ?Entrance Stairs-Rails: Right;Left ?Entrance Stairs-Number of Steps: 4 ?  ?  ?Home Equipment: Agricultural consultantolling Walker (2 wheels);Cane - single point ?   ?  ?Prior Function Prior Level of Function : Independent/Modified Independent ?  ?  ?  ?  ?  ?  ?Mobility Comments: Pt reports that she doesn't stay on her feet long, but was able to drive, run errands, etc ?  ?  ? ? ?Hand Dominance  ?   ? ?  ?Extremity/Trunk Assessment  ? Upper Extremity Assessment ?Upper Extremity Assessment: Overall WFL for tasks assessed ?  ? ?Lower Extremity Assessment ?Lower Extremity Assessment: Overall WFL for tasks assessed ?  ? ?   ?Communication  ? Communication: No difficulties  ?Cognition Arousal/Alertness: Awake/alert ?Behavior During Therapy: [facility] for tasks assessed/performed ?Overall Cognitive Status: Within Functional Limits for tasks assessed ?  ?  ?  ?  ?  ?  ?  ?  ?  ?  ?  ?  ?  ?  ?  ?  ?  ?  ?  ? ?  ?General Comments   ? ?  ?Exercises    ? ?Assessment/Plan  ?  ?PT Assessment Patient needs continued PT services  ?PT Problem List Decreased strength;Decreased range of motion;Decreased activity tolerance;Decreased safety awareness;Decreased knowledge of use of DME;Decreased mobility;Decreased balance;Cardiopulmonary status limiting activity ? ?   ?  ?PT Treatment Interventions DME instruction;Gait training;Stair training;Functional mobility training;Therapeutic activities;Therapeutic exercise;Balance training;Patient/family education   ? ?PT Goals (Current goals can be found in the Care  Plan section)  ?Acute Rehab PT Goals ?PT Goal Formulation: With patient ?Time For Goal Achievement: 08/03/21 ?Potential to Achieve Goals: Fair ? ?  ?Frequency Min 2X/week ?  ? ? ?Co-evaluation   ?  ?  ?  ?  ? ? ?  ?AM-PAC PT "6 Clicks" Mobility  ?Outcome Measure Help needed turning from your back to your side while in a flat bed without using bedrails?: A Little ?Help needed moving from lying on your back to sitting on the side of a flat bed without using bedrails?: A Little ?Help needed moving to and from a bed to a chair (including a wheelchair)?: A Lot ?Help needed standing up from a chair using your arms (e.g., wheelchair or bedside chair)?: A Little ?Help needed to walk in hospital room?: A Lot ?Help needed climbing 3-5 steps with a railing? : A Lot ?6 Click Score: 15 ? ?  ?End of Session Equipment Utilized During Treatment: Gait belt ?Activity Tolerance: Patient limited by pain;Patient limited by fatigue ?Patient left: with chair alarm set;with call bell/phone within reach ?Nurse Communication: Mobility status ?PT Visit Diagnosis: Muscle weakness (generalized) (M62.81);Difficulty  in walking, not elsewhere classified (R26.2);Unsteadiness on feet (R26.81);Pain ?Pain - Right/Left: Right ?Pain - part of body: Ankle and joints of foot ?  ? ?Time: 1125-1202 ?PT Time Calculation (min) (ACUTE ONLY): 37 min ? ? ?Charges:   PT Evaluation ?$PT Eval Moderate Complexity: 1 Mod ?PT Treatments ?$Therapeutic Activity: 8-22 mins ?  ?   ? ? ?Malachi Pro, DPT ?07/20/2021, 1:46 PM ? ?

## 2021-07-21 LAB — CBC
HCT: 35.1 % — ABNORMAL LOW (ref 36.0–46.0)
Hemoglobin: 10.8 g/dL — ABNORMAL LOW (ref 12.0–15.0)
MCH: 29.8 pg (ref 26.0–34.0)
MCHC: 30.8 g/dL (ref 30.0–36.0)
MCV: 96.7 fL (ref 80.0–100.0)
Platelets: 188 10*3/uL (ref 150–400)
RBC: 3.63 MIL/uL — ABNORMAL LOW (ref 3.87–5.11)
RDW: 13.3 % (ref 11.5–15.5)
WBC: 8.3 10*3/uL (ref 4.0–10.5)
nRBC: 0 % (ref 0.0–0.2)

## 2021-07-21 LAB — BASIC METABOLIC PANEL
Anion gap: 7 (ref 5–15)
BUN: 25 mg/dL — ABNORMAL HIGH (ref 6–20)
CO2: 33 mmol/L — ABNORMAL HIGH (ref 22–32)
Calcium: 8.9 mg/dL (ref 8.9–10.3)
Chloride: 95 mmol/L — ABNORMAL LOW (ref 98–111)
Creatinine, Ser: 1.08 mg/dL — ABNORMAL HIGH (ref 0.44–1.00)
GFR, Estimated: 59 mL/min — ABNORMAL LOW (ref >60)
Glucose, Bld: 114 mg/dL — ABNORMAL HIGH (ref 70–99)
Potassium: 3.6 mmol/L (ref 3.5–5.1)
Sodium: 135 mmol/L (ref 135–145)

## 2021-07-21 MED ORDER — IPRATROPIUM-ALBUTEROL 0.5-2.5 (3) MG/3ML IN SOLN
3.0000 mL | Freq: Three times a day (TID) | RESPIRATORY_TRACT | Status: DC
  Administered 2021-07-21 – 2021-07-22 (×4): 3 mL via RESPIRATORY_TRACT
  Filled 2021-07-21 (×4): qty 3

## 2021-07-21 NOTE — Assessment & Plan Note (Signed)
Continue Synthroid °

## 2021-07-21 NOTE — Assessment & Plan Note (Signed)
Morbid obesity ?Complicates prognosis.  She does not have insurance which limits her care ?

## 2021-07-21 NOTE — Assessment & Plan Note (Signed)
Patient CT scan had a submassive bilateral PE, associated with cor pulmonale.  Patient is status post pulmonary thrombectomy.  Due to large load of PE, patient is requiring high flow oxygen.  She also have evidence of right-sided congestive heart failure. ?Tolerating Eliquis ?Case management/pharmacist assisting due to cost of Eliquis and no insurance. ?Duplex ultrasound shows no lower extremity DVT ?

## 2021-07-21 NOTE — Plan of Care (Signed)
?  Problem: Education: ?Goal: Knowledge of General Education information will improve ?Description: Including pain rating scale, medication(s)/side effects and non-pharmacologic comfort measures ?Outcome: Progressing ?  ?Problem: Health Behavior/Discharge Planning: ?Goal: Ability to manage health-related needs will improve ?Outcome: Progressing ?  ?Problem: Clinical Measurements: ?Goal: Ability to maintain clinical measurements within normal limits will improve ?Outcome: Progressing ?Goal: Will remain free from infection ?Outcome: Progressing ?Goal: Diagnostic test results will improve ?Outcome: Progressing ?Goal: Respiratory complications will improve ?Outcome: Progressing ?Goal: Cardiovascular complication will be avoided ?Outcome: Progressing ?  ?Problem: Activity: ?Goal: Risk for activity intolerance will decrease ?Outcome: Progressing ?  ?Problem: Nutrition: ?Goal: Adequate nutrition will be maintained ?Outcome: Progressing ?  ?Problem: Coping: ?Goal: Level of anxiety will decrease ?Outcome: Progressing ?  ?Problem: Elimination: ?Goal: Will not experience complications related to bowel motility ?Outcome: Progressing ?Goal: Will not experience complications related to urinary retention ?Outcome: Progressing ?  ?Problem: Pain Managment: ?Goal: General experience of comfort will improve ?Outcome: Progressing ?  ?Problem: Safety: ?Goal: Ability to remain free from injury will improve ?Outcome: Progressing ?  ?Problem: Skin Integrity: ?Goal: Risk for impaired skin integrity will decrease ?Outcome: Progressing ?  ?Problem: Education: ?Goal: Ability to demonstrate management of disease process will improve ?Outcome: Progressing ?Goal: Ability to verbalize understanding of medication therapies will improve ?Outcome: Progressing ?Goal: Individualized Educational Video(s) ?Outcome: Progressing ?  ?Problem: Activity: ?Goal: Capacity to carry out activities will improve ?Outcome: Progressing ?  ?Problem: Cardiac: ?Goal:  Ability to achieve and maintain adequate cardiopulmonary perfusion will improve ?Outcome: Progressing ?  ?Problem: Education: ?Goal: Knowledge of disease or condition will improve ?Outcome: Progressing ?Goal: Knowledge of the prescribed therapeutic regimen will improve ?Outcome: Progressing ?Goal: Individualized Educational Video(s) ?Outcome: Progressing ?  ?Problem: Activity: ?Goal: Ability to tolerate increased activity will improve ?Outcome: Progressing ?Goal: Will verbalize the importance of balancing activity with adequate rest periods ?Outcome: Progressing ?  ?Problem: Respiratory: ?Goal: Ability to maintain a clear airway will improve ?Outcome: Progressing ?Goal: Levels of oxygenation will improve ?Outcome: Progressing ?Goal: Ability to maintain adequate ventilation will improve ?Outcome: Progressing ?  ?Problem: Activity: ?Goal: Ability to tolerate increased activity will improve ?Outcome: Progressing ?  ?Problem: Clinical Measurements: ?Goal: Ability to maintain a body temperature in the normal range will improve ?Outcome: Progressing ?  ?Problem: Respiratory: ?Goal: Ability to maintain adequate ventilation will improve ?Outcome: Progressing ?Goal: Ability to maintain a clear airway will improve ?Outcome: Progressing ?  ?

## 2021-07-21 NOTE — Progress Notes (Signed)
Physical Therapy Treatment ?Patient Details ?Name: Katherine Scott ?MRN: [id number] ?DOB: 1961/01/24 ?Today's Date: 07/21/2021 ? ? ?History of Present Illness 61 y.o. female with medical history significant of hypertension, COPD on 1 L oxygen, OSA on CPAP, hypothyroidism, morbid obesity with a BMI 66.49, sCHF with EF 45-50%, atrial fibrillation (stopped taking Eliquis more than 2 months ago), who presents with shortness of breath.  CT angiogram of the chest showed a submassive PE with evidence of acute right heart strain.  Patient was given IV heparin.  Pulmonary thrombectomy was performed on 4/7. ? ?  ?PT Comments  ? ? Pt remains significantly limited per her baseline, but she was able to do multiple bouts of ambulation this date (15-20 ft with RW and 4L O2) and though she did need to sit due to foot pain and fatigue (HR to nearly 140, SpO2 stable in low 90s).  She showed great effort and per her current trajectory suspect she should be able to return home with assist from daughter rather than previously recommended rehab.   ?Recommendations for follow up therapy are one component of a multi-disciplinary discharge planning process, led by the attending physician.  Recommendations may be updated based on patient status, additional functional criteria and insurance authorization. ? ?Follow Up Recommendations ? Home health PT ?  ?  ?Assistance Recommended at Discharge Intermittent Supervision/Assistance  ?Patient can return home with the following A little help with walking and/or transfers;A lot of help with walking and/or transfers;A little help with bathing/dressing/bathroom;Assistance with cooking/housework;Assist for transportation;Help with stairs or ramp for entrance ?  ?Equipment Recommendations ?    ?  ?Recommendations for Other Services   ? ? ?  ?Precautions / Restrictions Precautions ?Precautions: Fall ?Restrictions ?Weight Bearing Restrictions: No  ?  ? ?Mobility ? Bed Mobility ?Overal bed mobility: Modified  Independent ?Bed Mobility: Supine to Sit ?  ?  ?Supine to sit: HOB elevated ?  ?  ?  ?  ? ?Transfers ?Overall transfer level: Needs assistance ?Equipment used: Rolling walker (2 wheels) ?Transfers: Sit to/from Stand ?Sit to Stand: Min guard ?  ?  ?  ?  ?  ?General transfer comment: 4x sit to stand t/o session.  needed to consistently have cuing for UEs and set up, but did not need direct assist ?  ? ?Ambulation/Gait ?Ambulation/Gait assistance: Min guard ?Gait Distance (Feet): 20 Feet ?Assistive device: Rolling walker (2 wheels) ?  ?  ?  ?  ?General Gait Details: 4 bouts of ambulation (15-20 ft each) with heavy UE use on walker and slow guarded cadence.  She reports increased pain with increased stance time but ultimately had much better tolerance than yesterday's eval.  Pt on 4L O2 during the effort with sats seeming to stay in the low 90s - HR increased to 120s-130s with each effort needing seated rest breaks before next bout of ambulation. ? ? ?Stairs ?Stairs:  (pt deferred 2/2 pain) ?  ?  ?  ?  ? ? ?Wheelchair Mobility ?  ? ?Modified Rankin (Stroke Patients Only) ?  ? ? ?  ?Balance Overall balance assessment: Needs assistance ?Sitting-balance support: No upper extremity supported ?Sitting balance-Leahy Scale: Good ?  ?  ?Standing balance support: Bilateral upper extremity supported ?Standing balance-Leahy Scale: Fair ?Standing balance comment: highly reliant on walker, poor (but improving) WBing tolerance in feet ?  ?  ?  ?  ?  ?  ?  ?  ?  ?  ?  ?  ? ?  ?  Cognition Arousal/Alertness: Awake/alert ?Behavior During Therapy: [facility] for tasks assessed/performed ?Overall Cognitive Status: Within Functional Limits for tasks assessed ?  ?  ?  ?  ?  ?  ?  ?  ?  ?  ?  ?  ?  ?  ?  ?  ?  ?  ?  ? ?  ?Exercises   ? ?  ?General Comments General comments (skin integrity, edema, etc.): Pt extremely motivated to push herself to do some walking. ?  ?  ? ?Pertinent Vitals/Pain Pain Assessment ?Pain Assessment: 0-10 ?Pain Score: 7   ?Pain Location: b/l feet, new onset neuropathy?  ? ? ?Home Living   ?  ?  ?  ?  ?  ?  ?  ?  ?  ?   ?  ?Prior Function    ?  ?  ?   ? ?PT Goals (current goals can now be found in the care plan section) Progress towards PT goals: Progressing toward goals ? ?  ?Frequency ? ? ? Min 2X/week ? ? ? ?  ?PT Plan Discharge plan needs to be updated  ? ? ?Co-evaluation   ?  ?  ?  ?  ? ?  ?AM-PAC PT "6 Clicks" Mobility   ?Outcome Measure ? Help needed turning from your back to your side while in a flat bed without using bedrails?: A Little ?Help needed moving from lying on your back to sitting on the side of a flat bed without using bedrails?: A Little ?Help needed moving to and from a bed to a chair (including a wheelchair)?: A Lot ?Help needed standing up from a chair using your arms (e.g., wheelchair or bedside chair)?: A Little ?Help needed to walk in hospital room?: A Lot ?Help needed climbing 3-5 steps with a railing? : A Lot ?6 Click Score: 15 ? ?  ?End of Session Equipment Utilized During Treatment: Gait belt;Oxygen (4L) ?Activity Tolerance: Patient limited by pain;Patient limited by fatigue ?Patient left: with chair alarm set;with call bell/phone within reach ?Nurse Communication: Mobility status ?PT Visit Diagnosis: Muscle weakness (generalized) (M62.81);Difficulty in walking, not elsewhere classified (R26.2);Unsteadiness on feet (R26.81);Pain ?Pain - Right/Left: Right ?Pain - part of body: Ankle and joints of foot ?  ? ? ?Time: 8099-8338 ?PT Time Calculation (min) (ACUTE ONLY): 46 min ? ?Charges:  $Gait Training: 23-37 mins ?$Therapeutic Activity: 8-22 mins          ?          ? ?Malachi Pro, DPT ?07/21/2021, 2:04 PM ? ?

## 2021-07-21 NOTE — Assessment & Plan Note (Signed)
Rate well controlled on Cardizem.  Eliquis for anticoagulation ?

## 2021-07-21 NOTE — Assessment & Plan Note (Signed)
Secondary to acute pulmonary emboli, chronic systolic congestive heart failure and significant sleep apnea. ?Weaned off to 4 L oxygen via nasal canula ?

## 2021-07-21 NOTE — Assessment & Plan Note (Signed)
Continue bronchodilators. 

## 2021-07-21 NOTE — Assessment & Plan Note (Signed)
Blood pressure stable ? ?

## 2021-07-21 NOTE — TOC Progression Note (Signed)
Transition of Care (TOC) - Progression Note  ? ? ?Patient Details  ?Name: Katherine Scott ?MRN: [id number] ?Date of Birth: 1960-05-01 ? ?Transition of Care (TOC) CM/SW Contact  ?Gildardo Griffes, LCSW ?Phone Number: ?07/21/2021, 1:00 PM ? ?Clinical Narrative:    ? ?Per patient if she goes home and needs Bipap, she is able to provide credit card information to [employer] to get LOG for bipap at discharge.  ? ?CSW notes PT working with patient this morning for goal of dispo to go home with hh.  ? ?Expected Discharge Plan:  (TBD) ?Barriers to Discharge: Continued Medical Work up ? ?Expected Discharge Plan and Services ?Expected Discharge Plan:  (TBD) ?  ?Discharge Planning Services: CM Consult ?  ?Living arrangements for the past 2 months: Single Family Home ?                ?DME Arranged: Bipap ?DME Agency: [employer] ?Date DME Agency Contacted: 07/18/21 ?Time DME Agency Contacted: 1445 ?Representative spoke with at DME Agency: Oletha Cruel ?HH Arranged: NA ?HH Agency: NA ?  ?  ?  ? ? ?Social Determinants of Health (SDOH) Interventions ?  ? ?Readmission Risk Interventions ? ?  07/18/2021  ?  2:12 PM 02/16/2020  ?  1:56 PM 02/08/2019  ? 12:55 PM  ?Readmission Risk Prevention Plan  ?Post Dischage Appt   Complete  ?Medication Screening   Complete  ?Transportation Screening Complete Complete Complete  ?PCP or Specialist Appt within 5-7 Days Complete Not Complete   ?Home Care Screening Complete Not Complete   ?Medication Review (RN CM) Complete Complete   ? ? ?

## 2021-07-21 NOTE — Progress Notes (Addendum)
Mobility Specialist - Progress Note ? ? 07/21/21 1600  ?Mobility  ?Activity Ambulated with assistance in room  ?Level of Assistance Standby assist, set-up cues, supervision of patient - no hands on  ?Assistive Device Front wheel walker  ?Distance Ambulated (ft) 40 ft  ?Activity Response Tolerated well  ?$Mobility charge 1 Mobility  ? ? ?During mobility: 92-105 HR, 88-94% SpO2 ? ? ?Pt lying in bed upon arrival, utilizing 4L. ModI to exit bed with HOB flat. Pt ambulated 10' x 4 in room with supervision--obstacle course set up for rest breaks. Pain in B feet 8/10. SOB however sats maintained >/= 88%. Pt returned to bed with alarm set, needs in reach, family at bedside.  ? ? ?Filiberto Pinks ?Mobility Specialist ?07/21/21, 4:46 PM ? ? ? ? ?

## 2021-07-21 NOTE — Assessment & Plan Note (Signed)
Reviewed prior lab, also looking to [facility] system record, most recent outpatient lab was performed in 02/2020, at that time renal function was normal.  GFR has improved.  AKI resolved ?

## 2021-07-21 NOTE — Assessment & Plan Note (Signed)
Patient has been diagnosed with obstructive sleep apnea.  She was on CPAP for several years until few months ago, she took off CPAP for a few months before admission. ?Patient currently will be on high flow oxygen while BiPAP at nighttime.   ?Discussed with RT and the case management, they will try to set up home CPAP at time of discharge.  May be difficult as she does not have insurance ?

## 2021-07-21 NOTE — Progress Notes (Signed)
?Progress Note ? ? ?Patient: Katherine Scott [id number] DOB: February 18, 1961 DOA: 07/15/2021     6 ?DOS: the patient was seen and examined on 07/21/2021 ?  ?Brief hospital course: ? Katherine Scott is a 61 y.o. female with medical history significant of hypertension, COPD on 1 L oxygen, OSA on CPAP, hypothyroidism, morbid obesity with a BMI 66.49, sCHF with EF 45-50%, atrial fibrillation (stopped taking Eliquis more than 2 months ago), who presents with shortness of breath. ?CT angiogram of the chest showed a submassive PE with evidence of acute right heart strain.  Patient was given IV heparin.  Pulmonary thrombectomy was performed on 4/7. ? ?4/12: Transition to Eliquis.  Started gabapentin for lower extremity neuropathy ?4/13: PT, OT recommends HHPT, OT.  TOC setting of BiPAP/CPAP for home at discharge ? ? ?Assessment and Plan: ?* Pulmonary embolism with acute cor pulmonale ([place]) ?Patient CT scan had a submassive bilateral PE, associated with cor pulmonale.  Patient is status post pulmonary thrombectomy.  Due to large load of PE, patient is requiring high flow oxygen.  She also have evidence of right-sided congestive heart failure. ?Tolerating Eliquis ?Case management/pharmacist assisting due to cost of Eliquis and no insurance. ?Duplex ultrasound shows no lower extremity DVT ? ?OSA (obstructive sleep apnea) ?Patient has been diagnosed with obstructive sleep apnea.  She was on CPAP for several years until few months ago, she took off CPAP for a few months before admission. ?Patient currently will be on high flow oxygen while BiPAP at nighttime.   ?Discussed with RT and the case management, they will try to set up home CPAP at time of discharge.  May be difficult as she does not have insurance ? ?HTN (hypertension) ?Blood pressure stable. ? ?AKI (acute kidney injury) ([place]) ?Reviewed prior lab, also looking to [facility] system record, most recent outpatient lab was performed in 02/2020, at that time renal function was normal.  GFR  has improved.  AKI resolved ? ?Elevated troponin ?Due to PE.  No additional work-up is needed. ? ?Chronic systolic CHF (congestive heart failure) ([place]) ?Net IO Since Admission: -674.76 mL [07/21/21 1724] ? continue IV Lasix. ? ?COPD (chronic obstructive pulmonary disease) ([place]) ?Continue bronchodilators ? ?Chronic atrial fibrillation with RVR (H[facility]) ?Rate well controlled on Cardizem.  Eliquis for anticoagulation ? ?Acute on chronic respiratory failure with hypoxia ([facility]) ?Secondary to acute pulmonary emboli, chronic systolic congestive heart failure and significant sleep apnea. ?Weaned off to 4 L oxygen via nasal canula ? ?BMI 60.0-69.9, adult ([place]) ?Morbid obesity ?Complicates prognosis.  She does not have insurance which limits her care ? ?Acquired hypothyroidism ?Continue Synthroid. ? ? ? ? ?  ? ?Subjective: Leg pain somewhat improved from before.  Willing to work with therapy ? ?Physical Exam: ?Vitals:  ? 07/21/21 0444 07/21/21 0451 07/21/21 0749 07/21/21 1105  ?BP: 136/82  128/76 116/80  ?Pulse: (!) 101  92 (!) 106  ?Resp: 16  18 18   ?Temp:   98.9 ?F (37.2 ?C) 98.1 ?F (36.7 ?C)  ?TempSrc:   Oral Oral  ?SpO2: 97%  98% 93%  ?Weight:  (!) 149.5 kg    ?Height:      ? ?General exam:?Appears calm, ?morbid obese. ?Respiratory system:?Decreased breathing sounds. Respiratory effort normal. ?Cardiovascular system:?S1 &?S2 heard, RRR. No JVD, murmurs, rubs, gallops or clicks. No pedal edema. ?Gastrointestinal system:?Abdomen is nondistended, soft and nontender. No organomegaly or masses felt. Normal bowel sounds heard. ?Central nervous system:?Alert and oriented. No focal neurological deficits. ?Extremities: Symmetric 5 x 5 power. ?  Skin: No rashes, lesions or ulcers ?Psychiatry:?Judgement and insight appear normal. Mood &?affect appropriate.  ? ?Data Reviewed: ? ?There are no new results to review at this time. ? ?Family Communication: None ? ?Disposition: ?Status is: Inpatient ?Remains inpatient appropriate because:  Weaning oxygen and deviously setting up BiPAP/CPAP per home oxygen if needed ? ? Planned Discharge Destination: Home with Home Health ? ? ? DVT prophylaxis-Eliquis ?Time spent: 35 minutes ? ?Author: ?Max Sane, MD ?07/21/2021 5:25 PM ? ?For on call review [email or web address].  ?

## 2021-07-21 NOTE — Assessment & Plan Note (Signed)
Net IO Since Admission: -674.76 mL [07/21/21 1724] ? continue IV Lasix. ?

## 2021-07-21 NOTE — Assessment & Plan Note (Signed)
Due to PE.  No additional work-up is needed. ?

## 2021-07-22 ENCOUNTER — Other Ambulatory Visit: Payer: Self-pay

## 2021-07-22 MED ORDER — GABAPENTIN 100 MG PO CAPS
100.0000 mg | ORAL_CAPSULE | Freq: Three times a day (TID) | ORAL | 0 refills | Status: DC
  Filled 2021-07-22: qty 90, 30d supply, fill #0

## 2021-07-22 NOTE — Progress Notes (Signed)
Occupational Therapy Treatment ?Patient Details ?Name: Katherine Scott ?MRN: [id number] ?DOB: 06-29-1960 ?Today's Date: 07/22/2021 ? ? ?History of present illness 61 y.o. female with medical history significant of hypertension, COPD on 1 L oxygen, OSA on CPAP, hypothyroidism, morbid obesity with a BMI 66.49, sCHF with EF 45-50%, atrial fibrillation (stopped taking Eliquis more than 2 months ago), who presents with shortness of breath.  CT angiogram of the chest showed a submassive PE with evidence of acute right heart strain.  Patient was given IV heparin.  Pulmonary thrombectomy was performed on 4/7. ?  ?OT comments ? Upon entering the room, pt supine in bed and agreeable to OT intervention. Pt performs bed mobility without assistance. Pt stands with min guard from EOB and stands at sink for grooming tasks with close supervision for ~ 10 minutes. Pt takes seated rest break and then ambulates with RW 15' to recliner chair with min guard. Pt remains on 2Ls throughout session and is at 90% or better throughout. Pursed lip breathing exercises throughout. OT educated pt on energy conservation strategies for home. Pt continues to benefit from OT intervention.   ? ?Recommendations for follow up therapy are one component of a multi-disciplinary discharge planning process, led by the attending physician.  Recommendations may be updated based on patient status, additional functional criteria and insurance authorization. ?   ?Follow Up Recommendations ? Home health OT  ?  ?Assistance Recommended at Discharge Intermittent Supervision/Assistance  ?Patient can return home with the following ? A little help with walking and/or transfers;A little help with bathing/dressing/bathroom;Assistance with cooking/housework;Assist for transportation ?  ?Equipment Recommendations ? None recommended by OT  ?  ?   ?Precautions / Restrictions Precautions ?Precautions: Fall ?Restrictions ?Weight Bearing Restrictions: No  ? ? ?  ? ?Mobility Bed  Mobility ?Overal bed mobility: Modified Independent ?  ?  ?  ?  ?  ?  ?  ?  ? ?Transfers ?Overall transfer level: Needs assistance ?Equipment used: Rolling walker (2 wheels), None ?Transfers: Sit to/from Stand ?Sit to Stand: Min guard ?  ?  ?  ?  ?  ?  ?  ?  ?Balance Overall balance assessment: Needs assistance ?Sitting-balance support: No upper extremity supported ?Sitting balance-Leahy Scale: Good ?Sitting balance - Comments: Pt was able to maintain sitting EOB w/o assist, good confidence ?  ?Standing balance support: Bilateral upper extremity supported ?Standing balance-Leahy Scale: Fair ?Standing balance comment: highly reliant on walker, poor (but improving) WBing tolerance in feet ?  ?  ?  ?  ?  ?  ?  ?  ?  ?  ?  ?   ? ?ADL either performed or assessed with clinical judgement  ? ?ADL Overall ADL's : Needs assistance/impaired ?  ?  ?Grooming: Wash/dry face;Oral care;Standing;Min guard ?  ?  ?  ?  ?  ?  ?  ?  ?  ?Toilet Transfer: Min guard;Rolling walker (2 wheels) ?Toilet Transfer Details (indicate cue type and reason): simulated ?  ?  ?  ?  ?  ?  ?  ? ?Extremity/Trunk Assessment Upper Extremity Assessment ?Upper Extremity Assessment: Overall WFL for tasks assessed ?  ?Lower Extremity Assessment ?Lower Extremity Assessment: Overall WFL for tasks assessed ?  ?  ?  ? ?Vision Patient Visual Report: No change from baseline ?  ?  ?   ?   ? ?Cognition Arousal/Alertness: Awake/alert ?Behavior During Therapy: [facility] for tasks assessed/performed ?Overall Cognitive Status: Within Functional Limits for tasks assessed ?  ?  ?  ?  ?  ?  ?  ?  ?  ?  ?  ?  ?  ?  ?  ?  ?  ?  ?  ?   ?   ?   ?   ? ? ?  Pertinent Vitals/ Pain       Pain Assessment ?Pain Assessment: Faces ?Faces Pain Scale: Hurts little more ?Pain Location: B feet ?Pain Descriptors / Indicators: Burning ?Pain Intervention(s): Limited activity within patient's tolerance, Monitored during session, Premedicated before session, Repositioned ? ?   ?   ? ?Frequency ? Min  3X/week  ? ? ? ? ?  ?Progress Toward Goals ? ?OT Goals(current goals can now be found in the care plan section) ? Progress towards OT goals: Progressing toward goals ? ?Acute Rehab OT Goals ?Patient Stated Goal: to go home ?OT Goal Formulation: With patient ?Time For Goal Achievement: 08/03/21 ?Potential to Achieve Goals: Good  ?Plan Discharge plan remains appropriate;Frequency remains appropriate   ? ?   ?AM-PAC OT "6 Clicks" Daily Activity     ?Outcome Measure ? ? Help from another person eating meals?: None ?Help from another person taking care of personal grooming?: None ?Help from another person toileting, which includes using toliet, bedpan, or urinal?: A Little ?Help from another person bathing (including washing, rinsing, drying)?: A Little ?Help from another person to put on and taking off regular upper body clothing?: None ?Help from another person to put on and taking off regular lower body clothing?: A Little ?6 Click Score: 21 ? ?  ?End of Session Equipment Utilized During Treatment: Oxygen;Rolling walker (2 wheels) ? ?OT Visit Diagnosis: Unsteadiness on feet (R26.81);Repeated falls (R29.6);Muscle weakness (generalized) (M62.81);History of falling (Z91.81) ?  ?Activity Tolerance Patient tolerated treatment well ?  ?Patient Left with call bell/phone within reach;in chair ?  ?Nurse Communication Mobility status ?  ? ?   ? ?Time: 0086-7619 ?OT Time Calculation (min): 25 min ? ?Charges: OT General Charges ?$OT Visit: 1 Visit ?OT Treatments ?$Self Care/Home Management : 23-37 mins ? ?Jackquline Denmark, MS, OTR/L , CBIS ?ascom [phone]  ?07/22/21, 11:04 AM  ?

## 2021-07-22 NOTE — TOC Transition Note (Signed)
Transition of Care (TOC) - CM/SW Discharge Note ? ? ?Patient Details  ?Name: Katherine Scott ?MRN: [id number] ?Date of Birth: 1960-06-06 ? ?Transition of Care (TOC) CM/SW Contact:  ?Alberteen Sam, LCSW ?Phone Number: ?07/22/2021, 10:12 AM ? ? ?Clinical Narrative:    ? ?Patient to discharge home today, will receive charity home health PT and OT through [facility] informed of dc today.  ? ?CSW has submitted LOG to [employer] supervisor for CPAP needs, patient has a 63+ year old CPAP that no longer works at home. [employer] to provide CPAP today prior to discharge.  Patient has home O2, made aware to have daughter bring home O2 at time of pick up today for discharge.  ? ?No further discharge needs identified.  ? ?Final next level of care: [place] ?Barriers to Discharge: No Barriers Identified ? ? ?Patient Goals and CMS Choice ?Patient states their goals for this hospitalization and ongoing recovery are:: to go home ?CMS Medicare.gov Compare Post Acute Care list provided to:: Patient ?Choice offered to / list presented to : Patient ? ?Discharge Placement ?  ?           ?  ?  ?  ?  ? ?Discharge Plan and Services ?  ?Discharge Planning Services: CM Consult ?           ?DME Arranged: Continuous positive airway pressure (CPAP) ?DME Agency: [employer] ?Date DME Agency Contacted: 07/22/21 ?Time DME Agency Contacted: [id number] ?Representative spoke with at DME Agency: Thedore Mins ?HH Arranged: PT, OT ?[place] Agency: [facility] ?Date HH Agency Contacted: 07/22/21 ?Time [place]: [id number] spoke with at [place]: Tommi Rumps ? ?Social Determinants of Health (SDOH) Interventions ?  ? ? ?Readmission Risk Interventions ? ?  07/18/2021  ?  2:12 PM 02/16/2020  ?  1:56 PM 02/08/2019  ? 12:55 PM  ?Readmission Risk Prevention Plan  ?Post Dischage Appt   Complete  ?Medication Screening   Complete  ?Transportation Screening Complete Complete Complete  ?PCP or Specialist Appt within 5-7 Days Complete Not Complete   ?Home  Care Screening Complete Not Complete   ?Medication Review (RN CM) Complete Complete   ? ? ? ? ? ?

## 2021-07-22 NOTE — Progress Notes (Signed)
Physical Therapy Treatment ?Patient Details ?Name: Katherine Scott ?MRN: [id number] ?DOB: [birth year] ?Today's Date: 07/22/2021 ? ? ?History of Present Illness 61 y.o. female with medical history significant of hypertension, COPD on 1 L oxygen, OSA on CPAP, hypothyroidism, morbid obesity with a BMI 66.49, sCHF with EF 45-50%, atrial fibrillation (stopped taking Eliquis more than 2 months ago), who presents with shortness of breath.  CT angiogram of the chest showed a submassive PE with evidence of acute right heart strain.  Patient was given IV heparin.  Pulmonary thrombectomy was performed on 4/7. ? ?  ?PT Comments  ? ? Pt seen for PT tx with pt agreeable. Pt is able to complete supine<>Sit with mod I with use of bed rails & bed flat. Pt requires cuing for safe hand placement during STS but otherwise no physical assistance. Pt ambulates short distances with seated rest breaks in between 2/2 fatigue & BLE feet discomfort. SPO2 >/= 90% on 2L/min. Discussed stair negotiation & need for seated rest break at the top of stairs with pt reporting she has a chair to rest in. Will continue to follow pt acutely to progress mobility as able.  ?   ?Recommendations for follow up therapy are one component of a multi-disciplinary discharge planning process, led by the attending physician.  Recommendations may be updated based on patient status, additional functional criteria and insurance authorization. ? ?Follow Up Recommendations ? Home health PT ?  ?  ?Assistance Recommended at Discharge Intermittent Supervision/Assistance  ?Patient can return home with the following A little help with walking and/or transfers;A lot of help with walking and/or transfers;A little help with bathing/dressing/bathroom;Assistance with cooking/housework;Assist for transportation;Help with stairs or ramp for entrance ?  ?Equipment Recommendations ? None recommended by PT  ?  ?Recommendations for Other Services   ? ? ?  ?Precautions / Restrictions  Precautions ?Precautions: Fall ?Restrictions ?Weight Bearing Restrictions: No  ?  ? ?Mobility ? Bed Mobility ?Overal bed mobility: Modified Independent ?Bed Mobility: Supine to Sit, Sit to Supine ?  ?  ?Supine to sit: Modified independent (Device/Increase time), HOB elevated ?Sit to supine: Modified independent (Device/Increase time), HOB elevated ?  ?  ?  ? ?Transfers ?Overall transfer level: Needs assistance ?Equipment used: Rolling walker (2 wheels) ?Transfers: Sit to/from Stand ?Sit to Stand: Supervision ?  ?  ?  ?  ?  ?General transfer comment: cuing for safe hand placement for sit<>stand (reach back for armrests, push up from sitting surface) ?  ? ?Ambulation/Gait ?Ambulation/Gait assistance: Supervision ?Gait Distance (Feet):  (17 ft + 17 ft + 35 ft) ?Assistive device: Rolling walker (2 wheels) ?Gait Pattern/deviations: Decreased step length - right, Decreased step length - left, Decreased stride length ?Gait velocity: decreased ?  ?  ?General Gait Details: Seated rest break between each gait trial. ? ? ?Stairs ?  ?  ?  ?  ?  ? ? ?Wheelchair Mobility ?  ? ?Modified Rankin (Stroke Patients Only) ?  ? ? ?  ?Balance Overall balance assessment: Needs assistance ?Sitting-balance support: No upper extremity supported ?Sitting balance-Leahy Scale: Good ?  ?  ?Standing balance support: Bilateral upper extremity supported, During functional activity ?Standing balance-Leahy Scale: Fair ?  ?  ?  ?  ?  ?  ?  ?  ?  ?  ?  ?  ?  ? ?  ?Cognition Arousal/Alertness: Awake/alert ?Behavior During Therapy: [facility] for tasks assessed/performed ?Overall Cognitive Status: Within Functional Limits for tasks assessed ?  ?  ?  ?  ?  ?  ?  ?  ?  ?  ?  ?  ?  ?  ?  ?  ?  General Comments: Pleasant lady, motivated to participate ?  ?  ? ?  ?Exercises   ? ?  ?General Comments General comments (skin integrity, edema, etc.): Pt on 2L/min with SPO2 >/= 90% throughout. ?  ?  ? ?Pertinent Vitals/Pain Pain Assessment ?Pain Assessment: Faces ?Faces Pain  Scale: Hurts a little bit ?Pain Location: B feet ?Pain Descriptors / Indicators: Discomfort ?Pain Intervention(s): Repositioned, Monitored during session  ? ? ?Home Living   ?  ?  ?  ?  ?  ?  ?  ?  ?  ?   ?  ?Prior Function    ?  ?  ?   ? ?PT Goals (current goals can now be found in the care plan section) Acute Rehab PT Goals ?PT Goal Formulation: With patient ?Time For Goal Achievement: 08/03/21 ?Potential to Achieve Goals: Fair ?Progress towards PT goals: Progressing toward goals ? ?  ?Frequency ? ? ? Min 2X/week ? ? ? ?  ?PT Plan Current plan remains appropriate  ? ? ?Co-evaluation   ?  ?  ?  ?  ? ?  ?AM-PAC PT "6 Clicks" Mobility   ?Outcome Measure ? Help needed turning from your back to your side while in a flat bed without using bedrails?: None ?Help needed moving from lying on your back to sitting on the side of a flat bed without using bedrails?: None ?Help needed moving to and from a bed to a chair (including a wheelchair)?: None ?Help needed standing up from a chair using your arms (e.g., wheelchair or bedside chair)?: None ?Help needed to walk in hospital room?: A Little ?Help needed climbing 3-5 steps with a railing? : A Lot ?6 Click Score: 21 ? ?  ?End of Session Equipment Utilized During Treatment: Oxygen ?Activity Tolerance: Patient tolerated treatment well;Patient limited by fatigue ?Patient left: in bed;with call bell/phone within reach;with bed alarm set ?Nurse Communication: Mobility status ?PT Visit Diagnosis: Muscle weakness (generalized) (M62.81);Difficulty in walking, not elsewhere classified (R26.2);Unsteadiness on feet (R26.81);Pain ?Pain - Right/Left: Right ?Pain - part of body: Ankle and joints of foot ?  ? ? ?Time: 7482-7078 ?PT Time Calculation (min) (ACUTE ONLY): 24 min ? ?Charges:  $Therapeutic Activity: 23-37 mins          ?          ? ?Aleda Grana, PT, DPT ?07/22/21, 3:44 PM ? ? ? ?[name] ?07/22/2021, 3:42 PM ? ?

## 2021-07-24 NOTE — Discharge Summary (Signed)
?Physician Discharge Summary ?  ?Patient: Katherine Scott MRN: [id number] DOB: Sep 07, 1960  ?Admit date:     07/15/2021  ?Discharge date: 07/22/2021  ?Discharge Physician: Max Sane  ? ?PCP: Olin Hauser, DO  ? ?Recommendations at discharge:  ? ? F/up with outpt providers as requested ? ?Discharge Diagnoses: ?Principal Problem: ?  Pulmonary embolism with acute cor pulmonale (HCC) ?Active Problems: ?  Acquired hypothyroidism ?  BMI 60.0-69.9, adult ([place]) ?  Acute on chronic respiratory failure with hypoxia (HCC) ?  Chronic atrial fibrillation with RVR (HCC) ?  COPD (chronic obstructive pulmonary disease) ([place]) ?  Chronic systolic CHF (congestive heart failure) ([place]) ?  Elevated troponin ?  AKI (acute kidney injury) ([place]) ?  HTN (hypertension) ?  OSA (obstructive sleep apnea) ? ?Hospital Course: ? Katherine Scott is a 61 y.o. female with medical history significant of hypertension, COPD on 1 L oxygen, OSA on CPAP, hypothyroidism, morbid obesity with a BMI 66.49, sCHF with EF 45-50%, atrial fibrillation (stopped taking Eliquis more than 2 months ago), who presents with shortness of breath. ?CT angiogram of the chest showed a submassive PE with evidence of acute right heart strain.  Patient was given IV heparin.  Pulmonary thrombectomy was performed on 4/7. ? ?4/12: Transition to Eliquis.  Started gabapentin for lower extremity neuropathy ?4/13: PT, OT recommends HHPT, OT.  TOC setting of BiPAP/CPAP for home at discharge ? ?Assessment and Plan: ?* Pulmonary embolism with acute cor pulmonale ([place]) ?Patient CT scan had a submassive bilateral PE, associated with cor pulmonale.  Patient is status post pulmonary thrombectomy.  Due to large load of PE, patient required high flow oxygen in the beginning but later weaned off to 2 liter O2.  She also have evidence of right-sided congestive heart failure. ?Initially on Heparin drip and transition to PO Eliquis ?Case management/pharmacist assisting due to cost of Eliquis and no  insurance. ?Duplex ultrasound shows no lower extremity DVT ? ?OSA (obstructive sleep apnea) ?Patient has been diagnosed with obstructive sleep apnea.  She was on CPAP for several years until few months ago, she took off CPAP for a few months before admission. ?Discussed with RT and the case management, they've set up home CPAP  ? ?HTN (hypertension) ?Blood pressure stable. ? ?AKI (acute kidney injury) ([place]) ?Reviewed prior lab, also looking to [facility] system record, most recent outpatient lab was performed in 02/2020, at that time renal function was normal.  GFR has improved.  AKI resolved ? ?Elevated troponin ?Due to PE.  No additional work-up is needed. ? ?Chronic systolic CHF (congestive heart failure) ([place]) ?Net IO Since Admission: -674.76 mL [07/21/21 1724] ?Diuresed with Lasix. ? ?COPD (chronic obstructive pulmonary disease) ([place]) ?Continue bronchodilators ? ?Chronic atrial fibrillation with RVR (HCC) ?Rate well controlled on Cardizem.  Eliquis for anticoagulation ? ?Acute on chronic respiratory failure with hypoxia (HCC) ?Secondary to acute pulmonary emboli, chronic systolic congestive heart failure and significant sleep apnea. ?Weaned off to 2 L oxygen via nasal canula and going home with O2. ? ?BMI 60.0-69.9, adult ([place]) ?Morbid obesity ?Complicates prognosis.  She does not have insurance which limits her care ? ?Acquired hypothyroidism ?Continue Synthroid. ? ? ? ? ?  ? ? ?Consultants: Vascular surgery ?Procedures performed: Thrombectomy 4/7  ?Disposition: Home health ?Diet recommendation:  ?Discharge Diet Orders (From admission, onward)  ? ?  Start     Ordered  ? 07/22/21 0000  Diet - low sodium heart healthy       ? 07/22/21 0933  ? ?  ?  ? ?  ? ?  Carb modified diet ?DISCHARGE MEDICATION: ?Allergies as of 07/22/2021   ? ?   Reactions  ? Amiodarone   ? Caused toxicity   ? ?  ? ?  ?Medication List  ?  ? ?STOP taking these medications   ? ?amLODipine 5 MG tablet ?Commonly known as: NORVASC ?  ?cyanocobalamin 2000  MCG tablet ?  ?doxycycline 100 MG tablet ?Commonly known as: VIBRA-TABS ?  ?predniSONE 10 MG tablet ?Commonly known as: DELTASONE ?  ?rivaroxaban 20 MG Tabs tablet ?Commonly known as: XARELTO ?  ?Trelegy Ellipta 100-62.5-25 MCG/ACT Aepb ?Generic drug: Fluticasone-Umeclidin-Vilant ?  ? ?  ? ?TAKE these medications   ? ?albuterol 108 (90 Base) MCG/ACT inhaler ?Commonly known as: VENTOLIN HFA ?INHALE 2 PUFFS INTO THE LUNGS EVERY 6 (SIX) HOURS AS NEEDED FOR WHEEZING. ?  ?diltiazem 240 MG 24 hr capsule ?Commonly known as: DILACOR XR ?Take 240 mg by mouth daily. ?  ?Eliquis 5 MG Tabs tablet ?Generic drug: apixaban ?Take 2 tablets (10 mg total) by mouth 2 (two) times daily for 7 days. ?  ?apixaban 5 MG Tabs tablet ?Commonly known as: ELIQUIS ?Take 1 tablet (5 mg total) by mouth 2 (two) times daily. ?Start taking on: July 25, 2021 ?  ?fluticasone-salmeterol 250-50 MCG/ACT Aepb ?Commonly known as: ADVAIR ?Inhale 1 puff into the lungs in the morning and at bedtime. ?  ?furosemide 40 MG tablet ?Commonly known as: LASIX ?Take 2 tablets (80 mg total) by mouth daily. ?  ?gabapentin 100 MG capsule ?Commonly known as: NEURONTIN ?Take 1 capsule (100 mg total) by mouth 3 (three) times daily. ?  ?levothyroxine 75 MCG tablet ?Commonly known as: SYNTHROID ?TAKE 1 TABLET BY MOUTH DAILY BEFORE BREAKFAST. WILL NEED THYROID LAB & FOLLOW-UP FOR FURTHER REFILLS ?  ?metoprolol succinate 25 MG 24 hr tablet ?Commonly known as: TOPROL-XL ?Take 3 tablets (75 mg total) by mouth daily. ?  ?OXYGEN ?Inhale 2 L/L into the lungs as needed. ?  ?potassium chloride SA 20 MEQ tablet ?Commonly known as: KLOR-CON M ?Take 1 tablet (20 mEq total) by mouth daily. ?What changed: when to take this ?  ?Spacer/Aero-Holding Dorise Bullion ?1 Device by Does not apply route as needed. ?  ? ?  ? ? Follow-up Information   ? ? Olin Hauser, DO. Schedule an appointment as soon as possible for a visit in 1 week(s).   ?Specialty: Family Medicine ?Why: [facility] Discharge F/UP ?Contact information: ?[address] [place] [address] ?[phone] ? ? ?  ?  ? ?  ?  ? ?  ? ?Discharge Exam: ?Filed Weights  ? 07/18/21 1200 07/21/21 0451 07/22/21 0500  ?Weight: (!) 156.5 kg (!) 149.5 kg (!) 150.2 kg  ? ?General exam: Appears calm,  morbid obese. ?Respiratory system: Decreased breathing sounds. Respiratory effort normal. ?Cardiovascular system: S1 & S2 heard, RRR. No JVD, murmurs, rubs, gallops or clicks. No pedal edema. ?Gastrointestinal system: Abdomen is nondistended, soft and nontender. No organomegaly or masses felt. Normal bowel sounds heard. ?Central nervous system: Alert and oriented. No focal neurological deficits. ?Extremities: Symmetric 5 x 5 power. ?Skin: No rashes, lesions or ulcers ?Psychiatry: Judgement and insight appear normal. Mood & affect appropriate.  ? ?Condition at discharge: good ? ?The results of significant diagnostics from this hospitalization (including imaging, microbiology, ancillary and laboratory) are listed below for reference.  ? ?Imaging Studies: ?CT Angio Chest PE W and/or Wo Contrast ? ?Result Date: 07/15/2021 ?CLINICAL DATA:  Shortness of breath. EXAM: CT ANGIOGRAPHY CHEST  WITH CONTRAST TECHNIQUE: Multidetector CT imaging of the chest was performed using the standard protocol during bolus administration of intravenous contrast. Multiplanar CT image reconstructions and MIPs were obtained to evaluate the vascular anatomy. RADIATION DOSE REDUCTION: This exam was performed according to the departmental dose-optimization program which includes automated exposure control, adjustment of the mA and/or kV according to patient size and/or use of iterative reconstruction technique. CONTRAST:  70mL OMNIPAQUE IOHEXOL 350 MG/ML SOLN COMPARISON:  Aug 19, 2019. FINDINGS: Cardiovascular: Filling defects are noted in the upper lobe branches of the left pulmonary artery as well as in lower lobe branches of the right pulmonary artery consistent with acute  pulmonary emboli. RV/LV ratio of 1.0 is noted suggesting possible right heart strain. Mild cardiomegaly is noted. No pericardial effusion is noted. Atherosclerosis of thoracic aorta is noted without aneurys

## 2021-07-25 ENCOUNTER — Other Ambulatory Visit: Payer: Self-pay

## 2021-07-26 ENCOUNTER — Other Ambulatory Visit
Admission: RE | Admit: 2021-07-26 | Discharge: 2021-07-26 | Disposition: A | Discharge status: Home / Self Care | Payer: Medicaid Other | Attending: Nurse Practitioner | Admitting: Nurse Practitioner

## 2021-07-26 ENCOUNTER — Other Ambulatory Visit: Payer: Self-pay

## 2021-07-26 ENCOUNTER — Ambulatory Visit (INDEPENDENT_AMBULATORY_CARE_PROVIDER_SITE_OTHER): Payer: Medicaid Other | Admitting: Nurse Practitioner

## 2021-07-26 ENCOUNTER — Encounter (INDEPENDENT_AMBULATORY_CARE_PROVIDER_SITE_OTHER): Payer: Self-pay | Admitting: Nurse Practitioner

## 2021-07-26 VITALS — BP 128/69 | HR 88 | Resp 16 | Ht 62.0 in | Wt 327.0 lb

## 2021-07-26 DIAGNOSIS — I890 Lymphedema, not elsewhere classified: Secondary | ICD-10-CM | POA: Diagnosis not present

## 2021-07-26 DIAGNOSIS — I2609 Other pulmonary embolism with acute cor pulmonale: Secondary | ICD-10-CM

## 2021-07-26 DIAGNOSIS — M79674 Pain in right toe(s): Secondary | ICD-10-CM | POA: Diagnosis present

## 2021-07-26 DIAGNOSIS — I10 Essential (primary) hypertension: Secondary | ICD-10-CM | POA: Diagnosis not present

## 2021-07-26 LAB — URIC ACID: Uric Acid, Serum: 11.7 mg/dL — ABNORMAL HIGH (ref 2.5–7.1)

## 2021-07-26 MED ORDER — TRAMADOL HCL 50 MG PO TABS: 50.0000 mg | ORAL_TABLET | Freq: Four times a day (QID) | ORAL | 0 refills | Status: DC | PRN

## 2021-07-27 ENCOUNTER — Telehealth (INDEPENDENT_AMBULATORY_CARE_PROVIDER_SITE_OTHER): Payer: Self-pay

## 2021-07-27 NOTE — Telephone Encounter (Signed)
Medrol dose pack prescription was called in and left on pharmacy voicemail. Patient was made aware that pharmacy will contact her when prescription is ready ?

## 2021-07-28 ENCOUNTER — Other Ambulatory Visit: Payer: Self-pay

## 2021-07-28 MED ORDER — METHYLPREDNISOLONE 4 MG PO TBPK
ORAL_TABLET | ORAL | 0 refills | Status: DC
  Filled 2021-07-28: qty 21, 6d supply, fill #0

## 2021-08-07 ENCOUNTER — Encounter (INDEPENDENT_AMBULATORY_CARE_PROVIDER_SITE_OTHER): Payer: Self-pay | Admitting: Nurse Practitioner

## 2021-08-07 NOTE — Progress Notes (Signed)
? ?Subjective:  ? ? [name], female    DOB: 25-Nov-1960, 61 y.o.   MRN: [id number] ?Chief Complaint  ?Patient presents with  ? Follow-up  ?  1 year no studies  ? ? ?Katherine Scott is a 61 year old female that presents today for follow-up following pulmonary thrombectomy.  The patient is also a longstanding patient of ours as we follow her for lymphedema.  The patient is feeling much better in regards to her breathing and her ability with activity levels.  In addition to a pulmonary thrombectomy the patient was also heavily diuresed during this time.  She began to experience pain in her right toe that is worse at night.  She notes that it is a sharp shooting pain.  She denies any claudication-like symptoms.  There is no discoloration of the toe.  All DVT studies in the hospital were negative ? ? ?Review of Systems  ?Cardiovascular:  Positive for leg swelling.  ?Musculoskeletal:  Positive for arthralgias.  ?All other systems reviewed and are negative. ? ?   ?Objective:  ? Physical Exam ?Vitals reviewed.  ?HENT:  ?   Head: Normocephalic.  ?Cardiovascular:  ?   Rate and Rhythm: Normal rate.  ?   Pulses: Normal pulses.  ?Pulmonary:  ?   Effort: Pulmonary effort is normal.  ?Skin: ?   General: Skin is warm and dry.  ?Neurological:  ?   Mental Status: She is alert and oriented to person, place, and time.  ?Psychiatric:     ?   Mood and Affect: Mood normal.     ?   Behavior: Behavior normal.     ?   Thought Content: Thought content normal.  ? ? ?BP 128/69 (BP Location: Right Arm)   Pulse 88   Resp 16   Ht 5\' 2"  (1.575 m)   Wt (!) 327 lb (148.3 kg)   BMI 59.81 kg/m?  ? ?Past Medical History:  ?Diagnosis Date  ? Abnormal mammogram, unspecified 2013  ? Atrial fibrillation (HCC)   ? 2013  ? Breast screening, unspecified   ? CHF (congestive heart failure) (HCC)   ? COPD (chronic obstructive pulmonary disease) (HCC)   ? History of continuous positive airway pressure (CPAP) therapy at home   ? Hypothyroidism   ?  Obesity, unspecified   ? Pneumonia   ? Screening for obesity   ? Sleep apnea   ? Special screening for malignant neoplasms, colon   ? Unspecified essential hypertension   ? ? ?Social History  ? ?Socioeconomic History  ? Marital status: Divorced  ?  Spouse name: Not on file  ? Number of children: Not on file  ? Years of education: Not on file  ? Highest education level: Not on file  ?Occupational History  ? Not on file  ?Tobacco Use  ? Smoking status: Never  ?  Passive exposure: Yes  ? Smokeless tobacco: Never  ?Vaping Use  ? Vaping Use: Never used  ?Substance and Sexual Activity  ? Alcohol use: No  ? Drug use: No  ? Sexual activity: Not on file  ?Other Topics Concern  ? Not on file  ?Social History Narrative  ? Not on file  ? ?Social Determinants of Health  ? ?Financial Resource Strain: Not on file  ?Food Insecurity: Not on file  ?Transportation Needs: Not on file  ?Physical Activity: Not on file  ?Stress: Not on file  ?Social Connections: Not on file  ?Intimate Partner Violence: Not on file  ? ? ?  Past Surgical History:  ?Procedure Laterality Date  ? ABDOMINAL HYSTERECTOMY  2011  ? ANTERIOR CRUCIATE LIGAMENT REPAIR  2003  ? BREAST BIOPSY Left January 22, 2012  ? left breast stereotactic biopsy showed fibroadenomatous changes with microcalcifications, fat necrosis, and sclerosing adenosis and columnar cell changes. No evidence of atypia or malignancy.  ? CARDIOVERSION N/A 09/22/2016  ? Procedure: CARDIOVERSION;  Surgeon: Dalia HeadingFath, Kenneth A, MD;  Location: [facility] ORS;  Service: Cardiovascular;  Laterality: N/A;  ? CARDIOVERSION N/A 01/17/2018  ? Procedure: CARDIOVERSION;  Surgeon: Dalia HeadingFath, Kenneth A, MD;  Location: [facility] ORS;  Service: Cardiovascular;  Laterality: N/A;  ? CARDIOVERSION N/A 01/09/2019  ? Procedure: CARDIOVERSION;  Surgeon: Dalia HeadingFath, Kenneth A, MD;  Location: [facility] ORS;  Service: Cardiovascular;  Laterality: N/A;  ? CESAREAN SECTION  1985, 1987, and 1998  ? COLONOSCOPY  2011  ? Dr. Mechele CollinElliott  ? ELECTROPHYSIOLOGIC STUDY  N/A 12/03/2014  ? Procedure: Cardioversion;  Surgeon: Dalia HeadingKenneth A Fath, MD;  Location: [facility] ORS;  Service: Cardiovascular;  Laterality: N/A;  ? ELECTROPHYSIOLOGIC STUDY N/A 04/01/2015  ? Procedure: Cardioversion;  Surgeon: Dalia HeadingKenneth A Fath, MD;  Location: [facility] ORS;  Service: Cardiovascular;  Laterality: N/A;  ? KNEE SURGERY  2013  ? PULMONARY THROMBECTOMY Bilateral 07/15/2021  ? Procedure: PULMONARY THROMBECTOMY;  Surgeon: Annice Needyew, Jason S, MD;  Location: [facility];  Service: Cardiovascular;  Laterality: Bilateral;  ? ? ?Family History  ?Problem Relation Age of Onset  ? Colon cancer Mother   ?     diagnosis age 61's  ? Colon cancer Sister   ?     diagnosis age 61  ? Atrial fibrillation Sister   ? Breast cancer Other   ?     Maternal Grandmother; diagnosis age 61's  ? Lung cancer Other   ?     Paternal Grandmother; diagnosis age 61's  ? Colon cancer Other   ?     Paternal Grandfather; diagnosis age 570's  ? Heart disease Father   ? ? ?Allergies  ?Allergen Reactions  ? Amiodarone   ?  Caused toxicity   ? ? ? ?  Latest Ref Rng & Units 07/21/2021  ?  5:50 AM 07/20/2021  ?  5:08 AM 07/19/2021  ?  5:40 AM  ?CBC  ?WBC 4.0 - 10.5 K/uL 8.3   9.0   7.0    ?Hemoglobin 12.0 - 15.0 g/dL 16.110.8   09.610.9   04.511.0    ?Hematocrit 36.0 - 46.0 % 35.1   35.8   35.5    ?Platelets 150 - 400 K/uL 188   160   153    ? ? ? ? ?CMP  ?   ?Component Value Date/Time  ? NA 135 07/21/2021 0550  ? NA 141 11/29/2011 0432  ? K 3.6 07/21/2021 0550  ? K 4.1 11/29/2011 0432  ? CL 95 (L) 07/21/2021 0550  ? CL 106 11/29/2011 0432  ? CO2 33 (H) 07/21/2021 0550  ? CO2 30 11/29/2011 0432  ? GLUCOSE 114 (H) 07/21/2021 0550  ? GLUCOSE 121 (H) 11/29/2011 0432  ? BUN 25 (H) 07/21/2021 0550  ? BUN 11 11/29/2011 0432  ? CREATININE 1.08 (H) 07/21/2021 0550  ? CREATININE 0.90 01/19/2017 0925  ? CALCIUM 8.9 07/21/2021 0550  ? CALCIUM 8.2 (L) 11/29/2011 0432  ? PROT 7.8 07/15/2021 0748  ? ALBUMIN 3.7 07/15/2021 0748  ? AST 16 07/15/2021 0748  ? ALT 16 07/15/2021 0748  ? ALKPHOS  68 07/15/2021 0748  ? BILITOT 1.1 07/15/2021  4193  ? GFRNONAA 59 (L) 07/21/2021 0550  ? GFRNONAA >60 11/29/2011 0432  ? GFRAA >60 03/20/2019 1619  ? GFRAA >60 11/29/2011 0432  ? ? ? ?No results found. ? ?   ?Assessment & Plan:  ? ?1. Pain of toe of right foot ?I suspect the patient may be suffering from gout due to her recent extensive diuresis.  We will order a uric acid level and if elevated, we will send in a prednisone pack.  Otherwise, patient is advised to discuss with PCP ?- Uric acid; Future ? ?2. Other acute pulmonary embolism with acute cor pulmonale (HCC) ?The patient's breathing is much improved from her pulmonary embolism following her pulmonary thrombectomy.  Patient will remain on Eliquis for lifetime as she also has atrial fibrillation. ? ?3. Primary hypertension ?Continue antihypertensive medications as already ordered, these medications have been reviewed and there are no changes at this time.  ? ?4. Lymphedema ?We will have the patient return in 1 year for follow-up with her lymphedema.  Patient is advised to follow-up sooner if she begins to have issues with lower extremity edema or swelling. ? ? ?Current Outpatient Medications on File Prior to Visit  ?Medication Sig Dispense Refill  ? albuterol (VENTOLIN HFA) 108 (90 Base) MCG/ACT inhaler INHALE 2 PUFFS INTO THE LUNGS EVERY 6 (SIX) HOURS AS NEEDED FOR WHEEZING. 6.7 each 5  ? apixaban (ELIQUIS) 5 MG TABS tablet Take 1 tablet (5 mg total) by mouth 2 (two) times daily. 60 tablet 0  ? diltiazem (DILACOR XR) 240 MG 24 hr capsule Take 240 mg by mouth daily.    ? fluticasone-salmeterol (ADVAIR) 250-50 MCG/ACT AEPB Inhale 1 puff into the lungs in the morning and at bedtime.    ? furosemide (LASIX) 40 MG tablet Take 2 tablets (80 mg total) by mouth daily. 180 tablet 3  ? gabapentin (NEURONTIN) 100 MG capsule Take 1 capsule (100 mg total) by mouth 3 (three) times daily. 90 capsule 0  ? OXYGEN Inhale 2 L/L into the lungs as needed.     ? potassium chloride  SA (K-DUR,KLOR-CON) 20 MEQ tablet Take 1 tablet (20 mEq total) by mouth daily. (Patient taking differently: Take 20 mEq by mouth at bedtime.) 30 tablet 6  ? Spacer/Aero-Holding Deretha Emory DEVI 1 Device by Doe

## 2021-08-11 ENCOUNTER — Other Ambulatory Visit: Payer: Self-pay

## 2021-08-23 ENCOUNTER — Telehealth: Payer: Self-pay | Admitting: Pharmacy Technician

## 2021-08-23 NOTE — Telephone Encounter (Signed)
Received updated proof of income.  Patient eligible to receive medication assistance at [facility] until time for re-certification in 2024, and as long as eligibility requirements continue to be met. ? ?Chenille Toor J. Rage Beever ?Care Manager ?[facility]  ?

## 2021-08-24 ENCOUNTER — Other Ambulatory Visit: Payer: Self-pay

## 2021-08-29 ENCOUNTER — Other Ambulatory Visit: Payer: Self-pay

## 2021-09-01 ENCOUNTER — Encounter: Payer: Self-pay | Admitting: Pharmacy Technician

## 2021-09-01 ENCOUNTER — Other Ambulatory Visit (INDEPENDENT_AMBULATORY_CARE_PROVIDER_SITE_OTHER): Payer: Self-pay | Admitting: Nurse Practitioner

## 2021-09-01 ENCOUNTER — Other Ambulatory Visit: Payer: Self-pay

## 2021-09-01 DIAGNOSIS — M79669 Pain in unspecified lower leg: Secondary | ICD-10-CM

## 2021-09-01 NOTE — Telephone Encounter (Signed)
Patient has Medicaid.  No longer qualifies for free medications.  Katherine Scott Patient Advocate Specialist [place]

## 2021-09-02 ENCOUNTER — Encounter (INDEPENDENT_AMBULATORY_CARE_PROVIDER_SITE_OTHER): Payer: Self-pay | Admitting: Nurse Practitioner

## 2021-09-02 ENCOUNTER — Ambulatory Visit (INDEPENDENT_AMBULATORY_CARE_PROVIDER_SITE_OTHER): Payer: Medicaid Other

## 2021-09-02 ENCOUNTER — Ambulatory Visit (INDEPENDENT_AMBULATORY_CARE_PROVIDER_SITE_OTHER): Payer: Medicaid Other | Admitting: Nurse Practitioner

## 2021-09-02 VITALS — BP 159/89 | HR 123 | Resp 17 | Ht 62.0 in | Wt 337.0 lb

## 2021-09-02 DIAGNOSIS — M79674 Pain in right toe(s): Secondary | ICD-10-CM | POA: Diagnosis not present

## 2021-09-02 DIAGNOSIS — L03119 Cellulitis of unspecified part of limb: Secondary | ICD-10-CM

## 2021-09-02 DIAGNOSIS — M79669 Pain in unspecified lower leg: Secondary | ICD-10-CM | POA: Diagnosis not present

## 2021-09-02 DIAGNOSIS — I890 Lymphedema, not elsewhere classified: Secondary | ICD-10-CM | POA: Diagnosis not present

## 2021-09-02 DIAGNOSIS — M7989 Other specified soft tissue disorders: Secondary | ICD-10-CM

## 2021-09-02 MED ORDER — CEFDINIR 300 MG PO CAPS: 600.0000 mg | ORAL_CAPSULE | Freq: Every day | ORAL | 0 refills | Status: DC

## 2021-09-02 MED ORDER — TRAMADOL HCL 50 MG PO TABS: 50.0000 mg | ORAL_TABLET | Freq: Four times a day (QID) | ORAL | 0 refills | Status: DC | PRN

## 2021-09-11 ENCOUNTER — Encounter (INDEPENDENT_AMBULATORY_CARE_PROVIDER_SITE_OTHER): Payer: Self-pay | Admitting: Nurse Practitioner

## 2021-09-11 NOTE — Progress Notes (Signed)
Subjective:    [name], female    DOB: Sep 01, 1960, 61 y.o.   MRN: [id number] No chief complaint on file.   Katherine Scott is a 61 year old female that presents today for follow-up evaluation of noted left lower extremity swelling and redness.  The patient has had a previous history of multiple episodes of cellulitis.  The patient is currently on anticoagulation for recent pulmonary embolism.  She notes that she began to have redness and pain in her lower extremity and given her history she contacted our office.  She denies any open wounds or ulcerations.  Currently there is no weeping.  Today noninvasive study showed no evidence of DVT or superficial venous thrombosis bilaterally.  There is no evidence of deep venous insufficiency in the left lower extremity with no evidence of superficial venous reflux bilaterally.   Review of Systems  Cardiovascular:  Positive for leg swelling.  Skin:  Positive for color change.  All other systems reviewed and are negative.     Objective:   Physical Exam Vitals reviewed.  HENT:     Head: Normocephalic.  Cardiovascular:     Rate and Rhythm: Normal rate.  Pulmonary:     Effort: Pulmonary effort is normal.  Musculoskeletal:     Right lower leg: Edema present.     Left lower leg: Edema present.  Skin:    General: Skin is warm and dry.  Neurological:     Mental Status: She is alert and oriented to person, place, and time.  Psychiatric:        Mood and Affect: Mood normal.        Behavior: Behavior normal.        Thought Content: Thought content normal.        Judgment: Judgment normal.    BP (!) 159/89 (BP Location: Right Arm)   Pulse (!) 123   Resp 17   Ht 5\' 2"  (1.575 m)   Wt (!) 337 lb (152.9 kg)   BMI 61.64 kg/m   Past Medical History:  Diagnosis Date   Abnormal mammogram, unspecified 2013   Atrial fibrillation (HCC)    2013   Breast screening, unspecified    CHF (congestive heart failure) (HCC)    COPD (chronic  obstructive pulmonary disease) (HCC)    History of continuous positive airway pressure (CPAP) therapy at home    Hypothyroidism    Obesity, unspecified    Pneumonia    Screening for obesity    Sleep apnea    Special screening for malignant neoplasms, colon    Unspecified essential hypertension     Social History   Socioeconomic History   Marital status: Divorced    Spouse name: Not on file   Number of children: Not on file   Years of education: Not on file   Highest education level: Not on file  Occupational History   Not on file  Tobacco Use   Smoking status: Never    Passive exposure: Yes   Smokeless tobacco: Never  Vaping Use   Vaping Use: Never used  Substance and Sexual Activity   Alcohol use: No   Drug use: No   Sexual activity: Not on file  Other Topics Concern   Not on file  Social History Narrative   Not on file   Social Determinants of Health   Financial Resource Strain: Not on file  Food Insecurity: Not on file  Transportation Needs: Not on file  Physical Activity: Not on  file  Stress: Not on file  Social Connections: Not on file  Intimate Partner Violence: Not on file    Past Surgical History:  Procedure Laterality Date   ABDOMINAL HYSTERECTOMY  2011   ANTERIOR CRUCIATE LIGAMENT REPAIR  2003   BREAST BIOPSY Left January 22, 2012   left breast stereotactic biopsy showed fibroadenomatous changes with microcalcifications, fat necrosis, and sclerosing adenosis and columnar cell changes. No evidence of atypia or malignancy.   CARDIOVERSION N/A 09/22/2016   Procedure: CARDIOVERSION;  Surgeon: Dalia Heading, MD;  Location: [facility];  Service: Cardiovascular;  Laterality: N/A;   CARDIOVERSION N/A 01/17/2018   Procedure: CARDIOVERSION;  Surgeon: Dalia Heading, MD;  Location: [facility];  Service: Cardiovascular;  Laterality: N/A;   CARDIOVERSION N/A 01/09/2019   Procedure: CARDIOVERSION;  Surgeon: Dalia Heading, MD;  Location: [facility];  Service:  Cardiovascular;  Laterality: N/A;   CESAREAN SECTION  1985, 1987, and 1998   COLONOSCOPY  2011   Dr. Mechele Collin   ELECTROPHYSIOLOGIC STUDY N/A 12/03/2014   Procedure: Cardioversion;  Surgeon: Dalia Heading, MD;  Location: [facility];  Service: Cardiovascular;  Laterality: N/A;   ELECTROPHYSIOLOGIC STUDY N/A 04/01/2015   Procedure: Cardioversion;  Surgeon: Dalia Heading, MD;  Location: [facility];  Service: Cardiovascular;  Laterality: N/A;   KNEE SURGERY  2013   PULMONARY THROMBECTOMY Bilateral 07/15/2021   Procedure: PULMONARY THROMBECTOMY;  Surgeon: Annice Needy, MD;  Location: [facility];  Service: Cardiovascular;  Laterality: Bilateral;    Family History  Problem Relation Age of Onset   Colon cancer Mother        diagnosis age 17's   Colon cancer Sister        diagnosis age 38   Atrial fibrillation Sister    Breast cancer Other        Maternal Grandmother; diagnosis age 2's   Lung cancer Other        Paternal Grandmother; diagnosis age 44's   Colon cancer Other        Paternal Grandfather; diagnosis age 74's   Heart disease Father     Allergies  Allergen Reactions   Amiodarone     Caused toxicity        Latest Ref Rng & Units 07/21/2021    5:50 AM 07/20/2021    5:08 AM 07/19/2021    5:40 AM  CBC  WBC 4.0 - 10.5 K/uL 8.3   9.0   7.0    Hemoglobin 12.0 - 15.0 g/dL 73.5   32.9   92.4    Hematocrit 36.0 - 46.0 % 35.1   35.8   35.5    Platelets 150 - 400 K/uL 188   160   153        CMP     Component Value Date/Time   NA 135 07/21/2021 0550   NA 141 11/29/2011 0432   K 3.6 07/21/2021 0550   K 4.1 11/29/2011 0432   CL 95 (L) 07/21/2021 0550   CL 106 11/29/2011 0432   CO2 33 (H) 07/21/2021 0550   CO2 30 11/29/2011 0432   GLUCOSE 114 (H) 07/21/2021 0550   GLUCOSE 121 (H) 11/29/2011 0432   BUN 25 (H) 07/21/2021 0550   BUN 11 11/29/2011 0432   CREATININE 1.08 (H) 07/21/2021 0550   CREATININE 0.90 01/19/2017 0925   CALCIUM 8.9 07/21/2021 0550   CALCIUM 8.2  (L) 11/29/2011 0432   PROT 7.8 07/15/2021 0748   ALBUMIN 3.7 07/15/2021 0748  AST 16 07/15/2021 0748   ALT 16 07/15/2021 0748   ALKPHOS 68 07/15/2021 0748   BILITOT 1.1 07/15/2021 0748   GFRNONAA 59 (L) 07/21/2021 0550   GFRNONAA >60 11/29/2011 0432   GFRAA >60 03/20/2019 1619   GFRAA >60 11/29/2011 0432     No results found.     Assessment & Plan:   1. Lymphedema Patient is advised to continue with conservative therapy tactics including use of elevation and activity.  She should also continue to utilize her compression socks to help with control of symptoms of lymphedema as well as cellulitis.  2. Recurrent cellulitis of lower extremity The patient has a history of cellulitis that can be difficult to treat if progresses.  We will place the patient on cefdinir today to treat cellulitis.  As the area is tender we will also prescribe tramadol for patient. - cefdinir (OMNICEF) 300 MG capsule; Take 2 capsules (600 mg total) by mouth daily.  Dispense: 20 capsule; Refill: 0 - traMADol (ULTRAM) 50 MG tablet; Take 1 tablet (50 mg total) by mouth every 6 (six) hours as needed.  Dispense: 30 tablet; Refill: 0  3. Pain of toe of right foot This is resolved post prednisone.  I suspect this was related to gout as noted previously.   Current Outpatient Medications on File Prior to Visit  Medication Sig Dispense Refill   albuterol (VENTOLIN HFA) 108 (90 Base) MCG/ACT inhaler INHALE 2 PUFFS INTO THE LUNGS EVERY 6 (SIX) HOURS AS NEEDED FOR WHEEZING. 6.7 each 5   apixaban (ELIQUIS) 5 MG TABS tablet Take 1 tablet (5 mg total) by mouth 2 (two) times daily. 60 tablet 0   diltiazem (DILACOR XR) 240 MG 24 hr capsule Take 240 mg by mouth daily.     furosemide (LASIX) 40 MG tablet Take 2 tablets (80 mg total) by mouth daily. 180 tablet 3   gabapentin (NEURONTIN) 100 MG capsule Take 1 capsule (100 mg total) by mouth 3 (three) times daily. 90 capsule 0   OXYGEN Inhale 2 L/L into the lungs as needed.       potassium chloride SA (K-DUR,KLOR-CON) 20 MEQ tablet Take 1 tablet (20 mEq total) by mouth daily. (Patient taking differently: Take 20 mEq by mouth at bedtime.) 30 tablet 6   Spacer/Aero-Holding Chambers DEVI 1 Device by Does not apply route as needed. 1 each 0   No current facility-administered medications on file prior to visit.    There are no Patient Instructions on file for this visit. No follow-ups on file.   Georgiana SpinnerFallon E Diangelo Radel, NP

## 2021-09-19 ENCOUNTER — Ambulatory Visit (INDEPENDENT_AMBULATORY_CARE_PROVIDER_SITE_OTHER): Payer: Medicaid Other | Admitting: Family Medicine

## 2021-09-19 ENCOUNTER — Encounter: Payer: Self-pay | Admitting: Family Medicine

## 2021-09-19 VITALS — BP 130/72 | HR 101 | Ht 62.0 in | Wt 341.0 lb

## 2021-09-19 DIAGNOSIS — L03119 Cellulitis of unspecified part of limb: Secondary | ICD-10-CM | POA: Diagnosis not present

## 2021-09-19 DIAGNOSIS — B379 Candidiasis, unspecified: Secondary | ICD-10-CM

## 2021-09-19 MED ORDER — FLUCONAZOLE 150 MG PO TABS: ORAL_TABLET | ORAL | 1 refills | Status: DC

## 2021-09-19 MED ORDER — CEPHALEXIN 500 MG PO CAPS: 500.0000 mg | ORAL_CAPSULE | Freq: Three times a day (TID) | ORAL | 0 refills | Status: DC

## 2021-09-19 MED ORDER — SULFAMETHOXAZOLE-TRIMETHOPRIM 800-160 MG PO TABS: 1.0000 | ORAL_TABLET | Freq: Two times a day (BID) | ORAL | 0 refills | Status: DC

## 2021-09-19 NOTE — Patient Instructions (Addendum)
Thank you for coming to the office today.  Start the dual antibiotic treatment, both for 10 days.  Follow up if not improving  If not improving you may need to return for re-evaluation. But if more severe worsening such as spreading redness or streaking redness, significantly larger size, persistent drainage of pus, increased pain, fevers/chills, nausea vomiting and cannot take antibiotic. If significantly worse symptoms or most of these symptoms, would recommend going straight to Hospital Emergency Dept as you may require IV antibiotics instead.   Please schedule a Follow-up Appointment to: Return if symptoms worsen or fail to improve.  If you have any other questions or concerns, please feel free to call the office or send a message through MyChart. You may also schedule an earlier appointment if necessary.  Additionally, you may be receiving a survey about your experience at our office within a few days to 1 week by e-mail or mail. We value your feedback.  Saralyn Pilar, DO [facility], [place]

## 2021-09-19 NOTE — Progress Notes (Signed)
Subjective:    [name], female    DOB: September 10, 1960, 61 y.o.   MRN: [id number]  Katherine Scott is a 61 y.o. female presenting on 09/19/2021 for Cellulitis   HPI  Cellulitis lower extremity  April 2023, she was hospitalized for issue with blood clots, she followed w/ Vascular specialists and they did evaluation with lower extremity doppler US and did not have blood clots. She was started on Cefdinir 600mg  x 2 daily for 10 days. It was helpful but then seems worsening off antibiotic in past has required x 2 courses of antibiotics.  Back in 2021 she had Unna boot wraps to treat infection cellulitis  In past history has had cellulitis before.      08/06/2019    3:37 PM 03/31/2019   10:14 AM 02/14/2019    4:13 PM  Depression screen PHQ 2/9  Decreased Interest 0 0 0  Down, Depressed, Hopeless 0 0 0  PHQ - 2 Score 0 0 0    Social History   Tobacco Use   Smoking status: Never    Passive exposure: Yes   Smokeless tobacco: Never  Vaping Use   Vaping Use: Never used  Substance Use Topics   Alcohol use: No   Drug use: No    Review of Systems Per HPI unless specifically indicated above     Objective:    BP 130/72   Pulse (!) 101   Ht 5\' 2"  (1.575 m)   Wt (!) 341 lb (154.7 kg)   SpO2 94%   BMI 62.37 kg/m   Wt Readings from Last 3 Encounters:  09/19/21 (!) 341 lb (154.7 kg)  09/02/21 (!) 337 lb (152.9 kg)  07/26/21 (!) 327 lb (148.3 kg)    Physical Exam Vitals and nursing note reviewed.  Constitutional:      General: She is not in acute distress.    Appearance: Normal appearance. She is well-developed. She is not diaphoretic.     Comments: Well-appearing, comfortable, cooperative  HENT:     Head: Normocephalic and atraumatic.  Eyes:     General:        Right eye: No discharge.        Left eye: No discharge.     Conjunctiva/sclera: Conjunctivae normal.  Cardiovascular:     Rate and Rhythm: Normal rate.  Pulmonary:     Effort: Pulmonary effort is  normal.  Musculoskeletal:     Right lower leg: Edema present.     Left lower leg: Edema present.  Skin:    General: Skin is warm and dry.     Findings: Erythema present. No rash.  Neurological:     Mental Status: She is alert and oriented to person, place, and time.  Psychiatric:        Mood and Affect: Mood normal.        Behavior: Behavior normal.        Thought Content: Thought content normal.     Comments: Well groomed, good eye contact, normal speech and thoughts    Results for orders placed or performed during the hospital encounter of 07/26/21  Uric acid  Result Value Ref Range   Uric Acid, Serum 11.7 (H) 2.5 - 7.1 mg/dL      Assessment & Plan:   Problem List Items Addressed This Visit     Recurrent cellulitis of lower extremity - Primary   Relevant Medications   sulfamethoxazole-trimethoprim (BACTRIM DS) 800-160 MG tablet   cephALEXin (KEFLEX)  500 MG capsule   Other Visit Diagnoses     Antibiotic-induced yeast infection       Relevant Medications   sulfamethoxazole-trimethoprim (BACTRIM DS) 800-160 MG tablet   cephALEXin (KEFLEX) 500 MG capsule   fluconazole (DIFLUCAN) 150 MG tablet       Recurrent Cellulitis lower ext bilateral No ulceration or abscess Blistering reaction  Repeat antibiotic course orally in past has been successful with repeat course She has followed w/ Vascular, already seen recently. In past required Unna boots  Recently finished Cephalosporin 10 day course Will add Keflex 500 TID x 10 days and Bactrim DS BID x 10 days  Return precautions for vascular vs hospital ED if warranted IV antibiotics   Meds ordered this encounter  Medications   sulfamethoxazole-trimethoprim (BACTRIM DS) 800-160 MG tablet    Sig: Take 1 tablet by mouth 2 (two) times daily.    Dispense:  20 tablet    Refill:  0   cephALEXin (KEFLEX) 500 MG capsule    Sig: Take 1 capsule (500 mg total) by mouth 3 (three) times daily. For 7 days    Dispense:  30 capsule     Refill:  0   fluconazole (DIFLUCAN) 150 MG tablet    Sig: Take one tablet by mouth on Day 1. Repeat dose 2nd tablet on Day 3.    Dispense:  2 tablet    Refill:  1      Follow up plan: Return if symptoms worsen or fail to improve.   Saralyn Pilar, DO [facility] 09/19/2021, 4:11 PM

## 2021-11-09 ENCOUNTER — Telehealth: Payer: Self-pay | Admitting: Family Medicine

## 2021-11-09 NOTE — Telephone Encounter (Signed)
Patient will need a follow up office visit for Sleep Apnea review and re-certification for her CPAP Supply Orders.  I have the paperwork but it requires office visit medical record with documentation to be faxed.  Saralyn Pilar, DO [facility] 11/09/2021, 5:56 PM

## 2021-11-21 ENCOUNTER — Ambulatory Visit: Payer: Medicaid Other | Admitting: Family Medicine

## 2021-11-21 VITALS — BP 162/70 | HR 78 | Ht 62.0 in | Wt 337.5 lb

## 2021-11-21 DIAGNOSIS — I482 Chronic atrial fibrillation, unspecified: Secondary | ICD-10-CM

## 2021-11-21 DIAGNOSIS — J432 Centrilobular emphysema: Secondary | ICD-10-CM

## 2021-11-21 DIAGNOSIS — I5022 Chronic systolic (congestive) heart failure: Secondary | ICD-10-CM | POA: Diagnosis not present

## 2021-11-21 DIAGNOSIS — G4733 Obstructive sleep apnea (adult) (pediatric): Secondary | ICD-10-CM

## 2021-11-21 DIAGNOSIS — Z1231 Encounter for screening mammogram for malignant neoplasm of breast: Secondary | ICD-10-CM

## 2021-11-21 DIAGNOSIS — I890 Lymphedema, not elsewhere classified: Secondary | ICD-10-CM

## 2021-11-21 DIAGNOSIS — Z1211 Encounter for screening for malignant neoplasm of colon: Secondary | ICD-10-CM

## 2021-11-21 DIAGNOSIS — Z6841 Body Mass Index (BMI) 40.0 and over, adult: Secondary | ICD-10-CM

## 2021-11-21 MED ORDER — FUROSEMIDE 40 MG PO TABS: 80.0000 mg | ORAL_TABLET | Freq: Every day | ORAL | 3 refills | Status: DC

## 2021-11-21 MED ORDER — BREZTRI AEROSPHERE 160-9-4.8 MCG/ACT IN AERO: 2.0000 | INHALATION_SPRAY | Freq: Two times a day (BID) | RESPIRATORY_TRACT | 11 refills | Status: DC

## 2021-11-21 NOTE — Assessment & Plan Note (Signed)
Chronic problem Multifactorial aspects that impact this. Still with functional symptoms and dyspnea Not on controller Previously on Trelegy with improvement No longer w/ Pulm  Start Breztri 2 puff twice a day. If we need to change to Trelegy we can or refer to Pulm.

## 2021-11-21 NOTE — Assessment & Plan Note (Addendum)
Well controlled, chronic OSA on CPAP - Good adherence to CPAP nightly, 100% usage with improvement She uses AUTO machine - Continue current CPAP therapy, patient seems to be benefiting from therapy  Sign off on CMN order today, to be faxed

## 2021-11-21 NOTE — Patient Instructions (Addendum)
Thank you for coming to the office today.  Start Breztri 2 puff twice a day. If we need to change to Trelegy we can or refer to Pulm.  Re ordered Lasix furosemide 40mg x 2 = 80mg   CPAP orders will be signed today.  For Mammogram screening for breast cancer   Call the Imaging Center below anytime to schedule your own appointment now that order has been placed.  [facility] [address], [address] [facility] [place], [place] [address] Phone: [phone]  --------------   Ordered the Cologuard (home kit) test for colon cancer screening. Stay tuned for further updates.  It will be shipped to you directly. If not received in 2-4 weeks, call us or the company.   If you send it back and no results are received in 2-4 weeks, call us or the company as well!   Colon Cancer Screening: - For all adults age 50+ routine colon cancer screening is highly recommended.     - Recent guidelines from American [employer] Society recommend starting age of 45 - Early detection of colon cancer is important, because often there are no warning signs or symptoms, also if found early usually it can be cured. Late stage is hard to treat.   - If Cologuard is NEGATIVE, then it is good for 3 years before next due - If Cologuard is POSITIVE, then it is strongly advised to get a Colonoscopy, which allows the GI doctor to locate the source of the cancer or polyp (even very early stage) and treat it by removing it. ------------------------- Follow instructions to collect sample, you may call the company for any help or questions, 24/7 telephone support at [phone].    Please schedule a Follow-up Appointment to: Return in about 3 months (around 02/21/2022) for 3 month follow-up COPD, OSA, CHF.  If you have any other questions or concerns, please feel free to call the office or send a message through MyChart. You may also schedule an earlier appointment if  necessary.  Additionally, you may be receiving a survey about your experience at our office within a few days to 1 week by e-mail or mail. We value your feedback.  Alexander Karamalegos, DO [facility], [facility] 

## 2021-11-21 NOTE — Assessment & Plan Note (Signed)
Followed by Cardiology Will renew diuretic Furosemide 40mg  x 2 = 80mg  daily per previous dose. Continue Potassium

## 2021-11-21 NOTE — Progress Notes (Signed)
Subjective:    [name], female    DOB: April 27, 1960, 61 y.o.   MRN: [id number]  Katherine Scott is a 61 y.o. female presenting on 11/21/2021 for Sleep Apnea   HPI  OSA, on CPAP - Patient reports prior history of dx OSA and on CPAP, prior to treatment initial symptoms were snoring, daytime sleepiness and fatigue, has had several sleep studies in the past.   - Today reports that sleep apnea is well controlled. She uses the CPAP machine every night, virtually 100% usage except if power outage. Tolerates the machine well, and thinks that sleeps better with it and feels good. No new concerns or symptoms. No further issues with sleep apnea, or snoring. She feels less tired when uses it. Overall she benefits.  She is due to have CPAP machine/supplies re-ordered, requesting this order form be signed / faxed today.  No longer using oxygen regularly.  Chronic Systolic CHF Lymphedema She has followed with Cardiology Continues on Furosemide now 40mg  x 2 = 80mg  daily in AM Takes Potassium as well Needs new order on Furosemide  Centrilobular Emphysema She no longer is on maintenance inhaler. She was on Trelegy in past, previously referred to [facility] but no longer going to them. She requests restart maintenance inhaler.      08/06/2019    3:37 PM 03/31/2019   10:14 AM 02/14/2019    4:13 PM  Depression screen PHQ 2/9  Decreased Interest 0 0 0  Down, Depressed, Hopeless 0 0 0  PHQ - 2 Score 0 0 0    Social History   Tobacco Use   Smoking status: Never    Passive exposure: Yes   Smokeless tobacco: Never  Vaping Use   Vaping Use: Never used  Substance Use Topics   Alcohol use: No   Drug use: No    Review of Systems Per HPI unless specifically indicated above     Objective:    BP (!) 162/70   Pulse 78   Ht 5\' 2"  (1.575 m)   Wt (!) 337 lb 8 oz (153.1 kg)   SpO2 95%   BMI 61.73 kg/m   Wt Readings from Last 3 Encounters:  11/21/21 (!) 337 lb 8 oz  (153.1 kg)  09/19/21 (!) 341 lb (154.7 kg)  09/02/21 (!) 337 lb (152.9 kg)    Physical Exam Vitals and nursing note reviewed.  Constitutional:      General: She is not in acute distress.    Appearance: She is well-developed. She is obese. She is not diaphoretic.     Comments: Well-appearing, comfortable, cooperative  HENT:     Head: Normocephalic and atraumatic.  Eyes:     General:        Right eye: No discharge.        Left eye: No discharge.     Conjunctiva/sclera: Conjunctivae normal.  Neck:     Thyroid: No thyromegaly.  Cardiovascular:     Rate and Rhythm: Normal rate and regular rhythm.     Heart sounds: Normal heart sounds. No murmur heard. Pulmonary:     Effort: Pulmonary effort is normal. No respiratory distress.     Breath sounds: Wheezing present. No rales.  Musculoskeletal:        General: Normal range of motion.     Cervical back: Normal range of motion and neck supple.  Lymphadenopathy:     Cervical: No cervical adenopathy.  Skin:    General: Skin is warm  and dry.     Findings: No erythema or rash.  Neurological:     Mental Status: She is alert and oriented to person, place, and time.  Psychiatric:        Behavior: Behavior normal.     Comments: Well groomed, good eye contact, normal speech and thoughts    Results for orders placed or performed during the hospital encounter of 07/26/21  Uric acid  Result Value Ref Range   Uric Acid, Serum 11.7 (H) 2.5 - 7.1 mg/dL      Assessment & Plan:   Problem List Items Addressed This Visit     BMI 60.0-69.9, adult (HCC)   Centrilobular emphysema (HCC)    Chronic problem Multifactorial aspects that impact this. Still with functional symptoms and dyspnea Not on controller Previously on Trelegy with improvement No longer w/ Pulm  Start Breztri 2 puff twice a day. If we need to change to Trelegy we can or refer to Pulm.      Relevant Medications   BREZTRI AEROSPHERE 160-9-4.8 MCG/ACT AERO   Chronic atrial  fibrillation with RVR (HCC)   Relevant Medications   furosemide (LASIX) 40 MG tablet   Chronic systolic CHF (congestive heart failure) (HCC)    Followed by Cardiology Will renew diuretic Furosemide 40mg  x 2 = 80mg  daily per previous dose. Continue Potassium      Relevant Medications   furosemide (LASIX) 40 MG tablet   Lymphedema   OSA (obstructive sleep apnea) - Primary    Well controlled, chronic OSA on CPAP - Good adherence to CPAP nightly, 100% usage with improvement She uses AUTO machine - Continue current CPAP therapy, patient seems to be benefiting from therapy  Sign off on CMN order today, to be faxed      Other Visit Diagnoses     Colon cancer screening       Relevant Orders   Cologuard   Encounter for screening mammogram for malignant neoplasm of breast       Relevant Orders   MM 3D SCREEN BREAST BILATERAL         Ordered Mammogram.  Due for routine colon cancer screening. Never had colonoscopy (not interested), no family history colon cancer. - Discussion today about recommendations for either Colonoscopy or Cologuard screening, benefits and risks of screening, interested in Cologuard, understands that if positive then recommendation is for diagnostic colonoscopy to follow-up. - Ordered Cologuard today  Orders Placed This Encounter  Procedures   MM 3D SCREEN BREAST BILATERAL    Standing Status:   Future    Standing Expiration Date:   11/22/2022    Order Specific Question:   Reason for Exam (SYMPTOM  OR DIAGNOSIS REQUIRED)    Answer:   Screening bilateral 3D Mammogram Tom[facility]    Order Specific Question:   Preferred imaging location?    Answer:   [facility]   Cologuard     Meds ordered this encounter  Medications   furosemide (LASIX) 40 MG tablet    Sig: Take 2 tablets (80 mg total) by mouth daily.    Dispense:  180 tablet    Refill:  3   BREZTRI AEROSPHERE 160-9-4.8 MCG/ACT AERO    Sig: Inhale 2 puffs into the lungs 2 (two) times daily.     Dispense:  10.7 g    Refill:  11      Follow up plan: Return in about 3 months (around 02/21/2022) for 3 month follow-up COPD, OSA, CHF.   11/24/2022, DO  [facility] 11/21/2021, 11:10 AM

## 2021-11-24 ENCOUNTER — Other Ambulatory Visit: Payer: Self-pay

## 2021-11-24 ENCOUNTER — Telehealth: Payer: Self-pay | Admitting: Family Medicine

## 2021-11-24 DIAGNOSIS — J432 Centrilobular emphysema: Secondary | ICD-10-CM

## 2021-11-24 MED ORDER — BREZTRI AEROSPHERE 160-9-4.8 MCG/ACT IN AERO: 2.0000 | INHALATION_SPRAY | Freq: Two times a day (BID) | RESPIRATORY_TRACT | 11 refills | Status: DC

## 2021-11-24 NOTE — Telephone Encounter (Signed)
BREZTRI AEROSPHERE 160-9-4.8 MCG/ACT AERO Pt was prescribed this on 8/14 at appt Dr Kirtland Bouchard.  Pt states when she went for PU that pharmacy states as on just medicaid that would need PA and to call dr for assistance to facilate approval. PT # [phone] Call pt to advise/anxious  [id number] - [place], [place] - [address]  [address] [place] [address]  Phone: [phone] Fax: [phone]

## 2021-11-24 NOTE — Telephone Encounter (Signed)
Sent!

## 2022-01-25 IMAGING — DX DG CHEST 1V PORT
1 series · 1 of 1 positions shown · non-contrast
Comparison: 06/17/2019

CLINICAL DATA: Shortness of breath

EXAM:
PORTABLE CHEST 1 VIEW

[chest ap]
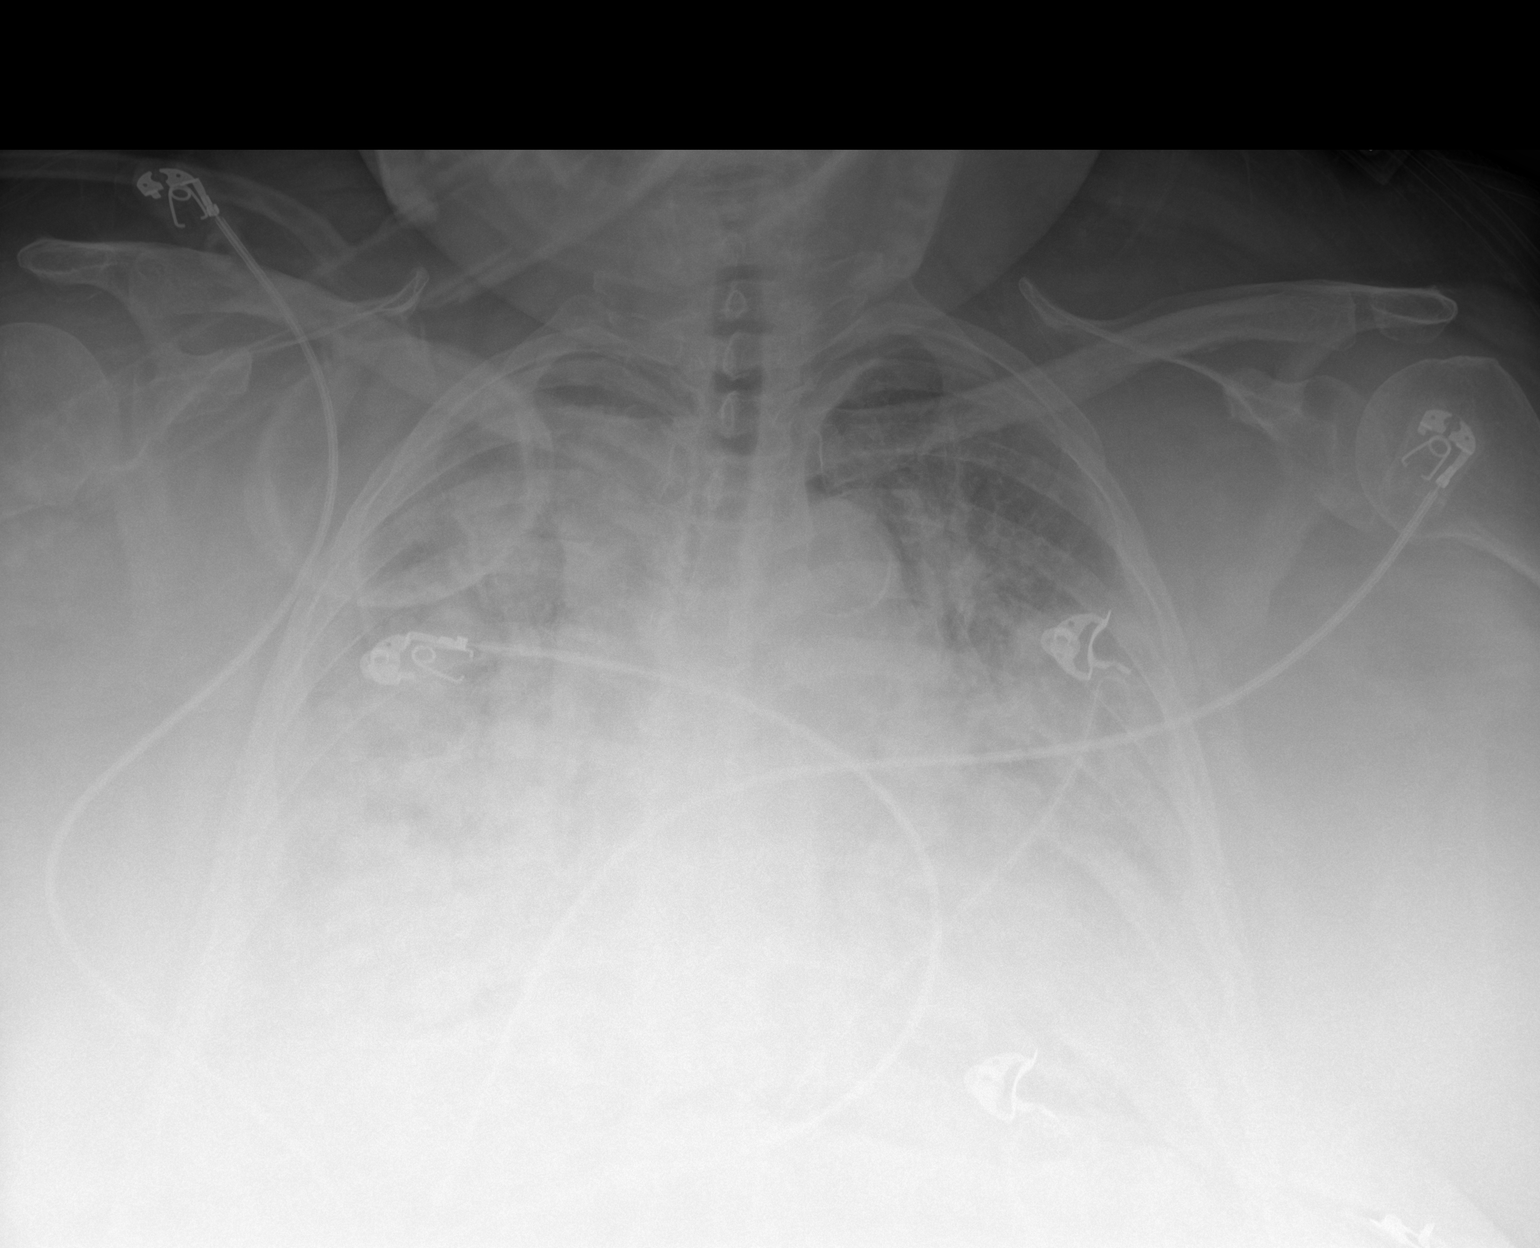

[1 of 1 positions shown; findings below may reference images not displayed]

FINDINGS: Bilateral pulmonary opacities. Possible pleural effusions.
Cardiomediastinal contours are obscured. There is cardiomegaly. No
pneumothorax.
IMPRESSION: Bilateral pulmonary opacities with cardiomegaly and possible pleural
effusions. Favored to reflect pulmonary edema although pneumonia is
not excluded.

## 2022-01-26 IMAGING — DX DG CHEST 1V PORT
1 series · 1 of 1 positions shown · non-contrast
Comparison: Yesterday

CLINICAL DATA: Acute on chronic respiratory failure

EXAM:
PORTABLE CHEST 1 VIEW

[chest ap]
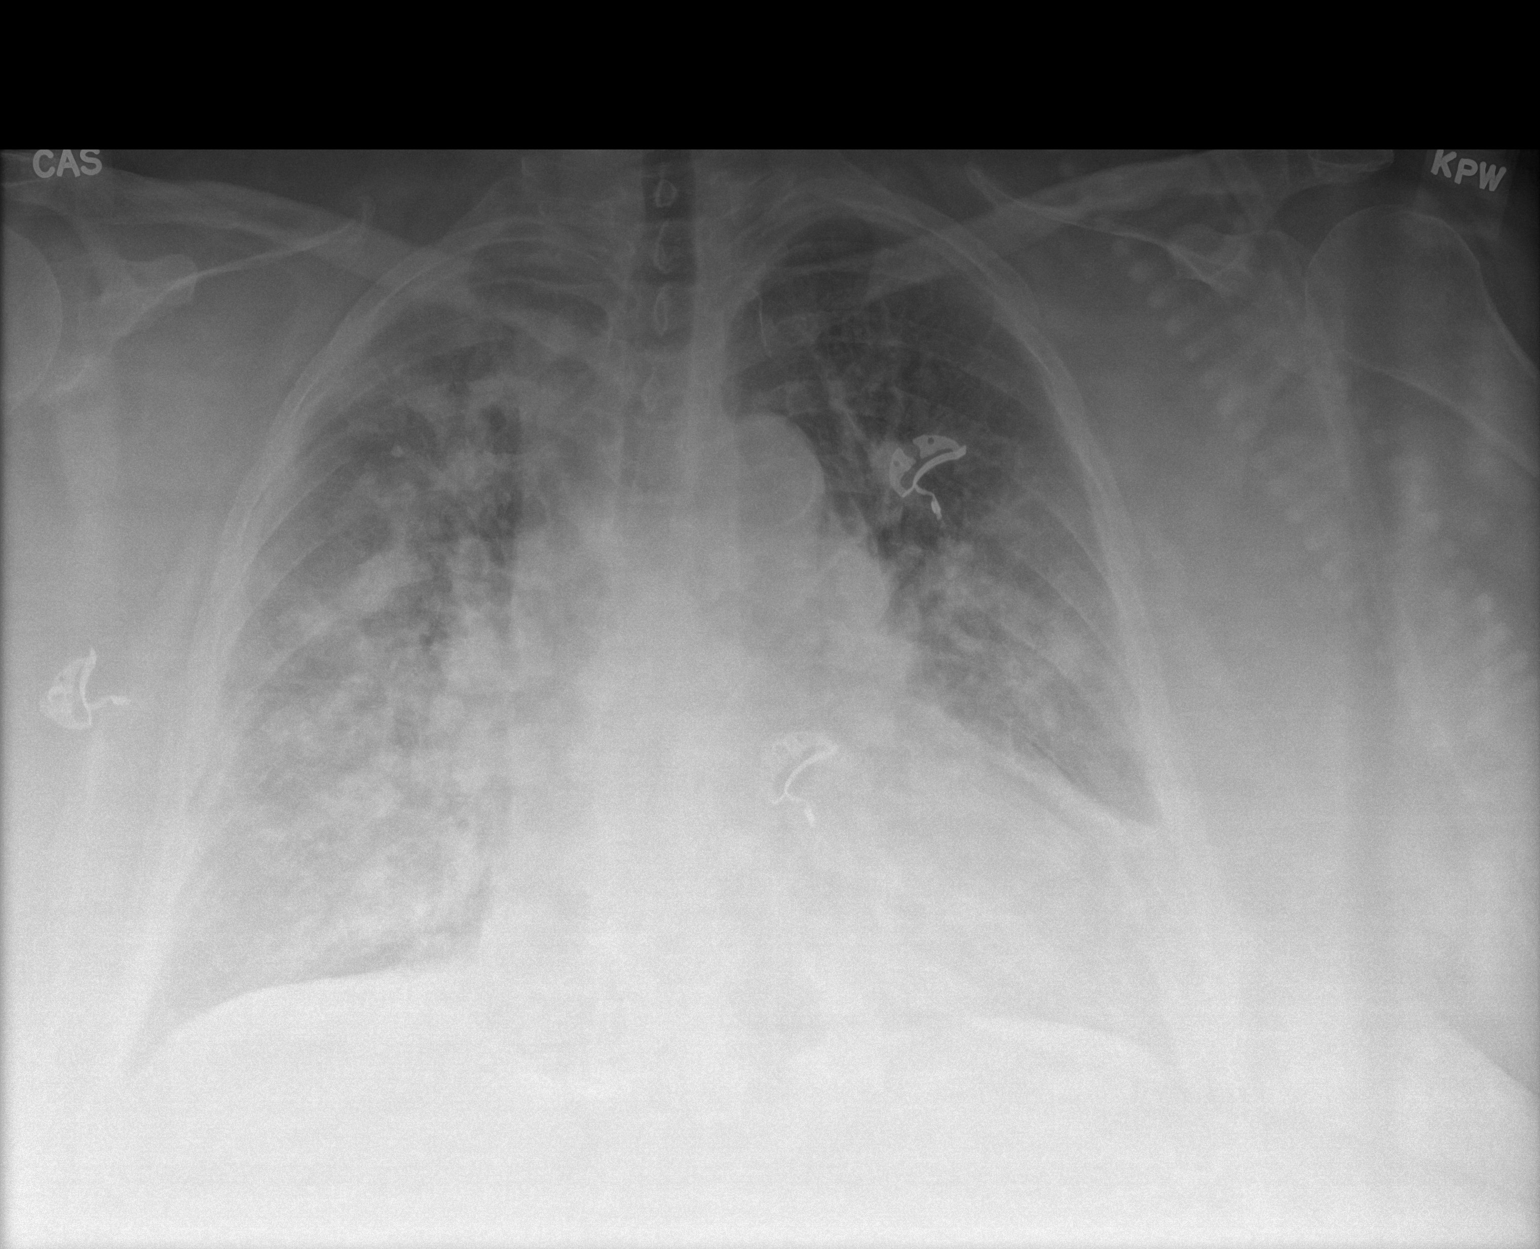

[1 of 1 positions shown; findings below may reference images not displayed]

FINDINGS: Cardiomegaly with edema and diffuse vascular enlargement. There has
been improvement since yesterday in the degree of pulmonary
opacification. No visible effusion or pneumothorax.
IMPRESSION: Improved CHF.

## 2022-01-26 IMAGING — US US EXTREM LOW VENOUS
1 series · 13 of 24 positions shown · non-contrast
Comparison: None.

CLINICAL DATA: Bilateral lower extremity pain and edema. History of
cellulitis affecting the lower legs the past several months.
Evaluate for DVT.



[Series 1: us venous img lower bilat (dvt) · portal-venous · 13 of 63 slices shown]
[im 1/63]
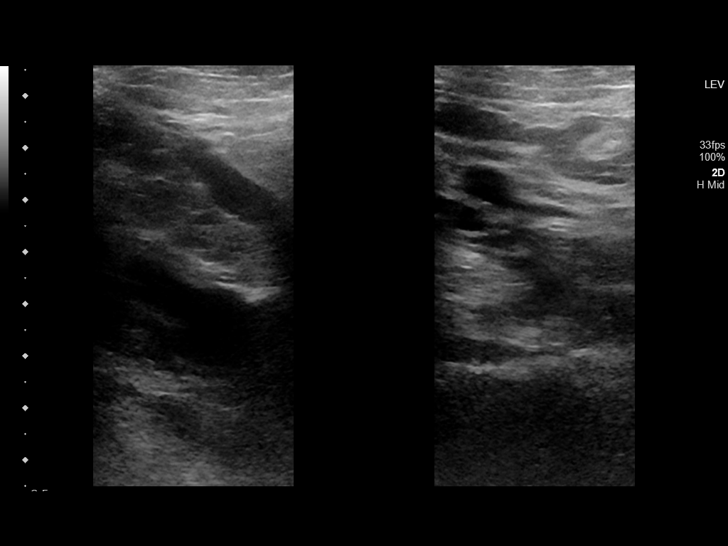
[im 6/63]
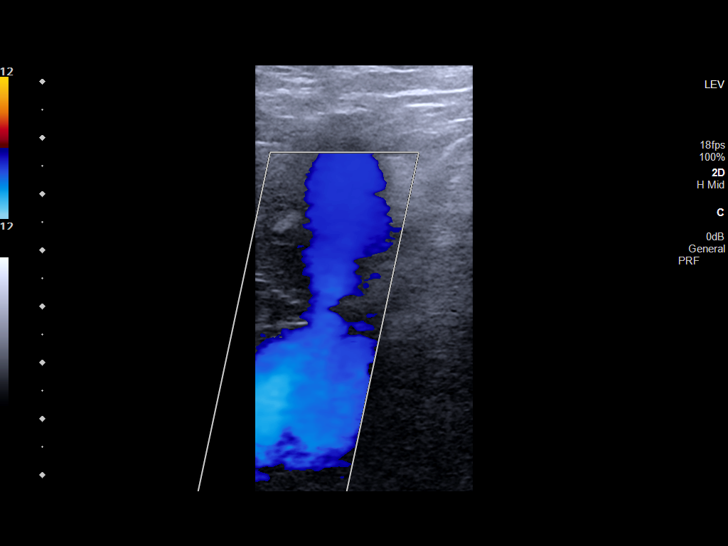
[im 11/63]
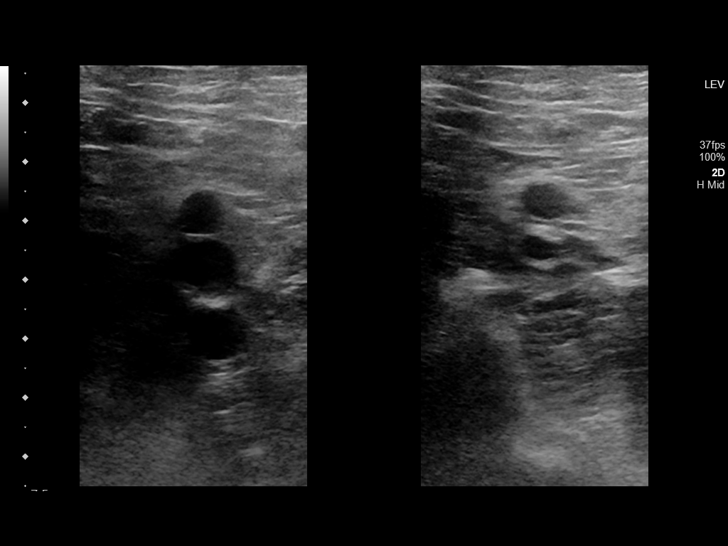
[im 17/63]
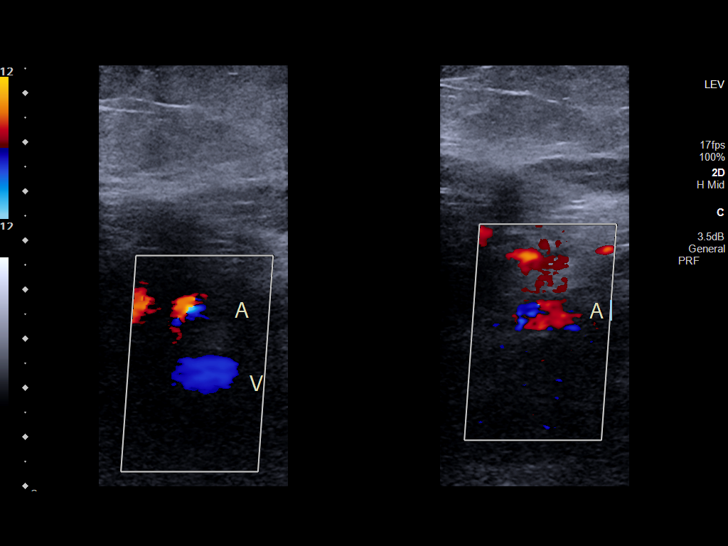
[im 22/63]
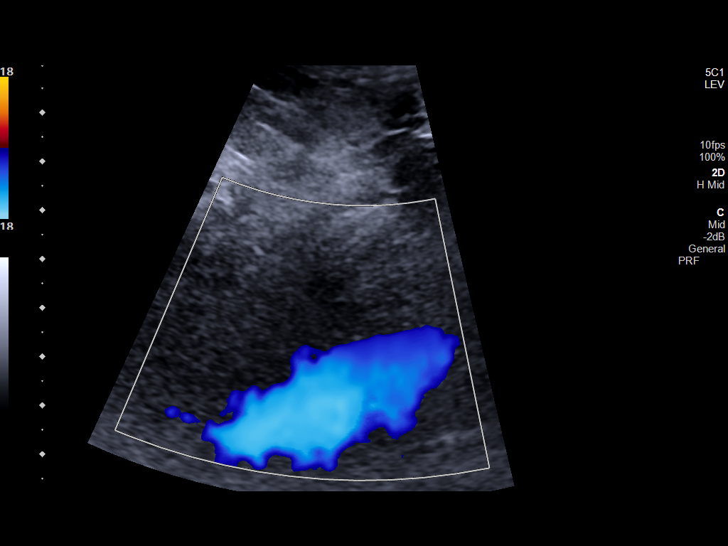
[im 27/63]
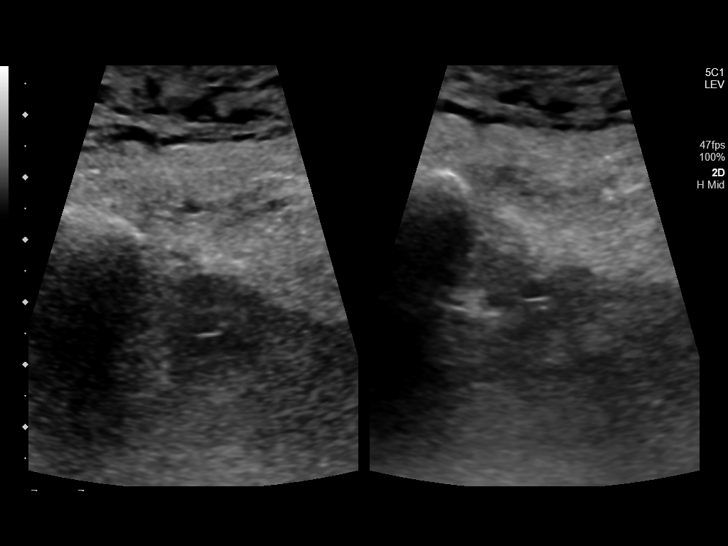
[im 33/63]
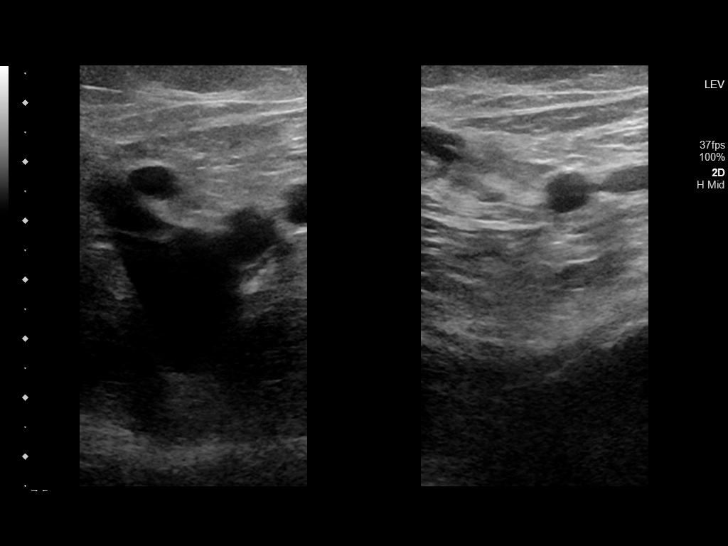
[im 36/63]
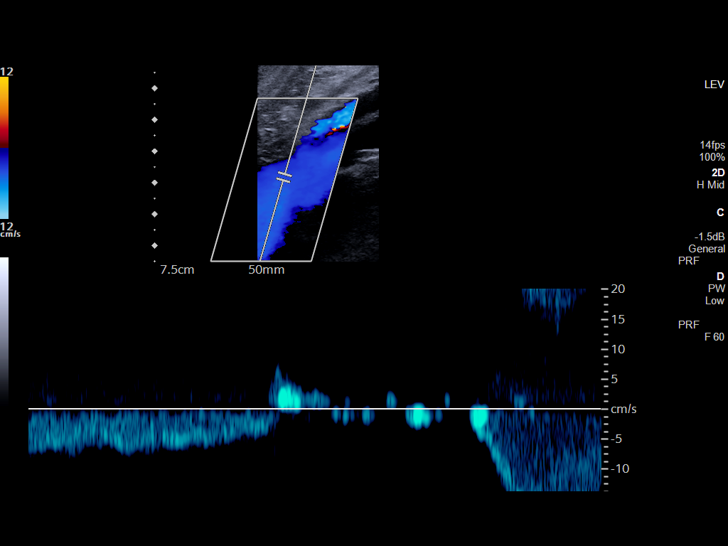
[im 41/63]
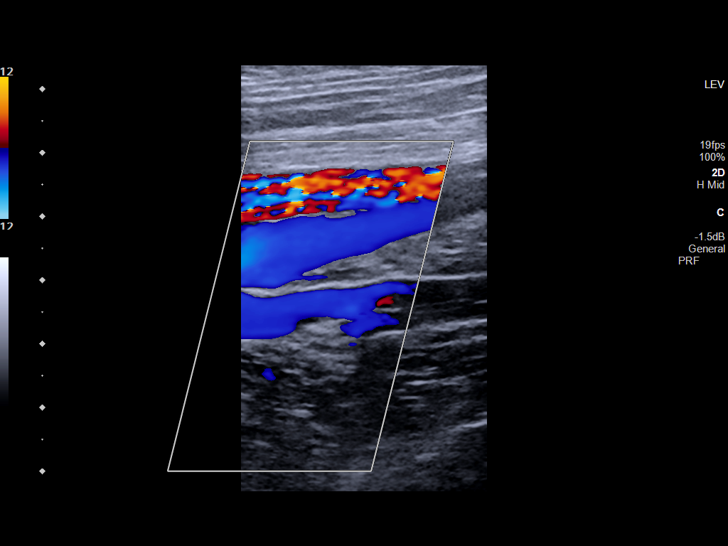
[im 46/63]
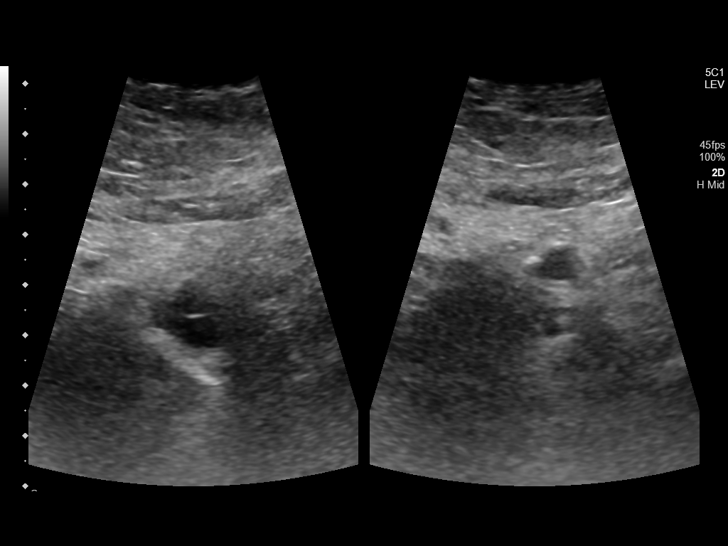
[im 52/63]
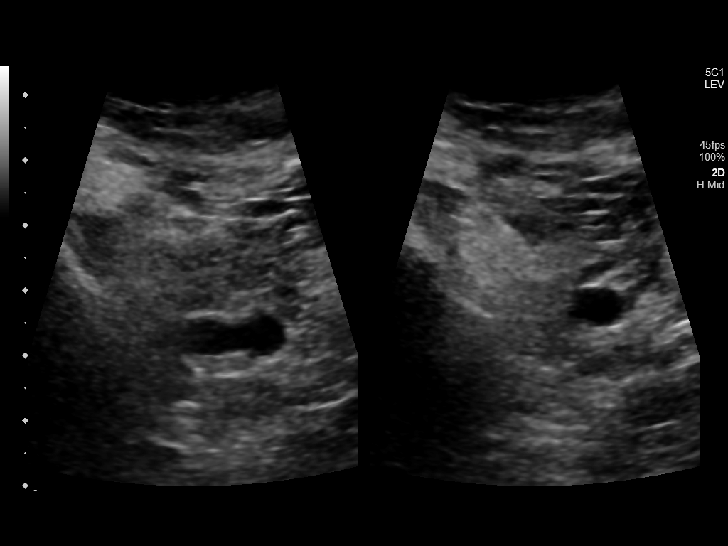
[im 57/63]
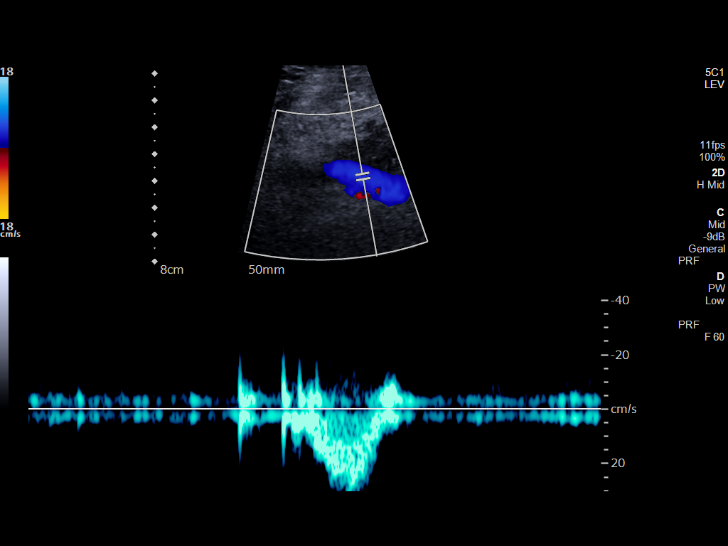
[im 63/63]
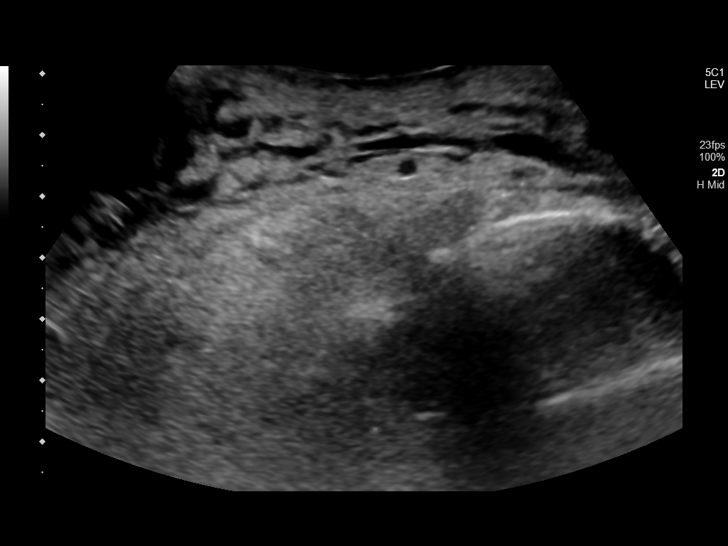

[13 of 24 positions shown; findings below may reference images not displayed]

FINDINGS: Examination is degraded due to patient body habitus and poor
sonographic window.

RIGHT LOWER EXTREMITY

Common Femoral Vein: No evidence of thrombus. Normal
compressibility, respiratory phasicity and response to augmentation.

Saphenofemoral Junction: No evidence of thrombus. Normal
compressibility and flow on color Doppler imaging.

Profunda Femoral Vein: No evidence of thrombus. Normal
compressibility and flow on color Doppler imaging.

Femoral Vein: No evidence of thrombus. Normal compressibility,
respiratory phasicity and response to augmentation.

Popliteal Vein: No evidence of thrombus. Normal compressibility,
respiratory phasicity and response to augmentation.

Calf Veins: No evidence of thrombus. Normal compressibility and flow
on color Doppler imaging.

Superficial Great Saphenous Vein: No evidence of thrombus. Normal
compressibility.

Venous Reflux:  None.

Other Findings: There is a minimal amount of subcutaneous edema the
level of the right calf (image 33).

LEFT LOWER EXTREMITY

Common Femoral Vein: No evidence of thrombus. Normal
compressibility, respiratory phasicity and response to augmentation.

Saphenofemoral Junction: No evidence of thrombus. Normal
compressibility and flow on color Doppler imaging.

Profunda Femoral Vein: No evidence of thrombus. Normal
compressibility and flow on color Doppler imaging.

Femoral Vein: No evidence of thrombus. Normal compressibility,
respiratory phasicity and response to augmentation.

Popliteal Vein: No evidence of thrombus. Normal compressibility,
respiratory phasicity and response to augmentation.

Calf Veins: No evidence of thrombus. Normal compressibility and flow
on color Doppler imaging.

Superficial Great Saphenous Vein: No evidence of thrombus. Normal
compressibility.

Venous Reflux:  None.

Other Findings: There is a minimal amount of subcutaneous edema at
the level of the left calf (image 64).
IMPRESSION: No evidence of DVT within either lower extremity.

## 2022-02-06 ENCOUNTER — Encounter (INDEPENDENT_AMBULATORY_CARE_PROVIDER_SITE_OTHER): Payer: Self-pay

## 2022-02-24 ENCOUNTER — Ambulatory Visit: Payer: Medicare Other | Admitting: Family Medicine

## 2022-03-24 ENCOUNTER — Ambulatory Visit: Payer: Medicaid Other | Admitting: Family Medicine

## 2022-04-05 ENCOUNTER — Ambulatory Visit (INDEPENDENT_AMBULATORY_CARE_PROVIDER_SITE_OTHER): Payer: Medicare Other | Admitting: Internal Medicine

## 2022-04-05 ENCOUNTER — Encounter: Payer: Self-pay | Admitting: Internal Medicine

## 2022-04-05 VITALS — BP 151/90 | HR 98 | Temp 98.4°F | Resp 20 | Ht 62.0 in | Wt 338.5 lb

## 2022-04-05 DIAGNOSIS — J00 Acute nasopharyngitis [common cold]: Secondary | ICD-10-CM | POA: Diagnosis not present

## 2022-04-05 MED ORDER — PROMETHAZINE-DM 6.25-15 MG/5ML PO SYRP: 5.0000 mL | ORAL_SOLUTION | Freq: Four times a day (QID) | ORAL | 0 refills | Status: DC | PRN

## 2022-04-05 MED ORDER — AZITHROMYCIN 250 MG PO TABS: ORAL_TABLET | ORAL | 0 refills | Status: DC

## 2022-04-05 NOTE — Progress Notes (Signed)
Subjective:    [name], female    DOB: Mar 10, 1961, 61 y.o.   MRN: [id number]  HPI  Pt presents to the clinic today with headache, sneezing, nasal congestion, cough and shortness of breath. This started 1 week ago.  The headache only occurs when she coughs. She is blowing colored mucous out of her nose. The cough is productive of colored mucous. She denies runny nose, ear pain, sore throat, nausea, vomiting or diarrhea. She denies fever, chills or body aches. She has tried Mucinex, Theraflu and Nyquil with minimal relief of symptoms. She has had sick contacts. She has a history of CHF and COPD managed on Furosemide, Breztri and Albuterol.  Review of Systems     Past Medical History:  Diagnosis Date   Abnormal mammogram, unspecified 2013   Atrial fibrillation ([place])    2013   Breast screening, unspecified    CHF (congestive heart failure) (HCC)    COPD (chronic obstructive pulmonary disease) ([place])    History of continuous positive airway pressure (CPAP) therapy at home    Hypothyroidism    Obesity, unspecified    Pneumonia    Screening for obesity    Sleep apnea    Special screening for malignant neoplasms, colon    Unspecified essential hypertension     Current Outpatient Medications  Medication Sig Dispense Refill   albuterol (VENTOLIN HFA) 108 (90 Base) MCG/ACT inhaler INHALE 2 PUFFS INTO THE LUNGS EVERY 6 (SIX) HOURS AS NEEDED FOR WHEEZING. 6.7 each 5   apixaban (ELIQUIS) 5 MG TABS tablet Take 1 tablet (5 mg total) by mouth 2 (two) times daily. 60 tablet 0   BREZTRI AEROSPHERE 160-9-4.8 MCG/ACT AERO Inhale 2 puffs into the lungs 2 (two) times daily. 10.7 g 11   diltiazem (DILACOR XR) 240 MG 24 hr capsule Take 240 mg by mouth daily.     furosemide (LASIX) 40 MG tablet Take 2 tablets (80 mg total) by mouth daily. 180 tablet 3   gabapentin (NEURONTIN) 100 MG capsule Take 1 capsule (100 mg total) by mouth 3 (three) times daily. 90 capsule 0   OXYGEN Inhale 2 L/L  into the lungs as needed.      potassium chloride SA (K-DUR,KLOR-CON) 20 MEQ tablet Take 1 tablet (20 mEq total) by mouth daily. (Patient taking differently: Take 20 mEq by mouth at bedtime.) 30 tablet 6   Spacer/Aero-Holding Chambers DEVI 1 Device by Does not apply route as needed. 1 each 0   traMADol (ULTRAM) 50 MG tablet Take 1 tablet (50 mg total) by mouth every 6 (six) hours as needed. 30 tablet 0   No current facility-administered medications for this visit.    Allergies  Allergen Reactions   Amiodarone     Caused toxicity     Family History  Problem Relation Age of Onset   Colon cancer Mother        diagnosis age 53's   Colon cancer Sister        diagnosis age 32   Atrial fibrillation Sister    Breast cancer Other        Maternal Grandmother; diagnosis age 53's   Lung cancer Other        Paternal Grandmother; diagnosis age 35's   Colon cancer Other        Paternal Grandfather; diagnosis age 60's   Heart disease Father     Social History   Socioeconomic History   Marital status: Divorced    Spouse name:  Not on file   Number of children: Not on file   Years of education: Not on file   Highest education level: Not on file  Occupational History   Not on file  Tobacco Use   Smoking status: Never    Passive exposure: Yes   Smokeless tobacco: Never  Vaping Use   Vaping Use: Never used  Substance and Sexual Activity   Alcohol use: No   Drug use: No   Sexual activity: Not on file  Other Topics Concern   Not on file  Social History Narrative   Not on file   Social Determinants of Health   Financial Resource Strain: Not on file  Food Insecurity: Not on file  Transportation Needs: Not on file  Physical Activity: Not on file  Stress: Not on file  Social Connections: Not on file  Intimate Partner Violence: Not on file     Constitutional: Pt reports headache. Denies fever, malaise, fatigue, or abrupt weight changes.  HEENT: Pt reports nasal congestion.  Denies eye pain, eye redness, ear pain, ringing in the ears, wax buildup, runny nose, bloody nose, or sore throat. Respiratory: Pt reports cough and shortness of breath. Denies difficulty breathing, shortness of breath, or sputum production.   Cardiovascular: Denies chest pain, chest tightness, palpitations or swelling in the hands or feet.  Gastrointestinal: Denies abdominal pain, bloating, constipation, diarrhea or blood in the stool.   No other specific complaints in a complete review of systems (except as listed in HPI above).  Objective:   Physical Exam   BP (!) 151/90 (BP Location: Right Arm, Patient Position: Sitting, Cuff Size: Large)   Pulse 98   Temp 98.4 F (36.9 C) (Oral)   Resp 20   Ht 5\' 2"  (1.575 m)   Wt (!) 338 lb 8 oz (153.5 kg)   SpO2 97%   BMI 61.91 kg/m   Wt Readings from Last 3 Encounters:  11/21/21 (!) 337 lb 8 oz (153.1 kg)  09/19/21 (!) 341 lb (154.7 kg)  09/02/21 (!) 337 lb (152.9 kg)    General: Appears her stated age, appears unwell, in NAD. Skin: Warm, dry and intact. No rashes noted. HEENT: Head: normal shape and size, no sinus tenderness noted; Eyes: sclera white, no icterus, conjunctiva pink, PERRLA and EOMs intact;  Neck:  No adenopathy noted. Cardiovascular: Normal rate and rhythm. S1,S2 noted.  No murmur, rubs or gallops noted.  Pulmonary/Chest: Normal effort and diminished breath sounds. No respiratory distress. No wheezes, rales or ronchi noted.  Neurological: Alert and oriented.   BMET    Component Value Date/Time   NA 135 07/21/2021 0550   NA 141 11/29/2011 0432   K 3.6 07/21/2021 0550   K 4.1 11/29/2011 0432   CL 95 (L) 07/21/2021 0550   CL 106 11/29/2011 0432   CO2 33 (H) 07/21/2021 0550   CO2 30 11/29/2011 0432   GLUCOSE 114 (H) 07/21/2021 0550   GLUCOSE 121 (H) 11/29/2011 0432   BUN 25 (H) 07/21/2021 0550   BUN 11 11/29/2011 0432   CREATININE 1.08 (H) 07/21/2021 0550   CREATININE 0.90 01/19/2017 0925   CALCIUM 8.9  07/21/2021 0550   CALCIUM 8.2 (L) 11/29/2011 0432   GFRNONAA 59 (L) 07/21/2021 0550   GFRNONAA >60 11/29/2011 0432   GFRAA >60 03/20/2019 1619   GFRAA >60 11/29/2011 0432    Lipid Panel     Component Value Date/Time   CHOL 177 07/16/2021 0633   TRIG 106 07/16/2021 09/15/2021  HDL 56 07/16/2021 0633   CHOLHDL 3.2 07/16/2021 0633   VLDL 21 07/16/2021 0633   LDLCALC 100 (H) 07/16/2021 0633    CBC    Component Value Date/Time   WBC 8.3 07/21/2021 0550   RBC 3.63 (L) 07/21/2021 0550   HGB 10.8 (L) 07/21/2021 0550   HGB 9.8 (L) 11/29/2011 0432   HCT 35.1 (L) 07/21/2021 0550   HCT 38.7 11/13/2011 1448   PLT 188 07/21/2021 0550   PLT 119 (L) 11/29/2011 0432   MCV 96.7 07/21/2021 0550   MCV 82 11/13/2011 1448   MCH 29.8 07/21/2021 0550   MCHC 30.8 07/21/2021 0550   RDW 13.3 07/21/2021 0550   RDW 14.0 11/13/2011 1448   LYMPHSABS 1.0 07/15/2021 0748   MONOABS 0.7 07/15/2021 0748   EOSABS 0.1 07/15/2021 0748   BASOSABS 0.1 07/15/2021 0748    Hgb A1C Lab Results  Component Value Date   HGBA1C 5.7 (H) 07/15/2021           Assessment & Plan:   Upper Respiratory Infection:  Encouraged rest and fluids She does not like to use nasal sprays A daily antihistamine may be helpful Rx for Azithromycin 250 mg daily x 5 days Rx for Promethazine DM cough syrup-sedation caution given  Follow up with your PCP as previously scheduled Webb Silversmith, NP

## 2022-04-05 NOTE — Patient Instructions (Signed)

## 2022-04-11 ENCOUNTER — Other Ambulatory Visit: Payer: Self-pay

## 2022-04-11 ENCOUNTER — Ambulatory Visit: Payer: Self-pay

## 2022-04-11 MED ORDER — PROMETHAZINE-DM 6.25-15 MG/5ML PO SYRP: 5.0000 mL | ORAL_SOLUTION | Freq: Four times a day (QID) | ORAL | 0 refills | Status: DC | PRN

## 2022-04-11 NOTE — Telephone Encounter (Signed)
Summary: Medication Request   Patient was seen in office on 04/05/22 and given promethazine-dextromethorphan (PROMETHAZINE-DM) 6.25-15 MG/5ML syrup for cough. Patient states that the medication was helping when she took it but is still coughing without it. Patient is requesting a refill.    Called pt - [id number]

## 2022-04-11 NOTE — Telephone Encounter (Signed)
Message from Shan Levans sent at 04/11/2022  1:37 PM EST  Summary: Medication Request   Patient was seen in office on 04/05/22 and given promethazine-dextromethorphan (PROMETHAZINE-DM) 6.25-15 MG/5ML syrup for cough. Patient states that the medication was helping when she took it but is still coughing without it. Patient is requesting a refill.         Chief Complaint: lingering cough Symptoms: same Frequency: n/a Pertinent Negatives: Patient denies fever Disposition: [] ED /[] Urgent Care (no appt availability in office) / [] Appointment(In office/virtual)/ []  [facility]/ [] Home Care/ [] Refused Recommended Disposition /[] [place] [facility]/ [x]  Follow-up with PCP Additional Notes: [facility] [address]  Reason for Disposition  Caller requesting a CONTROLLED substance prescription refill (e.g., narcotics, ADHD medicines)  Answer Assessment - Initial Assessment Questions 1. DRUG NAME: "What medicine do you need to have refilled?"     Promethazine_DM 2. REFILLS REMAINING: "How many refills are remaining?" (Note: The label on the medicine or pill bottle will show how many refills are remaining. If there are no refills remaining, then a renewal may be needed.)     none 3. EXPIRATION DATE: "What is the expiration date?" (Note: The label states when the prescription will expire, and thus can no longer be refilled.)     N/a 4. PRESCRIBING HCP: "Who prescribed it?" Reason: If prescribed by specialist, call should be referred to that group.     Dr. Raliegh Ip 5. SYMPTOMS: "Do you have any symptoms?"     Lingering cough, afebrile 6. PREGNANCY: "Is there any chance that you are pregnant?" "When was your last menstrual period?"     N/a  Protocols used: Medication Refill and Renewal Call-A-AH

## 2022-04-11 NOTE — Telephone Encounter (Signed)
Per call taken by agent, pt is requesting a refill of cough medicine.

## 2022-04-17 ENCOUNTER — Other Ambulatory Visit: Payer: Self-pay

## 2022-06-02 ENCOUNTER — Ambulatory Visit (INDEPENDENT_AMBULATORY_CARE_PROVIDER_SITE_OTHER): Payer: Medicare Other | Admitting: Family Medicine

## 2022-06-02 ENCOUNTER — Encounter: Payer: Self-pay | Admitting: Family Medicine

## 2022-06-02 VITALS — BP 130/86 | HR 73 | Ht 62.0 in | Wt 330.0 lb

## 2022-06-02 DIAGNOSIS — J0110 Acute frontal sinusitis, unspecified: Secondary | ICD-10-CM | POA: Diagnosis not present

## 2022-06-02 DIAGNOSIS — R0981 Nasal congestion: Secondary | ICD-10-CM | POA: Diagnosis not present

## 2022-06-02 DIAGNOSIS — J432 Centrilobular emphysema: Secondary | ICD-10-CM

## 2022-06-02 DIAGNOSIS — R051 Acute cough: Secondary | ICD-10-CM | POA: Diagnosis not present

## 2022-06-02 LAB — POC COVID19 BINAXNOW: SARS Coronavirus 2 Ag: NEGATIVE

## 2022-06-02 LAB — POC INFLUENZA A&B (BINAX/QUICKVUE)
Influenza A, POC: NEGATIVE
Influenza B, POC: NEGATIVE

## 2022-06-02 MED ORDER — AZITHROMYCIN 250 MG PO TABS: ORAL_TABLET | ORAL | 0 refills | Status: DC

## 2022-06-02 MED ORDER — PREDNISONE 20 MG PO TABS: ORAL_TABLET | ORAL | 0 refills | Status: DC

## 2022-06-02 MED ORDER — PROMETHAZINE-DM 6.25-15 MG/5ML PO SYRP: 5.0000 mL | ORAL_SOLUTION | Freq: Four times a day (QID) | ORAL | 0 refills | Status: DC | PRN

## 2022-06-02 NOTE — Patient Instructions (Addendum)
Thank you for coming to the office today.  Start Azithromycin Z pak (antibiotic) 2 tabs day 1, then 1 tab x 4 days, complete entire course even if improved  Add Prednisone if needed for breathing cough wheezing  Re order cough syrup  Keep on Theraflu and OTC med if need   Please schedule a Follow-up Appointment to: Return if symptoms worsen or fail to improve.  If you have any other questions or concerns, please feel free to call the office or send a message through [facility]. You may also schedule an earlier appointment if necessary.  Additionally, you may be receiving a survey about your experience at our office within a few days to 1 week by e-mail or mail. We value your feedback.  Nobie Putnam, DO [place]

## 2022-06-02 NOTE — Progress Notes (Signed)
Subjective:    [name], female    DOB: [birth year], 62 y.o.   MRN: [id number]  Katherine Scott is a 62 y.o. female presenting on 06/02/2022 for Cough and Nasal Congestion   HPI  Acute Sinusitis / Cough History COPD/ CHF Sunday Sick contact with grandson having "cold symptoms", his COVID She developed symptoms on Tuesday about 3 days ago. She took theraflu Previously can get worse with bronchitis related to her copd if not improved She has inhalers currently. Denies any current fever, wheezing, nausea vomiting      06/02/2022    8:35 AM 04/05/2022    9:21 AM 08/06/2019    3:37 PM  Depression screen PHQ 2/9  Decreased Interest 1 0 0  Down, Depressed, Hopeless 1 0 0  PHQ - 2 Score 2 0 0  Altered sleeping 1    Tired, decreased energy 1    Change in appetite 0    Feeling bad or failure about yourself  1    Trouble concentrating 0    Moving slowly or fidgety/restless 0    Suicidal thoughts 0    PHQ-9 Score 5    Difficult doing work/chores Somewhat difficult      Social History   Tobacco Use   Smoking status: Never    Passive exposure: Yes   Smokeless tobacco: Never  Vaping Use   Vaping Use: Never used  Substance Use Topics   Alcohol use: No   Drug use: No    Review of Systems Per HPI unless specifically indicated above     Objective:    BP 130/86   Pulse 73   Ht '5\' 2"'$  (1.575 m)   Wt (!) 330 lb (149.7 kg)   SpO2 98%   BMI 60.36 kg/m   Wt Readings from Last 3 Encounters:  06/02/22 (!) 330 lb (149.7 kg)  04/05/22 (!) 338 lb 8 oz (153.5 kg)  11/21/21 (!) 337 lb 8 oz (153.1 kg)    Physical Exam Vitals and nursing note reviewed.  Constitutional:      General: She is not in acute distress.    Appearance: She is well-developed. She is obese. She is not diaphoretic.     Comments: Well-appearing, comfortable, cooperative  HENT:     Head: Normocephalic and atraumatic.  Eyes:     General:        Right eye: No discharge.        Left eye: No  discharge.     Conjunctiva/sclera: Conjunctivae normal.  Neck:     Thyroid: No thyromegaly.  Cardiovascular:     Rate and Rhythm: Normal rate and regular rhythm.     Heart sounds: Normal heart sounds. No murmur heard. Pulmonary:     Effort: Pulmonary effort is normal. No respiratory distress.     Breath sounds: Normal breath sounds. No wheezing or rales.     Comments: cough Musculoskeletal:        General: Normal range of motion.     Cervical back: Normal range of motion and neck supple.  Lymphadenopathy:     Cervical: No cervical adenopathy.  Skin:    General: Skin is warm and dry.     Findings: No erythema or rash.  Neurological:     Mental Status: She is alert and oriented to person, place, and time.  Psychiatric:        Behavior: Behavior normal.     Comments: Well groomed, good eye contact, normal speech and  thoughts    Results for orders placed or performed in visit on 06/02/22  POC Influenza A&B (Binax test)  Result Value Ref Range   Influenza A, POC Negative Negative   Influenza B, POC Negative Negative  POC COVID-19  Result Value Ref Range   SARS Coronavirus 2 Ag Negative Negative      Assessment & Plan:   Problem List Items Addressed This Visit     Centrilobular emphysema (HCC)   Relevant Medications   promethazine-dextromethorphan (PROMETHAZINE-DM) 6.25-15 MG/5ML syrup   azithromycin (ZITHROMAX Z-PAK) 250 MG tablet   predniSONE (DELTASONE) 20 MG tablet   Other Visit Diagnoses     Acute non-recurrent frontal sinusitis    -  Primary   Relevant Medications   promethazine-dextromethorphan (PROMETHAZINE-DM) 6.25-15 MG/5ML syrup   azithromycin (ZITHROMAX Z-PAK) 250 MG tablet   predniSONE (DELTASONE) 20 MG tablet   Acute cough       Relevant Medications   promethazine-dextromethorphan (PROMETHAZINE-DM) 6.25-15 MG/5ML syrup   predniSONE (DELTASONE) 20 MG tablet   Other Relevant Orders   POC Influenza A&B (Binax test) (Completed)   POC COVID-19 (Completed)    Nasal congestion       Relevant Orders   POC Influenza A&B (Binax test) (Completed)   POC COVID-19 (Completed)       Acute URI Sinusitis vs Viral Afebrile now Negative COVID / Flu testing today POC No confirm sick contact  High risk patient with COPD / CHF  Pulse ox 98% No respiratory distress. No wheezing. Lungs clear.  Start Azithromycin Z pak (antibiotic) 2 tabs day 1, then 1 tab x 4 days, complete entire course even if improved  Add Prednisone if needed for breathing cough wheezing  Re order cough syrup  Keep on Theraflu and OTC med if need  Return if worsening, Chest X-ray. Seek care sooner if severe worse.  Meds ordered this encounter  Medications   promethazine-dextromethorphan (PROMETHAZINE-DM) 6.25-15 MG/5ML syrup    Sig: Take 5 mLs by mouth 4 (four) times daily as needed.    Dispense:  118 mL    Refill:  0   azithromycin (ZITHROMAX Z-PAK) 250 MG tablet    Sig: Take 2 tabs ('500mg'$  total) on Day 1. Take 1 tab ('250mg'$ ) daily for next 4 days.    Dispense:  6 tablet    Refill:  0   predniSONE (DELTASONE) 20 MG tablet    Sig: Take daily with food. Start with '60mg'$  (3 pills) x 2 days, then reduce to '40mg'$  (2 pills) x 2 days, then '20mg'$  (1 pill) x 3 days    Dispense:  13 tablet    Refill:  0      Follow up plan: Return if symptoms worsen or fail to improve.   Nobie Putnam, [facility] 06/02/2022, 8:48 AM

## 2022-07-25 ENCOUNTER — Encounter (INDEPENDENT_AMBULATORY_CARE_PROVIDER_SITE_OTHER): Payer: Self-pay | Admitting: Nurse Practitioner

## 2022-07-25 ENCOUNTER — Ambulatory Visit (INDEPENDENT_AMBULATORY_CARE_PROVIDER_SITE_OTHER): Payer: Medicare HMO | Admitting: Nurse Practitioner

## 2022-07-25 VITALS — BP 169/80 | HR 72 | Resp 18 | Wt 338.4 lb

## 2022-07-25 DIAGNOSIS — I2609 Other pulmonary embolism with acute cor pulmonale: Secondary | ICD-10-CM

## 2022-07-25 DIAGNOSIS — I10 Essential (primary) hypertension: Secondary | ICD-10-CM

## 2022-07-25 DIAGNOSIS — I890 Lymphedema, not elsewhere classified: Secondary | ICD-10-CM | POA: Diagnosis not present

## 2022-07-25 NOTE — Progress Notes (Signed)
Subjective:    [name], female    DOB: [birth year], 62 y.o.   MRN: [id number] Chief Complaint  Patient presents with   Follow-up    1 yr follow up    Katherine Scott is a 62 year old female who returns today for evaluation of her lower extremity lymphedema.  In 2021 the patient had multiple recurrent episodes of weeping, ulceration and cellulitis.  That has since improved drastically and she has not had any true significant issues over the last year.  She did have an episode of cellulitis once which was treated by her primary care physician.  Otherwise she has been doing well.  Last year the patient had a pulmonary embolism and underwent thrombectomy.  She notes that the breathing is improved but not completely without issue due to other underlying medical issues present prior to the pulmonary embolism.  She currently denies cellulitis, venous ulcerations or any other significant lower extremity difficulties.  She notes that she does elevate her lower extremities is much as possible in addition to the use of medical grade compression stockings.  The patient does not have a lymphedema pump.    Review of Systems  Cardiovascular:  Positive for leg swelling.  Musculoskeletal:  Positive for arthralgias.  All other systems reviewed and are negative.      Objective:   Physical Exam Vitals reviewed.  HENT:     Head: Normocephalic.  Cardiovascular:     Rate and Rhythm: Normal rate.     Pulses: Normal pulses.  Pulmonary:     Effort: Pulmonary effort is normal.  Musculoskeletal:     Right lower leg: Edema present.     Left lower leg: Edema present.  Skin:    General: Skin is warm and dry.  Neurological:     Mental Status: She is alert and oriented to person, place, and time.  Psychiatric:        Mood and Affect: Mood normal.        Behavior: Behavior normal.        Thought Content: Thought content normal.        Judgment: Judgment normal.     BP (!) 169/80 (BP Location:  Right Arm)   Pulse 72   Resp 18   Wt (!) 338 lb 6.4 oz (153.5 kg)   BMI 61.89 kg/m   Past Medical History:  Diagnosis Date   Abnormal mammogram, unspecified 2013   Atrial fibrillation    2013   Breast screening, unspecified    CHF (congestive heart failure)    COPD (chronic obstructive pulmonary disease)    History of continuous positive airway pressure (CPAP) therapy at home    Hypothyroidism    Obesity, unspecified    Pneumonia    Screening for obesity    Sleep apnea    Special screening for malignant neoplasms, colon    Unspecified essential hypertension     Social History   Socioeconomic History   Marital status: Divorced    Spouse name: Not on file   Number of children: Not on file   Years of education: Not on file   Highest education level: Not on file  Occupational History   Not on file  Tobacco Use   Smoking status: Never    Passive exposure: Yes   Smokeless tobacco: Never  Vaping Use   Vaping Use: Never used  Substance and Sexual Activity   Alcohol use: No   Drug use: No   Sexual activity:  Not on file  Other Topics Concern   Not on file  Social History Narrative   Not on file   Social Determinants of Health   Financial Resource Strain: Not on file  Food Insecurity: Not on file  Transportation Needs: Not on file  Physical Activity: Not on file  Stress: Not on file  Social Connections: Not on file  Intimate Partner Violence: Not on file    Past Surgical History:  Procedure Laterality Date   ABDOMINAL HYSTERECTOMY  2011   ANTERIOR CRUCIATE LIGAMENT REPAIR  2003   BREAST BIOPSY Left January 22, 2012   left breast stereotactic biopsy showed fibroadenomatous changes with microcalcifications, fat necrosis, and sclerosing adenosis and columnar cell changes. No evidence of atypia or malignancy.   CARDIOVERSION N/A 09/22/2016   Procedure: CARDIOVERSION;  Surgeon: Dalia Heading, MD;  Location: [facility];  Service: Cardiovascular;  Laterality: N/A;    CARDIOVERSION N/A 01/17/2018   Procedure: CARDIOVERSION;  Surgeon: Dalia Heading, MD;  Location: [facility];  Service: Cardiovascular;  Laterality: N/A;   CARDIOVERSION N/A 01/09/2019   Procedure: CARDIOVERSION;  Surgeon: Dalia Heading, MD;  Location: [facility];  Service: Cardiovascular;  Laterality: N/A;   CESAREAN SECTION  1985, 1987, and 1998   COLONOSCOPY  2011   Dr. Mechele Collin   ELECTROPHYSIOLOGIC STUDY N/A 12/03/2014   Procedure: Cardioversion;  Surgeon: Dalia Heading, MD;  Location: [facility];  Service: Cardiovascular;  Laterality: N/A;   ELECTROPHYSIOLOGIC STUDY N/A 04/01/2015   Procedure: Cardioversion;  Surgeon: Dalia Heading, MD;  Location: [facility];  Service: Cardiovascular;  Laterality: N/A;   KNEE SURGERY  2013   PULMONARY THROMBECTOMY Bilateral 07/15/2021   Procedure: PULMONARY THROMBECTOMY;  Surgeon: Annice Needy, MD;  Location: [facility];  Service: Cardiovascular;  Laterality: Bilateral;    Family History  Problem Relation Age of Onset   Colon cancer Mother        diagnosis age 24's   Colon cancer Sister        diagnosis age 41   Atrial fibrillation Sister    Breast cancer Other        Maternal Grandmother; diagnosis age 49's   Lung cancer Other        Paternal Grandmother; diagnosis age 48's   Colon cancer Other        Paternal Grandfather; diagnosis age 53's   Heart disease Father     Allergies  Allergen Reactions   Amiodarone     Caused toxicity        Latest Ref Rng & Units 07/21/2021    5:50 AM 07/20/2021    5:08 AM 07/19/2021    5:40 AM  CBC  WBC 4.0 - 10.5 K/uL 8.3  9.0  7.0   Hemoglobin 12.0 - 15.0 g/dL 40.9  81.1  91.4   Hematocrit 36.0 - 46.0 % 35.1  35.8  35.5   Platelets 150 - 400 K/uL 188  160  153       CMP     Component Value Date/Time   NA 135 07/21/2021 0550   NA 141 11/29/2011 0432   K 3.6 07/21/2021 0550   K 4.1 11/29/2011 0432   CL 95 (L) 07/21/2021 0550   CL 106 11/29/2011 0432   CO2 33 (H) 07/21/2021 0550   CO2 30  11/29/2011 0432   GLUCOSE 114 (H) 07/21/2021 0550   GLUCOSE 121 (H) 11/29/2011 0432   BUN 25 (H) 07/21/2021 0550   BUN 11 11/29/2011  0432   CREATININE 1.08 (H) 07/21/2021 0550   CREATININE 0.90 01/19/2017 0925   CALCIUM 8.9 07/21/2021 0550   CALCIUM 8.2 (L) 11/29/2011 0432   PROT 7.8 07/15/2021 0748   ALBUMIN 3.7 07/15/2021 0748   AST 16 07/15/2021 0748   ALT 16 07/15/2021 0748   ALKPHOS 68 07/15/2021 0748   BILITOT 1.1 07/15/2021 0748   GFRNONAA 59 (L) 07/21/2021 0550   GFRNONAA >60 11/29/2011 0432   GFRAA >60 03/20/2019 1619   GFRAA >60 11/29/2011 0432     No results found.     Assessment & Plan:   1. Lymphedema The patient is doing relatively well today.  She has not had any significant issues related to her lymphedema for about the last year besides the episode of cellulitis.  The patient is advised to continue with use of her compression addition to elevation.  The patient will also try to be active and exercise with the pool this summer as it has been previously largely helpful to her.  We discussed a lymphedema pump but currently the patient symptoms are doing well without this so we will not proceed with a lymphedema pump at this time.  Will plan to have the patient return in 1 year unless she begins to develop any issues sooner.  2. Primary hypertension Continue antihypertensive medications as already ordered, these medications have been reviewed and there are no changes at this time.  3. Other acute pulmonary embolism with acute cor pulmonale The patient notes that her breathing has improved but she does have some underlying breathing issues prior to her pulmonary embolism including her atrial fibrillation and systolic heart failure.  No further intervention recommended at this time patient is advised to continue with Eliquis.   Current Outpatient Medications on File Prior to Visit  Medication Sig Dispense Refill   albuterol (VENTOLIN HFA) 108 (90 Base) MCG/ACT  inhaler INHALE 2 PUFFS INTO THE LUNGS EVERY 6 (SIX) HOURS AS NEEDED FOR WHEEZING. 6.7 each 5   apixaban (ELIQUIS) 5 MG TABS tablet Take 1 tablet (5 mg total) by mouth 2 (two) times daily. 60 tablet 0   diltiazem (DILACOR XR) 240 MG 24 hr capsule Take 240 mg by mouth daily.     furosemide (LASIX) 40 MG tablet Take 2 tablets (80 mg total) by mouth daily. 180 tablet 3   potassium chloride SA (K-DUR,KLOR-CON) 20 MEQ tablet Take 1 tablet (20 mEq total) by mouth daily. 30 tablet 6   Spacer/Aero-Holding Chambers DEVI 1 Device by Does not apply route as needed. 1 each 0   azithromycin (ZITHROMAX Z-PAK) 250 MG tablet Take 2 tabs (500mg  total) on Day 1. Take 1 tab (250mg ) daily for next 4 days. (Patient not taking: Reported on 07/25/2022) 6 tablet 0   BREZTRI AEROSPHERE 160-9-4.8 MCG/ACT AERO Inhale 2 puffs into the lungs 2 (two) times daily. (Patient not taking: Reported on 04/05/2022) 10.7 g 11   dextromethorphan-guaiFENesin (MUCINEX DM) 30-600 MG 12hr tablet Take 1 tablet by mouth 2 (two) times daily. (Patient not taking: Reported on 07/25/2022)     OXYGEN Inhale 2 L/L into the lungs as needed.  (Patient not taking: Reported on 04/05/2022)     predniSONE (DELTASONE) 20 MG tablet Take daily with food. Start with 60mg  (3 pills) x 2 days, then reduce to 40mg  (2 pills) x 2 days, then 20mg  (1 pill) x 3 days (Patient not taking: Reported on 07/25/2022) 13 tablet 0   promethazine-dextromethorphan (PROMETHAZINE-DM) 6.25-15 MG/5ML syrup Take 5 mLs  by mouth 4 (four) times daily as needed. (Patient not taking: Reported on 07/25/2022) 118 mL 0   traMADol (ULTRAM) 50 MG tablet Take 1 tablet (50 mg total) by mouth every 6 (six) hours as needed. (Patient not taking: Reported on 04/05/2022) 30 tablet 0   No current facility-administered medications on file prior to visit.    There are no Patient Instructions on file for this visit. No follow-ups on file.   Georgiana Spinner, NP

## 2022-07-27 ENCOUNTER — Ambulatory Visit (INDEPENDENT_AMBULATORY_CARE_PROVIDER_SITE_OTHER): Payer: Self-pay | Admitting: Nurse Practitioner

## 2022-08-22 ENCOUNTER — Ambulatory Visit: Payer: Medicare Other | Admitting: Family Medicine

## 2022-08-25 ENCOUNTER — Encounter: Payer: Self-pay | Admitting: Family Medicine

## 2022-08-25 ENCOUNTER — Ambulatory Visit (INDEPENDENT_AMBULATORY_CARE_PROVIDER_SITE_OTHER): Payer: Medicare HMO | Admitting: Family Medicine

## 2022-08-25 VITALS — BP 130/82 | HR 82 | Resp 18 | Ht 62.0 in | Wt 341.0 lb

## 2022-08-25 DIAGNOSIS — J432 Centrilobular emphysema: Secondary | ICD-10-CM | POA: Diagnosis not present

## 2022-08-25 DIAGNOSIS — L03119 Cellulitis of unspecified part of limb: Secondary | ICD-10-CM | POA: Diagnosis not present

## 2022-08-25 DIAGNOSIS — I890 Lymphedema, not elsewhere classified: Secondary | ICD-10-CM

## 2022-08-25 MED ORDER — BREZTRI AEROSPHERE 160-9-4.8 MCG/ACT IN AERO: 2.0000 | INHALATION_SPRAY | Freq: Two times a day (BID) | RESPIRATORY_TRACT | 11 refills | Status: DC

## 2022-08-25 MED ORDER — CEPHALEXIN 500 MG PO CAPS
500.0000 mg | ORAL_CAPSULE | Freq: Three times a day (TID) | ORAL | 0 refills | Status: DC
Start: 2022-08-25 — End: 2022-10-14

## 2022-08-25 MED ORDER — SULFAMETHOXAZOLE-TRIMETHOPRIM 800-160 MG PO TABS
1.0000 | ORAL_TABLET | Freq: Two times a day (BID) | ORAL | 0 refills | Status: AC
Start: 2022-08-25 — End: 2022-09-04

## 2022-08-25 MED ORDER — POTASSIUM CHLORIDE CRYS ER 20 MEQ PO TBCR
20.0000 meq | EXTENDED_RELEASE_TABLET | Freq: Every day | ORAL | 3 refills | Status: AC
Start: 2022-08-25 — End: ?

## 2022-08-25 NOTE — Progress Notes (Signed)
Subjective:    [name], female    DOB: 1960-09-09, 62 y.o.   MRN: [id number]  Katherine Scott is a 62 y.o. female presenting on 08/25/2022 for Cellulitis  (Left leg for 2 weeks )   HPI  Chronic Systolic CHF Lymphedema She has followed with Cardiology Continues on Furosemide now 40mg  x 2 = 80mg  daily in AM Takes Potassium as well Needs new order on Potassium  Cellulitis lower extremity, flare up Stasis Dermatitis  Prior history with similar flares has required vascular unna boots in past Improved w/ antibiotic dual approach Keflex + Bactrim  Centrilobular Emphysema Previously on Breztri with success but ran out of sample and rx was not covered. Now has new insurance She requests restart maintenance inhaler. On Albuterol regularly only      08/25/2022    2:39 PM 06/02/2022    8:35 AM 04/05/2022    9:21 AM  Depression screen PHQ 2/9  Decreased Interest 0 1 0  Down, Depressed, Hopeless 0 1 0  PHQ - 2 Score 0 2 0  Altered sleeping 2 1   Tired, decreased energy 2 1   Change in appetite 2 0   Feeling bad or failure about yourself  0 1   Trouble concentrating 0 0   Moving slowly or fidgety/restless 0 0   Suicidal thoughts 0 0   PHQ-9 Score 6 5   Difficult doing work/chores Not difficult at all Somewhat difficult     Social History   Tobacco Use   Smoking status: Never    Passive exposure: Yes   Smokeless tobacco: Never  Vaping Use   Vaping Use: Never used  Substance Use Topics   Alcohol use: No   Drug use: No    Review of Systems Per HPI unless specifically indicated above     Objective:    BP 130/82 (BP Location: Right Arm, Patient Position: Sitting, Cuff Size: Large)   Pulse 82   Resp 18   Ht 5\' 2"  (1.575 m)   Wt (!) 341 lb (154.7 kg)   SpO2 96%   BMI 62.37 kg/m   Wt Readings from Last 3 Encounters:  08/25/22 (!) 341 lb (154.7 kg)  07/25/22 (!) 338 lb 6.4 oz (153.5 kg)  06/02/22 (!) 330 lb (149.7 kg)    Physical Exam Vitals and  nursing note reviewed.  Constitutional:      General: She is not in acute distress.    Appearance: Normal appearance. She is well-developed. She is obese. She is not diaphoretic.     Comments: Well-appearing, comfortable, cooperative  HENT:     Head: Normocephalic and atraumatic.  Eyes:     General:        Right eye: No discharge.        Left eye: No discharge.     Conjunctiva/sclera: Conjunctivae normal.  Cardiovascular:     Rate and Rhythm: Normal rate.  Pulmonary:     Effort: Pulmonary effort is normal.     Breath sounds: Wheezing present. No rhonchi or rales.  Musculoskeletal:     Right lower leg: Edema present.     Left lower leg: Edema (L>R, stasis dermatitis with some inc warmth and pitting edema, no ulceration or oozing) present.  Skin:    General: Skin is warm and dry.     Findings: Erythema present. No rash.  Neurological:     Mental Status: She is alert and oriented to person, place, and time.  Psychiatric:  Mood and Affect: Mood normal.        Behavior: Behavior normal.        Thought Content: Thought content normal.     Comments: Well groomed, good eye contact, normal speech and thoughts      Results for orders placed or performed in visit on 06/02/22  POC Influenza A&B (Binax test)  Result Value Ref Range   Influenza A, POC Negative Negative   Influenza B, POC Negative Negative  POC COVID-19  Result Value Ref Range   SARS Coronavirus 2 Ag Negative Negative      Assessment & Plan:   Problem List Items Addressed This Visit     Centrilobular emphysema (HCC)   Relevant Medications   BREZTRI AEROSPHERE 160-9-4.8 MCG/ACT AERO   Lymphedema   Relevant Medications   potassium chloride SA (KLOR-CON M) 20 MEQ tablet   Recurrent cellulitis of lower extremity - Primary   Relevant Medications   cephALEXin (KEFLEX) 500 MG capsule   sulfamethoxazole-trimethoprim (BACTRIM DS) 800-160 MG tablet     Clinically with bilateral symmetrical worsening over days  to week+ cellulitis / lymphedema vs stasis dermatitis   Similar history of prior recurrent cellulitis and lymphedema.  Previously followed by [facility] has upcoming apt   Plan - Advised based on her prior treatment course we can treat similarly at this time given severity of this condition, will offer dual antibiotic coverage - Start taking Bactrim TWICE A DAY x 10 days - Start Keflex THREE TIMES A DAY x 10 days - Use compression and RICE therapy for edema /lymphedema - Continue Furosemide - refill K supplement   Lastly advised if cellulitis / lymphedema remains, she may need to seek care at hospital due to oral antibiotic may not be effective for too much swelling  Meds ordered this encounter  Medications   BREZTRI AEROSPHERE 160-9-4.8 MCG/ACT AERO    Sig: Inhale 2 puffs into the lungs 2 (two) times daily.    Dispense:  10.7 g    Refill:  11   potassium chloride SA (KLOR-CON M) 20 MEQ tablet    Sig: Take 1 tablet (20 mEq total) by mouth daily.    Dispense:  90 tablet    Refill:  3   cephALEXin (KEFLEX) 500 MG capsule    Sig: Take 1 capsule (500 mg total) by mouth 3 (three) times daily.    Dispense:  30 capsule    Refill:  0   sulfamethoxazole-trimethoprim (BACTRIM DS) 800-160 MG tablet    Sig: Take 1 tablet by mouth 2 (two) times daily for 10 days.    Dispense:  20 tablet    Refill:  0      Follow up plan: Return in about 3 months (around 11/25/2022) for 3 month folllow-up AM apt, Edema, COPD, updates (Lab after).   Saralyn Pilar, DO [facility] 08/25/2022, 2:55 PM

## 2022-08-25 NOTE — Patient Instructions (Addendum)
Thank you for coming to the office today.  2 antibiotics Bactrim 2 times per day Keflex 3 times per day For 10 day course  Keep with compression elevation  Re order Potassium daily  Breztri sample inhalers x 2 - each one is 2 puff twice per day, for 7 days, for 2 week free supply  Ordered med to the pharmacy if not heard back within 2 weeks, let me know.  Please schedule a Follow-up Appointment to: Return in about 3 months (around 11/25/2022) for 3 month folllow-up AM apt, Edema, COPD, updates (Lab after).  If you have any other questions or concerns, please feel free to call the office or send a message through MyChart. You may also schedule an earlier appointment if necessary.  Additionally, you may be receiving a survey about your experience at our office within a few days to 1 week by e-mail or mail. We value your feedback.  Saralyn Pilar, DO [facility], [place]

## 2022-09-11 ENCOUNTER — Other Ambulatory Visit: Payer: Self-pay

## 2022-09-25 ENCOUNTER — Ambulatory Visit: Payer: Medicare HMO | Admitting: Family Medicine

## 2022-09-29 ENCOUNTER — Emergency Department
Admission: EM | Admit: 2022-09-29 | Discharge: 2022-09-29 | Disposition: A | Discharge status: Home / Self Care | Payer: Medicare HMO | Attending: Student in an Organized Health Care Education/Training Program | Admitting: Student in an Organized Health Care Education/Training Program

## 2022-09-29 ENCOUNTER — Other Ambulatory Visit: Payer: Self-pay

## 2022-09-29 DIAGNOSIS — S6992XA Unspecified injury of left wrist, hand and finger(s), initial encounter: Secondary | ICD-10-CM | POA: Diagnosis present

## 2022-09-29 DIAGNOSIS — Y92009 Unspecified place in unspecified non-institutional (private) residence as the place of occurrence of the external cause: Secondary | ICD-10-CM | POA: Diagnosis not present

## 2022-09-29 DIAGNOSIS — S61012A Laceration without foreign body of left thumb without damage to nail, initial encounter: Secondary | ICD-10-CM | POA: Diagnosis not present

## 2022-09-29 DIAGNOSIS — W268XXA Contact with other sharp object(s), not elsewhere classified, initial encounter: Secondary | ICD-10-CM | POA: Insufficient documentation

## 2022-09-29 MED ORDER — CEPHALEXIN 500 MG PO CAPS: 500.0000 mg | ORAL_CAPSULE | Freq: Two times a day (BID) | ORAL | 0 refills | Status: AC

## 2022-09-29 MED ORDER — BACITRACIN ZINC 500 UNIT/GM EX OINT
TOPICAL_OINTMENT | Freq: Once | CUTANEOUS | Status: AC
  Filled 2022-09-29: qty 0.9

## 2022-09-29 NOTE — ED Provider Notes (Signed)
[facility] Provider Note    Event Date/Time   First MD Initiated Contact with Patient 09/29/22 1340     (approximate)   History   Laceration   HPI  Katherine Scott is a 62 y.o. female presents to the ER for evaluation of injury to the left thumb that occurred prior about 10 days ago after she cut it on a food blender.  Has been bandaging it at home.      Physical Exam   Triage Vital Signs: ED Triage Vitals  Enc Vitals Group     BP 09/29/22 1258 (!) 177/101     Pulse Rate 09/29/22 1258 (!) 107     Resp 09/29/22 1258 (!) 24     Temp 09/29/22 1258 99.1 F (37.3 C)     Temp Source 09/29/22 1258 Oral     SpO2 09/29/22 1258 94 %     Weight --      Height --      Head Circumference --      Peak Flow --      Pain Score 09/29/22 1259 0     Pain Loc --      Pain Edu? --      Excl. in GC? --     Most recent vital signs: Vitals:   09/29/22 1258  BP: (!) 177/101  Pulse: (!) 107  Resp: (!) 24  Temp: 99.1 F (37.3 C)  SpO2: 94%     Constitutional: Alert  Eyes: Conjunctivae are normal.  Head: Atraumatic. Nose: No congestion/rhinnorhea. Mouth/Throat: Mucous membranes are moist.   Neck: Painless ROM.  Cardiovascular:   Good peripheral circulation. Respiratory: Normal respiratory effort.  No retractions.  Gastrointestinal: Soft and nontender.  Musculoskeletal:  no deformity.  Appropriately healing 1 cm laceration of the tip of her left thumb.  Does not involve the nail.  No purulence no surrounding cellulitic changes.  No foreign body. Neurologic:  MAE spontaneously. No gross focal neurologic deficits are appreciated.  Skin:  Skin is warm, dry and intact. No rash noted. Psychiatric: Mood and affect are normal. Speech and behavior are normal.    ED Results / Procedures / Treatments   Labs (all labs ordered are listed, but only abnormal results are displayed) Labs Reviewed - No data to  display   EKG     RADIOLOGY    PROCEDURES:  Critical Care performed:   Procedures   MEDICATIONS ORDERED IN ED: Medications  bacitracin ointment (has no administration in time range)     IMPRESSION / MDM / ASSESSMENT AND PLAN / ED COURSE  I reviewed the triage vital signs and the nursing notes.                              Differential diagnosis includes, but is not limited to, wound dehiscence, laceration, cellulitis, abscess  Patient presented to the ER for evaluation of wound as described above.  Patient well-appearing no acute distress.  Appropriately healing granulation tissue.  Small amount of redness.  Due to delayed healing and small mount of redness discussed option for antibiotics versus topical antibiotics.  Will provide prescription for antibiotics.  Discussed wound care.  Discussed signs and symptoms for which she should return to the ER.  Patient agreeable to plan.       FINAL CLINICAL IMPRESSION(S) / ED DIAGNOSES   Final diagnoses:  Laceration of left thumb without damage to nail, foreign  body presence unspecified, initial encounter     Rx / DC Orders   ED Discharge Orders          Ordered    cephALEXin (KEFLEX) 500 MG capsule  2 times daily        09/29/22 1407             Note:  This document was prepared using Dragon voice recognition software and may include unintentional dictation errors.    Willy Eddy, MD 09/29/22 [phone]

## 2022-09-29 NOTE — ED Notes (Signed)
Pt. Discharge prior to RN assessment. 

## 2022-09-29 NOTE — ED Triage Notes (Addendum)
Pt cut left thumb 1 week ago on a kitchen-aid mixer and states it's not healing well and is bleeding, pt is on eliquis. Pt is AOX4, NAD noted. Last tetanus shot <10 years. Cut is not bleeding at this time, mild swelling noted, no discharge noted. Wound wrapped in non-adherent dressing and gauze wrap.

## 2022-10-14 ENCOUNTER — Ambulatory Visit
Admission: EM | Admit: 2022-10-14 | Discharge: 2022-10-14 | Disposition: A | Discharge status: Home / Self Care | Payer: Medicare HMO | Attending: Physician Assistant | Admitting: Physician Assistant

## 2022-10-14 ENCOUNTER — Ambulatory Visit (INDEPENDENT_AMBULATORY_CARE_PROVIDER_SITE_OTHER): Payer: Medicare HMO

## 2022-10-14 DIAGNOSIS — J441 Chronic obstructive pulmonary disease with (acute) exacerbation: Secondary | ICD-10-CM

## 2022-10-14 DIAGNOSIS — R059 Cough, unspecified: Secondary | ICD-10-CM | POA: Diagnosis not present

## 2022-10-14 DIAGNOSIS — I5022 Chronic systolic (congestive) heart failure: Secondary | ICD-10-CM | POA: Insufficient documentation

## 2022-10-14 DIAGNOSIS — I110 Hypertensive heart disease with heart failure: Secondary | ICD-10-CM | POA: Insufficient documentation

## 2022-10-14 DIAGNOSIS — R0602 Shortness of breath: Secondary | ICD-10-CM | POA: Diagnosis not present

## 2022-10-14 DIAGNOSIS — R051 Acute cough: Secondary | ICD-10-CM | POA: Insufficient documentation

## 2022-10-14 DIAGNOSIS — Z86711 Personal history of pulmonary embolism: Secondary | ICD-10-CM | POA: Insufficient documentation

## 2022-10-14 DIAGNOSIS — R0989 Other specified symptoms and signs involving the circulatory and respiratory systems: Secondary | ICD-10-CM | POA: Diagnosis not present

## 2022-10-14 DIAGNOSIS — Z1152 Encounter for screening for COVID-19: Secondary | ICD-10-CM | POA: Insufficient documentation

## 2022-10-14 LAB — SARS CORONAVIRUS 2 BY RT PCR: SARS Coronavirus 2 by RT PCR: NEGATIVE

## 2022-10-14 MED ORDER — IPRATROPIUM-ALBUTEROL 0.5-2.5 (3) MG/3ML IN SOLN
3.0000 mL | Freq: Once | RESPIRATORY_TRACT | Status: AC
  Administered 2022-10-14: 3 mL via RESPIRATORY_TRACT

## 2022-10-14 MED ORDER — PREDNISONE 20 MG PO TABS: 40.0000 mg | ORAL_TABLET | Freq: Every day | ORAL | 0 refills | Status: AC

## 2022-10-14 MED ORDER — AZITHROMYCIN 250 MG PO TABS: 250.0000 mg | ORAL_TABLET | Freq: Every day | ORAL | 0 refills | Status: DC

## 2022-10-14 MED ORDER — PROMETHAZINE-DM 6.25-15 MG/5ML PO SYRP: 5.0000 mL | ORAL_SOLUTION | Freq: Four times a day (QID) | ORAL | 0 refills | Status: DC | PRN

## 2022-10-14 NOTE — Discharge Instructions (Signed)
-  Negative COVID you do not have pneumonia. - I sent medications to the pharmacy to treat exacerbation of your COPD.  Use breathing treatments at home and inhalers as needed. - Return for any worsening symptoms.

## 2022-10-14 NOTE — ED Triage Notes (Signed)
Patient having productive cough, chest congestion, SOB onset 3 days ago. No known sick exposure. Patient has history of COPD and has the air conditioner blowing on her at home.

## 2022-10-14 NOTE — ED Provider Notes (Signed)
[facility]    CSN: [id number] Arrival date & time: 10/14/22  1158      History   Chief Complaint Chief Complaint  Patient presents with   Cough   Shortness of Breath    HPI BRIEONA Scott is a 62 y.o. female with history of COPD, CHF, sleep apnea and previous PE.  Patient presents today for productive cough of yellowish sputum, shortness of breath, fatigue and nasal congestion x 3 days.  Reports breathing is a little worse than her baseline.  She reports she is going out of town tomorrow and thought she might need treatment.  She denies any sick contacts or known exposure to COVID.  Has been using inhalers at home.  Does not have a nebulizer.  HPI  Past Medical History:  Diagnosis Date   Abnormal mammogram, unspecified 2013   Atrial fibrillation (HCC)    2013   Breast screening, unspecified    CHF (congestive heart failure) (HCC)    COPD (chronic obstructive pulmonary disease) (HCC)    History of continuous positive airway pressure (CPAP) therapy at home    Hypothyroidism    Obesity, unspecified    Pneumonia    Screening for obesity    Sleep apnea    Special screening for malignant neoplasms, colon    Unspecified essential hypertension     Patient Active Problem List   Diagnosis Date Noted   Centrilobular emphysema (HCC) 11/21/2021   OSA (obstructive sleep apnea) 07/16/2021   Pulmonary embolism with acute cor pulmonale (HCC) 07/15/2021   Chronic atrial fibrillation with RVR (HCC) 07/15/2021   Chronic systolic CHF (congestive heart failure) (HCC) 07/15/2021   Elevated troponin 07/15/2021   HTN (hypertension) 07/15/2021   Closed fracture of proximal end of left humerus 04/15/2020   Shortness of breath 02/12/2020   Venous ulcer (HCC) 10/09/2019   Lymphedema 02/26/2017   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 No evidence of atypia or malignancy.   CARDIOVERSION N/A 09/22/2016   Procedure: CARDIOVERSION;  Surgeon: Dalia Heading, MD;  Location: [facility];  Service: Cardiovascular;  Laterality: N/A;   CARDIOVERSION N/A 01/17/2018   Procedure: CARDIOVERSION;  Surgeon: Dalia Heading, MD;  Location: [facility];  Service: Cardiovascular;  Laterality: N/A;   CARDIOVERSION N/A 01/09/2019   Procedure: CARDIOVERSION;  Surgeon: Dalia Heading, MD;  Location: [facility];  Service: Cardiovascular;  Laterality: N/A;   CESAREAN SECTION  1985, 1987, and 1998   COLONOSCOPY  2011   Dr. Mechele Collin   ELECTROPHYSIOLOGIC STUDY N/A 12/03/2014   Procedure: Cardioversion;  Surgeon: Dalia Heading, MD;  Location: [facility];  Service: Cardiovascular;  Laterality: N/A;   ELECTROPHYSIOLOGIC STUDY N/A 04/01/2015   Procedure: Cardioversion;  Surgeon: Dalia Heading, MD;  Location: [facility];  Service: Cardiovascular;  Laterality: N/A;   KNEE SURGERY  2013   PULMONARY THROMBECTOMY Bilateral 07/15/2021   Procedure: PULMONARY THROMBECTOMY;  Surgeon: Annice Needy, MD;  Location: [facility];  Service: Cardiovascular;  Laterality: Bilateral;    OB History     Gravida  3  Para  3   Term      Preterm      AB      Living  3      SAB  0   IAB  0   Ectopic      Multiple      Live Births           Obstetric Comments  Age first menstrual cycle 40 Age first pregnancy 24  Last menstrual cycle 2011- due to hysterectomy          Home Medications    Prior to Admission medications   Medication Sig Start Date End Date Taking? Authorizing Provider  apixaban (ELIQUIS) 5 MG TABS tablet Take  1 tablet (5 mg total) by mouth 2 (two) times daily. 07/25/21  Yes Marrion Coy, MD  azithromycin (ZITHROMAX) 250 MG tablet Take 1 tablet (250 mg total) by mouth daily. Take first 2 tablets together, then 1 every day until finished. 10/14/22  Yes Eusebio Friendly B, PA-C  BREZTRI AEROSPHERE 160-9-4.8 MCG/ACT AERO Inhale 2 puffs into the lungs 2 (two) times daily. 08/25/22  Yes Karamalegos, Netta Neat, DO  diltiazem (DILACOR XR) 240 MG 24 hr capsule Take 240 mg by mouth daily.   Yes [provider]  furosemide (LASIX) 40 MG tablet Take 2 tablets (80 mg total) by mouth daily. 11/21/21  Yes Karamalegos, Netta Neat, DO  potassium chloride SA (KLOR-CON M) 20 MEQ tablet Take 1 tablet (20 mEq total) by mouth daily. 08/25/22  Yes Karamalegos, Netta Neat, DO  predniSONE (DELTASONE) 20 MG tablet Take 2 tablets (40 mg total) by mouth daily for 5 days. 10/14/22 10/19/22 Yes Shirlee Latch, PA-C  promethazine-dextromethorphan (PROMETHAZINE-DM) 6.25-15 MG/5ML syrup Take 5 mLs by mouth 4 (four) times daily as needed. 10/14/22  Yes Shirlee Latch, PA-C  Spacer/Aero-Holding Chambers DEVI 1 Device by Does not apply route as needed. 05/24/18  Yes Galen Manila, NP  traMADol (ULTRAM) 50 MG tablet Take 1 tablet (50 mg total) by mouth every 6 (six) hours as needed. 09/02/21  Yes Georgiana Spinner, NP  albuterol (VENTOLIN HFA) 108 (90 Base) MCG/ACT inhaler INHALE 2 PUFFS INTO THE LUNGS EVERY 6 (SIX) HOURS AS NEEDED FOR WHEEZING. 03/17/20   Hunsucker, Lesia Sago, MD    Family History Family History  Problem Relation Age of Onset   Colon cancer Mother        diagnosis age 38's   Colon cancer Sister        diagnosis age 37   Atrial fibrillation Sister    Breast cancer Other        Maternal Grandmother; diagnosis age 80's   Lung cancer Other        Paternal Grandmother; diagnosis age 34's   Colon cancer Other        Paternal Grandfather; diagnosis age 70's   Heart disease Father     Social History Social  History   Tobacco Use   Smoking status: Never    Passive exposure: Yes   Smokeless tobacco: Never  Vaping Use   Vaping Use: Never used  Substance Use Topics   Alcohol use: No   Drug use: No     Allergies   Amiodarone   Review of Systems Review of Systems  Constitutional:  Positive for fatigue. Negative for chills, diaphoresis and fever.  HENT:  Positive for congestion. Negative for ear pain, rhinorrhea, sinus pressure, sinus pain and sore throat.   Respiratory:  Positive for cough,  chest tightness and shortness of breath.   Cardiovascular:  Negative for chest pain.  Gastrointestinal:  Negative for abdominal pain, nausea and vomiting.  Musculoskeletal:  Negative for arthralgias and myalgias.  Skin:  Negative for rash.  Neurological:  Negative for weakness and headaches.  Hematological:  Negative for adenopathy.     Physical Exam Triage Vital Signs ED Triage Vitals  Enc Vitals Group     BP      Pulse      Resp      Temp      Temp src      SpO2      Weight      Height      Head Circumference      Peak Flow      Pain Score      Pain Loc      Pain Edu?      Excl. in GC?    No data found.  Updated Vital Signs BP (!) 159/93 (BP Location: Left Arm)   Pulse 82   Temp 98.3 F (36.8 C) (Oral)   Resp 20   Ht 5\' 3"  (1.6 m)   Wt (!) 330 lb (149.7 kg)   SpO2 99%   BMI 58.46 kg/m       Physical Exam Vitals and nursing note reviewed.  Constitutional:      General: She is not in acute distress.    Appearance: Normal appearance. She is obese. She is not ill-appearing or toxic-appearing.  HENT:     Head: Normocephalic and atraumatic.     Nose: Congestion present.     Mouth/Throat:     Mouth: Mucous membranes are moist.     Pharynx: Oropharynx is clear.  Eyes:     General: No scleral icterus.       Right eye: No discharge.        Left eye: No discharge.     Conjunctiva/sclera: Conjunctivae normal.  Cardiovascular:     Rate and Rhythm: Normal rate and  regular rhythm.     Heart sounds: Normal heart sounds.  Pulmonary:     Effort: Pulmonary effort is normal. No respiratory distress.     Breath sounds: Rhonchi (scattered throughout with reduced overall breath sounds) present.  Musculoskeletal:     Cervical back: Neck supple.  Skin:    General: Skin is dry.  Neurological:     General: No focal deficit present.     Mental Status: She is alert. Mental status is at baseline.     Motor: No weakness.     Gait: Gait normal.  Psychiatric:        Mood and Affect: Mood normal.        Behavior: Behavior normal.        Thought Content: Thought content normal.      UC Treatments / Results  Labs (all labs ordered are listed, but only abnormal results are displayed) Labs Reviewed  SARS CORONAVIRUS 2 BY RT PCR    EKG   Radiology DG Chest 2 View  Result Date: 10/14/2022 CLINICAL DATA:  Cough and chest congestion EXAM: CHEST - 2 VIEW COMPARISON:  Chest x-ray dated July 15, 2021 FINDINGS: The heart size and mediastinal contours are within normal limits. Interval resolution of central predominant consolidations when compared with 2023 prior. Both lungs are clear. The visualized skeletal structures are unremarkable. IMPRESSION: No acute cardiopulmonary abnormality. Electronically Signed   By: Allegra Lai M.D.   On: 10/14/2022 14:47  Procedures Procedures (including critical care time)  Medications Ordered in UC Medications  ipratropium-albuterol (DUONEB) 0.5-2.5 (3) MG/3ML nebulizer solution 3 mL (3 mLs Nebulization Given 10/14/22 1444)    Initial Impression / Assessment and Plan / UC Course  I have reviewed the triage vital signs and the nursing notes.  Pertinent labs & imaging results that were available during my care of the patient were reviewed by me and considered in my medical decision making (see chart for details).   62 year old female presents for productive cough, congestion, shortness of breath.  She has a history of  COPD.  Cough currently productive of yellowish sputum.  No fever.  PCR COVID test negative.  X-ray performed to assess for pneumonia.  Patient given duoneb.  She will do that this helped her symptoms.  She says she will use her family's nebulizer and solution at home to perform nebulizer treatments if needed.  COPD exacerbation. Treating with prednisone, azithromycin and Promethazine DM.  She asked to be treated the same as she was treated in February by her primary care provider.  Supportive care advised.  Reviewed return precautions.   Final Clinical Impressions(s) / UC Diagnoses   Final diagnoses:  COPD exacerbation (HCC)  Acute cough  Shortness of breath     Discharge Instructions      -Negative COVID you do not have pneumonia. - I sent medications to the pharmacy to treat exacerbation of your COPD.  Use breathing treatments at home and inhalers as needed. - Return for any worsening symptoms.     ED Prescriptions     Medication Sig Dispense Auth. Provider   azithromycin (ZITHROMAX) 250 MG tablet Take 1 tablet (250 mg total) by mouth daily. Take first 2 tablets together, then 1 every day until finished. 6 tablet Eusebio Friendly B, PA-C   predniSONE (DELTASONE) 20 MG tablet Take 2 tablets (40 mg total) by mouth daily for 5 days. 10 tablet Shirlee Latch, PA-C   promethazine-dextromethorphan (PROMETHAZINE-DM) 6.25-15 MG/5ML syrup Take 5 mLs by mouth 4 (four) times daily as needed. 118 mL Shirlee Latch, PA-C      PDMP not reviewed this encounter.   Shirlee Latch, PA-C 10/14/22 1511

## 2022-10-22 ENCOUNTER — Ambulatory Visit: Payer: Self-pay

## 2022-10-27 DIAGNOSIS — G4733 Obstructive sleep apnea (adult) (pediatric): Secondary | ICD-10-CM | POA: Diagnosis not present

## 2022-10-27 DIAGNOSIS — I480 Paroxysmal atrial fibrillation: Secondary | ICD-10-CM | POA: Diagnosis not present

## 2022-10-27 DIAGNOSIS — I10 Essential (primary) hypertension: Secondary | ICD-10-CM | POA: Diagnosis not present

## 2022-10-27 DIAGNOSIS — Z6841 Body Mass Index (BMI) 40.0 and over, adult: Secondary | ICD-10-CM | POA: Diagnosis not present

## 2022-10-27 DIAGNOSIS — I890 Lymphedema, not elsewhere classified: Secondary | ICD-10-CM | POA: Diagnosis not present

## 2022-10-27 DIAGNOSIS — I4819 Other persistent atrial fibrillation: Secondary | ICD-10-CM | POA: Diagnosis not present

## 2022-10-27 DIAGNOSIS — I5022 Chronic systolic (congestive) heart failure: Secondary | ICD-10-CM | POA: Diagnosis not present

## 2022-11-20 DIAGNOSIS — I5022 Chronic systolic (congestive) heart failure: Secondary | ICD-10-CM | POA: Diagnosis not present

## 2023-02-09 ENCOUNTER — Ambulatory Visit (INDEPENDENT_AMBULATORY_CARE_PROVIDER_SITE_OTHER): Payer: Medicare HMO

## 2023-02-09 DIAGNOSIS — Z0000 Encounter for general adult medical examination without abnormal findings: Secondary | ICD-10-CM

## 2023-02-09 NOTE — Patient Instructions (Addendum)
Katherine Scott , Thank you for taking time to come for your Medicare Wellness Visit. I appreciate your ongoing commitment to your health goals. Please review the following plan we discussed and let me know if I can assist you in the future.   Referrals/Orders/Follow-Ups/Clinician Recommendations: none- declined referrals for colonoscopy and mammogram   This is a list of the screening recommended for you and due dates:  Health Maintenance  Topic Date Due   Hepatitis C Screening  Never done   DTaP/Tdap/Td vaccine (1 - Tdap) Never done   Pap with HPV screening  Never done   Colon Cancer Screening  Never done   Mammogram  Never done   Zoster (Shingles) Vaccine (1 of 2) Never done   COVID-19 Vaccine (3 - 2023-24 season) 12/10/2022   Flu Shot  07/09/2023*   Medicare Annual Wellness Visit  02/09/2024   HIV Screening  Completed   HPV Vaccine  Aged Out  *Topic was postponed. The date shown is not the original due date.    Advanced directives: (ACP Link)Information on Advanced Care Planning can be found at [facility] Directives Advance Health Care Directives ([email or web address]    Next Medicare Annual Wellness Visit scheduled for next year: Yes    02/15/24 @ 11:30 am by video

## 2023-02-09 NOTE — Progress Notes (Signed)
Subjective:   Katherine Scott is a 62 y.o. female who presents for an Initial Medicare Annual Wellness Visit.  Visit Complete: Virtual I connected with  Katherine Scott on 02/09/23 by a audio enabled telemedicine application and verified that I am speaking with the correct person using two identifiers.  Patient Location: Home  Provider Location: Office/Clinic  I discussed the limitations of evaluation and management by telemedicine. The patient expressed understanding and agreed to proceed.  Vital Signs: Because this visit was a virtual/telehealth visit, some criteria may be missing or patient reported. Any vitals not documented were not able to be obtained and vitals that have been documented are patient reported.   Cardiac Risk Factors include: advanced age (>28men, >31 women);hypertension;sedentary lifestyle;obesity (BMI >30kg/m2)     Objective:    Today's Vitals   02/09/23 1057  PainSc: 5    There is no height or weight on file to calculate BMI.     02/09/2023   11:02 AM 09/29/2022    1:00 PM 07/15/2021   11:29 AM 07/15/2021    7:46 AM 02/13/2020    2:07 AM 02/12/2020   12:55 PM 07/09/2019    4:55 PM  Advanced Directives  Does Patient Have a Medical Advance Directive? No No  No  No No  Would patient like information on creating a medical advance directive? No - Patient declined No - Patient declined No - Patient declined  No - Patient declined      Current Medications (verified) Outpatient Encounter Medications as of 02/09/2023  Medication Sig   albuterol (VENTOLIN HFA) 108 (90 Base) MCG/ACT inhaler INHALE 2 PUFFS INTO THE LUNGS EVERY 6 (SIX) HOURS AS NEEDED FOR WHEEZING.   apixaban (ELIQUIS) 5 MG TABS tablet Take 1 tablet (5 mg total) by mouth 2 (two) times daily.   BREZTRI AEROSPHERE 160-9-4.8 MCG/ACT AERO Inhale 2 puffs into the lungs 2 (two) times daily.   diltiazem (DILACOR XR) 240 MG 24 hr capsule Take 240 mg by mouth daily.   furosemide (LASIX) 40 MG tablet Take 2 tablets  (80 mg total) by mouth daily.   potassium chloride SA (KLOR-CON M) 20 MEQ tablet Take 1 tablet (20 mEq total) by mouth daily.   Spacer/Aero-Holding Chambers DEVI 1 Device by Does not apply route as needed.   azithromycin (ZITHROMAX) 250 MG tablet Take 1 tablet (250 mg total) by mouth daily. Take first 2 tablets together, then 1 every day until finished. (Patient not taking: Reported on 02/09/2023)   promethazine-dextromethorphan (PROMETHAZINE-DM) 6.25-15 MG/5ML syrup Take 5 mLs by mouth 4 (four) times daily as needed. (Patient not taking: Reported on 02/09/2023)   traMADol (ULTRAM) 50 MG tablet Take 1 tablet (50 mg total) by mouth every 6 (six) hours as needed. (Patient not taking: Reported on 02/09/2023)   No facility-administered encounter medications on file as of 02/09/2023.    Allergies (verified) Amiodarone   History: Past Medical History:  Diagnosis Date   Abnormal mammogram, unspecified 2013   Atrial fibrillation (HCC)    2013   Breast screening, unspecified    CHF (congestive heart failure) (HCC)    COPD (chronic obstructive pulmonary disease) (HCC)    History of continuous positive airway pressure (CPAP) therapy at home    Hypothyroidism    Obesity, unspecified    Pneumonia    Screening for obesity    Sleep apnea    Special screening for malignant neoplasms, colon    Unspecified essential hypertension    Past Surgical History:  Procedure Laterality Date   ABDOMINAL HYSTERECTOMY  2011   ANTERIOR CRUCIATE LIGAMENT REPAIR  2003   BREAST BIOPSY Left January 22, 2012   left breast stereotactic biopsy showed fibroadenomatous changes with microcalcifications, fat necrosis, and sclerosing adenosis and columnar cell changes. No evidence of atypia or malignancy.   CARDIOVERSION N/A 09/22/2016   Procedure: CARDIOVERSION;  Surgeon: Dalia Heading, MD;  Location: [facility];  Service: Cardiovascular;  Laterality: N/A;   CARDIOVERSION N/A 01/17/2018   Procedure: CARDIOVERSION;  Surgeon:  Dalia Heading, MD;  Location: [facility];  Service: Cardiovascular;  Laterality: N/A;   CARDIOVERSION N/A 01/09/2019   Procedure: CARDIOVERSION;  Surgeon: Dalia Heading, MD;  Location: [facility];  Service: Cardiovascular;  Laterality: N/A;   CESAREAN SECTION  1985, 1987, and 1998   COLONOSCOPY  2011   Dr. Mechele Collin   ELECTROPHYSIOLOGIC STUDY N/A 12/03/2014   Procedure: Cardioversion;  Surgeon: Dalia Heading, MD;  Location: [facility];  Service: Cardiovascular;  Laterality: N/A;   ELECTROPHYSIOLOGIC STUDY N/A 04/01/2015   Procedure: Cardioversion;  Surgeon: Dalia Heading, MD;  Location: [facility];  Service: Cardiovascular;  Laterality: N/A;   KNEE SURGERY  2013   PULMONARY THROMBECTOMY Bilateral 07/15/2021   Procedure: PULMONARY THROMBECTOMY;  Surgeon: Annice Needy, MD;  Location: [facility];  Service: Cardiovascular;  Laterality: Bilateral;   Family History  Problem Relation Age of Onset   Colon cancer Mother        diagnosis age 75's   Colon cancer Sister        diagnosis age 20   Atrial fibrillation Sister    Breast cancer Other        Maternal Grandmother; diagnosis age 55's   Lung cancer Other        Paternal Grandmother; diagnosis age 20's   Colon cancer Other        Paternal Grandfather; diagnosis age 52's   Heart disease Father    Social History   Socioeconomic History   Marital status: Divorced    Spouse name: Not on file   Number of children: Not on file   Years of education: Not on file   Highest education level: Not on file  Occupational History   Not on file  Tobacco Use   Smoking status: Never    Passive exposure: Yes   Smokeless tobacco: Never  Vaping Use   Vaping status: Never Used  Substance and Sexual Activity   Alcohol use: No   Drug use: No   Sexual activity: Not on file  Other Topics Concern   Not on file  Social History Narrative   Not on file   Social Determinants of Health   Financial Resource Strain: Medium Risk (02/09/2023)    Overall Financial Resource Strain (CARDIA)    Difficulty of Paying Living Expenses: Somewhat hard  Food Insecurity: No Food Insecurity (02/09/2023)   Hunger Vital Sign    Worried About Running Out of Food in the Last Year: Never true    Ran Out of Food in the Last Year: Never true  Transportation Needs: No Transportation Needs (02/09/2023)   PRAPARE - Administrator, Civil Service (Medical): No    Lack of Transportation (Non-Medical): No  Physical Activity: Inactive (02/09/2023)   Exercise Vital Sign    Days of Exercise per Week: 0 days    Minutes of Exercise per Session: 0 min  Stress: No Stress Concern Present (02/09/2023)   [employer] of Occupational Health -  Occupational Stress Questionnaire    Feeling of Stress : Only a little  Social Connections: Moderately Integrated (02/09/2023)   Social Connection and Isolation Panel [NHANES]    Frequency of Communication with Friends and Family: More than three times a week    Frequency of Social Gatherings with Friends and Family: Three times a week    Attends Religious Services: More than 4 times per year    Active Member of Clubs or Organizations: Yes    Attends Engineer, structural: More than 4 times per year    Marital Status: Divorced    Tobacco Counseling Counseling given: Not Answered   Clinical Intake:  Pre-visit preparation completed: Yes  Pain : 0-10 Pain Score: 5  Pain Type: Chronic pain Pain Location: Knee Pain Orientation: Right, Left Pain Radiating Towards: legs Pain Descriptors / Indicators: Aching, Radiating Pain Onset: More than a month ago Pain Frequency: Intermittent Pain Relieving Factors: tylenol  Pain Relieving Factors: tylenol  BMI - recorded: 62.4 Nutritional Status: BMI > 30  Obese Nutritional Risks: None Diabetes: No  How often do you need to have someone help you when you read instructions, pamphlets, or other written materials from your doctor or pharmacy?: 1 -  Never  Interpreter Needed?: No  Information entered by :: Kennedy Bucker, LPN   Activities of Daily Living    02/09/2023   11:03 AM 08/25/2022    2:39 PM  In your present state of health, do you have any difficulty performing the following activities:  Hearing? 0 0  Vision? 0 0  Difficulty concentrating or making decisions? 0 0  Walking or climbing stairs? 1 1  Comment knees   Dressing or bathing? 0 0  Doing errands, shopping? 0 0  Preparing Food and eating ? N   Using the Toilet? N   In the past six months, have you accidently leaked urine? N   Do you have problems with loss of bowel control? N   Managing your Medications? N   Managing your Finances? N   Housekeeping or managing your Housekeeping? N     Patient Care Team: Smitty Cords, DO as PCP - General (Family Medicine)  Indicate any recent Medical Services you may have received from other than [facility] providers in the past year (date may be approximate).     Assessment:   This is a routine wellness examination for [facility].  Hearing/Vision screen Hearing Screening - Comments:: No aids Vision Screening - Comments:: Readers-    Goals Addressed             This Visit's Progress    DIET - EAT MORE FRUITS AND VEGETABLES         Depression Screen    02/09/2023   11:00 AM 08/25/2022    2:39 PM 06/02/2022    8:35 AM 04/05/2022    9:21 AM 08/06/2019    3:37 PM 03/31/2019   10:14 AM 02/14/2019    4:13 PM  PHQ 2/9 Scores  PHQ - 2 Score 2 0 2 0 0 0 0  PHQ- 9 Score 3 6 5         Fall Risk    02/09/2023   11:03 AM 08/25/2022    2:40 PM 04/28/2020    9:56 AM 03/15/2020   10:23 AM 08/06/2019    3:37 PM  Fall Risk   Falls in the past year? 0 0 1 0 0  Number falls in past yr: 0 0 0 0 0  Injury  with Fall? 0 0 1 0 0  Risk for fall due to : No Fall Risks No Fall Risks History of fall(s)    Follow up Falls prevention discussed;Falls evaluation completed Falls evaluation completed Falls evaluation completed Falls  evaluation completed Falls evaluation completed    MEDICARE RISK AT HOME: Medicare Risk at Home Any stairs in or around the home?: Yes If so, are there any without handrails?: No Home free of loose throw rugs in walkways, pet beds, electrical cords, etc?: Yes Adequate lighting in your home to reduce risk of falls?: Yes Life alert?: No Use of a cane, walker or w/c?: Yes (cane when out anywhere) Grab bars in the bathroom?: Yes Shower chair or bench in shower?: No Elevated toilet seat or a handicapped toilet?: No  TIMED UP AND GO:  Was the test performed? No    Cognitive Function:        02/09/2023   11:04 AM  6CIT Screen  What Year? 0 points  What month? 0 points  What time? 0 points  Count back from 20 0 points  Months in reverse 0 points  Repeat phrase 0 points  Total Score 0 points    Immunizations Immunization History  Administered Date(s) Administered   Influenza,inj,Quad PF,6+ Mos 02/04/2019, 02/20/2020   PFIZER(Purple Top)SARS-COV-2 Vaccination 08/01/2019, 09/03/2019    TDAP status: Due, Education has been provided regarding the importance of this vaccine. Advised may receive this vaccine at local pharmacy or Health Dept. Aware to provide a copy of the vaccination record if obtained from local pharmacy or Health Dept. Verbalized acceptance and understanding.  Flu Vaccine status: Declined, Education has been provided regarding the importance of this vaccine but patient still declined. Advised may receive this vaccine at local pharmacy or Health Dept. Aware to provide a copy of the vaccination record if obtained from local pharmacy or Health Dept. Verbalized acceptance and understanding.  Pneumococcal vaccine status: Declined,  Education has been provided regarding the importance of this vaccine but patient still declined. Advised may receive this vaccine at local pharmacy or Health Dept. Aware to provide a copy of the vaccination record if obtained from local pharmacy  or Health Dept. Verbalized acceptance and understanding.   Covid-19 vaccine status: Completed vaccines  Qualifies for Shingles Vaccine? Yes   Zostavax completed No   Shingrix Completed?: No.    Education has been provided regarding the importance of this vaccine. Patient has been advised to call insurance company to determine out of pocket expense if they have not yet received this vaccine. Advised may also receive vaccine at local pharmacy or Health Dept. Verbalized acceptance and understanding.  Screening Tests Health Maintenance  Topic Date Due   Hepatitis C Screening  Never done   DTaP/Tdap/Td (1 - Tdap) Never done   Cervical Cancer Screening (HPV/Pap Cotest)  Never done   Colonoscopy  Never done   MAMMOGRAM  Never done   Zoster Vaccines- Shingrix (1 of 2) Never done   COVID-19 Vaccine (3 - 2023-24 season) 12/10/2022   INFLUENZA VACCINE  07/09/2023 (Originally 11/09/2022)   Medicare Annual Wellness (AWV)  02/09/2024   HIV Screening  Completed   HPV VACCINES  Aged Out    Health Maintenance  Health Maintenance Due  Topic Date Due   Hepatitis C Screening  Never done   DTaP/Tdap/Td (1 - Tdap) Never done   Cervical Cancer Screening (HPV/Pap Cotest)  Never done   Colonoscopy  Never done   MAMMOGRAM  Never done  Zoster Vaccines- Shingrix (1 of 2) Never done   COVID-19 Vaccine (3 - 2023-24 season) 12/10/2022    Declined referral for colonoscopy, mammogram   Lung Cancer Screening: (Low Dose CT Chest recommended if Age 56-80 years, 20 pack-year currently smoking OR have quit w/in 15years.) does not qualify.    Additional Screening:  Hepatitis C Screening: does qualify; Completed no  Vision Screening: Recommended annual ophthalmology exams for early detection of glaucoma and other disorders of the eye. Is the patient up to date with their annual eye exam?  No  Who is the provider or what is the name of the office in which the patient attends annual eye exams? No one If pt is  not established with a provider, would they like to be referred to a provider to establish care? No .   Dental Screening: Recommended annual dental exams for proper oral hygiene   Community Resource Referral / Chronic Care Management: CRR required this visit?  No   CCM required this visit?  No     Plan:     I have personally reviewed and noted the following in the patient's chart:   Medical and social history Use of alcohol, tobacco or illicit drugs  Current medications and supplements including opioid prescriptions. Patient is not currently taking opioid prescriptions. Functional ability and status Nutritional status Physical activity Advanced directives List of other physicians Hospitalizations, surgeries, and ER visits in previous 12 months Vitals Screenings to include cognitive, depression, and falls Referrals and appointments  In addition, I have reviewed and discussed with patient certain preventive protocols, quality metrics, and best practice recommendations. A written personalized care plan for preventive services as well as general preventive health recommendations were provided to patient.     Hal Hope, LPN   16/04/958   After Visit Summary: (MyChart) Due to this being a telephonic visit, the after visit summary with patients personalized plan was offered to patient via MyChart   Nurse Notes: none

## 2023-04-11 ENCOUNTER — Ambulatory Visit (INDEPENDENT_AMBULATORY_CARE_PROVIDER_SITE_OTHER): Payer: Medicare HMO

## 2023-04-11 ENCOUNTER — Ambulatory Visit
Admission: RE | Admit: 2023-04-11 | Discharge: 2023-04-11 | Disposition: A | Discharge status: Home / Self Care | Payer: Medicare HMO | Source: Ambulatory Visit | Attending: Emergency Medicine | Admitting: Emergency Medicine

## 2023-04-11 VITALS — BP 179/77 | HR 86 | Temp 98.3°F | Resp 20

## 2023-04-11 DIAGNOSIS — J441 Chronic obstructive pulmonary disease with (acute) exacerbation: Secondary | ICD-10-CM | POA: Diagnosis not present

## 2023-04-11 DIAGNOSIS — I517 Cardiomegaly: Secondary | ICD-10-CM | POA: Diagnosis not present

## 2023-04-11 DIAGNOSIS — J432 Centrilobular emphysema: Secondary | ICD-10-CM | POA: Diagnosis not present

## 2023-04-11 DIAGNOSIS — R059 Cough, unspecified: Secondary | ICD-10-CM | POA: Diagnosis not present

## 2023-04-11 MED ORDER — PROMETHAZINE-DM 6.25-15 MG/5ML PO SYRP: 5.0000 mL | ORAL_SOLUTION | Freq: Four times a day (QID) | ORAL | 0 refills | Status: DC | PRN

## 2023-04-11 MED ORDER — DOXYCYCLINE HYCLATE 100 MG PO CAPS: 100.0000 mg | ORAL_CAPSULE | Freq: Two times a day (BID) | ORAL | 0 refills | Status: DC

## 2023-04-11 MED ORDER — DEXAMETHASONE SODIUM PHOSPHATE 10 MG/ML IJ SOLN
10.0000 mg | Freq: Once | INTRAMUSCULAR | Status: AC
  Administered 2023-04-11: 10 mg via INTRAMUSCULAR

## 2023-04-11 MED ORDER — PREDNISONE 20 MG PO TABS: 60.0000 mg | ORAL_TABLET | Freq: Every day | ORAL | 0 refills | Status: AC

## 2023-04-11 MED ORDER — BENZONATATE 100 MG PO CAPS: 200.0000 mg | ORAL_CAPSULE | Freq: Three times a day (TID) | ORAL | 0 refills | Status: DC

## 2023-04-11 MED ORDER — IPRATROPIUM BROMIDE 0.06 % NA SOLN: 2.0000 | Freq: Four times a day (QID) | NASAL | 12 refills | Status: AC

## 2023-04-11 NOTE — ED Triage Notes (Addendum)
 Sx x 6 days  Productive cough with yellow mucus Sob Head congestion Fatigue Bodyaches Low grade fever   Hx of CHF and COPD

## 2023-04-11 NOTE — ED Provider Notes (Signed)
 [facility]    CSN: [id number] Arrival date & time: 04/11/23  1510      History   Chief Complaint Chief Complaint  Patient presents with   Cough    Been sick with head and chest congestion. Cough and running slight temperature. Tried to medicate but it's been 5 days no improvement. - Entered by patient   Fever   Shortness of Breath    HPI Katherine Scott is a 63 y.o. female.   HPI  63 year old female with a past medical history significant for atrial fibrillation, CHF, COPD, and hypothyroidism presents for evaluation of 6 days worth of respiratory symptoms to include fever with Tmax 100, fatigue, body aches, nasal congestion with a cloudy nasal discharge, productive cough for the same cloudy sputum production, shortness breath, and wheezing.  Past Medical History:  Diagnosis Date   Abnormal mammogram, unspecified 2013   Atrial fibrillation (HCC)    2013   Breast screening, unspecified    CHF (congestive heart failure) (HCC)    COPD (chronic obstructive pulmonary disease) (HCC)    History of continuous positive airway pressure (CPAP) therapy at home    Hypothyroidism    Obesity, unspecified    Pneumonia    Screening for obesity    Sleep apnea    Special screening for malignant neoplasms, colon    Unspecified essential hypertension     Patient Active Problem List   Diagnosis Date Noted   Centrilobular emphysema (HCC) 11/21/2021   OSA (obstructive sleep apnea) 07/16/2021   Pulmonary embolism with acute cor pulmonale (HCC) 07/15/2021   Chronic atrial fibrillation with RVR (HCC) 07/15/2021   Chronic systolic CHF (congestive heart failure) (HCC) 07/15/2021   Elevated troponin 07/15/2021   HTN (hypertension) 07/15/2021   Closed fracture of proximal end of left humerus 04/15/2020   Shortness of breath 02/12/2020   Venous ulcer (HCC) 10/09/2019   Lymphedema 02/26/2017   Recurrent cellulitis of lower extremity 01/20/2017   Acquired hypothyroidism 08/11/2016    BMI 60.0-69.9, adult (HCC) 08/11/2016   Persistent atrial fibrillation (HCC) 06/17/2014    Past Surgical History:  Procedure Laterality Date   ABDOMINAL HYSTERECTOMY  2011   ANTERIOR CRUCIATE LIGAMENT REPAIR  2003   BREAST BIOPSY Left January 22, 2012   left breast stereotactic biopsy showed fibroadenomatous changes with microcalcifications, fat necrosis, and sclerosing adenosis and columnar cell changes. No evidence of atypia or malignancy.   CARDIOVERSION N/A 09/22/2016   Procedure: CARDIOVERSION;  Surgeon: Bosie Vinie LABOR, MD;  Location: [facility];  Service: Cardiovascular;  Laterality: N/A;   CARDIOVERSION N/A 01/17/2018   Procedure: CARDIOVERSION;  Surgeon: Bosie Vinie LABOR, MD;  Location: [facility];  Service: Cardiovascular;  Laterality: N/A;   CARDIOVERSION N/A 01/09/2019   Procedure: CARDIOVERSION;  Surgeon: Bosie Vinie LABOR, MD;  Location: [facility];  Service: Cardiovascular;  Laterality: N/A;   CESAREAN SECTION  1985, 1987, and 1998   COLONOSCOPY  2011   Dr. Viktoria   ELECTROPHYSIOLOGIC STUDY N/A 12/03/2014   Procedure: Cardioversion;  Surgeon: Vinie LABOR Bosie, MD;  Location: [facility];  Service: Cardiovascular;  Laterality: N/A;   ELECTROPHYSIOLOGIC STUDY N/A 04/01/2015   Procedure: Cardioversion;  Surgeon: Vinie LABOR Bosie, MD;  Location: [facility];  Service: Cardiovascular;  Laterality: N/A;   KNEE SURGERY  2013   PULMONARY THROMBECTOMY Bilateral 07/15/2021   Procedure: PULMONARY THROMBECTOMY;  Surgeon: Marea Selinda RAMAN, MD;  Location: [facility];  Service: Cardiovascular;  Laterality: Bilateral;    OB History  Gravida  3   Para  3   Term      Preterm      AB      Living  3      SAB  0   IAB  0   Ectopic      Multiple      Live Births           Obstetric Comments  Age first menstrual cycle 8 Age first pregnancy 68  Last menstrual cycle 2011- due to hysterectomy          Home Medications    Prior to Admission medications   Medication Sig  Start Date End Date Taking? Authorizing Provider  albuterol  (VENTOLIN  HFA) 108 (90 Base) MCG/ACT inhaler INHALE 2 PUFFS INTO THE LUNGS EVERY 6 (SIX) HOURS AS NEEDED FOR WHEEZING. 03/17/20  Yes Hunsucker, Donnice SAUNDERS, MD  apixaban  (ELIQUIS ) 5 MG TABS tablet Take 1 tablet (5 mg total) by mouth 2 (two) times daily. 07/25/21  Yes Laurita Pillion, MD  benzonatate  (TESSALON ) 100 MG capsule Take 2 capsules (200 mg total) by mouth every 8 (eight) hours. 04/11/23  Yes Bernardino Ditch, NP  BREZTRI  AEROSPHERE 160-9-4.8 MCG/ACT AERO Inhale 2 puffs into the lungs 2 (two) times daily. 08/25/22  Yes Karamalegos, Marsa PARAS, DO  diltiazem  (DILACOR XR ) 240 MG 24 hr capsule Take 240 mg by mouth daily.   Yes [provider]  doxycycline  (VIBRAMYCIN ) 100 MG capsule Take 1 capsule (100 mg total) by mouth 2 (two) times daily. 04/11/23  Yes Bernardino Ditch, NP  furosemide  (LASIX ) 40 MG tablet Take 2 tablets (80 mg total) by mouth daily. 11/21/21  Yes Karamalegos, Marsa PARAS, DO  ipratropium (ATROVENT ) 0.06 % nasal spray Place 2 sprays into both nostrils 4 (four) times daily. 04/11/23  Yes Bernardino Ditch, NP  potassium chloride  SA (KLOR-CON  M) 20 MEQ tablet Take 1 tablet (20 mEq total) by mouth daily. 08/25/22  Yes Karamalegos, Marsa PARAS, DO  predniSONE  (DELTASONE ) 20 MG tablet Take 3 tablets (60 mg total) by mouth daily with breakfast for 5 days. 3 tablets daily for 5 days. 04/11/23 04/16/23 Yes Bernardino Ditch, NP  promethazine -dextromethorphan  (PROMETHAZINE -DM) 6.25-15 MG/5ML syrup Take 5 mLs by mouth 4 (four) times daily as needed. 04/11/23  Yes Bernardino Ditch, NP  Spacer/Aero-Holding Raguel DEVI 1 Device by Does not apply route as needed. 05/24/18  Yes Nyle Tinnie Fuller, NP  traMADol  (ULTRAM ) 50 MG tablet Take 1 tablet (50 mg total) by mouth every 6 (six) hours as needed. Patient not taking: Reported on 02/09/2023 09/02/21   Delores Orvin BRAVO, NP    Family History Family History  Problem Relation Age of Onset   Colon cancer Mother         diagnosis age 21's   Colon cancer Sister        diagnosis age 67   Atrial fibrillation Sister    Breast cancer Other        Maternal Grandmother; diagnosis age 39's   Lung cancer Other        Paternal Grandmother; diagnosis age 54's   Colon cancer Other        Paternal Grandfather; diagnosis age 34's   Heart disease Father     Social History Social History   Tobacco Use   Smoking status: Never    Passive exposure: Yes   Smokeless tobacco: Never  Vaping Use   Vaping status: Never Used  Substance Use Topics   Alcohol use: No   Drug  use: No     Allergies   Amiodarone    Review of Systems Review of Systems  Constitutional:  Positive for fatigue and fever.  HENT:  Positive for congestion and rhinorrhea. Negative for ear pain.   Respiratory:  Positive for cough, shortness of breath and wheezing.   Musculoskeletal:  Positive for arthralgias and myalgias.     Physical Exam Triage Vital Signs ED Triage Vitals  Encounter Vitals Group     BP      Systolic BP Percentile      Diastolic BP Percentile      Pulse      Resp      Temp      Temp src      SpO2      Weight      Height      Head Circumference      Peak Flow      Pain Score      Pain Loc      Pain Education      Exclude from Growth Chart    No data found.  Updated Vital Signs BP (!) 179/77 (BP Location: Right Wrist)   Pulse 86   Temp 98.3 F (36.8 C) (Oral)   Resp 20   SpO2 93%   Visual Acuity Right Eye Distance:   Left Eye Distance:   Bilateral Distance:    Right Eye Near:   Left Eye Near:    Bilateral Near:     Physical Exam Vitals and nursing note reviewed.  Constitutional:      Appearance: Normal appearance. She is obese. She is not ill-appearing.  HENT:     Head: Normocephalic and atraumatic.     Right Ear: Tympanic membrane, ear canal and external ear normal. There is no impacted cerumen.     Left Ear: Tympanic membrane, ear canal and external ear normal. There is no impacted  cerumen.     Nose: Congestion and rhinorrhea present.     Comments: Nasal mucosa is erythematous and edematous with scant clear discharge in both nares.    Mouth/Throat:     Mouth: Mucous membranes are moist.     Pharynx: Oropharynx is clear. No oropharyngeal exudate or posterior oropharyngeal erythema.  Cardiovascular:     Rate and Rhythm: Normal rate and regular rhythm.     Pulses: Normal pulses.     Heart sounds: Normal heart sounds. No murmur heard.    No friction rub. No gallop.  Pulmonary:     Effort: Pulmonary effort is normal.     Comments: Decreased breath sounds diffusely. Musculoskeletal:     Cervical back: Normal range of motion and neck supple. No tenderness.  Lymphadenopathy:     Cervical: No cervical adenopathy.  Skin:    General: Skin is warm and dry.     Capillary Refill: Capillary refill takes less than 2 seconds.     Findings: No rash.  Neurological:     General: No focal deficit present.     Mental Status: She is alert and oriented to person, place, and time.      UC Treatments / Results  Labs (all labs ordered are listed, but only abnormal results are displayed) Labs Reviewed - No data to display  EKG   Radiology DG Chest 2 View Result Date: 04/11/2023 CLINICAL DATA:  Cough for 6 days. EXAM: CHEST - 2 VIEW COMPARISON:  Chest radiograph dated 10/14/2022. FINDINGS: The heart is enlarged. Vascular calcifications are seen in the aortic  arch. The lungs are clear. Degenerative changes are seen in the spine. IMPRESSION: Cardiomegaly. No acute cardiopulmonary process. Electronically Signed   By: Norman Hopper M.D.   On: 04/11/2023 16:32    Procedures Procedures (including critical care time)  Medications Ordered in UC Medications  dexamethasone  (DECADRON ) injection 10 mg (has no administration in time range)    Initial Impression / Assessment and Plan / UC Course  I have reviewed the triage vital signs and the nursing notes.  Pertinent labs & imaging  results that were available during my care of the patient were reviewed by me and considered in my medical decision making (see chart for details).   Patient is a pleasant, nontoxic-appearing 63 year old female presenting for evaluation of 6 days with the respiratory symptoms as outlined HPI above.  In the exam room she is able to speak in full sentence without dyspnea or tachypnea.  Her room her oxygen  saturation is 95%.  She is afebrile with an oral temp of 98.3.  Evaluation of her upper respiratory tract does reveal inflamed nasal mucosa with clear rhinorrhea.  Her cardiopulmonary exam reveals diffusely decreased lung sounds.  Given that patient's had symptoms for 6 days she is outside the therapeutic window for antivirals for COVID or influenza so I will not test her for either of those viruses at this time.  I will obtain chest x-ray for to evaluate for any acute cardiopulmonary pathology.  Patient reports that she has been having to use her rescue inhaler daily.  Chest x-ray independently reviewed and evaluated by me.  Impression: Cardiomediastinal silhouette reveals cardiomegaly.  The right lung base is obscured and the left lung base is hazy, possibly secondary to patient's body habitus.  Radiology overread is pending. Radiology impression states cardiomegaly but no acute cardiopulmonary process.  I will discharge patient on the diagnosis of COPD exacerbation on doxycycline  100 mg twice daily for 7 days along with prednisone  60 mg once daily for 5 days to help decrease pulmonary inflammation.  I will have staff administer 10 mg of IM Decadron  prior to discharge.  Tessalon  Perles and Promethazine  DM cough syrup for cough and congestion.   Final Clinical Impressions(s) / UC Diagnoses   Final diagnoses:  Centrilobular emphysema (HCC)  COPD exacerbation (HCC)     Discharge Instructions      Take the doxycycline  twice daily with food for 7 days for treatment of your COPD exacerbation.  Use  albuterol  inhaler, with the spacer, 1 to 2 puffs every 4-6 hours as needed for any shortness of breath or wheezing.  Starting tomorrow morning take the prednisone  60 mg once daily for 5 days to decrease pulmonary inflammation and improve your work of breathing.  Use the Tessalon  Perles every 8 hours during the day as needed for cough.  Take them with a small sip of water.  They may give you numbness to the base of your tongue, or a metallic taste in your mouth, this is normal.  Use the Promethazine  DM cough syrup at bedtime as needed for cough and congestion.  If you develop any new or worsening symptoms other return for reevaluation, follow-up with your primary care provider, or seek care in the ER.       ED Prescriptions     Medication Sig Dispense Auth. Provider   benzonatate  (TESSALON ) 100 MG capsule Take 2 capsules (200 mg total) by mouth every 8 (eight) hours. 21 capsule Bernardino Ditch, NP   ipratropium (ATROVENT ) 0.06 % nasal spray Place  2 sprays into both nostrils 4 (four) times daily. 15 mL Bernardino Ditch, NP   doxycycline  (VIBRAMYCIN ) 100 MG capsule Take 1 capsule (100 mg total) by mouth 2 (two) times daily. 20 capsule Bernardino Ditch, NP   promethazine -dextromethorphan  (PROMETHAZINE -DM) 6.25-15 MG/5ML syrup Take 5 mLs by mouth 4 (four) times daily as needed. 118 mL Bernardino Ditch, NP   predniSONE  (DELTASONE ) 20 MG tablet Take 3 tablets (60 mg total) by mouth daily with breakfast for 5 days. 3 tablets daily for 5 days. 15 tablet Bernardino Ditch, NP      PDMP not reviewed this encounter.   Bernardino Ditch, NP 04/11/23 [phone]

## 2023-04-11 NOTE — Discharge Instructions (Addendum)
 Take the doxycycline  twice daily with food for 7 days for treatment of your COPD exacerbation.  Use albuterol  inhaler, with the spacer, 1 to 2 puffs every 4-6 hours as needed for any shortness of breath or wheezing.  Starting tomorrow morning take the prednisone  60 mg once daily for 5 days to decrease pulmonary inflammation and improve your work of breathing.  Use the Tessalon  Perles every 8 hours during the day as needed for cough.  Take them with a small sip of water.  They may give you numbness to the base of your tongue, or a metallic taste in your mouth, this is normal.  Use the Promethazine  DM cough syrup at bedtime as needed for cough and congestion.  If you develop any new or worsening symptoms other return for reevaluation, follow-up with your primary care provider, or seek care in the ER.

## 2023-04-30 ENCOUNTER — Encounter: Payer: Self-pay | Admitting: Physician Assistant

## 2023-04-30 ENCOUNTER — Ambulatory Visit: Payer: Self-pay | Admitting: *Deleted

## 2023-04-30 ENCOUNTER — Ambulatory Visit: Payer: Medicare HMO | Admitting: Physician Assistant

## 2023-04-30 VITALS — BP 132/84 | HR 88 | Resp 16 | Ht 63.0 in | Wt 342.0 lb

## 2023-04-30 DIAGNOSIS — L03116 Cellulitis of left lower limb: Secondary | ICD-10-CM | POA: Diagnosis not present

## 2023-04-30 MED ORDER — SULFAMETHOXAZOLE-TRIMETHOPRIM 800-160 MG PO TABS
1.0000 | ORAL_TABLET | Freq: Two times a day (BID) | ORAL | 0 refills | Status: AC
Start: 2023-04-30 — End: 2023-05-10

## 2023-04-30 NOTE — Progress Notes (Signed)
Acute Office Visit   Patient: Katherine Scott   DOB: [birth year]   63 y.o. Female  MRN: [id number] Visit Date: 04/30/2023  Today's healthcare provider: Oswaldo Conroy Deyci Gesell, PA-C  Introduced myself to the patient as a Secondary school teacher and provided education on APPs in clinical practice.    Chief Complaint  Patient presents with   Edema    X1 week- L leg. Red and painful.   Subjective    HPI HPI     Edema    Additional comments: X1 week- L leg. Red and painful.      Last edited by Dollene Primrose, CMA on 04/30/2023  1:19 PM.      Edema  Reports this started when she was sick a few weeks ago She reports when she was feeling ill she wasn't feeling up to using her compression stockings and was largely sedentary  She reports having previous instances of cellulitis and this feels similar  She reports she has noted some drainage and swelling of her left lower extremity  She denies SOB or trouble breathing since she recovered from recent illness She reports the area is stinging and itches     Medications: Outpatient Medications Prior to Visit  Medication Sig   albuterol (VENTOLIN HFA) 108 (90 Base) MCG/ACT inhaler INHALE 2 PUFFS INTO THE LUNGS EVERY 6 (SIX) HOURS AS NEEDED FOR WHEEZING.   apixaban (ELIQUIS) 5 MG TABS tablet Take 1 tablet (5 mg total) by mouth 2 (two) times daily.   BREZTRI AEROSPHERE 160-9-4.8 MCG/ACT AERO Inhale 2 puffs into the lungs 2 (two) times daily.   diltiazem (DILACOR XR) 240 MG 24 hr capsule Take 240 mg by mouth daily.   furosemide (LASIX) 40 MG tablet Take 2 tablets (80 mg total) by mouth daily.   ipratropium (ATROVENT) 0.06 % nasal spray Place 2 sprays into both nostrils 4 (four) times daily.   potassium chloride SA (KLOR-CON M) 20 MEQ tablet Take 1 tablet (20 mEq total) by mouth daily.   Spacer/Aero-Holding Chambers DEVI 1 Device by Does not apply route as needed.   traMADol (ULTRAM) 50 MG tablet Take 1 tablet (50 mg total) by mouth every 6 (six) hours as  needed.   [DISCONTINUED] doxycycline (VIBRAMYCIN) 100 MG capsule Take 1 capsule (100 mg total) by mouth 2 (two) times daily.   benzonatate (TESSALON) 100 MG capsule Take 2 capsules (200 mg total) by mouth every 8 (eight) hours. (Patient not taking: Reported on 04/30/2023)   promethazine-dextromethorphan (PROMETHAZINE-DM) 6.25-15 MG/5ML syrup Take 5 mLs by mouth 4 (four) times daily as needed. (Patient not taking: Reported on 04/30/2023)   No facility-administered medications prior to visit.    Review of Systems  Respiratory:  Negative for cough, shortness of breath and wheezing.   Cardiovascular:  Positive for leg swelling. Negative for chest pain and palpitations.         Objective    BP 132/84   Pulse 88   Resp 16   Ht 5\' 3"  (1.6 m)   Wt (!) 342 lb (155.1 kg)   SpO2 99%   BMI 60.58 kg/m      Physical Exam Vitals reviewed.  Constitutional:      General: She is awake.     Appearance: Normal appearance. She is well-developed and well-groomed.  HENT:     Head: Normocephalic and atraumatic.  Pulmonary:     Effort: Pulmonary effort is normal.  Musculoskeletal:     Right lower  leg: 2+ Pitting Edema present.     Left lower leg: 3+ Pitting Edema present.  Skin:    General: Skin is warm and dry.     Comments: Redness and swelling along left lower extremity. Macular erythematous area along shin of left lower extremity is tender to touch  Left calf is significantly larger than right.  Neurological:     General: No focal deficit present.     Mental Status: She is alert and oriented to person, place, and time.  Psychiatric:        Mood and Affect: Mood normal.        Behavior: Behavior normal. Behavior is cooperative.        Thought Content: Thought content normal.        Judgment: Judgment normal.       No results found for any visits on 04/30/23.  Assessment & Plan      No follow-ups on file.       Problem List Items Addressed This Visit   None Visit Diagnoses        Cellulitis of left lower extremity    -  Primary   Relevant Medications   sulfamethoxazole-trimethoprim (BACTRIM DS) 800-160 MG tablet      Acute, new concern Patient presents today with lower left leg swelling, redness and tenderness which appears consistent with cellulitis She is taking Eliquis as directed and denies trouble breathing or SOB. She does admit to being sedentary lately so we discussed the potential for DVT as well as signs and ED precautions Will start Bactrim for suspected cellulitis Reviewed home care measures- provided in AVS for her.  Follow up as needed for persistent or progressing symptoms     No follow-ups on file.   I, Matalie Romberger E Bronnie Vasseur, PA-C, have reviewed all documentation for this visit. The documentation on 04/30/23 for the exam, diagnosis, procedures, and orders are all accurate and complete.   Jacquelin Hawking, MHS, PA-C [facility]

## 2023-04-30 NOTE — Patient Instructions (Signed)
Keep the area clean with warm water and gentle cleanser. You can pat it dry with a cloth You can use Aquaphor, Vaseline or Eucerin to help with keeping it moisturized. Do not use other topicals such as Neosporin or Bacitracin as these can irritate the skin further If you notice increased swelling, difficulty breathing, your leg feels tight and stiff- please go to the ED for evaluation.

## 2023-04-30 NOTE — Telephone Encounter (Signed)
  Chief Complaint: left leg swelling Symptoms: pain, redness, swelling- hx cellulitis Frequency: started last week Pertinent Negatives: Patient denies fever Disposition: [] ED /[] Urgent Care (no appt availability in office) / [x] Appointment(In office/virtual)/ []  [facility]/ [] Home Care/ [] Refused Recommended Disposition /[] [facility]/ []  Follow-up with PCP Additional Notes: No open appointment today- patient scheduled with float provider

## 2023-04-30 NOTE — Telephone Encounter (Signed)
Summary: Swelling in the Left Leg with fluid advice   Pt is calling to report swelling in the left leg with fluid. Please advise         Reason for Disposition  [1] MODERATE leg swelling (e.g., swelling extends up to knees) AND [2] new-onset or worsening  Answer Assessment - Initial Assessment Questions 1. ONSET: "When did the swelling start?" (e.g., minutes, hours, days)     Last week- dry,swelling, patient had been sick and not moving 2. LOCATION: "What part of the leg is swollen?"  "Are both legs swollen or just one leg?"     Left leg- knee down 3. SEVERITY: "How bad is the swelling?" (e.g., localized; mild, moderate, severe)   - Localized: Small area of swelling localized to one leg.   - MILD pedal edema: Swelling limited to foot and ankle, pitting edema < 1/4 inch (6 mm) deep, rest and elevation eliminate most or all swelling.   - MODERATE edema: Swelling of lower leg to knee, pitting edema > 1/4 inch (6 mm) deep, rest and elevation only partially reduce swelling.   - SEVERE edema: Swelling extends above knee, facial or hand swelling present.      severe 4. REDNESS: "Does the swelling look red or infected?"     Yes- drainage 5. PAIN: "Is the swelling painful to touch?" If Yes, ask: "How painful is it?"   (Scale 1-10; mild, moderate or severe)     moderate 6. FEVER: "Do you have a fever?" If Yes, ask: "What is it, how was it measured, and when did it start?"      Leg warm- no temperature 7. CAUSE: "What do you think is causing the leg swelling?"     Hx cellulitis 8. MEDICAL HISTORY: "Do you have a history of blood clots (e.g., DVT), cancer, heart failure, kidney disease, or liver failure?"     Heart failure 9. RECURRENT SYMPTOM: "Have you had leg swelling before?" If Yes, ask: "When was the last time?" "What happened that time?"     Yes- flare of cellulitis last year 10. OTHER SYMPTOMS: "Do you have any other symptoms?" (e.g., chest pain, difficulty breathing)        no  Protocols used: Leg Swelling and Edema-A-AH

## 2023-05-01 ENCOUNTER — Ambulatory Visit: Payer: Medicare HMO | Admitting: Family Medicine

## 2023-06-09 ENCOUNTER — Telehealth: Admitting: Family Medicine

## 2023-06-09 DIAGNOSIS — R7981 Abnormal blood-gas level: Secondary | ICD-10-CM

## 2023-06-09 DIAGNOSIS — L03119 Cellulitis of unspecified part of limb: Secondary | ICD-10-CM | POA: Diagnosis not present

## 2023-06-09 MED ORDER — SULFAMETHOXAZOLE-TRIMETHOPRIM 800-160 MG PO TABS
1.0000 | ORAL_TABLET | Freq: Two times a day (BID) | ORAL | 0 refills | Status: AC
Start: 2023-06-09 — End: 2023-06-19

## 2023-06-09 NOTE — Patient Instructions (Signed)
 [name], thank you for joining Reed Pandy, PA-C for today's virtual visit.  While this provider is not your primary care provider (PCP), if your PCP is located in our provider database this encounter information will be shared with them immediately following your visit.   A [facility] MyChart account gives you access to today's visit and all your visits, tests, and labs performed at [facility] " click here if you don't have a [facility] MyChart account or go to [email or web address]  Consent: (Patient) Katherine Scott provided verbal consent for this virtual visit at the beginning of the encounter.  Current Medications:  Current Outpatient Medications:    sulfamethoxazole-trimethoprim (BACTRIM DS) 800-160 MG tablet, Take 1 tablet by mouth 2 (two) times daily for 10 days., Disp: 20 tablet, Rfl: 0   albuterol (VENTOLIN HFA) 108 (90 Base) MCG/ACT inhaler, INHALE 2 PUFFS INTO THE LUNGS EVERY 6 (SIX) HOURS AS NEEDED FOR WHEEZING., Disp: 6.7 each, Rfl: 5   apixaban (ELIQUIS) 5 MG TABS tablet, Take 1 tablet (5 mg total) by mouth 2 (two) times daily., Disp: 60 tablet, Rfl: 0   benzonatate (TESSALON) 100 MG capsule, Take 2 capsules (200 mg total) by mouth every 8 (eight) hours. (Patient not taking: Reported on 04/30/2023), Disp: 21 capsule, Rfl: 0   BREZTRI AEROSPHERE 160-9-4.8 MCG/ACT AERO, Inhale 2 puffs into the lungs 2 (two) times daily., Disp: 10.7 g, Rfl: 11   diltiazem (DILACOR XR) 240 MG 24 hr capsule, Take 240 mg by mouth daily., Disp: , Rfl:    furosemide (LASIX) 40 MG tablet, Take 2 tablets (80 mg total) by mouth daily., Disp: 180 tablet, Rfl: 3   ipratropium (ATROVENT) 0.06 % nasal spray, Place 2 sprays into both nostrils 4 (four) times daily., Disp: 15 mL, Rfl: 12   potassium chloride SA (KLOR-CON M) 20 MEQ tablet, Take 1 tablet (20 mEq total) by mouth daily., Disp: 90 tablet, Rfl: 3   promethazine-dextromethorphan (PROMETHAZINE-DM) 6.25-15 MG/5ML syrup, Take 5 mLs by  mouth 4 (four) times daily as needed. (Patient not taking: Reported on 04/30/2023), Disp: 118 mL, Rfl: 0   Spacer/Aero-Holding Chambers DEVI, 1 Device by Does not apply route as needed., Disp: 1 each, Rfl: 0   traMADol (ULTRAM) 50 MG tablet, Take 1 tablet (50 mg total) by mouth every 6 (six) hours as needed., Disp: 30 tablet, Rfl: 0   Medications ordered in this encounter:  Meds ordered this encounter  Medications   sulfamethoxazole-trimethoprim (BACTRIM DS) 800-160 MG tablet    Sig: Take 1 tablet by mouth 2 (two) times daily for 10 days.    Dispense:  20 tablet    Refill:  0     *If you need refills on other medications prior to your next appointment, please contact your pharmacy*  Follow-Up: Call back or seek an in-person evaluation if the symptoms worsen or if the condition fails to improve as anticipated.  [facility] [phone]  Other Instructions Cellulitis, Adult  Cellulitis is a skin infection. The infected area is usually warm, red, swollen, and tender. It most commonly occurs on the lower body, such as the legs, feet, and toes, but this condition can occur on any part of the body. The infection can travel to the muscles, blood, and underlying tissue and become life-threatening without treatment. It is important to get medical treatment right away for this condition. What are the causes? Cellulitis is caused by bacteria. The bacteria enter through a break in the skin,  such as a cut, burn, insect or animal bite, open sore, or crack. What increases the risk? This condition is more likely to occur in people who: Have a weak body's defense system (immune system). Are older than 63 years old. Have diabetes. Have a type of long-term (chronic) liver disease (cirrhosis) or kidney disease. Are obese. Have a skin condition such as: An itchy rash, such as eczema or psoriasis. A fungal rash on the feet or in skinfolds. Blistering rashes, such as shingles or  chickenpox. Slow movement of blood in the veins (venous stasis). Fluid buildup below the skin (edema). Have open wounds on the skin, such as cuts, puncture wounds, burns, bites, scrapes, tattoos, piercings, or wounds from surgery. Have had radiation therapy. Use IV drugs. What are the signs or symptoms? Symptoms of this condition include: Skin that looks red, purple, or slightly darker than your usual skin color. Streaks or spots on the skin. Swollen area of the skin. Tenderness or pain when an area of the skin is touched. Warm skin. Fever or chills. Blisters. Tiredness (fatigue). How is this diagnosed? This condition is diagnosed based on a medical history and physical exam. You may also have tests, including: Blood tests. Imaging tests. Tests on a sample of fluid taken from the wound (wound culture). How is this treated? Treatment for this condition may include: Medicines. These may include antibiotics or medicines to treat allergies (antihistamines). Rest. Applying cold or warm wet cloths (compresses) to the skin. If the condition is severe, you may need to stay in the hospital and get antibiotics through an IV. The infection usually starts to get better within 1-2 days of treatment. Follow these instructions at home: Medicines Take over-the-counter and prescription medicines only as told by your health care provider. If you were prescribed antibiotics, take them as told by your provider. Do not stop using the antibiotic even if you start to feel better. General instructions Drink enough fluid to keep your pee (urine) pale yellow. Do not touch or rub the infected area. Raise (elevate) the infected area above the level of your heart while you are sitting or lying down. Return to your normal activities as told by your provider. Ask your provider what activities are safe for you. Apply warm or cold compresses to the affected area as told by your provider. Keep all follow-up  visits. Your provider will need to make sure that a more serious infection is not developing. Contact a health care provider if: You have a fever. Your symptoms do not improve within 1-2 days of starting treatment or you develop new symptoms. Your bone or joint underneath the infected area becomes painful after the skin has healed. Your infection returns in the same area or another area. Signs of this may include: You notice a swollen bump in the infected area. Your red area gets larger, turns dark in color, or becomes more painful. Drainage increases. Pus or a bad smell develops in your infected area. You have more pain. You feel ill and have muscle aches and weakness. You develop vomiting or diarrhea that will not go away. Get help right away if: You notice red streaks coming from the infected area. You notice the skin turns purple or black and falls off. This symptom may be an emergency. Get help right away. Call 911. Do not wait to see if the symptom will go away. Do not drive yourself to the hospital. This information is not intended to replace advice given to you by  your health care provider. Make sure you discuss any questions you have with your health care provider. Document Revised: 11/22/2021 Document Reviewed: 11/22/2021 Elsevier Patient Education  2024 Else[employer] Inc.  Hypoxemia  Hypoxemia happens when the blood does not have enough oxygen in it. Every part of the body needs oxygen to work well. Oxygen enters the lungs when a person breathes in, and then it travels to all parts of the body through the blood. Hypoxemia can develop suddenly or slowly and can be mild to severe. What are the causes? Causes of hypoxemia may include: Lung conditions. These may include: Long-term (chronic) lung disease, such as: Asthma. Chronic obstructive pulmonary disease (COPD). Interstitial lung disease. Problems that affect breathing at night, such as sleep apnea. Fluid buildup in the  lungs. Lung infection (pneumonia). Lung or throat cancer. A collapsed lung. Heart or blood vessel (vascular) conditions, such as: A blood clot in the lungs (pulmonary embolism). Certain types of heart disease. Other causes may include: Certain diseases that affect nerves or muscles. Slow or shallow breathing due to being very overweight (obesity hypoventilation). High altitudes, as there is less oxygen in the air. Toxic chemicals, smoke, and gases. What are the signs or symptoms? In some cases, there may be no symptoms of this condition. If you do have symptoms, they may include: Shortness of breath. Breathing that is fast, noisy, or shallow. Bluish color of the skin, lips, or nail beds. A fast heartbeat. Feeling tired or sleepy. Feeling confused or agitated. If hypoxemia develops quickly, it is likely you will suddenly have trouble breathing. If hypoxemia develops slowly over months or years, you may not notice any symptoms. How is this diagnosed? This condition is diagnosed by: A physical exam. A blood test that measures the amount of oxygen in your blood. A test that measures the percentage of oxygen in your blood (pulse oximetry). This is done by placing a sensor on your finger, toe, or earlobe. How is this treated? Treatment for this condition depends on the cause or the severity of your hypoxemia. You will likely be treated with oxygen therapy to restore your blood oxygen level. You may need oxygen therapy for a short time, such as weeks or months, or you may need it for the rest of your life. You may be asked to lie on your stomach (prone). This may help bring your oxygen level up or help you feel less short of breath. Your health care provider may also recommend other therapies to treat the underlying cause of your hypoxemia. Follow these instructions at home:  Take over-the-counter and prescription medicines only as told by your health care provider. If you are on oxygen  therapy, follow oxygen safety precautions as directed by your health care provider. Precautions may include: Always have a backup supply of oxygen. Do not let anyone smoke or have an open flame near your oxygen supply. Handle oxygen tanks carefully as told by your health care provider. Do not use any products that contain nicotine or tobacco. These products include cigarettes, chewing tobacco, and vaping devices, such as e-cigarettes. If you need help quitting, ask your health care provider. Stay away from people who smoke. Keep all follow-up visits. This is important. Contact a health care provider if: You have any concerns about your oxygen therapy. You have trouble breathing, even while wearing an oxygen supply. You become short of breath when you exercise. You are still tired or have a headache when you wake up. Get help right away if:  Your shortness of breath gets worse, especially with normal activity or only a little bit of activity. Your skin, lips, or nail beds are a bluish color. You become confused, or you cannot think properly. You have chest pain. You have a fever. These symptoms may represent a serious problem that is an emergency. Do not wait to see if the symptoms will go away. Get medical help right away. Call your local emergency services (911 in the [place] Do not drive yourself to the hospital. Summary Hypoxemia occurs when the blood does not have enough oxygen in it. Hypoxemia may or may not cause symptoms. Often, the main symptom is shortness of breath. Depending on the cause of your hypoxemia, you may need oxygen therapy for a short time, such as weeks or months, or you may need it for the rest of your life. If you are on oxygen therapy, follow oxygen safety precautions as directed by your health care provider. This information is not intended to replace advice given to you by your health care provider. Make sure you discuss any questions you have with your health care  provider. Document Revised: 06/29/2020 Document Reviewed: 06/29/2020 Elsevier Patient Education  2024 Elsevier Inc.   If you have been instructed to have an in-person evaluation today at a local Urgent Care facility, please use the link below. It will take you to a list of all of our available [facility], including address, phone number and hours of operation. Please do not delay care.  [facility]  If you or a family member do not have a primary care provider, use the link below to schedule a visit and establish care. When you choose a [facility] primary care physician or advanced practice provider, you gain a long-term partner in health. Find a Primary Care Provider  Learn more about [facility] in-office and virtual care options: [facility] - Get Care Now

## 2023-06-09 NOTE — Progress Notes (Signed)
 Virtual Visit Consent   Katherine Scott, you are scheduled for a virtual visit with a [facility] provider today. Just as with appointments in the office, your consent must be obtained to participate. Your consent will be active for this visit and any virtual visit you may have with one of our providers in the next 365 days. If you have a MyChart account, a copy of this consent can be sent to you electronically.  As this is a virtual visit, video technology does not allow for your provider to perform a traditional examination. This may limit your provider's ability to fully assess your condition. If your provider identifies any concerns that need to be evaluated in person or the need to arrange testing (such as labs, EKG, etc.), we will make arrangements to do so. Although advances in technology are sophisticated, we cannot ensure that it will always work on either your end or our end. If the connection with a video visit is poor, the visit may have to be switched to a telephone visit. With either a video or telephone visit, we are not always able to ensure that we have a secure connection.  By engaging in this virtual visit, you consent to the provision of healthcare and authorize for your insurance to be billed (if applicable) for the services provided during this visit. Depending on your insurance coverage, you may receive a charge related to this service.  I need to obtain your verbal consent now. Are you willing to proceed with your visit today? Katherine Scott has provided verbal consent on 06/09/2023 for a virtual visit (video or telephone). Reed Pandy, [place]  Date: 06/09/2023 3:38 PM   Virtual Visit via Video Note   I, Reed Pandy, connected with  Katherine Scott  ([id number], 13-Nov-1960) on 06/09/23 at  3:30 PM EST by a video-enabled telemedicine application and verified that I am speaking with the correct person using two identifiers.  Location: Patient: Virtual Visit Location Patient:  Home Provider: Virtual Visit Location Provider: Home Office   I discussed the limitations of evaluation and management by telemedicine and the availability of in person appointments. The patient expressed understanding and agreed to proceed.    History of Present Illness: Katherine Scott is a 63 y.o. who identifies as a female who was assigned female at birth, and is being seen today for c/o having a recurring cellulitis problem.  Pt states she thinks it ahs come back.  She was seen in January by her doctor for it.  Pt states she started having chills last night.  Pt states she has not taken her temperature.  Pt states it is in the bottom of her left leg.  Pt denies open and no discharge but has swelling and burning and painful. Pt states it very hot to the touch.  Pt states she was treated with an antibiotic. Pt states she has low oxygen levels between 91-93 on room air but she states she has an oxygen tank and it goes up to 95-96. Pt denies difficulty breathing or shortness of breath.   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   Acquired hypothyroidism 08/11/2016   BMI 60.0-69.9, adult (HCC) 08/11/2016   Persistent atrial fibrillation (HCC) 06/17/2014    Allergies:  Allergies  Allergen Reactions   Amiodarone     Caused toxicity    Medications:  Current Outpatient Medications:    sulfamethoxazole-trimethoprim (BACTRIM DS)  800-160 MG tablet, Take 1 tablet by mouth 2 (two) times daily for 10 days., Disp: 20 tablet, Rfl: 0   albuterol (VENTOLIN HFA) 108 (90 Base) MCG/ACT inhaler, INHALE 2 PUFFS INTO THE LUNGS EVERY 6 (SIX) HOURS AS NEEDED FOR WHEEZING., Disp: 6.7 each, Rfl: 5   apixaban (ELIQUIS) 5 MG TABS tablet, Take 1 tablet (5 mg total) by mouth 2 (two) times daily., Disp: 60 tablet, Rfl: 0   benzonatate (TESSALON) 100 MG capsule, Take 2 capsules (200 mg total) by mouth every 8 (eight) hours. (Patient not taking: Reported on 04/30/2023), Disp: 21 capsule, Rfl: 0   BREZTRI AEROSPHERE 160-9-4.8 MCG/ACT AERO, Inhale 2 puffs into the lungs 2 (two) times daily., Disp: 10.7 g, Rfl: 11   diltiazem (DILACOR XR) 240 MG 24 hr capsule, Take 240 mg by mouth daily., Disp: , Rfl:    furosemide (LASIX) 40 MG tablet, Take 2 tablets (80 mg total) by mouth daily., Disp: 180 tablet, Rfl: 3   ipratropium (ATROVENT) 0.06 % nasal spray, Place 2 sprays into both nostrils 4 (four) times daily., Disp: 15 mL, Rfl: 12   potassium chloride SA (KLOR-CON M) 20 MEQ tablet, Take 1 tablet (20 mEq total) by mouth daily., Disp: 90 tablet, Rfl: 3   promethazine-dextromethorphan (PROMETHAZINE-DM) 6.25-15 MG/5ML syrup, Take 5 mLs by mouth 4 (four) times daily as needed. (Patient not taking: Reported on 04/30/2023), Disp: 118 mL, Rfl: 0   Spacer/Aero-Holding Chambers DEVI, 1 Device by Does not apply route as needed., Disp: 1 each, Rfl: 0   traMADol (ULTRAM) 50 MG tablet, Take 1 tablet (50 mg total) by mouth every 6 (six) hours as needed., Disp: 30 tablet, Rfl: 0  Observations/Objective: Patient is well-developed, well-nourished in no acute distress.  Resting comfortably at home.  Head is normocephalic, atraumatic.  No labored breathing. Speech is clear and coherent with logical content.  Patient is alert and oriented at baseline.  Erythematous lowe extremity without drainage   Assessment and Plan: 1. Recurrent cellulitis of lower extremity  (Primary) - sulfamethoxazole-trimethoprim (BACTRIM DS) 800-160 MG tablet; Take 1 tablet by mouth 2 (two) times daily for 10 days.  Dispense: 20 tablet; Refill: 0  2. Low oxygen saturation  -Start Bactrim for cellulitis -Advised Pt to proceed to emergency room if saturation falls below 95 on her supplemental oxygen or if worsening symptoms of cellulitis -Pt verbalized understanding.   Follow Up Instructions: I discussed the assessment and treatment plan with the patient. The patient was provided an opportunity to ask questions and all were answered. The patient agreed with the plan and demonstrated an understanding of the instructions.  A copy of instructions were sent to the patient via MyChart unless otherwise noted below.    The patient was advised to call back or seek an in-person evaluation if the symptoms worsen or if the condition fails to improve as anticipated.    Reed Pandy, PA-C

## 2023-06-15 ENCOUNTER — Telehealth (INDEPENDENT_AMBULATORY_CARE_PROVIDER_SITE_OTHER): Payer: Self-pay

## 2023-06-15 NOTE — Telephone Encounter (Signed)
 It looks like she has had several rounds of abx due to cellulitis in the last couple of months.  In this case, it is best to see her.

## 2023-06-15 NOTE — Telephone Encounter (Addendum)
 Katherine Scott stated she had a virtual visit with an urgent care Saturday and was Dx with cellulitis. They placed her on Bactrim, however she feels she needs a "booster" abx called in or she wants to know if you want to see her.   Please advise

## 2023-06-19 ENCOUNTER — Ambulatory Visit (INDEPENDENT_AMBULATORY_CARE_PROVIDER_SITE_OTHER): Admitting: Nurse Practitioner

## 2023-06-19 ENCOUNTER — Encounter (INDEPENDENT_AMBULATORY_CARE_PROVIDER_SITE_OTHER): Payer: Self-pay | Admitting: Nurse Practitioner

## 2023-06-19 VITALS — BP 160/76 | HR 79 | Resp 18 | Wt 339.8 lb

## 2023-06-19 DIAGNOSIS — I10 Essential (primary) hypertension: Secondary | ICD-10-CM | POA: Diagnosis not present

## 2023-06-19 DIAGNOSIS — L03119 Cellulitis of unspecified part of limb: Secondary | ICD-10-CM

## 2023-06-19 DIAGNOSIS — I890 Lymphedema, not elsewhere classified: Secondary | ICD-10-CM

## 2023-06-19 MED ORDER — CIPROFLOXACIN HCL 500 MG PO TABS
500.0000 mg | ORAL_TABLET | Freq: Two times a day (BID) | ORAL | 0 refills | Status: DC
Start: 2023-06-19 — End: 2023-09-11

## 2023-06-19 MED ORDER — FLUCONAZOLE 150 MG PO TABS: 150.0000 mg | ORAL_TABLET | Freq: Once | ORAL | 0 refills | Status: AC

## 2023-06-19 NOTE — Progress Notes (Signed)
 Subjective:    [name], female    DOB: 28-Feb-1961, 63 y.o.   MRN: [id number] Chief Complaint  Patient presents with   Follow-up    Left le cellulitis     The patient is a 63 year old patient who presents today due to recurrent episodes of cellulitis.  She has a history of lymphedema and several years ago she had significant issues with controlling her swelling and recurrent cellulitis.  Since that time however her swelling has been under fairly good control.  However recently in January she developed cellulitis and has had a recurrence late last month.  She was given Bactrim but that has not been helpful for control of her cellulitis.  Today she is still very swollen with significant evidence of cellulitis.  She denies any blisters or weeping at this time.  She has been very good about wearing medical grade control socks but is unable to do given the pain and discomfort caused by her cellulitis.    Review of Systems  Cardiovascular:  Positive for leg swelling.  All other systems reviewed and are negative.      Objective:   Physical Exam Vitals reviewed.  HENT:     Head: Normocephalic.  Cardiovascular:     Rate and Rhythm: Normal rate.  Pulmonary:     Effort: Pulmonary effort is normal.  Musculoskeletal:     Left lower leg: Edema present.  Skin:    General: Skin is warm and dry.  Neurological:     Mental Status: She is alert and oriented to person, place, and time.  Psychiatric:        Mood and Affect: Mood normal.        Behavior: Behavior normal.        Thought Content: Thought content normal.        Judgment: Judgment normal.     BP (!) 160/76   Pulse 79   Resp 18   Wt (!) 339 lb 12.8 oz (154.1 kg)   BMI 60.19 kg/m   Past Medical History:  Diagnosis Date   Abnormal mammogram, unspecified 2013   Atrial fibrillation (HCC)    2013   Breast screening, unspecified    CHF (congestive heart failure) (HCC)    COPD (chronic obstructive pulmonary  disease) (HCC)    History of continuous positive airway pressure (CPAP) therapy at home    Hypothyroidism    Obesity, unspecified    Pneumonia    Screening for obesity    Sleep apnea    Special screening for malignant neoplasms, colon    Unspecified essential hypertension     Social History   Socioeconomic History   Marital status: Divorced    Spouse name: Not on file   Number of children: Not on file   Years of education: Not on file   Highest education level: Not on file  Occupational History   Not on file  Tobacco Use   Smoking status: Never    Passive exposure: Yes   Smokeless tobacco: Never  Vaping Use   Vaping status: Never Used  Substance and Sexual Activity   Alcohol use: No   Drug use: No   Sexual activity: Not on file  Other Topics Concern   Not on file  Social History Narrative   Not on file   Social Drivers of Health   Financial Resource Strain: Medium Risk (02/09/2023)   Overall Financial Resource Strain (CARDIA)    Difficulty of Paying Living Expenses:  Somewhat hard  Food Insecurity: No Food Insecurity (02/09/2023)   Hunger Vital Sign    Worried About Running Out of Food in the Last Year: Never true    Ran Out of Food in the Last Year: Never true  Transportation Needs: No Transportation Needs (02/09/2023)   PRAPARE - Administrator, Civil Service (Medical): No    Lack of Transportation (Non-Medical): No  Physical Activity: Inactive (02/09/2023)   Exercise Vital Sign    Days of Exercise per Week: 0 days    Minutes of Exercise per Session: 0 min  Stress: No Stress Concern Present (02/09/2023)   [employer] of Occupational Health - Occupational Stress Questionnaire    Feeling of Stress : Only a little  Social Connections: Moderately Integrated (02/09/2023)   Social Connection and Isolation Panel [NHANES]    Frequency of Communication with Friends and Family: More than three times a week    Frequency of Social Gatherings with Friends and  Family: Three times a week    Attends Religious Services: More than 4 times per year    Active Member of Clubs or Organizations: Yes    Attends Banker Meetings: More than 4 times per year    Marital Status: Divorced  Intimate Partner Violence: Not At Risk (02/09/2023)   Humiliation, Afraid, Rape, and Kick questionnaire    Fear of Current or Ex-Partner: No    Emotionally Abused: No    Physically Abused: No    Sexually Abused: No    Past Surgical History:  Procedure Laterality Date   ABDOMINAL HYSTERECTOMY  2011   ANTERIOR CRUCIATE LIGAMENT REPAIR  2003   BREAST BIOPSY Left January 22, 2012   left breast stereotactic biopsy showed fibroadenomatous changes with microcalcifications, fat necrosis, and sclerosing adenosis and columnar cell changes. No evidence of atypia or malignancy.   CARDIOVERSION N/A 09/22/2016   Procedure: CARDIOVERSION;  Surgeon: Dalia Heading, MD;  Location: [facility];  Service: Cardiovascular;  Laterality: N/A;   CARDIOVERSION N/A 01/17/2018   Procedure: CARDIOVERSION;  Surgeon: Dalia Heading, MD;  Location: [facility];  Service: Cardiovascular;  Laterality: N/A;   CARDIOVERSION N/A 01/09/2019   Procedure: CARDIOVERSION;  Surgeon: Dalia Heading, MD;  Location: [facility];  Service: Cardiovascular;  Laterality: N/A;   CESAREAN SECTION  1985, 1987, and 1998   COLONOSCOPY  2011   Dr. Mechele Collin   ELECTROPHYSIOLOGIC STUDY N/A 12/03/2014   Procedure: Cardioversion;  Surgeon: Dalia Heading, MD;  Location: [facility];  Service: Cardiovascular;  Laterality: N/A;   ELECTROPHYSIOLOGIC STUDY N/A 04/01/2015   Procedure: Cardioversion;  Surgeon: Dalia Heading, MD;  Location: [facility];  Service: Cardiovascular;  Laterality: N/A;   KNEE SURGERY  2013   PULMONARY THROMBECTOMY Bilateral 07/15/2021   Procedure: PULMONARY THROMBECTOMY;  Surgeon: Annice Needy, MD;  Location: [facility];  Service: Cardiovascular;  Laterality: Bilateral;    Family History  Problem  Relation Age of Onset   Colon cancer Mother        diagnosis age 26's   Colon cancer Sister        diagnosis age 48   Atrial fibrillation Sister    Breast cancer Other        Maternal Grandmother; diagnosis age 69's   Lung cancer Other        Paternal Grandmother; diagnosis age 71's   Colon cancer Other        Paternal Grandfather; diagnosis age 43's   Heart disease  Father     Allergies  Allergen Reactions   Amiodarone     Caused toxicity        Latest Ref Rng & Units 07/21/2021    5:50 AM 07/20/2021    5:08 AM 07/19/2021    5:40 AM  CBC  WBC 4.0 - 10.5 K/uL 8.3  9.0  7.0   Hemoglobin 12.0 - 15.0 g/dL 96.0  45.4  09.8   Hematocrit 36.0 - 46.0 % 35.1  35.8  35.5   Platelets 150 - 400 K/uL 188  160  153       CMP     Component Value Date/Time   NA 135 07/21/2021 0550   NA 141 11/29/2011 0432   K 3.6 07/21/2021 0550   K 4.1 11/29/2011 0432   CL 95 (L) 07/21/2021 0550   CL 106 11/29/2011 0432   CO2 33 (H) 07/21/2021 0550   CO2 30 11/29/2011 0432   GLUCOSE 114 (H) 07/21/2021 0550   GLUCOSE 121 (H) 11/29/2011 0432   BUN 25 (H) 07/21/2021 0550   BUN 11 11/29/2011 0432   CREATININE 1.08 (H) 07/21/2021 0550   CREATININE 0.90 01/19/2017 0925   CALCIUM 8.9 07/21/2021 0550   CALCIUM 8.2 (L) 11/29/2011 0432   PROT 7.8 07/15/2021 0748   ALBUMIN 3.7 07/15/2021 0748   AST 16 07/15/2021 0748   ALT 16 07/15/2021 0748   ALKPHOS 68 07/15/2021 0748   BILITOT 1.1 07/15/2021 0748   GFRNONAA 59 (L) 07/21/2021 0550   GFRNONAA >60 11/29/2011 0432     No results found.     Assessment & Plan:   1. Lymphedema (Primary) The patient's lymphedema has been well-controlled until recent evidence of cellulitis and now she is having a second recurrence after only a couple of months.  She has been wearing her medical grade compression socks but due to the cellulitis she is not able to wear these.  If her issues continue she may be a good candidate for lymphedema pump.  2. Recurrent  cellulitis of lower extremity The patient has recurrent cellulitis.  I suspect that the cellulitis is being exacerbated by her swelling.  Will prescribe Cipro today as well as have the patient placed in Unna boots to help gain control of her swelling.  Will have her return in 3 weeks for evaluation  3. Primary hypertension Continue antihypertensive medications as already ordered, these medications have been reviewed and there are no changes at this time.   Current Outpatient Medications on File Prior to Visit  Medication Sig Dispense Refill   albuterol (VENTOLIN HFA) 108 (90 Base) MCG/ACT inhaler INHALE 2 PUFFS INTO THE LUNGS EVERY 6 (SIX) HOURS AS NEEDED FOR WHEEZING. 6.7 each 5   apixaban (ELIQUIS) 5 MG TABS tablet Take 1 tablet (5 mg total) by mouth 2 (two) times daily. 60 tablet 0   BREZTRI AEROSPHERE 160-9-4.8 MCG/ACT AERO Inhale 2 puffs into the lungs 2 (two) times daily. 10.7 g 11   diltiazem (DILACOR XR) 240 MG 24 hr capsule Take 240 mg by mouth daily.     furosemide (LASIX) 40 MG tablet Take 2 tablets (80 mg total) by mouth daily. 180 tablet 3   ipratropium (ATROVENT) 0.06 % nasal spray Place 2 sprays into both nostrils 4 (four) times daily. 15 mL 12   potassium chloride SA (KLOR-CON M) 20 MEQ tablet Take 1 tablet (20 mEq total) by mouth daily. 90 tablet 3   Spacer/Aero-Holding Chambers DEVI 1 Device by Does not apply route  as needed. 1 each 0   traMADol (ULTRAM) 50 MG tablet Take 1 tablet (50 mg total) by mouth every 6 (six) hours as needed. 30 tablet 0   benzonatate (TESSALON) 100 MG capsule Take 2 capsules (200 mg total) by mouth every 8 (eight) hours. (Patient not taking: Reported on 06/19/2023) 21 capsule 0   promethazine-dextromethorphan (PROMETHAZINE-DM) 6.25-15 MG/5ML syrup Take 5 mLs by mouth 4 (four) times daily as needed. (Patient not taking: Reported on 06/19/2023) 118 mL 0   No current facility-administered medications on file prior to visit.    There are no Patient  Instructions on file for this visit. No follow-ups on file.   Georgiana Spinner, NP

## 2023-06-26 ENCOUNTER — Ambulatory Visit (INDEPENDENT_AMBULATORY_CARE_PROVIDER_SITE_OTHER): Admitting: Nurse Practitioner

## 2023-06-26 ENCOUNTER — Encounter (INDEPENDENT_AMBULATORY_CARE_PROVIDER_SITE_OTHER): Payer: Self-pay

## 2023-06-26 VITALS — BP 156/76 | HR 87 | Resp 18 | Ht 63.0 in | Wt 339.0 lb

## 2023-06-26 DIAGNOSIS — I890 Lymphedema, not elsewhere classified: Secondary | ICD-10-CM

## 2023-06-26 NOTE — Progress Notes (Signed)
 History of Present Illness  There is no documented history at this time  Assessments & Plan   There are no diagnoses linked to this encounter.    Additional instructions  Subjective:  Patient presents with venous ulcer of the Left lower extremity.    Procedure:  3 layer unna wrap was placed Left lower extremity.   Plan:   Follow up in one week.  Calamine to help with itching

## 2023-07-03 ENCOUNTER — Ambulatory Visit (INDEPENDENT_AMBULATORY_CARE_PROVIDER_SITE_OTHER): Admitting: Nurse Practitioner

## 2023-07-03 ENCOUNTER — Encounter (INDEPENDENT_AMBULATORY_CARE_PROVIDER_SITE_OTHER): Payer: Self-pay

## 2023-07-03 VITALS — BP 184/82 | HR 83 | Resp 18 | Ht 63.0 in | Wt 331.0 lb

## 2023-07-03 DIAGNOSIS — I890 Lymphedema, not elsewhere classified: Secondary | ICD-10-CM | POA: Diagnosis not present

## 2023-07-03 NOTE — Progress Notes (Unsigned)
 History of Present Illness  There is no documented history at this time  Assessments & Plan   There are no diagnoses linked to this encounter.    Additional instructions  Subjective:  Patient presents with venous ulcer of the Left lower extremity.    Procedure:  3 layer unna wrap was placed Left lower extremity.   Plan:   Follow up in one week.

## 2023-07-10 ENCOUNTER — Encounter (INDEPENDENT_AMBULATORY_CARE_PROVIDER_SITE_OTHER): Payer: Self-pay

## 2023-07-10 ENCOUNTER — Ambulatory Visit (INDEPENDENT_AMBULATORY_CARE_PROVIDER_SITE_OTHER): Admitting: Nurse Practitioner

## 2023-07-10 VITALS — BP 177/77 | HR 86 | Resp 18 | Ht 62.0 in | Wt 331.0 lb

## 2023-07-10 DIAGNOSIS — I890 Lymphedema, not elsewhere classified: Secondary | ICD-10-CM

## 2023-07-10 NOTE — Progress Notes (Signed)
 History of Present Illness  There is no documented history at this time  Assessments & Plan   There are no diagnoses linked to this encounter.    Additional instructions  Subjective:  Patient presents with venous ulcer of the Left lower extremity.    Procedure:  3 layer unna wrap was placed Left lower extremity.   Plan:   Follow up in one week.

## 2023-07-11 ENCOUNTER — Ambulatory Visit
Admission: RE | Admit: 2023-07-11 | Discharge: 2023-07-11 | Disposition: A | Discharge status: Home / Self Care | Payer: Self-pay | Source: Ambulatory Visit | Attending: Family Medicine | Admitting: Family Medicine

## 2023-07-11 ENCOUNTER — Ambulatory Visit (INDEPENDENT_AMBULATORY_CARE_PROVIDER_SITE_OTHER)

## 2023-07-11 VITALS — BP 153/102 | HR 90 | Temp 98.5°F | Resp 18 | Ht 62.0 in | Wt 335.0 lb

## 2023-07-11 DIAGNOSIS — J432 Centrilobular emphysema: Secondary | ICD-10-CM | POA: Insufficient documentation

## 2023-07-11 DIAGNOSIS — J069 Acute upper respiratory infection, unspecified: Secondary | ICD-10-CM | POA: Diagnosis not present

## 2023-07-11 DIAGNOSIS — R051 Acute cough: Secondary | ICD-10-CM | POA: Insufficient documentation

## 2023-07-11 LAB — RESP PANEL BY RT-PCR (RSV, FLU A&B, COVID)  RVPGX2
Influenza A by PCR: NEGATIVE
Influenza B by PCR: NEGATIVE
Resp Syncytial Virus by PCR: NEGATIVE
SARS Coronavirus 2 by RT PCR: NEGATIVE

## 2023-07-11 MED ORDER — IPRATROPIUM-ALBUTEROL 0.5-2.5 (3) MG/3ML IN SOLN
3.0000 mL | Freq: Once | RESPIRATORY_TRACT | Status: AC
  Administered 2023-07-11: 3 mL via RESPIRATORY_TRACT

## 2023-07-11 MED ORDER — PROMETHAZINE-DM 6.25-15 MG/5ML PO SYRP: 5.0000 mL | ORAL_SOLUTION | Freq: Four times a day (QID) | ORAL | 0 refills | Status: DC | PRN

## 2023-07-11 MED ORDER — CEFDINIR 300 MG PO CAPS: 300.0000 mg | ORAL_CAPSULE | Freq: Two times a day (BID) | ORAL | 0 refills | Status: DC

## 2023-07-11 MED ORDER — PREDNISONE 50 MG PO TABS: 50.0000 mg | ORAL_TABLET | Freq: Every day | ORAL | 0 refills | Status: AC

## 2023-07-11 MED ORDER — ALBUTEROL SULFATE HFA 108 (90 BASE) MCG/ACT IN AERS: 2.0000 | INHALATION_SPRAY | RESPIRATORY_TRACT | 5 refills | Status: AC | PRN

## 2023-07-11 NOTE — ED Triage Notes (Signed)
 Pt c/o cough x3 days. States have COPD and CHF. Have had consistent cough since Monday evening. No fever not getting any better. - Entered by patient. States was exposed to PNA.

## 2023-07-11 NOTE — ED Provider Notes (Signed)
 [facility]    CSN: [id number] Arrival date & time: 07/11/23  1023      History   Chief Complaint Chief Complaint  Patient presents with   Cough    Appt    HPI Katherine Scott is a 63 y.o. female.   HPI  History obtained from the patient. Katherine Scott presents for has persistent productive cough since Monday.  Says she doesn't like to wait to long before she gets treated.  She has some shortness of breath. Denies fever or chest tightness. She uses a CPAP at night.  Oxygen dropped to 90% yesterday evening and needed to use her home oxygen at 1-2 L.    Has history of CHF and COPD.  She was exposed to pneumonia over the weekend.      Past Medical History:  Diagnosis Date   Abnormal mammogram, unspecified 2013   Atrial fibrillation (HCC)    2013   Breast screening, unspecified    CHF (congestive heart failure) (HCC)    COPD (chronic obstructive pulmonary disease) (HCC)    History of continuous positive airway pressure (CPAP) therapy at home    Hypothyroidism    Obesity, unspecified    Pneumonia    Screening for obesity    Sleep apnea    Special screening for malignant neoplasms, colon    Unspecified essential hypertension     Patient Active Problem List   Diagnosis Date Noted   Centrilobular emphysema (HCC) 11/21/2021   OSA (obstructive sleep apnea) 07/16/2021   Pulmonary embolism with acute cor pulmonale (HCC) 07/15/2021   Chronic atrial fibrillation with RVR (HCC) 07/15/2021   Chronic systolic CHF (congestive heart failure) (HCC) 07/15/2021   Elevated troponin 07/15/2021   HTN (hypertension) 07/15/2021   Closed fracture of proximal end of left humerus 04/15/2020   Shortness of breath 02/12/2020   Venous ulcer (HCC) 10/09/2019   Lymphedema 02/26/2017   Recurrent cellulitis of lower extremity 01/20/2017   Acquired hypothyroidism 08/11/2016   BMI 60.0-69.9, adult (HCC) 08/11/2016   Persistent atrial fibrillation (HCC) 06/17/2014    Past Surgical History:   Procedure Laterality Date   ABDOMINAL HYSTERECTOMY  2011   ANTERIOR CRUCIATE LIGAMENT REPAIR  2003   BREAST BIOPSY Left January 22, 2012   left breast stereotactic biopsy showed fibroadenomatous changes with microcalcifications, fat necrosis, and sclerosing adenosis and columnar cell changes. No evidence of atypia or malignancy.   CARDIOVERSION N/A 09/22/2016   Procedure: CARDIOVERSION;  Surgeon: Dalia Heading, MD;  Location: [facility];  Service: Cardiovascular;  Laterality: N/A;   CARDIOVERSION N/A 01/17/2018   Procedure: CARDIOVERSION;  Surgeon: Dalia Heading, MD;  Location: [facility];  Service: Cardiovascular;  Laterality: N/A;   CARDIOVERSION N/A 01/09/2019   Procedure: CARDIOVERSION;  Surgeon: Dalia Heading, MD;  Location: [facility];  Service: Cardiovascular;  Laterality: N/A;   CESAREAN SECTION  1985, 1987, and 1998   COLONOSCOPY  2011   Dr. Mechele Collin   ELECTROPHYSIOLOGIC STUDY N/A 12/03/2014   Procedure: Cardioversion;  Surgeon: Dalia Heading, MD;  Location: [facility];  Service: Cardiovascular;  Laterality: N/A;   ELECTROPHYSIOLOGIC STUDY N/A 04/01/2015   Procedure: Cardioversion;  Surgeon: Dalia Heading, MD;  Location: [facility];  Service: Cardiovascular;  Laterality: N/A;   KNEE SURGERY  2013   PULMONARY THROMBECTOMY Bilateral 07/15/2021   Procedure: PULMONARY THROMBECTOMY;  Surgeon: Annice Needy, MD;  Location: [facility];  Service: Cardiovascular;  Laterality: Bilateral;    OB History  Gravida  3   Para  3   Term      Preterm      AB      Living  3      SAB  0   IAB  0   Ectopic      Multiple      Live Births           Obstetric Comments  Age first menstrual cycle 23 Age first pregnancy 21  Last menstrual cycle 2011- due to hysterectomy          Home Medications    Prior to Admission medications   Medication Sig Start Date End Date Taking? Authorizing Provider  apixaban (ELIQUIS) 5 MG TABS tablet Take 1 tablet (5 mg total) by  mouth 2 (two) times daily. 07/25/21  Yes Marrion Coy, MD  benzonatate (TESSALON) 100 MG capsule Take 2 capsules (200 mg total) by mouth every 8 (eight) hours. 04/11/23  Yes Becky Augusta, NP  BREZTRI AEROSPHERE 160-9-4.8 MCG/ACT AERO Inhale 2 puffs into the lungs 2 (two) times daily. 08/25/22  Yes Karamalegos, Netta Neat, DO  cefdinir (OMNICEF) 300 MG capsule Take 1 capsule (300 mg total) by mouth 2 (two) times daily. 07/11/23  Yes Donnarae Rae, DO  ciprofloxacin (CIPRO) 500 MG tablet Take 1 tablet (500 mg total) by mouth 2 (two) times daily. 06/19/23  Yes Georgiana Spinner, NP  diltiazem (DILACOR XR) 240 MG 24 hr capsule Take 240 mg by mouth daily.   Yes [provider]  furosemide (LASIX) 40 MG tablet Take 2 tablets (80 mg total) by mouth daily. 11/21/21  Yes Karamalegos, Alexander J, DO  ipratropium (ATROVENT) 0.06 % nasal spray Place 2 sprays into both nostrils 4 (four) times daily. 04/11/23  Yes Becky Augusta, NP  potassium chloride SA (KLOR-CON M) 20 MEQ tablet Take 1 tablet (20 mEq total) by mouth daily. 08/25/22  Yes Karamalegos, Netta Neat, DO  predniSONE (DELTASONE) 50 MG tablet Take 1 tablet (50 mg total) by mouth daily for 5 days. 07/11/23 07/16/23 Yes Aneira Cavitt, DO  promethazine-dextromethorphan (PROMETHAZINE-DM) 6.25-15 MG/5ML syrup Take 5 mLs by mouth 4 (four) times daily as needed. 07/11/23  Yes Katha Cabal, DO  Spacer/Aero-Holding Chambers DEVI 1 Device by Does not apply route as needed. 05/24/18  Yes Galen Manila, NP  TIADYLT ER 240 MG 24 hr capsule Take 240 mg by mouth daily. 04/01/23  Yes [provider]  traMADol (ULTRAM) 50 MG tablet Take 1 tablet (50 mg total) by mouth every 6 (six) hours as needed. 09/02/21  Yes Georgiana Spinner, NP  albuterol (VENTOLIN HFA) 108 (90 Base) MCG/ACT inhaler Inhale 2 puffs into the lungs every 4 (four) hours as needed for wheezing. 07/11/23   Katha Cabal, DO    Family History Family History  Problem Relation Age of Onset    Colon cancer Mother        diagnosis age 49's   Colon cancer Sister        diagnosis age 38   Atrial fibrillation Sister    Breast cancer Other        Maternal Grandmother; diagnosis age 19's   Lung cancer Other        Paternal Grandmother; diagnosis age 66's   Colon cancer Other        Paternal Grandfather; diagnosis age 17's   Heart disease Father     Social History Social History   Tobacco Use   Smoking status: Never  Passive exposure: Yes   Smokeless tobacco: Never  Vaping Use   Vaping status: Never Used  Substance Use Topics   Alcohol use: No   Drug use: No     Allergies   Amiodarone   Review of Systems Review of Systems: negative unless otherwise stated in HPI.      Physical Exam Triage Vital Signs ED Triage Vitals [07/11/23 1029]  Encounter Vitals Group     BP      Systolic BP Percentile      Diastolic BP Percentile      Pulse      Resp 18     Temp      Temp Source Oral     SpO2      Weight      Height      Head Circumference      Peak Flow      Pain Score      Pain Loc      Pain Education      Exclude from Growth Chart    No data found.  Updated Vital Signs BP (!) 153/102 (BP Location: Left Arm)   Pulse 90   Temp 98.5 F (36.9 C) (Oral)   Resp 18   Ht 5\' 2"  (1.575 m)   Wt (!) 152 kg   SpO2 96%   BMI 61.27 kg/m   Visual Acuity Right Eye Distance:   Left Eye Distance:   Bilateral Distance:    Right Eye Near:   Left Eye Near:    Bilateral Near:     Physical Exam GEN:     alert, non-toxic appearing female    HENT:  mucus membranes moist, clear nasal discharge EYES:  no scleral injection or discharge RESP:  no increased work of breathing, coarse breathe sounds bilaterally, bilateral expiratory wheezing, pursed lip breathing CVS:   regular rate and rhythm Skin:   warm and dry    UC Treatments / Results  Labs (all labs ordered are listed, but only abnormal results are displayed) Labs Reviewed  RESP PANEL BY RT-PCR  (RSV, FLU A&B, COVID)  [id number]    EKG   Radiology DG Chest 2 View Result Date: 07/11/2023 CLINICAL DATA:  63 year old female with cough for 3 days. EXAM: CHEST - 2 VIEW COMPARISON:  Chest CT 07/15/2021. Chest radiographs 04/11/2023 and earlier. FINDINGS: PA and lateral views 1049 hours. Stable lung volumes, mild to moderate cardiomegaly. Visualized tracheal air column is within normal limits. No pneumothorax, pulmonary edema, pleural effusion or confluent lung opacity. No acute osseous abnormality identified. Negative visible bowel gas. IMPRESSION: No acute cardiopulmonary abnormality. Electronically Signed   By: Odessa Fleming M.D.   On: 07/11/2023 11:46     Procedures Procedures (including critical care time)  Medications Ordered in UC Medications  ipratropium-albuterol (DUONEB) 0.5-2.5 (3) MG/3ML nebulizer solution 3 mL (3 mLs Nebulization Given 07/11/23 1121)    Initial Impression / Assessment and Plan / UC Course  I have reviewed the triage vital signs and the nursing notes.  Pertinent labs & imaging results that were available during my care of the patient were reviewed by me and considered in my medical decision making (see chart for details).       Pt is a 63 y.o. female with history of emphysema, OSA and CHF presents for 3 days of respiratory symptoms. Cailan is afebrile here without recent antipyretics. Satting well on room air. Overall pt is non-toxic appearing, well hydrated, without respiratory distress. Pulmonary exam is  remarkable for pursed lip breathing, coarse breath sounds with expiratory wheezing bilaterally therefore chest x-ray obtained.  DuoNeb given with some improvement of her bilateral wheezing.  COVID, RSV and influenza panel obtained and was negative.   Chest xray personally reviewed by me without focal pneumonia, pleural effusion, or pneumothorax.  She does have some cardiomegaly but compared to her previous chest x-ray this appears to be similar to her baseline.   Radiologist impression reviewed.  Treat "COPD/emphysema" flareup with steroids and antibiotics as below.  Refilled albuterol inhaler.  Promethazine DM for cough.  Advised patient that she should follow-up with a pulmonologist.  She may have an element of obesity hypoventilation syndrome as well.  Denies history of smoking.  Strict ED precautions given as she does have history of bilateral pulmonary emboli as seen on her CTA chest on 07/15/2021.Marland Kitchen Discussed symptomatic treatment.  Typical duration of symptoms discussed.   Return and ED precautions given and voiced understanding. Discussed MDM, treatment plan and plan for follow-up with patient who agrees with plan.     Final Clinical Impressions(s) / UC Diagnoses   Final diagnoses:  Centrilobular emphysema (HCC)  Acute cough  Viral URI with cough     Discharge Instructions      Your COVID, influenza and RSV tests are negative. Your chest xray did not show evidence of pneumonia though the radiologist has not yet read it. If they find something that I didn't, I will call you.     Stop by the pharmacy to pick up your prescriptions.  Follow up with your primary care provider or return to the urgent care, if not improving.       ED Prescriptions     Medication Sig Dispense Auth. Provider   predniSONE (DELTASONE) 50 MG tablet Take 1 tablet (50 mg total) by mouth daily for 5 days. 5 tablet Jackson Fetters, DO   cefdinir (OMNICEF) 300 MG capsule Take 1 capsule (300 mg total) by mouth 2 (two) times daily. 14 capsule Sean Malinowski, DO   promethazine-dextromethorphan (PROMETHAZINE-DM) 6.25-15 MG/5ML syrup Take 5 mLs by mouth 4 (four) times daily as needed. 118 mL Breiana Stratmann, DO   albuterol (VENTOLIN HFA) 108 (90 Base) MCG/ACT inhaler Inhale 2 puffs into the lungs every 4 (four) hours as needed for wheezing. 6.7 each Katha Cabal, DO      PDMP not reviewed this encounter.   Katha Cabal, DO 07/14/23 1236

## 2023-07-11 NOTE — Discharge Instructions (Addendum)
 Your COVID, influenza and RSV tests are negative. Your chest xray did not show evidence of pneumonia though the radiologist has not yet read it. If they find something that I didn't, I will call you.     Stop by the pharmacy to pick up your prescriptions.  Follow up with your primary care provider or return to the urgent care, if not improving.

## 2023-07-15 ENCOUNTER — Ambulatory Visit

## 2023-07-17 ENCOUNTER — Ambulatory Visit (INDEPENDENT_AMBULATORY_CARE_PROVIDER_SITE_OTHER): Admitting: Nurse Practitioner

## 2023-07-18 ENCOUNTER — Ambulatory Visit
Admission: RE | Admit: 2023-07-18 | Discharge: 2023-07-18 | Disposition: A | Discharge status: Home / Self Care | Source: Ambulatory Visit | Attending: Emergency Medicine | Admitting: Emergency Medicine

## 2023-07-18 VITALS — BP 107/63 | HR 97 | Temp 99.0°F | Resp 20 | Ht 62.0 in | Wt 335.0 lb

## 2023-07-18 DIAGNOSIS — J441 Chronic obstructive pulmonary disease with (acute) exacerbation: Secondary | ICD-10-CM | POA: Diagnosis not present

## 2023-07-18 MED ORDER — PREDNISONE 10 MG (21) PO TBPK: ORAL_TABLET | Freq: Every day | ORAL | 0 refills | Status: DC

## 2023-07-18 MED ORDER — AEROCHAMBER PLUS FLO-VU MEDIUM MISC
1.0000 | Freq: Once | Status: AC
  Administered 2023-07-18: 1

## 2023-07-18 MED ORDER — PROMETHAZINE-DM 6.25-15 MG/5ML PO SYRP: 5.0000 mL | ORAL_SOLUTION | Freq: Four times a day (QID) | ORAL | 0 refills | Status: DC | PRN

## 2023-07-18 NOTE — Discharge Instructions (Addendum)
 Starting tomorrow morning begin the prednisone taper.  This will be for a longer period of time but should help decrease the pulmonary inflammation and help with your breathing.  Use the spacer when using your albuterol inhaler.  This will allow the medication to form a mist so that more of it makes it to your lungs and less of it is absorbed by the tissues in your mouth.  You may take 1 to 2 puffs every 4-6 hours as needed for shortness of breath or wheezing.  Continue to use the Promethazine DM cough syrup as needed for cough and congestion.  I have made a referral to [facility] pulmonology.  They will contact you to schedule an appointment to be evaluated for your breathing issues.

## 2023-07-18 NOTE — ED Triage Notes (Signed)
 Pt c/o cough,sob & wheezing x1 wk. Was seen on 4/2 for the same issue & was given cefdinir,prednisone,albuterol & promethazine w/o relief. Hx of CHF & COPD. Had last dose of abx yesterday.

## 2023-07-18 NOTE — ED Provider Notes (Signed)
 [facility]    CSN: [id number] Arrival date & time: 07/18/23  1743      History   Chief Complaint Chief Complaint  Patient presents with   Wheezing    Appt   Cough   Shortness of Breath    HPI Katherine Scott is a 63 y.o. female.   HPI  63 year old female with past medical history significant for persistent atrial fibrillation, acquired hypothyroidism, lymphedema, recurrent cellulitis, CHF, hypertension, past history of PE, and COPD presents for evaluation of ongoing cough with shortness of breath and wheezing x 1 week.  She was seen 1 week ago in this urgent care and had a negative chest x-ray and was treated for a COPD exacerbation with cefdinir, prednisone, albuterol, and Promethazine DM cough syrup.  She reports that her cough has improved some but is still present.  Her biggest complaint is the shortness of breath and wheezing despite using her rescue inhaler.  She is not using a spacer.  Past Medical History:  Diagnosis Date   Abnormal mammogram, unspecified 2013   Atrial fibrillation (HCC)    2013   Breast screening, unspecified    CHF (congestive heart failure) (HCC)    COPD (chronic obstructive pulmonary disease) (HCC)    History of continuous positive airway pressure (CPAP) therapy at home    Hypothyroidism    Obesity, unspecified    Pneumonia    Screening for obesity    Sleep apnea    Special screening for malignant neoplasms, colon    Unspecified essential hypertension     Patient Active Problem List   Diagnosis Date Noted   Centrilobular emphysema (HCC) 11/21/2021   OSA (obstructive sleep apnea) 07/16/2021   Pulmonary embolism with acute cor pulmonale (HCC) 07/15/2021   Chronic atrial fibrillation with RVR (HCC) 07/15/2021   Chronic systolic CHF (congestive heart failure) (HCC) 07/15/2021   Elevated troponin 07/15/2021   HTN (hypertension) 07/15/2021   Closed fracture of proximal end of left humerus 04/15/2020   Shortness of breath 02/12/2020    Venous ulcer (HCC) 10/09/2019   Lymphedema 02/26/2017   Recurrent cellulitis of lower extremity 01/20/2017   Acquired hypothyroidism 08/11/2016   BMI 60.0-69.9, adult (HCC) 08/11/2016   Persistent atrial fibrillation (HCC) 06/17/2014    Past Surgical History:  Procedure Laterality Date   ABDOMINAL HYSTERECTOMY  2011   ANTERIOR CRUCIATE LIGAMENT REPAIR  2003   BREAST BIOPSY Left January 22, 2012   left breast stereotactic biopsy showed fibroadenomatous changes with microcalcifications, fat necrosis, and sclerosing adenosis and columnar cell changes. No evidence of atypia or malignancy.   CARDIOVERSION N/A 09/22/2016   Procedure: CARDIOVERSION;  Surgeon: Dalia Heading, MD;  Location: [facility];  Service: Cardiovascular;  Laterality: N/A;   CARDIOVERSION N/A 01/17/2018   Procedure: CARDIOVERSION;  Surgeon: Dalia Heading, MD;  Location: [facility];  Service: Cardiovascular;  Laterality: N/A;   CARDIOVERSION N/A 01/09/2019   Procedure: CARDIOVERSION;  Surgeon: Dalia Heading, MD;  Location: [facility];  Service: Cardiovascular;  Laterality: N/A;   CESAREAN SECTION  1985, 1987, and 1998   COLONOSCOPY  2011   Dr. Mechele Collin   ELECTROPHYSIOLOGIC STUDY N/A 12/03/2014   Procedure: Cardioversion;  Surgeon: Dalia Heading, MD;  Location: [facility];  Service: Cardiovascular;  Laterality: N/A;   ELECTROPHYSIOLOGIC STUDY N/A 04/01/2015   Procedure: Cardioversion;  Surgeon: Dalia Heading, MD;  Location: [facility];  Service: Cardiovascular;  Laterality: N/A;   KNEE SURGERY  2013   PULMONARY THROMBECTOMY Bilateral  07/15/2021   Procedure: PULMONARY THROMBECTOMY;  Surgeon: Annice Needy, MD;  Location: [facility];  Service: Cardiovascular;  Laterality: Bilateral;    OB History     Gravida  3   Para  3   Term      Preterm      AB      Living  3      SAB  0   IAB  0   Ectopic      Multiple      Live Births           Obstetric Comments  Age first menstrual cycle 15 Age first  pregnancy 28  Last menstrual cycle 2011- due to hysterectomy          Home Medications    Prior to Admission medications   Medication Sig Start Date End Date Taking? Authorizing Provider  albuterol (VENTOLIN HFA) 108 (90 Base) MCG/ACT inhaler Inhale 2 puffs into the lungs every 4 (four) hours as needed for wheezing. 07/11/23  Yes Brimage, Vondra, DO  apixaban (ELIQUIS) 5 MG TABS tablet Take 1 tablet (5 mg total) by mouth 2 (two) times daily. 07/25/21  Yes Marrion Coy, MD  benzonatate (TESSALON) 100 MG capsule Take 2 capsules (200 mg total) by mouth every 8 (eight) hours. 04/11/23  Yes Becky Augusta, NP  BREZTRI AEROSPHERE 160-9-4.8 MCG/ACT AERO Inhale 2 puffs into the lungs 2 (two) times daily. 08/25/22  Yes Karamalegos, Netta Neat, DO  ciprofloxacin (CIPRO) 500 MG tablet Take 1 tablet (500 mg total) by mouth 2 (two) times daily. 06/19/23  Yes Georgiana Spinner, NP  diltiazem (DILACOR XR) 240 MG 24 hr capsule Take 240 mg by mouth daily.   Yes [provider]  furosemide (LASIX) 40 MG tablet Take 2 tablets (80 mg total) by mouth daily. 11/21/21  Yes Karamalegos, Alexander J, DO  ipratropium (ATROVENT) 0.06 % nasal spray Place 2 sprays into both nostrils 4 (four) times daily. 04/11/23  Yes Becky Augusta, NP  potassium chloride SA (KLOR-CON M) 20 MEQ tablet Take 1 tablet (20 mEq total) by mouth daily. 08/25/22  Yes Karamalegos, Netta Neat, DO  predniSONE (STERAPRED UNI-PAK 21 TAB) 10 MG (21) TBPK tablet Take by mouth daily. Take 6 tabs by mouth daily  for 2 days, then 5 tabs for 2 days, then 4 tabs for 2 days, then 3 tabs for 2 days, 2 tabs for 2 days, then 1 tab by mouth daily for 2 days 07/18/23  Yes Becky Augusta, NP  Spacer/Aero-Holding Deretha Emory DEVI 1 Device by Does not apply route as needed. 05/24/18  Yes Galen Manila, NP  TIADYLT ER 240 MG 24 hr capsule Take 240 mg by mouth daily. 04/01/23  Yes [provider]  traMADol (ULTRAM) 50 MG tablet Take 1 tablet (50 mg total) by mouth  every 6 (six) hours as needed. 09/02/21  Yes Georgiana Spinner, NP  promethazine-dextromethorphan (PROMETHAZINE-DM) 6.25-15 MG/5ML syrup Take 5 mLs by mouth 4 (four) times daily as needed. 07/18/23   Becky Augusta, NP    Family History Family History  Problem Relation Age of Onset   Colon cancer Mother        diagnosis age 104's   Colon cancer Sister        diagnosis age 73   Atrial fibrillation Sister    Breast cancer Other        Maternal Grandmother; diagnosis age 84's   Lung cancer Other  Paternal Grandmother; diagnosis age 67's   Colon cancer Other        Paternal Grandfather; diagnosis age 71's   Heart disease Father     Social History Social History   Tobacco Use   Smoking status: Never    Passive exposure: Yes   Smokeless tobacco: Never  Vaping Use   Vaping status: Never Used  Substance Use Topics   Alcohol use: No   Drug use: No     Allergies   Amiodarone   Review of Systems Review of Systems  Constitutional:  Negative for fever.  Respiratory:  Positive for shortness of breath and wheezing.      Physical Exam Triage Vital Signs ED Triage Vitals  Encounter Vitals Group     BP      Systolic BP Percentile      Diastolic BP Percentile      Pulse      Resp      Temp      Temp src      SpO2      Weight      Height      Head Circumference      Peak Flow      Pain Score      Pain Loc      Pain Education      Exclude from Growth Chart    No data found.  Updated Vital Signs BP 107/63 (BP Location: Left Arm)   Pulse 97   Temp 99 F (37.2 C) (Oral)   Resp 20   Ht 5\' 2"  (1.575 m)   Wt (!) 335 lb (152 kg)   SpO2 95%   BMI 61.27 kg/m   Visual Acuity Right Eye Distance:   Left Eye Distance:   Bilateral Distance:    Right Eye Near:   Left Eye Near:    Bilateral Near:     Physical Exam Vitals and nursing note reviewed.  Constitutional:      Appearance: Normal appearance. She is not ill-appearing.  HENT:     Head: Normocephalic and  atraumatic.  Cardiovascular:     Rate and Rhythm: Normal rate and regular rhythm.     Pulses: Normal pulses.     Heart sounds: Normal heart sounds. No murmur heard.    No friction rub. No gallop.  Pulmonary:     Effort: Pulmonary effort is normal.     Breath sounds: Normal breath sounds. No wheezing, rhonchi or rales.  Skin:    General: Skin is warm and dry.     Capillary Refill: Capillary refill takes less than 2 seconds.  Neurological:     Mental Status: She is alert.      UC Treatments / Results  Labs (all labs ordered are listed, but only abnormal results are displayed) Labs Reviewed - No data to display  EKG   Radiology No results found.  Procedures Procedures (including critical care time)  Medications Ordered in UC Medications  AeroChamber Plus Flo-Vu Medium MISC 1 each (has no administration in time range)    Initial Impression / Assessment and Plan / UC Course  I have reviewed the triage vital signs and the nursing notes.  Pertinent labs & imaging results that were available during my care of the patient were reviewed by me and considered in my medical decision making (see chart for details).   Patient is a pleasant, nontoxic-appearing 63 year old female presenting for evaluation of ongoing respiratory symptoms as outlined HPI above.  In  the exam room she is able to speak in full sentences without dyspnea or tachypnea.  Her cardiopulmonary exam reveals clear lung sounds in all fields.  Patient did cough during the exam and at that point had some expiratory wheezing but not with normal respiration.  I suspect that she has continued pulmonary inflammation as a result of her recent viral illness.  I also think that she is not getting the full benefit of her albuterol given that she is not using a spacer.  I will have staff dispense a spacer to her prior to discharge, discharge her home on a longer prednisone taper, refill her Promethazine DM cough syrup, and refer her  to pulmonology as she is not ever been evaluated by pulmonology despite having COPD.   Final Clinical Impressions(s) / UC Diagnoses   Final diagnoses:  COPD exacerbation [facility])     Discharge Instructions      Starting tomorrow morning begin the prednisone taper.  This will be for a longer period of time but should help decrease the pulmonary inflammation and help with your breathing.  Use the spacer when using your albuterol inhaler.  This will allow the medication to form a mist so that more of it makes it to your lungs and less of it is absorbed by the tissues in your mouth.  You may take 1 to 2 puffs every 4-6 hours as needed for shortness of breath or wheezing.  Continue to use the Promethazine DM cough syrup as needed for cough and congestion.  I have made a referral to [facility] pulmonology.  They will contact you to schedule an appointment to be evaluated for your breathing issues.     ED Prescriptions     Medication Sig Dispense Auth. Provider   promethazine-dextromethorphan (PROMETHAZINE-DM) 6.25-15 MG/5ML syrup Take 5 mLs by mouth 4 (four) times daily as needed. 118 mL Becky Augusta, NP   predniSONE (STERAPRED UNI-PAK 21 TAB) 10 MG (21) TBPK tablet Take by mouth daily. Take 6 tabs by mouth daily  for 2 days, then 5 tabs for 2 days, then 4 tabs for 2 days, then 3 tabs for 2 days, 2 tabs for 2 days, then 1 tab by mouth daily for 2 days 42 tablet Becky Augusta, NP      PDMP not reviewed this encounter.   Becky Augusta, NP 07/18/23 [phone]

## 2023-07-19 ENCOUNTER — Encounter: Payer: Self-pay | Admitting: Pulmonary Disease

## 2023-07-19 ENCOUNTER — Other Ambulatory Visit
Admission: RE | Admit: 2023-07-19 | Discharge: 2023-07-19 | Disposition: A | Discharge status: Home / Self Care | Source: Ambulatory Visit | Attending: Pulmonary Disease | Admitting: Pulmonary Disease

## 2023-07-19 ENCOUNTER — Ambulatory Visit: Admitting: Sleep Medicine

## 2023-07-19 ENCOUNTER — Telehealth: Payer: Self-pay | Admitting: Sleep Medicine

## 2023-07-19 VITALS — BP 146/96 | HR 92 | Temp 97.6°F | Ht 62.0 in | Wt 332.0 lb

## 2023-07-19 DIAGNOSIS — J45901 Unspecified asthma with (acute) exacerbation: Secondary | ICD-10-CM | POA: Insufficient documentation

## 2023-07-19 DIAGNOSIS — D649 Anemia, unspecified: Secondary | ICD-10-CM | POA: Diagnosis not present

## 2023-07-19 DIAGNOSIS — Z86711 Personal history of pulmonary embolism: Secondary | ICD-10-CM | POA: Diagnosis not present

## 2023-07-19 DIAGNOSIS — R0602 Shortness of breath: Secondary | ICD-10-CM

## 2023-07-19 DIAGNOSIS — E662 Morbid (severe) obesity with alveolar hypoventilation: Secondary | ICD-10-CM

## 2023-07-19 DIAGNOSIS — G4733 Obstructive sleep apnea (adult) (pediatric): Secondary | ICD-10-CM

## 2023-07-19 LAB — COMPREHENSIVE METABOLIC PANEL WITH GFR
ALT: 29 U/L (ref 0–44)
AST: 16 U/L (ref 15–41)
Albumin: 3.7 g/dL (ref 3.5–5.0)
Alkaline Phosphatase: 79 U/L (ref 38–126)
Anion gap: 10 (ref 5–15)
BUN: 17 mg/dL (ref 8–23)
CO2: 28 mmol/L (ref 22–32)
Calcium: 8.7 mg/dL — ABNORMAL LOW (ref 8.9–10.3)
Chloride: 97 mmol/L — ABNORMAL LOW (ref 98–111)
Creatinine, Ser: 0.92 mg/dL (ref 0.44–1.00)
GFR, Estimated: >60 mL/min (ref >60)
Glucose, Bld: 128 mg/dL — ABNORMAL HIGH (ref 70–99)
Potassium: 3.2 mmol/L — ABNORMAL LOW (ref 3.5–5.1)
Sodium: 135 mmol/L (ref 135–145)
Total Bilirubin: 1 mg/dL (ref 0.0–1.2)
Total Protein: 7.5 g/dL (ref 6.5–8.1)

## 2023-07-19 LAB — CBC WITH DIFFERENTIAL/PLATELET
Abs Immature Granulocytes: 0.04 10*3/uL (ref 0.00–0.07)
Basophils Absolute: 0 10*3/uL (ref 0.0–0.1)
Basophils Relative: 0 %
Eosinophils Absolute: 0.2 10*3/uL (ref 0.0–0.5)
Eosinophils Relative: 3 %
HCT: 43.3 % (ref 36.0–46.0)
Hemoglobin: 13.4 g/dL (ref 12.0–15.0)
Immature Granulocytes: 1 %
Lymphocytes Relative: 21 %
Lymphs Abs: 1.5 10*3/uL (ref 0.7–4.0)
MCH: 29.1 pg (ref 26.0–34.0)
MCHC: 30.9 g/dL (ref 30.0–36.0)
MCV: 93.9 fL (ref 80.0–100.0)
Monocytes Absolute: 0.5 10*3/uL (ref 0.1–1.0)
Monocytes Relative: 7 %
Neutro Abs: 4.7 10*3/uL (ref 1.7–7.7)
Neutrophils Relative %: 68 %
Platelets: 209 10*3/uL (ref 150–400)
RBC: 4.61 MIL/uL (ref 3.87–5.11)
RDW: 14.3 % (ref 11.5–15.5)
WBC: 6.9 10*3/uL (ref 4.0–10.5)
nRBC: 0 % (ref 0.0–0.2)

## 2023-07-19 LAB — NITRIC OXIDE: Nitric Oxide: 11

## 2023-07-19 LAB — D-DIMER, QUANTITATIVE: D-Dimer, Quant: 3.5 ug{FEU}/mL — ABNORMAL HIGH (ref 0.00–0.50)

## 2023-07-19 MED ORDER — TRELEGY ELLIPTA 100-62.5-25 MCG/ACT IN AEPB: 1.0000 | INHALATION_SPRAY | Freq: Every day | RESPIRATORY_TRACT | 0 refills | Status: AC

## 2023-07-19 MED ORDER — AZITHROMYCIN 250 MG PO TABS: ORAL_TABLET | ORAL | 0 refills | Status: AC

## 2023-07-19 MED ORDER — METHYLPREDNISOLONE 4 MG PO TBPK
ORAL_TABLET | ORAL | 0 refills | Status: DC
Start: 2023-07-19 — End: 2023-08-28

## 2023-07-19 NOTE — Telephone Encounter (Signed)
 Scheduled with wrong provider. Called patient and [facility] to reschedule appointment.

## 2023-07-19 NOTE — Progress Notes (Signed)
 Subjective:    [name], female    DOB: January 02, 1961, 63 y.o.   MRN: [id number]  Patient Care Team: Smitty Cords, DO as PCP - [facility])  Chief Complaint  Patient presents with   Consult    Cough, shortness of breath on exertion. Occasional wheezing.     BACKGROUND: Very complex 62 year old woman with a history as noted below who presents for evaluation of dyspnea on exertion and cough as well as wheezing.  She has had symptoms since January.  Previously seen by Dr. Levon Hedger at our office in [place] in 2021.  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   HPI Discussed the use of AI scribe software for clinical note transcription with the patient, who gave verbal consent to proceed.  History of Present Illness   Katherine Scott is a 63 year old female with sleep apnea and a history of pulmonary emboli status post thrombectomy in the past, who presents with shortness of breath.  She has been experiencing worsening shortness of breath since 2020-2021, with exacerbations triggered by colds or infections. Since January 2025, her symptoms have persisted despite antibiotic treatment. She coughs up clear sputum and feels tired, with these symptoms significantly impacting her daily life. No fever is present, but she experiences chills.  In 2023, she had submassive PE and underwent a thrombectomy.  She has not had recent echocardiogram to rule out potential pulmonary hypertension as a cause of her current symptoms.  She is on Eliquis.  She follows with vascular surgery still after thrombectomy.  She has atrial fibrillation, which has been worsening over the years.  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  Her sleep apnea is managed with a CPAP machine (AutoSet 5 to 20 cm H2O), last checked in 2023. She has had two formal sleep studies, and her CPAP is  provided by [employer].  She has used Veterinary surgeon in the past, both of which were helpful, but insurance issues have affected her ability to continue these medications. She currently uses an albuterol inhaler, which provides relief for her wheezing.  She has cellulitis in her legs, which sometimes causes chills. Her weight has been decreasing slightly over recent doctor visits, with no rapid fluid retention noted.  Her BMI is 60.72.  Not had any chest pain, orthopnea or paroxysmal nocturnal dyspnea.  No lower extremity edema.  No cough or sputum production.  Occasional wheezing noted.     Review of Systems A 10 point review of systems was performed and it is as noted above otherwise negative.   Past Medical History:  Diagnosis Date   Abnormal mammogram, unspecified 2013   Atrial fibrillation (HCC)    2013   Breast screening, unspecified    CHF (congestive heart failure) (HCC)    COPD (chronic obstructive pulmonary disease) (HCC)    History of continuous positive airway pressure (CPAP) therapy at home    Hypothyroidism    Obesity, unspecified    Pneumonia    Screening for obesity    Sleep apnea    Special screening for malignant neoplasms, colon    Unspecified essential hypertension     Past Surgical History:  Procedure Laterality Date   ABDOMINAL HYSTERECTOMY  2011   ANTERIOR CRUCIATE LIGAMENT REPAIR  2003   BREAST BIOPSY Left January 22, 2012   left breast stereotactic  biopsy showed fibroadenomatous changes with microcalcifications, fat necrosis, and sclerosing adenosis and columnar cell changes. No evidence of atypia or malignancy.   CARDIOVERSION N/A 09/22/2016   Procedure: CARDIOVERSION;  Surgeon: Dalia Heading, MD;  Location: [facility] ORS;  Service: Cardiovascular;  Laterality: N/A;   CARDIOVERSION N/A 01/17/2018   Procedure: CARDIOVERSION;  Surgeon: Dalia Heading, MD;  Location: [facility] OR[facility];  Service: Cardiovascular;  Laterality: N/A;   CARDIOVERSION N/A 01/09/2019    Procedure: CARDIOVERSION;  Surgeon: Dalia Heading, MD;  Location: [facility] OR[facility];  Service: Cardiovascular;  Laterality: N/A;   CESAREAN SECTION  1985, 1987, and 1998   COLONOSCOPY  2011   Dr. Mechele Collin   ELECTROPHYSIOLOGIC STUDY N/A 12/03/2014   Procedure: Cardioversion;  Surgeon: Dalia Heading, MD;  Location: [facility] OR[facility];  Service: Cardiovascular;  Laterality: N/A;   ELECTROPHYSIOLOGIC STUDY N/A 04/01/2015   Procedure: Cardioversion;  Surgeon: Dalia Heading, MD;  Location: [facility];  Service: Cardiovascular;  Laterality: N/A;   KNEE SURGERY  2013   PULMONARY THROMBECTOMY Bilateral 07/15/2021   Procedure: PULMONARY THROMBECTOMY;  Surgeon: Annice Needy, MD;  Location: [facility];  Service: Cardiovascular;  Laterality: Bilateral;    Patient Active Problem List   Diagnosis Date Noted   Centrilobular emphysema (HCC) 11/21/2021   OSA (obstructive sleep apnea) 07/16/2021   Pulmonary embolism with acute cor pulmonale (HCC) 07/15/2021   Chronic atrial fibrillation with RVR (HCC) 07/15/2021   Chronic systolic CHF (congestive heart failure) (HCC) 07/15/2021   Elevated troponin 07/15/2021   HTN (hypertension) 07/15/2021   Closed fracture of proximal end of left humerus 04/15/2020   Shortness of breath 02/12/2020   Venous ulcer (HCC) 10/09/2019   Lymphedema 02/26/2017   Recurrent cellulitis of lower extremity 01/20/2017   Acquired hypothyroidism 08/11/2016   BMI 60.0-69.9, adult (HCC) 08/11/2016   Persistent atrial fibrillation (HCC) 06/17/2014    Family History  Problem Relation Age of Onset   Colon cancer Mother        diagnosis age 57's   Colon cancer Sister        diagnosis age 73   Atrial fibrillation Sister    Breast cancer Other        Maternal Grandmother; diagnosis age 12's   Lung cancer Other        Paternal Grandmother; diagnosis age 39's   Colon cancer Other        Paternal Grandfather; diagnosis age 69's   Heart disease Father     Social History   Tobacco Use    Smoking status: Never    Passive exposure: Yes   Smokeless tobacco: Never  Substance Use Topics   Alcohol use: No    Allergies  Allergen Reactions   Amiodarone     Caused toxicity     Current Meds  Medication Sig   albuterol (VENTOLIN HFA) 108 (90 Base) MCG/ACT inhaler Inhale 2 puffs into the lungs every 4 (four) hours as needed for wheezing.   apixaban (ELIQUIS) 5 MG TABS tablet Take 1 tablet (5 mg total) by mouth 2 (two) times daily.   azithromycin (ZITHROMAX) 250 MG tablet Take 2 tablets (500 mg) on  Day 1,  followed by 1 tablet (250 mg) once daily on Days 2 through 5.   ciprofloxacin (CIPRO) 500 MG tablet Take 1 tablet (500 mg total) by mouth 2 (two) times daily.   diltiazem (DILACOR XR) 240 MG 24 hr capsule Take 240 mg by mouth daily.   Fluticasone-Umeclidin-Vilant (TRELEGY ELLIPTA) 100-62.5-25 MCG/ACT  AEPB Inhale 1 puff into the lungs daily in the afternoon.   furosemide (LASIX) 40 MG tablet Take 2 tablets (80 mg total) by mouth daily.   ipratropium (ATROVENT) 0.06 % nasal spray Place 2 sprays into both nostrils 4 (four) times daily.   methylPREDNISolone (MEDROL DOSEPAK) 4 MG TBPK tablet Take as directed in the package.  This is a taper pack.   potassium chloride SA (KLOR-CON M) 20 MEQ tablet Take 1 tablet (20 mEq total) by mouth daily.   promethazine-dextromethorphan (PROMETHAZINE-DM) 6.25-15 MG/5ML syrup Take 5 mLs by mouth 4 (four) times daily as needed.   Spacer/Aero-Holding Chambers DEVI 1 Device by Does not apply route as needed.   TIADYLT ER 240 MG 24 hr capsule Take 240 mg by mouth daily.   traMADol (ULTRAM) 50 MG tablet Take 1 tablet (50 mg total) by mouth every 6 (six) hours as needed.   [DISCONTINUED] predniSONE (STERAPRED UNI-PAK 21 TAB) 10 MG (21) TBPK tablet Take by mouth daily. Take 6 tabs by mouth daily  for 2 days, then 5 tabs for 2 days, then 4 tabs for 2 days, then 3 tabs for 2 days, 2 tabs for 2 days, then 1 tab by mouth daily for 2 days    Immunization  History  Administered Date(s) Administered   Influenza,inj,Quad PF,6+ Mos 02/04/2019, 02/20/2020   PFIZER(Purple Top)SARS-COV-2 Vaccination 08/01/2019, 09/03/2019        Objective:     BP (!) 146/96 (BP Location: Left Wrist, Patient Position: Sitting, Cuff Size: Normal)   Pulse 92   Temp 97.6 F (36.4 C) (Temporal)   Ht 5\' 2"  (1.575 m)   Wt (!) 332 lb (150.6 kg)   SpO2 96%   BMI 60.72 kg/m   SpO2: 96 %  GENERAL: Obese woman, no acute distress, ambulatory with assistance of a cane.  Mild conversational dyspnea. HEAD: Normocephalic, atraumatic.  EYES: Pupils equal, round, reactive to light.  No scleral icterus.  MOUTH: Few chipped teeth, oral mucosa moist.  No thrush. NECK: Supple. No thyromegaly. Trachea midline. No JVD.  No adenopathy. PULMONARY: Good air entry bilaterally.  Faint end expiratory wheezing throughout.  No rales.  No rhonchi. CARDIOVASCULAR: S1 and S2. Regular rate and rhythm.  ABDOMEN: Obese. MUSCULOSKELETAL: No joint deformity, no clubbing, no edema.  No LE asymmetry.  No calf tenderness. NEUROLOGIC: No overt focal deficit, gait supported by cane, speech is fluent. SKIN: Intact,warm,dry.  Chronic stasis changes bilaterally. PSYCH: Mood and behavior normal.  Chest x-ray performed to April 2025 [facility] [place] [facility] [place], independently reviewed, no acute cardiopulmonary abnormality:    Wells criteria for pulmonary embolism: Low risk with 1.3% chance of PE.  Lab Results  Component Value Date   NITRICOXIDE 11 07/19/2023  *No evidence of type II (eosinophilic) inflammation.   Assessment & Plan:     ICD-10-CM   1. Shortness of breath  R06.02 Pulmonary function test    ECHOCARDIOGRAM COMPLETE    Comp Met (CMET)    D-Dimer, Quantitative    Nitric oxide    2. Persistent asthma with acute exacerbation, unspecified asthma severity  J45.901 Pulmonary function test    CBC with Differential/Platelet    Allergen Panel (27) + IGE    3. History of  pulmonary embolism  Z86.711 D-Dimer, Quantitative    4. Anemia, unspecified type  D64.9 CBC with Differential/Platelet    5. Obesity with alveolar hypoventilation and body mass index (BMI) of 40 or greater (HCC)  E66.2     6.  OSA on CPAP  G47.33       Orders Placed This Encounter  Procedures   CBC with Differential/Platelet    Standing Status:   Future    Number of Occurrences:   1    Expiration Date:   07/18/2024   Allergen Panel (27) + IGE    Standing Status:   Future    Number of Occurrences:   1    Expiration Date:   07/18/2024   Comp Met (CMET)    Standing Status:   Future    Number of Occurrences:   1    Expiration Date:   07/18/2024   D-Dimer, Quantitative    Standing Status:   Future    Number of Occurrences:   1    Expiration Date:   07/18/2024   Nitric oxide   ECHOCARDIOGRAM COMPLETE    Standing Status:   Future    Expiration Date:   07/18/2024    Where should this test be performed:   [facility]    Perflutren DEFINITY (image enhancing agent) should be administered unless hypersensitivity or allergy exist:   Administer Perflutren    Reason for exam-Echo:   Dyspnea  R06.00   Pulmonary function test    Standing Status:   Future    Expiration Date:   07/18/2024    Where should this test be performed?:   Outpatient [facility]    What type of PFT is being ordered?:   Full PFT    MIP/MEP:   No    Meds ordered this encounter  Medications   Fluticasone-Umeclidin-Vilant (TRELEGY ELLIPTA) 100-62.5-25 MCG/ACT AEPB    Sig: Inhale 1 puff into the lungs daily in the afternoon.    Dispense:  2 each    Refill:  0    Lot Number?:   [id number]    Expiration Date?:   11/08/2024    Manufacturer?:   [employer] [[id number]]    NDC:   1610-9604-54 [372260]    Quantity:   2   azithromycin (ZITHROMAX) 250 MG tablet    Sig: Take 2 tablets (500 mg) on  Day 1,  followed by 1 tablet (250 mg) once daily on Days 2 through 5.    Dispense:  6 each    Refill:  0   methylPREDNISolone (MEDROL  DOSEPAK) 4 MG TBPK tablet    Sig: Take as directed in the package.  This is a taper pack.    Dispense:  21 tablet    Refill:  0   Discussion:    Shortness of breath Persistent shortness of breath since January, exacerbated by colds and leading to infections. History of pulmonary embolism and thrombectomy in 2023. Wheezing present. Differential includes asthma, pulmonary hypertension, and sleep apnea complications. Prednisone and antibiotics considered for inflammation and infection. 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 Previous use of Breztri and Trelegy with good effect but insurance issues noted. Current wheezing and shortness of breath indicate active asthma. Prednisone and Trelegy samples provided for symptom management. - Provide samples of Trelegy - Prescribe prednisone taper pack - Prescribe antibiotic with anti-inflammatory properties (azithromycin) - Schedule formal pulmonary function tests - CBC with differential, allergen  panel  Obstructive sleep apnea Obstructive sleep apnea managed with CPAP, last checked in 2023. Concerns about inadequate management contributing to shortness of breath. Referral to Dr. Betti Cruz for specialized management planned. - Refer to Dr. Betti Cruz for sleep apnea management - Check CPAP compliance and settings: Compliance over 30 days 90%.  Usage over 4 hours 57%.  Residual AHI 0.9.  Pulmonary embolism Significant pulmonary embolism in 2023 requiring thrombectomy. Assessment for pulmonary hypertension as a potential cause of current symptoms is  necessary. Echocardiogram planned to evaluate pulmonary artery pressure. - Order echocardiogram to assess for pulmonary hypertension  Anemia Last CBC 1 year ago H&H was 10.8/35.1, has not been checked since.  Anemia can cause dyspnea. - Check CBC  Follow-up Follow-up required to assess treatment response and manage conditions. Coordination with vein specialist for annual check-up noted. - Schedule follow-up appointment in 4-6 weeks - Coordinate with vein specialist for annual check-up     Advised if symptoms do not improve or worsen, to please contact office for sooner follow up or seek emergency care.    I spent 62 minutes of dedicated to the care of this patient on the date of this encounter to include pre-visit review of records, face-to-face time with the patient discussing conditions above, post visit ordering of testing, clinical documentation with the electronic health record, making appropriate referrals as documented, and communicating necessary findings to members of the patients care team.   C. Danice Goltz, MD Advanced [facility]ch[facility]    *This note was dictated using voice recognition software/Dragon.  Despite best efforts to proofread, errors can occur which can change the meaning. Any transcriptional errors that result from this process are unintentional and may not be fully corrected at the time of dictation.

## 2023-07-19 NOTE — Patient Instructions (Addendum)
 VISIT SUMMARY:  During your visit, we discussed your ongoing shortness of breath, which has been worsening since 2020-2021 and has persisted despite recent antibiotic treatment. We reviewed your history of lung clots, atrial fibrillation, and sleep apnea. We also addressed your current medications and the impact of insurance issues on your asthma management.  YOUR PLAN:  -SHORTNESS OF BREATH: Your shortness of breath has been persistent and worsens with colds and infections. We are considering several potential causes, including asthma, pulmonary hypertension, and complications from sleep apnea. We will conduct an echocardiogram to check for pulmonary hypertension, perform pulmonary function tests, and review your CPAP settings. You will also start a prednisone taper pack and an antibiotic with anti-inflammatory properties. We provided samples of Trelegy to help manage your asthma symptoms.  -ASTHMA: Asthma is a condition where your airways become inflamed and narrow, causing difficulty in breathing. 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 We provided samples of Trelegy and prescribed a prednisone taper pack and an antibiotic with anti-inflammatory properties to help manage your symptoms. We will also schedule formal pulmonary function tests.  -OBSTRUCTIVE SLEEP APNEA: Obstructive sleep apnea is a condition where your breathing repeatedly stops and starts during sleep. Your condition is managed with a CPAP machine, but we need to ensure it is working effectively. We will check your CPAP compliance and settings and refer you to Dr. Betti Cruz for specialized management.  -PULMONARY EMBOLISM: A pulmonary embolism is a blockage in one of the pulmonary arteries in your lungs, which you experienced in 2023. We need to assess for pulmonary hypertension as a potential cause of your current symptoms. An echocardiogram will be conducted to evaluate your pulmonary  artery pressure.  A D-dimer will be checked  with blood work  -ANEMIA: You had evidence of anemia a year ago.  We will check on this as this can also cause shortness of breath.  INSTRUCTIONS:  Please schedule a follow-up appointment in 4-6 weeks to assess your treatment response and manage your conditions. Additionally, coordinate with your vein specialist for your annual check-up.

## 2023-07-20 ENCOUNTER — Ambulatory Visit (INDEPENDENT_AMBULATORY_CARE_PROVIDER_SITE_OTHER): Admitting: Nurse Practitioner

## 2023-07-20 ENCOUNTER — Encounter (INDEPENDENT_AMBULATORY_CARE_PROVIDER_SITE_OTHER): Payer: Self-pay | Admitting: Nurse Practitioner

## 2023-07-20 VITALS — BP 166/92 | HR 84 | Resp 20 | Wt 329.0 lb

## 2023-07-20 DIAGNOSIS — I10 Essential (primary) hypertension: Secondary | ICD-10-CM | POA: Diagnosis not present

## 2023-07-20 DIAGNOSIS — L03119 Cellulitis of unspecified part of limb: Secondary | ICD-10-CM | POA: Diagnosis not present

## 2023-07-20 DIAGNOSIS — I890 Lymphedema, not elsewhere classified: Secondary | ICD-10-CM | POA: Diagnosis not present

## 2023-07-23 ENCOUNTER — Telehealth: Payer: Self-pay

## 2023-07-23 ENCOUNTER — Encounter (INDEPENDENT_AMBULATORY_CARE_PROVIDER_SITE_OTHER): Payer: Self-pay | Admitting: Nurse Practitioner

## 2023-07-23 NOTE — Progress Notes (Signed)
 Subjective:    [name], female    DOB: 1960-06-15, 63 y.o.   MRN: [id number] Chief Complaint  Patient presents with   Follow-up    Unna follow up    Today the patient presents today for evaluation after being in Unna boots for several weeks due to swelling and cellulitis.  The cellulitis has resolved.  She currently has no open wounds or ulcerations.  There is currently no weeping or evidence of cellulitis noted today.  She tolerated the Unna boots well.    Review of Systems  Cardiovascular:  Positive for leg swelling.  All other systems reviewed and are negative.      Objective:   Physical Exam Vitals reviewed.  HENT:     Head: Normocephalic.  Cardiovascular:     Rate and Rhythm: Normal rate.  Pulmonary:     Effort: Pulmonary effort is normal.  Musculoskeletal:     Right lower leg: 1+ Edema present.     Left lower leg: 1+ Edema present.  Skin:    General: Skin is warm and dry.  Neurological:     Mental Status: She is alert and oriented to person, place, and time.  Psychiatric:        Mood and Affect: Mood normal.        Behavior: Behavior normal.        Thought Content: Thought content normal.        Judgment: Judgment normal.     BP (!) 166/92   Pulse 84   Resp 20   Wt (!) 329 lb (149.2 kg)   BMI 60.17 kg/m   Past Medical History:  Diagnosis Date   Abnormal mammogram, unspecified 2013   Atrial fibrillation (HCC)    2013   Breast screening, unspecified    CHF (congestive heart failure) (HCC)    COPD (chronic obstructive pulmonary disease) (HCC)    History of continuous positive airway pressure (CPAP) therapy at home    Hypothyroidism    Obesity, unspecified    Pneumonia    Screening for obesity    Sleep apnea    Special screening for malignant neoplasms, colon    Unspecified essential hypertension     Social History   Socioeconomic History   Marital status: Divorced    Spouse name: Not on file   Number of children: Not on  file   Years of education: Not on file   Highest education level: Not on file  Occupational History   Not on file  Tobacco Use   Smoking status: Never    Passive exposure: Yes   Smokeless tobacco: Never  Vaping Use   Vaping status: Never Used  Substance and Sexual Activity   Alcohol use: No   Drug use: No   Sexual activity: Not on file  Other Topics Concern   Not on file  Social History Narrative   Not on file   Social Drivers of Health   Financial Resource Strain: Medium Risk (02/09/2023)   Overall Financial Resource Strain (CARDIA)    Difficulty of Paying Living Expenses: Somewhat hard  Food Insecurity: No Food Insecurity (02/09/2023)   Hunger Vital Sign    Worried About Running Out of Food in the Last Year: Never true    Ran Out of Food in the Last Year: Never true  Transportation Needs: No Transportation Needs (02/09/2023)   PRAPARE - Administrator, Civil Service (Medical): No    Lack of Transportation (Non-Medical):  No  Physical Activity: Inactive (02/09/2023)   Exercise Vital Sign    Days of Exercise per Week: 0 days    Minutes of Exercise per Session: 0 min  Stress: No Stress Concern Present (02/09/2023)   [employer] of Occupational Health - Occupational Stress Questionnaire    Feeling of Stress : Only a little  Social Connections: Moderately Integrated (02/09/2023)   Social Connection and Isolation Panel [NHANES]    Frequency of Communication with Friends and Family: More than three times a week    Frequency of Social Gatherings with Friends and Family: Three times a week    Attends Religious Services: More than 4 times per year    Active Member of Clubs or Organizations: Yes    Attends Banker Meetings: More than 4 times per year    Marital Status: Divorced  Intimate Partner Violence: Not At Risk (02/09/2023)   Humiliation, Afraid, Rape, and Kick questionnaire    Fear of Current or Ex-Partner: No    Emotionally Abused: No     Physically Abused: No    Sexually Abused: No    Past Surgical History:  Procedure Laterality Date   ABDOMINAL HYSTERECTOMY  2011   ANTERIOR CRUCIATE LIGAMENT REPAIR  2003   BREAST BIOPSY Left January 22, 2012   left breast stereotactic biopsy showed fibroadenomatous changes with microcalcifications, fat necrosis, and sclerosing adenosis and columnar cell changes. No evidence of atypia or malignancy.   CARDIOVERSION N/A 09/22/2016   Procedure: CARDIOVERSION;  Surgeon: Dalia Heading, MD;  Location: [facility];  Service: Cardiovascular;  Laterality: N/A;   CARDIOVERSION N/A 01/17/2018   Procedure: CARDIOVERSION;  Surgeon: Dalia Heading, MD;  Location: [facility];  Service: Cardiovascular;  Laterality: N/A;   CARDIOVERSION N/A 01/09/2019   Procedure: CARDIOVERSION;  Surgeon: Dalia Heading, MD;  Location: [facility];  Service: Cardiovascular;  Laterality: N/A;   CESAREAN SECTION  1985, 1987, and 1998   COLONOSCOPY  2011   Dr. Mechele Collin   ELECTROPHYSIOLOGIC STUDY N/A 12/03/2014   Procedure: Cardioversion;  Surgeon: Dalia Heading, MD;  Location: [facility];  Service: Cardiovascular;  Laterality: N/A;   ELECTROPHYSIOLOGIC STUDY N/A 04/01/2015   Procedure: Cardioversion;  Surgeon: Dalia Heading, MD;  Location: [facility];  Service: Cardiovascular;  Laterality: N/A;   KNEE SURGERY  2013   PULMONARY THROMBECTOMY Bilateral 07/15/2021   Procedure: PULMONARY THROMBECTOMY;  Surgeon: Annice Needy, MD;  Location: [facility];  Service: Cardiovascular;  Laterality: Bilateral;    Family History  Problem Relation Age of Onset   Colon cancer Mother        diagnosis age 65's   Colon cancer Sister        diagnosis age 63   Atrial fibrillation Sister    Breast cancer Other        Maternal Grandmother; diagnosis age 34's   Lung cancer Other        Paternal Grandmother; diagnosis age 17's   Colon cancer Other        Paternal Grandfather; diagnosis age 48's   Heart disease Father     Allergies   Allergen Reactions   Amiodarone     Caused toxicity        Latest Ref Rng & Units 07/19/2023   11:36 AM 07/21/2021    5:50 AM 07/20/2021    5:08 AM  CBC  WBC 4.0 - 10.5 K/uL 6.9  8.3  9.0   Hemoglobin 12.0 - 15.0 g/dL 13.4  10.8  10.9   Hematocrit 36.0 - 46.0 % 43.3  35.1  35.8   Platelets 150 - 400 K/uL 209  188  160       CMP     Component Value Date/Time   NA 135 07/19/2023 1136   NA 141 11/29/2011 0432   K 3.2 (L) 07/19/2023 1136   K 4.1 11/29/2011 0432   CL 97 (L) 07/19/2023 1136   CL 106 11/29/2011 0432   CO2 28 07/19/2023 1136   CO2 30 11/29/2011 0432   GLUCOSE 128 (H) 07/19/2023 1136   GLUCOSE 121 (H) 11/29/2011 0432   BUN 17 07/19/2023 1136   BUN 11 11/29/2011 0432   CREATININE 0.92 07/19/2023 1136   CREATININE 0.90 01/19/2017 0925   CALCIUM 8.7 (L) 07/19/2023 1136   CALCIUM 8.2 (L) 11/29/2011 0432   PROT 7.5 07/19/2023 1136   ALBUMIN 3.7 07/19/2023 1136   AST 16 07/19/2023 1136   ALT 29 07/19/2023 1136   ALKPHOS 79 07/19/2023 1136   BILITOT 1.0 07/19/2023 1136   GFRNONAA >60 07/19/2023 1136   GFRNONAA >60 11/29/2011 0432     No results found.     Assessment & Plan:   1. Lymphedema (Primary) Currently the patient's cellulitis and edema are doing better.  She will return to utilizing her medical grade compression.  Elevation is also strongly recommended with activity.  She plans on swimming this year and this is shown to have great improvement with her lymphedema.  Also discussed lymphedema pump but she is not ready to move forward with this as of yet.  Will have her return in 6 months or sooner if issues arise.  2. Recurrent cellulitis of lower extremity Currently resolved  3. Primary hypertension Continue antihypertensive medications as already ordered, these medications have been reviewed and there are no changes at this time.   Current Outpatient Medications on File Prior to Visit  Medication Sig Dispense Refill   albuterol (VENTOLIN HFA)  108 (90 Base) MCG/ACT inhaler Inhale 2 puffs into the lungs every 4 (four) hours as needed for wheezing. 6.7 each 5   apixaban (ELIQUIS) 5 MG TABS tablet Take 1 tablet (5 mg total) by mouth 2 (two) times daily. 60 tablet 0   azithromycin (ZITHROMAX) 250 MG tablet Take 2 tablets (500 mg) on  Day 1,  followed by 1 tablet (250 mg) once daily on Days 2 through 5. 6 each 0   ciprofloxacin (CIPRO) 500 MG tablet Take 1 tablet (500 mg total) by mouth 2 (two) times daily. 20 tablet 0   diltiazem (DILACOR XR) 240 MG 24 hr capsule Take 240 mg by mouth daily.     Fluticasone-Umeclidin-Vilant (TRELEGY ELLIPTA) 100-62.5-25 MCG/ACT AEPB Inhale 1 puff into the lungs daily in the afternoon. 2 each 0   furosemide (LASIX) 40 MG tablet Take 2 tablets (80 mg total) by mouth daily. 180 tablet 3   ipratropium (ATROVENT) 0.06 % nasal spray Place 2 sprays into both nostrils 4 (four) times daily. 15 mL 12   methylPREDNISolone (MEDROL DOSEPAK) 4 MG TBPK tablet Take as directed in the package.  This is a taper pack. 21 tablet 0   potassium chloride SA (KLOR-CON M) 20 MEQ tablet Take 1 tablet (20 mEq total) by mouth daily. 90 tablet 3   promethazine-dextromethorphan (PROMETHAZINE-DM) 6.25-15 MG/5ML syrup Take 5 mLs by mouth 4 (four) times daily as needed. 118 mL 0   Spacer/Aero-Holding Chambers DEVI 1 Device by Does not apply route as needed. 1  each 0   TIADYLT ER 240 MG 24 hr capsule Take 240 mg by mouth daily.     traMADol (ULTRAM) 50 MG tablet Take 1 tablet (50 mg total) by mouth every 6 (six) hours as needed. 30 tablet 0   benzonatate (TESSALON) 100 MG capsule Take 2 capsules (200 mg total) by mouth every 8 (eight) hours. (Patient not taking: Reported on 07/20/2023) 21 capsule 0   BREZTRI AEROSPHERE 160-9-4.8 MCG/ACT AERO Inhale 2 puffs into the lungs 2 (two) times daily. (Patient not taking: Reported on 07/20/2023) 10.7 g 11   No current facility-administered medications on file prior to visit.    There are no Patient  Instructions on file for this visit. No follow-ups on file.   Avalina Benko E Mylez Venable, NP

## 2023-07-23 NOTE — Telephone Encounter (Signed)
 Copied from CRM [id number]. Topic: Appointments - Scheduling Inquiry for Clinic >> Jul 23, 2023  4:40 PM Alverda Joe S wrote: Reason for CRM: patient calling to schedule echocardiogram, please call patient at [phone]

## 2023-07-24 NOTE — Telephone Encounter (Signed)
 I have spoke with Katherine Scott and her echo has been scheduled on 08/20/23 @ 9:00am at [facility] and she is aware

## 2023-07-25 ENCOUNTER — Ambulatory Visit (INDEPENDENT_AMBULATORY_CARE_PROVIDER_SITE_OTHER): Payer: Medicare HMO | Admitting: Nurse Practitioner

## 2023-07-26 ENCOUNTER — Encounter: Payer: Self-pay | Admitting: Sleep Medicine

## 2023-07-26 ENCOUNTER — Ambulatory Visit: Admitting: Sleep Medicine

## 2023-07-26 VITALS — BP 130/80 | HR 103 | Temp 98.5°F | Ht 63.0 in | Wt 333.4 lb

## 2023-07-26 DIAGNOSIS — I4891 Unspecified atrial fibrillation: Secondary | ICD-10-CM

## 2023-07-26 DIAGNOSIS — I83009 Varicose veins of unspecified lower extremity with ulcer of unspecified site: Secondary | ICD-10-CM

## 2023-07-26 DIAGNOSIS — G4733 Obstructive sleep apnea (adult) (pediatric): Secondary | ICD-10-CM

## 2023-07-26 DIAGNOSIS — I5023 Acute on chronic systolic (congestive) heart failure: Secondary | ICD-10-CM

## 2023-07-26 NOTE — Patient Instructions (Signed)

## 2023-07-26 NOTE — Progress Notes (Signed)
 Name:Katherine Scott MRN: [id number] DOB: [birth year]   CHIEF COMPLAINT:  ESTABLISH CARE FOR OSA   HISTORY OF PRESENT ILLNESS:  Katherine Scott is a 63 y.o. w/ a h/o OSA, HTN, atrial fibrillation, asthma and morbid obesity who presents to establish care for OSA. Reports that she was initially diagnosed with OSA in 2013 and was subsequently started on CPAP therapy. Reports using CPAP therapy almost every night, which is confirmed by compliance data. Over the last month, she states that she has been falling asleep in the living, since recovering from a URI. Reports snoring without CPAP therapy. Reports nocturnal awakenings due to unclear reasons, however does not have difficulty falling back to sleep. Denies any significant weight changes. Denies morning headaches, RLS symptoms, dream enactment, cataplexy, hypnagogic or hypnapompic hallucinations. Reports a family history of sleep apnea. Denies drowsy driving. Drinks 1 cup of coffee daily and soda occasionally, denies alcohol, tobacco or illicit drug use.   Bedtime 11 pm-12 am Sleep onset 15 mins Rise time 7:15 am   EPWORTH SLEEP SCORE 11    07/26/2023   11:00 AM  Results of the Epworth flowsheet  Sitting and reading 3  Watching TV 2  Sitting, inactive in a public place (e.g. a theatre or a meeting) 1  As a passenger in a car for an hour without a break 1  Lying down to rest in the afternoon when circumstances permit 3  Sitting and talking to someone 0  Sitting quietly after a lunch without alcohol 1  In a car, while stopped for a few minutes in traffic 0  Total score 11    PAST MEDICAL HISTORY :   has a past medical history of Abnormal mammogram, unspecified (2013), Atrial fibrillation (HCC), Breast screening, unspecified, CHF (congestive heart failure) (HCC), COPD (chronic obstructive pulmonary disease) (HCC), History of continuous positive airway pressure (CPAP) therapy at home, Hypothyroidism, Obesity, unspecified, Pneumonia,  Screening for obesity, Sleep apnea, Special screening for malignant neoplasms, colon, and Unspecified essential hypertension.  has a past surgical history that includes Colonoscopy (2011); Abdominal hysterectomy (2011); Anterior cruciate ligament repair (2003); Knee surgery (2013); Cesarean section (1985, 1987, and 1998); Breast biopsy (Left, January 22, 2012); Cardiac catheterization (N/A, 12/03/2014); Cardiac catheterization (N/A, 04/01/2015); CARDIOVERSION (N/A, 09/22/2016); CARDIOVERSION (N/A, 01/17/2018); Cardioversion (N/A, 01/09/2019); and PULMONARY THROMBECTOMY (Bilateral, 07/15/2021). Prior to Admission medications   Medication Sig Start Date End Date Taking? Authorizing Provider  albuterol (VENTOLIN HFA) 108 (90 Base) MCG/ACT inhaler Inhale 2 puffs into the lungs every 4 (four) hours as needed for wheezing. 07/11/23  Yes Brimage, Vondra, DO  apixaban (ELIQUIS) 5 MG TABS tablet Take 1 tablet (5 mg total) by mouth 2 (two) times daily. 07/25/21  Yes Marrion Coy, MD  ciprofloxacin (CIPRO) 500 MG tablet Take 1 tablet (500 mg total) by mouth 2 (two) times daily. 06/19/23  Yes Georgiana Spinner, NP  diltiazem (DILACOR XR) 240 MG 24 hr capsule Take 240 mg by mouth daily.   Yes [provider]  Fluticasone-Umeclidin-Vilant (TRELEGY ELLIPTA) 100-62.5-25 MCG/ACT AEPB Inhale 1 puff into the lungs daily in the afternoon. 07/19/23 08/18/23 Yes Salena Saner, MD  furosemide (LASIX) 40 MG tablet Take 2 tablets (80 mg total) by mouth daily. 11/21/21  Yes Karamalegos, Alexander J, DO  ipratropium (ATROVENT) 0.06 % nasal spray Place 2 sprays into both nostrils 4 (four) times daily. 04/11/23  Yes Becky Augusta, NP  potassium chloride SA (KLOR-CON M) 20 MEQ tablet Take 1 tablet (20  mEq total) by mouth daily. 08/25/22  Yes Karamalegos, Kayleen Party, DO  Spacer/Aero-Holding Chambers DEVI 1 Device by Does not apply route as needed. 05/24/18  Yes Brenda Calkin, NP  TIADYLT ER 240 MG 24 hr capsule Take 240 mg by  mouth daily. 04/01/23  Yes [provider]  traMADol (ULTRAM) 50 MG tablet Take 1 tablet (50 mg total) by mouth every 6 (six) hours as needed. 09/02/21  Yes Brown, Fallon E, NP  benzonatate (TESSALON) 100 MG capsule Take 2 capsules (200 mg total) by mouth every 8 (eight) hours. Patient not taking: Reported on 07/19/2023 04/11/23   Kent Pear, NP  BREZTRI AEROSPHERE 160-9-4.8 MCG/ACT AERO Inhale 2 puffs into the lungs 2 (two) times daily. Patient not taking: Reported on 07/19/2023 08/25/22   Raina Bunting, DO  methylPREDNISolone (MEDROL DOSEPAK) 4 MG TBPK tablet Take as directed in the package.  This is a taper pack. Patient not taking: Reported on 07/26/2023 07/19/23   Marc Senior, MD  promethazine-dextromethorphan (PROMETHAZINE-DM) 6.25-15 MG/5ML syrup Take 5 mLs by mouth 4 (four) times daily as needed. Patient not taking: Reported on 07/26/2023 07/18/23   Kent Pear, NP   Allergies  Allergen Reactions   Amiodarone     Caused toxicity     FAMILY HISTORY:  family history includes Atrial fibrillation in her sister; Breast cancer in an other family member; Colon cancer in her mother, sister, and another family member; Heart disease in her father; Lung cancer in an other family member. SOCIAL HISTORY:  reports that she has never smoked. She has been exposed to tobacco smoke. She has never used smokeless tobacco. She reports that she does not drink alcohol and does not use drugs.   Review of Systems:  Gen:  Denies  fever, sweats, chills weight loss  HEENT: Denies blurred vision, double vision, ear pain, eye pain, hearing loss, nose bleeds, sore throat Cardiac:  No dizziness, chest pain or heaviness, chest tightness,edema, No JVD Resp:   No cough, -sputum production, -shortness of breath,-wheezing, -hemoptysis,  Gi: Denies swallowing difficulty, stomach pain, nausea or vomiting, diarrhea, constipation, bowel incontinence Gu:  Denies bladder incontinence, burning  urine Ext:   Denies Joint pain, stiffness or swelling Skin: Denies  skin rash, easy bruising or bleeding or hives Endoc:  Denies polyuria, polydipsia , polyphagia or weight change Psych:   Denies depression, insomnia or hallucinations  Other:  All other systems negative  VITAL SIGNS: BP 130/80 (BP Location: Right Arm, Patient Position: Sitting, Cuff Size: Large)   Pulse (!) 103   Temp 98.5 F (36.9 C) (Oral)   Ht 5\' 3"  (1.6 m)   Wt (!) 333 lb 6.4 oz (151.2 kg)   SpO2 97%   BMI 59.06 kg/m    Physical Examination:   General Appearance: No distress  EYES PERRLA, EOM intact.   NECK Supple, No JVD Pulmonary: normal breath sounds, No wheezing.  CardiovascularNormal S1,S2.  No m/r/g.   Abdomen: Benign, Soft, non-tender. Skin:   warm, no rashes, no ecchymosis  Extremities: normal, no cyanosis, clubbing. Neuro:without focal findings,  speech normal  PSYCHIATRIC: Mood, affect within normal limits.   ASSESSMENT AND PLAN  OSA Patient is using and benefiting from CPAP therapy. Counseled patient on increasing CPAP compliance. Discussed the consequences of untreated sleep apnea. Advised not to drive drowsy for safety of patient and others. Will complete further evaluation with a home sleep study and follow up to review results.    Atrial fibrillation Stable, on current  management. Following with cardiology.   Morbid obesity Counseled patient on diet and lifestyle modification.    Patient  satisfied with Plan of action and management. All questions answered  I spent a total of 42 minutes reviewing chart data, face-to-face evaluation with the patient, counseling and coordination of care as detailed above.    Marlaine Arey, M.D.  Sleep Medicine [facility]

## 2023-07-27 LAB — ALLERGEN PANEL (27) + IGE
Alternaria Alternata IgE: 0.2 kU/L — AB
Aspergillus Fumigatus IgE: 0.18 kU/L — AB
Bahia Grass IgE: <0.1 kU/L
Bermuda Grass IgE: 0.11 kU/L — AB
Cat Dander IgE: 0.1 kU/L — AB
Cedar, Mountain IgE: 0.24 kU/L — AB
Cladosporium Herbarum IgE: 0.24 kU/L — AB
Cocklebur IgE: 0.12 kU/L — AB
Cockroach, American IgE: 0.14 kU/L — AB
Common Silver Birch IgE: 0.1 kU/L — AB
D Farinae IgE: 0.15 kU/L — AB
D Pteronyssinus IgE: 0.35 kU/L — AB
Dog Dander IgE: 0.23 kU/L — AB
Elm, American IgE: 0.1 kU/L — AB
Hickory, White IgE: 0.11 kU/L — AB
IgE (Immunoglobulin E), Serum: 20 [IU]/mL (ref 6–495)
Johnson Grass IgE: 0.3 kU/L — AB
Kentucky Bluegrass IgE: <0.1 kU/L
Maple/Box Elder IgE: 0.21 kU/L — AB
Mucor Racemosus IgE: 0.13 kU/L — AB
Oak, White IgE: <0.1 kU/L
Penicillium Chrysogen IgE: 0.16 kU/L — AB
Pigweed, Rough IgE: 0.14 kU/L — AB
Plantain, English IgE: 0.12 kU/L — AB
Ragweed, Short IgE: 0.1 kU/L — AB
Setomelanomma Rostrat: 0.1 kU/L — AB
Timothy Grass IgE: <0.1 kU/L
White Mulberry IgE: 0.31 kU/L — AB

## 2023-08-02 DIAGNOSIS — J45901 Unspecified asthma with (acute) exacerbation: Secondary | ICD-10-CM

## 2023-08-03 NOTE — Telephone Encounter (Signed)
-----   Message from Coralie Derrick sent at 08/02/2023 11:53 AM EDT ----- Allergen test shows that she has low-grade sensitivities to numerous agents, benefit from evaluation by allergy.

## 2023-08-09 ENCOUNTER — Telehealth (INDEPENDENT_AMBULATORY_CARE_PROVIDER_SITE_OTHER): Payer: Self-pay

## 2023-08-09 ENCOUNTER — Other Ambulatory Visit (INDEPENDENT_AMBULATORY_CARE_PROVIDER_SITE_OTHER): Payer: Self-pay | Admitting: Nurse Practitioner

## 2023-08-09 MED ORDER — SULFAMETHOXAZOLE-TRIMETHOPRIM 800-160 MG PO TABS: 1.0000 | ORAL_TABLET | Freq: Two times a day (BID) | ORAL | 0 refills | Status: DC

## 2023-08-09 NOTE — Telephone Encounter (Signed)
 Pt made aware and states she will call if it does not get better to make an appt

## 2023-08-09 NOTE — Telephone Encounter (Signed)
 I'll call her in some ABX but if it persists she'll need to come in

## 2023-08-20 ENCOUNTER — Ambulatory Visit
Admission: RE | Admit: 2023-08-20 | Discharge: 2023-08-20 | Disposition: A | Discharge status: Home / Self Care | Source: Ambulatory Visit | Attending: Pulmonary Disease | Admitting: Pulmonary Disease

## 2023-08-20 DIAGNOSIS — I517 Cardiomegaly: Secondary | ICD-10-CM | POA: Insufficient documentation

## 2023-08-20 DIAGNOSIS — R0602 Shortness of breath: Secondary | ICD-10-CM | POA: Diagnosis not present

## 2023-08-20 LAB — ECHOCARDIOGRAM COMPLETE: S' Lateral: 2.7 cm

## 2023-08-28 ENCOUNTER — Encounter: Payer: Self-pay | Admitting: Pulmonary Disease

## 2023-08-28 ENCOUNTER — Encounter (INDEPENDENT_AMBULATORY_CARE_PROVIDER_SITE_OTHER): Payer: Self-pay

## 2023-08-28 ENCOUNTER — Ambulatory Visit: Admitting: Pulmonary Disease

## 2023-08-28 ENCOUNTER — Ambulatory Visit (INDEPENDENT_AMBULATORY_CARE_PROVIDER_SITE_OTHER): Admitting: Pulmonary Disease

## 2023-08-28 VITALS — BP 132/72 | HR 72 | Temp 97.6°F | Ht 61.0 in | Wt 336.0 lb

## 2023-08-28 DIAGNOSIS — J4540 Moderate persistent asthma, uncomplicated: Secondary | ICD-10-CM | POA: Diagnosis not present

## 2023-08-28 DIAGNOSIS — I4819 Other persistent atrial fibrillation: Secondary | ICD-10-CM

## 2023-08-28 DIAGNOSIS — R0602 Shortness of breath: Secondary | ICD-10-CM

## 2023-08-28 DIAGNOSIS — G4733 Obstructive sleep apnea (adult) (pediatric): Secondary | ICD-10-CM

## 2023-08-28 DIAGNOSIS — J45901 Unspecified asthma with (acute) exacerbation: Secondary | ICD-10-CM

## 2023-08-28 DIAGNOSIS — R635 Abnormal weight gain: Secondary | ICD-10-CM

## 2023-08-28 DIAGNOSIS — Z7722 Contact with and (suspected) exposure to environmental tobacco smoke (acute) (chronic): Secondary | ICD-10-CM | POA: Diagnosis not present

## 2023-08-28 LAB — PULMONARY FUNCTION TEST
DL/VA % pred: 115 %
DL/VA: 4.95 ml/min/mmHg/L
DLCO unc % pred: 84 %
DLCO unc: 15.1 ml/min/mmHg
FEF 25-75 Post: 1.23 L/s
FEF 25-75 Pre: 1.36 L/s
FEF2575-%Change-Post: -9 %
FEF2575-%Pred-Post: 58 %
FEF2575-%Pred-Pre: 64 %
FEV1-%Change-Post: -2 %
FEV1-%Pred-Post: 62 %
FEV1-%Pred-Pre: 63 %
FEV1-Post: 1.38 L
FEV1-Pre: 1.42 L
FEV1FVC-%Change-Post: -2 %
FEV1FVC-%Pred-Pre: 102 %
FEV6-%Change-Post: 0 %
FEV6-%Pred-Post: 63 %
FEV6-%Pred-Pre: 63 %
FEV6-Post: 1.78 L
FEV6-Pre: 1.77 L
FEV6FVC-%Change-Post: 0 %
FEV6FVC-%Pred-Post: 103 %
FEV6FVC-%Pred-Pre: 103 %
FVC-%Change-Post: 0 %
FVC-%Pred-Post: 61 %
FVC-%Pred-Pre: 61 %
FVC-Post: 1.78 L
FVC-Pre: 1.77 L
Post FEV1/FVC ratio: 78 %
Post FEV6/FVC ratio: 100 %
Pre FEV1/FVC ratio: 80 %
Pre FEV6/FVC Ratio: 100 %
RV % pred: 74 %
RV: 1.39 L
TLC % pred: 70 %
TLC: 3.23 L

## 2023-08-28 MED ORDER — BREZTRI AEROSPHERE 160-9-4.8 MCG/ACT IN AERO: 2.0000 | INHALATION_SPRAY | Freq: Two times a day (BID) | RESPIRATORY_TRACT | Status: DC

## 2023-08-28 MED ORDER — BREZTRI AEROSPHERE 160-9-4.8 MCG/ACT IN AERO: 2.0000 | INHALATION_SPRAY | Freq: Two times a day (BID) | RESPIRATORY_TRACT | 11 refills | Status: AC

## 2023-08-28 NOTE — Patient Instructions (Signed)
 VISIT SUMMARY:  Today, we discussed your shortness of breath, obesity, and obstructive sleep apnea. You have shown improvement in your breathing since starting Breztri , and you are actively managing your weight and sleep apnea.  YOUR PLAN:  -SHORTNESS OF BREATH: Your shortness of breath has improved since starting Breztri , which is reducing your need for albuterol . Pulmonary function tests indicate that your lung capacity is limited, likely due to your weight. We will continue with Breztri  and provide you with samples.  -OBESITY: Obesity is contributing to your restricted lung volumes and shortness of breath. You are following a low-key fitness program and watching your diet. We discussed potential weight loss medications like Zepbound or Mounjaro, which may also help with your sleep apnea. We will order blood work to check for hormonal causes of weight gain, including thyroid  function tests.  -OBSTRUCTIVE SLEEP APNEA: You are using your CPAP therapy regularly, which is good. Losing weight may help improve your sleep apnea symptoms. We discussed that weight loss medications might also benefit your sleep apnea.  INSTRUCTIONS:  Please discuss weight loss medications with your primary care physician. Additionally, we will order blood work to check for hormonal causes of weight gain, including thyroid  function tests.  We will see her in follow-up in 2 to 3 months time call sooner should any new problems arise.

## 2023-08-28 NOTE — Progress Notes (Signed)
 Full PFT completed today ? ?

## 2023-08-28 NOTE — Progress Notes (Signed)
 Subjective:    [name], female    DOB: [birth year], 63 y.o.   MRN: [id number]  Patient Care Team: Raina Bunting, DO as PCP - [facility])  Chief Complaint  Patient presents with   Follow-up    Shortness of breath on exertion. Occasional cough. No wheezing.     BACKGROUND/INTERVAL:Very complex 63 year old woman with a history as noted below who presents for evaluation of dyspnea on exertion and cough as well as wheezing.  She has had symptoms since January.  Previously seen by Dr. Felton Hough at our office in [place] in 2021.  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  Initially evaluated her on 19 July 2023  HPI Discussed the use of AI scribe software for clinical note transcription with the patient, who gave verbal consent to proceed.  History of Present Illness   Katherine Scott is a 63 year old female who presents for follow-up of shortness of breath.  She experiences shortness of breath, which has improved since starting Breztri . She uses Breztri  and Ventolin , noting a decreased need for albuterol  since beginning Breztri . Pulmonary function tests were conducted today.  She is compliant with CPAP therapy, using it nightly. She is attempting to manage her weight independently due to leg issues that limit significant exertion. She has initiated a low-key fitness program on her phone, which she tries to do daily or every other day. This program includes exercises that can be done while sitting, and she is also mindful of her diet. She experiences some shortness of breath during these exercises.  She has not discussed weight management medications with her primary doctor. She has not had recent blood work to check for hormonal causes of weight gain, and she does not recall a recent thyroid  check since 2023.  She did notice a rapid increase in weight over the last year.  She has prominent  central obesity and a prominent dorsocervical cervical fat pad.     Review of Systems A 10 point review of systems was performed and it is as noted above otherwise negative.   Patient Active Problem List   Diagnosis Date Noted   Centrilobular emphysema (HCC) 11/21/2021   OSA (obstructive sleep apnea) 07/16/2021   Pulmonary embolism with acute cor pulmonale (HCC) 07/15/2021   Chronic atrial fibrillation with RVR (HCC) 07/15/2021   Chronic systolic CHF (congestive heart failure) (HCC) 07/15/2021   Elevated troponin 07/15/2021   HTN (hypertension) 07/15/2021   Closed fracture of proximal end of left humerus 04/15/2020   Shortness of breath 02/12/2020   Venous ulcer (HCC) 10/09/2019   Lymphedema 02/26/2017   Recurrent cellulitis of lower extremity 01/20/2017   Acquired hypothyroidism 08/11/2016   BMI 60.0-69.9, adult (HCC) 08/11/2016   Persistent atrial fibrillation (HCC) 06/17/2014    Social History   Tobacco Use   Smoking status: Never    Passive exposure: Yes   Smokeless tobacco: Never  Substance Use Topics   Alcohol use: No    Allergies  Allergen Reactions   Amiodarone      Caused toxicity     Current Meds  Medication Sig   albuterol  (VENTOLIN  HFA) 108 (90 Base) MCG/ACT inhaler Inhale 2 puffs into the lungs every 4 (four) hours as needed for wheezing.   apixaban  (ELIQUIS ) 5 MG TABS tablet Take 1 tablet (5 mg total) by mouth 2 (two) times daily.   budesonide-glycopyrrolate-formoterol  (BREZTRI  AEROSPHERE) 160-9-4.8  MCG/ACT AERO inhaler Inhale 2 puffs into the lungs in the morning and at bedtime.   diltiazem  (DILACOR XR ) 240 MG 24 hr capsule Take 240 mg by mouth daily.   furosemide  (LASIX ) 80 MG tablet Take 80 mg by mouth daily.   ipratropium (ATROVENT ) 0.06 % nasal spray Place 2 sprays into both nostrils 4 (four) times daily.   potassium chloride  SA (KLOR-CON  M) 20 MEQ tablet Take 1 tablet (20 mEq total) by mouth daily.   promethazine -dextromethorphan  (PROMETHAZINE -DM)  6.25-15 MG/5ML syrup Take 5 mLs by mouth 4 (four) times daily as needed.   Spacer/Aero-Holding Chambers DEVI 1 Device by Does not apply route as needed.   TIADYLT  ER 240 MG 24 hr capsule Take 240 mg by mouth daily.   traMADol  (ULTRAM ) 50 MG tablet Take 1 tablet (50 mg total) by mouth every 6 (six) hours as needed.   [DISCONTINUED] furosemide  (LASIX ) 40 MG tablet Take 2 tablets (80 mg total) by mouth daily.    Immunization History  Administered Date(s) Administered   Influenza,inj,Quad PF,6+ Mos 02/04/2019, 02/20/2020   PFIZER(Purple Top)SARS-COV-2 Vaccination 08/01/2019, 09/03/2019        Objective:     BP 132/72 (BP Location: Left Arm, Patient Position: Sitting, Cuff Size: Normal)   Pulse 72   Temp 97.6 F (36.4 C) (Temporal)   Ht 5\' 1"  (1.549 m)   Wt (!) 336 lb (152.4 kg)   SpO2 98%   BMI 63.49 kg/m   SpO2: 98 %  GENERAL: Obese woman, no acute distress, ambulatory with assistance of a cane.  Mild conversational dyspnea. HEAD: Normocephalic, atraumatic.  EYES: Pupils equal, round, reactive to light.  No scleral icterus.  MOUTH: Few chipped teeth, oral mucosa moist.  No thrush. NECK: Supple. No thyromegaly. Trachea midline. No JVD.  No adenopathy.  Prominent dorsocervical fat pad. PULMONARY: Good air entry bilaterally.  Faint end expiratory wheezing throughout.  No rales.  No rhonchi. CARDIOVASCULAR: S1 and S2. Regular rate and rhythm.  ABDOMEN: Obese. MUSCULOSKELETAL: No joint deformity, no clubbing, no edema.  No LE asymmetry.  No calf tenderness. NEUROLOGIC: No overt focal deficit, gait supported by cane, speech is fluent. SKIN: Intact,warm,dry.  Chronic stasis changes bilaterally. PSYCH: Mood and behavior normal.   Assessment & Plan:     ICD-10-CM   1. Moderate persistent asthma without complication  J45.40 BREZTRI  AEROSPHERE 160-9-4.8 MCG/ACT AERO inhaler    2. Shortness of breath  R06.02     3. Persistent atrial fibrillation (HCC)  I48.19     4. Abnormal  weight gain  R63.5 ACTH     Cortisol    TSH    T4, free   Query Cushings/Hypothyroidism    5. OSA on CPAP  G47.33       Orders Placed This Encounter  Procedures   ACTH     Standing Status:   Future    Number of Occurrences:   1    Expiration Date:   08/27/2024   Cortisol    Standing Status:   Future    Number of Occurrences:   1    Expiration Date:   08/27/2024   TSH    Standing Status:   Future    Number of Occurrences:   1    Expiration Date:   08/27/2024   T4, free    Standing Status:   Future    Number of Occurrences:   1    Expiration Date:   08/27/2024    Meds ordered this encounter  Medications  BREZTRI  AEROSPHERE 160-9-4.8 MCG/ACT AERO inhaler    Sig: Inhale 2 puffs into the lungs 2 (two) times daily.    Dispense:  10.7 g    Refill:  11   budesonide-glycopyrrolate-formoterol  (BREZTRI  AEROSPHERE) 160-9-4.8 MCG/ACT AERO inhaler    Sig: Inhale 2 puffs into the lungs in the morning and at bedtime.    Dispense:  2 each    Lot Number?:   [id number]    Expiration Date?:   12/09/2025    Manufacturer?:   [employer] [[id number]]    NDC:   1610-9604-54 [[id number]]    Quantity:   2   Discussion:    Shortness of breath Improvement in shortness of breath since last visit. Breztri  is effective, reducing albuterol  use. Pulmonary function tests show restricted lung volumes, likely related to obesity. ERV is low, indicating limited lung capacity due to weight. - Prescribe Breztri  - Provide Breztri  samples  Obesity/abnormal weight gain Obesity contributes to restricted lung volumes and shortness of breath. She is engaged in a low-key fitness program and is mindful of dietary intake. Discussed potential weight loss medications such as Zepbound or Mounjaro, which may also benefit obstructive sleep apnea. Blood work to be ordered to check for hormonal causes of weight gain, including thyroid  function tests.  Has a prominent dorsocervical fat pad query Cushing's syndrome. - Discuss weight loss  medications with primary care physician - Order blood work to check for hormonal causes of weight gain, including thyroid  function tests  Obstructive sleep apnea She is compliant with CPAP therapy. Weight loss may improve sleep apnea symptoms. Potential benefit from weight loss medications that have indications for sleep apnea. - Discuss weight loss medications with primary care physician     Advised if symptoms do not improve or worsen, to please contact office for sooner follow up or seek emergency care.    I spent 40 minutes of dedicated to the care of this patient on the date of this encounter to include pre-visit review of records, face-to-face time with the patient discussing conditions above, post visit ordering of testing, clinical documentation with the electronic health record, making appropriate referrals as documented, and communicating necessary findings to members of the patients care team.     C. Chloe Counter, MD Advanced Bronchoscopy PCCM [facility]    *This note was generated using voice recognition software/Dragon and/or AI transcription program.  Despite best efforts to proofread, errors can occur which can change the meaning. Any transcriptional errors that result from this process are unintentional and may not be fully corrected at the time of dictation.

## 2023-08-28 NOTE — Patient Instructions (Signed)
 Full PFT completed today ? ?

## 2023-08-29 ENCOUNTER — Ambulatory Visit: Payer: Self-pay | Admitting: Pulmonary Disease

## 2023-09-04 ENCOUNTER — Other Ambulatory Visit
Admission: RE | Admit: 2023-09-04 | Discharge: 2023-09-04 | Disposition: A | Discharge status: Home / Self Care | Attending: Pulmonary Disease | Admitting: Pulmonary Disease

## 2023-09-04 DIAGNOSIS — E662 Morbid (severe) obesity with alveolar hypoventilation: Secondary | ICD-10-CM | POA: Diagnosis not present

## 2023-09-04 DIAGNOSIS — J300 Vasomotor rhinitis: Secondary | ICD-10-CM | POA: Diagnosis not present

## 2023-09-04 LAB — T4, FREE: Free T4: 0.59 ng/dL — ABNORMAL LOW (ref 0.61–1.12)

## 2023-09-04 LAB — TSH: TSH: 3.545 u[IU]/mL (ref 0.350–4.500)

## 2023-09-05 LAB — ACTH: C206 ACTH: 16.5 pg/mL (ref 7.2–63.3)

## 2023-09-05 LAB — CORTISOL: Cortisol, Plasma: 4.7 ug/dL

## 2023-09-11 ENCOUNTER — Encounter: Payer: Self-pay | Admitting: Pulmonary Disease

## 2023-09-11 NOTE — Progress Notes (Signed)
 Please see progress note of 28 Aug 2023.  Patient has had abnormal weight gain, morbid obesity with obesity hypoventilation syndrome.   Acey Ace, MD Advanced Bronchoscopy PCCM [facility]

## 2023-09-25 ENCOUNTER — Encounter: Payer: Self-pay | Admitting: Sleep Medicine

## 2023-09-25 ENCOUNTER — Telehealth (INDEPENDENT_AMBULATORY_CARE_PROVIDER_SITE_OTHER): Payer: Self-pay

## 2023-09-25 ENCOUNTER — Other Ambulatory Visit (INDEPENDENT_AMBULATORY_CARE_PROVIDER_SITE_OTHER): Payer: Self-pay | Admitting: Nurse Practitioner

## 2023-09-25 ENCOUNTER — Ambulatory Visit (INDEPENDENT_AMBULATORY_CARE_PROVIDER_SITE_OTHER): Admitting: Sleep Medicine

## 2023-09-25 ENCOUNTER — Other Ambulatory Visit: Payer: Self-pay | Admitting: Sleep Medicine

## 2023-09-25 VITALS — BP 136/82 | HR 99 | Temp 97.1°F | Ht 61.0 in | Wt 337.0 lb

## 2023-09-25 DIAGNOSIS — I4819 Other persistent atrial fibrillation: Secondary | ICD-10-CM

## 2023-09-25 DIAGNOSIS — G4733 Obstructive sleep apnea (adult) (pediatric): Secondary | ICD-10-CM | POA: Diagnosis not present

## 2023-09-25 MED ORDER — FLUCONAZOLE 150 MG PO TABS: 150.0000 mg | ORAL_TABLET | Freq: Once | ORAL | 0 refills | Status: AC

## 2023-09-25 MED ORDER — ZEPBOUND 2.5 MG/0.5ML ~~LOC~~ SOAJ: 2.5000 mg | SUBCUTANEOUS | 1 refills | Status: DC

## 2023-09-25 MED ORDER — CIPROFLOXACIN HCL 500 MG PO TABS: 500.0000 mg | ORAL_TABLET | Freq: Two times a day (BID) | ORAL | 0 refills | Status: DC

## 2023-09-25 NOTE — Telephone Encounter (Signed)
 Patient has been notified with medical recommendations and verbalized understanding

## 2023-09-25 NOTE — Telephone Encounter (Signed)
 I sent in the antibiotics that worked well for her last time.  If these are not helpful she may need to go back in [employer]

## 2023-09-25 NOTE — Telephone Encounter (Signed)
 Patient left a message stating that she is having recurring left le cellulitis and is requesting for an antibiotic. Please Advise

## 2023-09-25 NOTE — Progress Notes (Signed)
 Name:Katherine Scott MRN: [id number] DOB: 1961/03/04   CHIEF COMPLAINT:  CPAP F/U   HISTORY OF PRESENT ILLNESS:  [name] is a 63 y.o. w/ a h/o OSA, atrial fibrillation, morbid obesity and COPD who present for CPAP F/U visit. Reports using CPAP therapy every night, which is confirmed by compliance data. She is currently using a full face mask, which is comfortable. Denies air leaks or nasal congestion. Also reports feeling more refreshed upon awakening with CPAP therapy.    EPWORTH SLEEP SCORE    07/26/2023   11:00 AM  Results of the Epworth flowsheet  Sitting and reading 3  Watching TV 2  Sitting, inactive in a public place (e.g. a theatre or a meeting) 1  As a passenger in a car for an hour without a break 1  Lying down to rest in the afternoon when circumstances permit 3  Sitting and talking to someone 0  Sitting quietly after a lunch without alcohol 1  In a car, while stopped for a few minutes in traffic 0  Total score 11    PAST MEDICAL HISTORY :   has a past medical history of Abnormal mammogram, unspecified (2013), Atrial fibrillation (HCC), Breast screening, unspecified, CHF (congestive heart failure) (HCC), COPD (chronic obstructive pulmonary disease) (HCC), History of continuous positive airway pressure (CPAP) therapy at home, Hypothyroidism, Obesity, unspecified, Pneumonia, Screening for obesity, Sleep apnea, Special screening for malignant neoplasms, colon, and Unspecified essential hypertension.  has a past surgical history that includes Colonoscopy (2011); Abdominal hysterectomy (2011); Anterior cruciate ligament repair (2003); Knee surgery (2013); Cesarean section (1985, 1987, and 1998); Breast biopsy (Left, January 22, 2012); Cardiac catheterization (N/A, 12/03/2014); Cardiac catheterization (N/A, 04/01/2015); CARDIOVERSION (N/A, 09/22/2016); CARDIOVERSION (N/A, 01/17/2018); Cardioversion (N/A, 01/09/2019); and PULMONARY THROMBECTOMY (Bilateral, 07/15/2021). Prior to  Admission medications   Medication Sig Start Date End Date Taking? Authorizing Provider  albuterol  (VENTOLIN  HFA) 108 (90 Base) MCG/ACT inhaler Inhale 2 puffs into the lungs every 4 (four) hours as needed for wheezing. 07/11/23  Yes Brimage, Vondra, DO  apixaban  (ELIQUIS ) 5 MG TABS tablet Take 1 tablet (5 mg total) by mouth 2 (two) times daily. 07/25/21  Yes Donaciano Frizzle, MD  BREZTRI  AEROSPHERE 160-9-4.8 MCG/ACT AERO inhaler Inhale 2 puffs into the lungs 2 (two) times daily. 08/28/23  Yes Marc Senior, MD  budesonide-glycopyrrolate-formoterol  (BREZTRI  AEROSPHERE) 160-9-4.8 MCG/ACT AERO inhaler Inhale 2 puffs into the lungs in the morning and at bedtime. 08/28/23  Yes Marc Senior, MD  diltiazem  (DILACOR XR ) 240 MG 24 hr capsule Take 240 mg by mouth daily.   Yes [provider]  furosemide  (LASIX ) 80 MG tablet Take 80 mg by mouth daily. 08/17/23  Yes [provider]  ipratropium (ATROVENT ) 0.06 % nasal spray Place 2 sprays into both nostrils 4 (four) times daily. 04/11/23  Yes Kent Pear, NP  potassium chloride  SA (KLOR-CON  M) 20 MEQ tablet Take 1 tablet (20 mEq total) by mouth daily. 08/25/22  Yes Karamalegos, Kayleen Party, DO  Spacer/Aero-Holding Chambers DEVI 1 Device by Does not apply route as needed. 05/24/18  Yes Brenda Calkin, NP  TIADYLT  ER 240 MG 24 hr capsule Take 240 mg by mouth daily. 04/01/23  Yes [provider]  traMADol  (ULTRAM ) 50 MG tablet Take 1 tablet (50 mg total) by mouth every 6 (six) hours as needed. 09/02/21  Yes Brown, Fallon E, NP  promethazine -dextromethorphan  (PROMETHAZINE -DM) 6.25-15 MG/5ML syrup Take 5 mLs by mouth 4 (four) times daily as  needed. Patient not taking: Reported on 09/25/2023 07/18/23   Kent Pear, NP   Allergies  Allergen Reactions   Amiodarone      Caused toxicity     FAMILY HISTORY:  family history includes Atrial fibrillation in her sister; Breast cancer in an other family member; Colon cancer in her mother,  sister, and another family member; Heart disease in her father; Lung cancer in an other family member. SOCIAL HISTORY:  reports that she has never smoked. She has been exposed to tobacco smoke. She has never used smokeless tobacco. She reports that she does not drink alcohol and does not use drugs.   Review of Systems:  Gen:  Denies  fever, sweats, chills weight loss  HEENT: Denies blurred vision, double vision, ear pain, eye pain, hearing loss, nose bleeds, sore throat Cardiac:  No dizziness, chest pain or heaviness, chest tightness,edema, No JVD Resp:   No cough, -sputum production, -shortness of breath,-wheezing, -hemoptysis,  Gi: Denies swallowing difficulty, stomach pain, nausea or vomiting, diarrhea, constipation, bowel incontinence Gu:  Denies bladder incontinence, burning urine Ext:   Denies Joint pain, stiffness or swelling Skin: Denies  skin rash, easy bruising or bleeding or hives Endoc:  Denies polyuria, polydipsia , polyphagia or weight change Psych:   Denies depression, insomnia or hallucinations  Other:  All other systems negative  VITAL SIGNS: BP 136/82 (BP Location: Right Arm, Cuff Size: Large)   Pulse 99   Temp (!) 97.1 F (36.2 C)   Ht 5' 1 (1.549 m)   Wt (!) 337 lb (152.9 kg)   SpO2 96%   BMI 63.68 kg/m    Physical Examination:   General Appearance: No distress  EYES PERRLA, EOM intact.   NECK Supple, No JVD Pulmonary: normal breath sounds, No wheezing.  CardiovascularNormal S1,S2.  No m/r/g.   Abdomen: Benign, Soft, non-tender. Skin:   warm, no rashes, no ecchymosis  Extremities: normal, no cyanosis, clubbing. Neuro:without focal findings,  speech normal  PSYCHIATRIC: Mood, affect within normal limits.   ASSESSMENT AND PLAN  OSA Patient is using and benefiting from CPAP therapy. Discussed the consequences of untreated sleep apnea. Advised not to drive drowsy for safety of patient and others. Will follow up in 3 months.    Atrial  fibrillation Stable, on current management. Following with PCP.   Morbid obesity Counseled patient on diet and lifestyle modification. Will also try patient on Zepbound and titrate as tolerated. Denies a family or personal history of thyroid  medullary CA or pancreatitis.    MEDICATION ADJUSTMENTS/LABS AND TESTS ORDERED: Recommend Sleep Study   Patient  satisfied with Plan of action and management. All questions answered  I spent a total of 24 minutes reviewing chart data, face-to-face evaluation with the patient, counseling and coordination of care as detailed above.    Alyviah Crandle, M.D.  Sleep Medicine [facility]

## 2023-09-25 NOTE — Patient Instructions (Signed)

## 2023-09-28 ENCOUNTER — Other Ambulatory Visit (HOSPITAL_COMMUNITY): Payer: Self-pay

## 2023-09-28 ENCOUNTER — Telehealth: Payer: Self-pay

## 2023-09-28 MED ORDER — ZEPBOUND 2.5 MG/0.5ML ~~LOC~~ SOAJ
2.5000 mg | SUBCUTANEOUS | 1 refills | Status: DC
Start: 2023-09-28 — End: 2023-12-26

## 2023-09-28 NOTE — Addendum Note (Signed)
 Addended by: Lesslie Mckeehan J on: 09/28/2023 08:40 AM   Modules accepted: Orders

## 2023-09-28 NOTE — Telephone Encounter (Signed)
*  Pulm  Pharmacy Patient Advocate Encounter   Received notification from RX Request Messages that prior authorization for Zepbound 2.5MG /0.5ML pen-injectors  is required/requested.   Insurance verification completed.   The patient is insured through [employer] .   Per test claim: PA required; PA started via CoverMyMeds. KEY [id number] . Please see clinical question(s) below that I am not finding the answer to in their chart and advise.   Sleep study with results are needed to continue request for OSA.

## 2023-10-02 NOTE — Telephone Encounter (Signed)
 Dr. Jess I can not find her sleep study. Looks like she had it done years ago. How would you like to proceed?

## 2023-10-09 NOTE — Telephone Encounter (Signed)
 I have called the DME company and they do not have a copy of her sleep study.   Per Dr. Jess, if she would like to proceed with the Zepbound  prescription she will have to have another sleep study done.  LMTCB. E2C2 please advise when patient calls back.

## 2023-10-10 NOTE — Telephone Encounter (Signed)
 Copied from CRM [id number]. Topic: Clinical - Medical Advice >> Oct 09, 2023  5:14 PM Rozanna G wrote: Reason for CRM: PT RETURN CALL TO NURSE, STATED SHE WILL GET WITH HER INSURANCE FIRST AND SEE WHAT THE COVERAGE WILL BE AND SHE REACH BACK OUT. STATED SHE HAS NOT HAD A SLEEP STUDY. THANKS

## 2023-10-24 DIAGNOSIS — I5022 Chronic systolic (congestive) heart failure: Secondary | ICD-10-CM | POA: Diagnosis not present

## 2023-10-24 DIAGNOSIS — I890 Lymphedema, not elsewhere classified: Secondary | ICD-10-CM | POA: Diagnosis not present

## 2023-10-24 DIAGNOSIS — I4819 Other persistent atrial fibrillation: Secondary | ICD-10-CM | POA: Diagnosis not present

## 2023-10-24 DIAGNOSIS — I480 Paroxysmal atrial fibrillation: Secondary | ICD-10-CM | POA: Diagnosis not present

## 2023-10-24 DIAGNOSIS — I10 Essential (primary) hypertension: Secondary | ICD-10-CM | POA: Diagnosis not present

## 2023-10-24 DIAGNOSIS — G4733 Obstructive sleep apnea (adult) (pediatric): Secondary | ICD-10-CM | POA: Diagnosis not present

## 2023-10-24 DIAGNOSIS — Z6841 Body Mass Index (BMI) 40.0 and over, adult: Secondary | ICD-10-CM | POA: Diagnosis not present

## 2023-10-30 ENCOUNTER — Ambulatory Visit: Admitting: Sleep Medicine

## 2023-11-16 ENCOUNTER — Telehealth (INDEPENDENT_AMBULATORY_CARE_PROVIDER_SITE_OTHER): Payer: Self-pay

## 2023-11-16 NOTE — Telephone Encounter (Signed)
 She needs to be seen and I suspect she needs to return to [employer] [facility] as we just sent in antibiotics for her 2 months ago with the same issue

## 2023-11-16 NOTE — Telephone Encounter (Signed)
 Patient has been notified to expecting a call to be scheduled for an appt

## 2023-11-16 NOTE — Telephone Encounter (Signed)
 Patient left a message stating that she is having cellulitis with the left leg. Patient stated that the leg has some redness and itchiness. Patient is requesting for antibiotic to sent to pharmacy. Please Advise

## 2023-11-21 ENCOUNTER — Encounter (INDEPENDENT_AMBULATORY_CARE_PROVIDER_SITE_OTHER)

## 2023-11-22 ENCOUNTER — Ambulatory Visit (INDEPENDENT_AMBULATORY_CARE_PROVIDER_SITE_OTHER): Admitting: Nurse Practitioner

## 2023-11-22 ENCOUNTER — Encounter (INDEPENDENT_AMBULATORY_CARE_PROVIDER_SITE_OTHER): Payer: Self-pay

## 2023-11-22 VITALS — BP 168/89 | HR 77 | Resp 18

## 2023-11-22 DIAGNOSIS — L03119 Cellulitis of unspecified part of limb: Secondary | ICD-10-CM

## 2023-11-22 DIAGNOSIS — L03116 Cellulitis of left lower limb: Secondary | ICD-10-CM

## 2023-11-22 NOTE — Progress Notes (Signed)
 History of Present Illness  There is no documented history at this time  Assessments & Plan   There are no diagnoses linked to this encounter.    Additional instructions  Subjective:  Patient presents with venous ulcer of the Left lower extremity.    Procedure:  3 layer unna wrap was placed Left lower extremity.   Plan:   Follow up in one week.

## 2023-11-23 ENCOUNTER — Encounter (INDEPENDENT_AMBULATORY_CARE_PROVIDER_SITE_OTHER): Payer: Self-pay | Admitting: Nurse Practitioner

## 2023-11-23 MED ORDER — FLUCONAZOLE 150 MG PO TABS: 150.0000 mg | ORAL_TABLET | Freq: Once | ORAL | 0 refills | Status: AC

## 2023-11-23 MED ORDER — CIPROFLOXACIN HCL 500 MG PO TABS: 500.0000 mg | ORAL_TABLET | Freq: Two times a day (BID) | ORAL | 0 refills | Status: DC

## 2023-11-28 ENCOUNTER — Encounter (INDEPENDENT_AMBULATORY_CARE_PROVIDER_SITE_OTHER)

## 2023-11-29 ENCOUNTER — Encounter (INDEPENDENT_AMBULATORY_CARE_PROVIDER_SITE_OTHER)

## 2023-12-03 ENCOUNTER — Telehealth (INDEPENDENT_AMBULATORY_CARE_PROVIDER_SITE_OTHER): Payer: Self-pay

## 2023-12-03 ENCOUNTER — Other Ambulatory Visit (INDEPENDENT_AMBULATORY_CARE_PROVIDER_SITE_OTHER): Payer: Self-pay | Admitting: Nurse Practitioner

## 2023-12-03 ENCOUNTER — Ambulatory Visit (INDEPENDENT_AMBULATORY_CARE_PROVIDER_SITE_OTHER): Admitting: Nurse Practitioner

## 2023-12-03 DIAGNOSIS — L03116 Cellulitis of left lower limb: Secondary | ICD-10-CM

## 2023-12-03 DIAGNOSIS — L03119 Cellulitis of unspecified part of limb: Secondary | ICD-10-CM

## 2023-12-03 MED ORDER — CIPROFLOXACIN HCL 500 MG PO TABS: 500.0000 mg | ORAL_TABLET | Freq: Two times a day (BID) | ORAL | 0 refills | Status: DC

## 2023-12-03 NOTE — Telephone Encounter (Signed)
 Pt came in today and wrapped in Katherine Scott, Katherine Scott. She stated that she didn't follow up last week to be wrapped, she said that she has finished the antidotic, she was notice that she was still having some redness and wanted to know if she needed to have another round of this . Please advise.

## 2023-12-03 NOTE — Telephone Encounter (Signed)
 She needs to be sure to continue with her wraps, she is getting recurrent cellulitis because her swelling is not well controlled.

## 2023-12-04 ENCOUNTER — Encounter (INDEPENDENT_AMBULATORY_CARE_PROVIDER_SITE_OTHER): Payer: Self-pay | Admitting: Nurse Practitioner

## 2023-12-04 NOTE — Telephone Encounter (Signed)
 Called and spoke with patient informed her of this message per Orvin , pt stated that she understood that she will follow up on her appt. 09/02/2.

## 2023-12-04 NOTE — Progress Notes (Signed)
 History of Present Illness  There is no documented history at this time  Assessments & Plan   There are no diagnoses linked to this encounter.    Additional instructions  Subjective:  Patient presents with venous ulcer of the Left lower extremity.    Procedure:  3 layer unna wrap was placed Left lower extremity.   Plan:   Follow up in one week.

## 2023-12-11 ENCOUNTER — Ambulatory Visit (INDEPENDENT_AMBULATORY_CARE_PROVIDER_SITE_OTHER): Admitting: Nurse Practitioner

## 2023-12-13 ENCOUNTER — Encounter (INDEPENDENT_AMBULATORY_CARE_PROVIDER_SITE_OTHER): Payer: Self-pay | Admitting: Nurse Practitioner

## 2023-12-13 ENCOUNTER — Ambulatory Visit (INDEPENDENT_AMBULATORY_CARE_PROVIDER_SITE_OTHER): Admitting: Nurse Practitioner

## 2023-12-13 VITALS — BP 159/97 | HR 106 | Ht 61.0 in | Wt 344.2 lb

## 2023-12-13 DIAGNOSIS — I5022 Chronic systolic (congestive) heart failure: Secondary | ICD-10-CM | POA: Diagnosis not present

## 2023-12-13 DIAGNOSIS — I890 Lymphedema, not elsewhere classified: Secondary | ICD-10-CM | POA: Diagnosis not present

## 2023-12-13 DIAGNOSIS — L03119 Cellulitis of unspecified part of limb: Secondary | ICD-10-CM | POA: Diagnosis not present

## 2023-12-16 ENCOUNTER — Encounter (INDEPENDENT_AMBULATORY_CARE_PROVIDER_SITE_OTHER): Payer: Self-pay | Admitting: Nurse Practitioner

## 2023-12-16 NOTE — Progress Notes (Signed)
 Subjective:    [name], female    DOB: [birth year], 63 y.o.   MRN: [id number] Chief Complaint  Patient presents with   Follow-up    The patient returns today for follow-up evaluation of her lymphedema in the setting of having cellulitis as well.  The cellulitis has resolved but the patient is still continuing to have issues with edema.  She has been utilizing medical grade compression but she does not have a lymphedema pump.  She currently has no wounds or ulcerations.  She also notes that she has been having some worsening of her shortness of breath as well.    Review of Systems  All other systems reviewed and are negative.      Objective:   Physical Exam Vitals reviewed.  Constitutional:      Appearance: She is obese.  HENT:     Head: Normocephalic.  Cardiovascular:     Rate and Rhythm: Normal rate.  Pulmonary:     Effort: Pulmonary effort is normal.  Musculoskeletal:     Right lower leg: 3+ Edema present.     Left lower leg: 3+ Edema present.  Skin:    General: Skin is warm and dry.  Neurological:     Mental Status: She is alert and oriented to person, place, and time.     Gait: Gait abnormal.  Psychiatric:        Mood and Affect: Mood normal.        Behavior: Behavior normal.        Thought Content: Thought content normal.        Judgment: Judgment normal.     BP (!) 159/97   Pulse (!) 106   Ht 5' 1 (1.549 m)   Wt (!) 344 lb 4 oz (156.2 kg)   BMI 65.05 kg/m   Past Medical History:  Diagnosis Date   Abnormal mammogram, unspecified 2013   Atrial fibrillation (HCC)    2013   Breast screening, unspecified    CHF (congestive heart failure) (HCC)    COPD (chronic obstructive pulmonary disease) (HCC)    History of continuous positive airway pressure (CPAP) therapy at home    Hypothyroidism    Obesity, unspecified    Pneumonia    Screening for obesity    Sleep apnea    Special screening for malignant neoplasms, colon    Unspecified  essential hypertension     Social History   Socioeconomic History   Marital status: Divorced    Spouse name: Not on file   Number of children: Not on file   Years of education: Not on file   Highest education level: Not on file  Occupational History   Not on file  Tobacco Use   Smoking status: Never    Passive exposure: Yes   Smokeless tobacco: Never  Vaping Use   Vaping status: Never Used  Substance and Sexual Activity   Alcohol use: No   Drug use: No   Sexual activity: Not on file  Other Topics Concern   Not on file  Social History Narrative   Not on file   Social Drivers of Health   Financial Resource Strain: Medium Risk (02/09/2023)   Overall Financial Resource Strain (CARDIA)    Difficulty of Paying Living Expenses: Somewhat hard  Food Insecurity: No Food Insecurity (02/09/2023)   Hunger Vital Sign    Worried About Running Out of Food in the Last Year: Never true    Ran Out of  Food in the Last Year: Never true  Transportation Needs: No Transportation Needs (02/09/2023)   PRAPARE - Administrator, Civil Service (Medical): No    Lack of Transportation (Non-Medical): No  Physical Activity: Inactive (02/09/2023)   Exercise Vital Sign    Days of Exercise per Week: 0 days    Minutes of Exercise per Session: 0 min  Stress: No Stress Concern Present (02/09/2023)   [employer] of Occupational Health - Occupational Stress Questionnaire    Feeling of Stress : Only a little  Social Connections: Moderately Integrated (02/09/2023)   Social Connection and Isolation Panel    Frequency of Communication with Friends and Family: More than three times a week    Frequency of Social Gatherings with Friends and Family: Three times a week    Attends Religious Services: More than 4 times per year    Active Member of Clubs or Organizations: Yes    Attends Banker Meetings: More than 4 times per year    Marital Status: Divorced  Intimate Partner Violence: Not  At Risk (02/09/2023)   Humiliation, Afraid, Rape, and Kick questionnaire    Fear of Current or Ex-Partner: No    Emotionally Abused: No    Physically Abused: No    Sexually Abused: No    Past Surgical History:  Procedure Laterality Date   ABDOMINAL HYSTERECTOMY  2011   ANTERIOR CRUCIATE LIGAMENT REPAIR  2003   BREAST BIOPSY Left January 22, 2012   left breast stereotactic biopsy showed fibroadenomatous changes with microcalcifications, fat necrosis, and sclerosing adenosis and columnar cell changes. No evidence of atypia or malignancy.   CARDIOVERSION N/A 09/22/2016   Procedure: CARDIOVERSION;  Surgeon: Bosie Vinie LABOR, MD;  Location: [facility];  Service: Cardiovascular;  Laterality: N/A;   CARDIOVERSION N/A 01/17/2018   Procedure: CARDIOVERSION;  Surgeon: Bosie Vinie LABOR, MD;  Location: [facility];  Service: Cardiovascular;  Laterality: N/A;   CARDIOVERSION N/A 01/09/2019   Procedure: CARDIOVERSION;  Surgeon: Bosie Vinie LABOR, MD;  Location: [facility];  Service: Cardiovascular;  Laterality: N/A;   CESAREAN SECTION  1985, 1987, and 1998   COLONOSCOPY  2011   Dr. Viktoria   ELECTROPHYSIOLOGIC STUDY N/A 12/03/2014   Procedure: Cardioversion;  Surgeon: Vinie LABOR Bosie, MD;  Location: [facility];  Service: Cardiovascular;  Laterality: N/A;   ELECTROPHYSIOLOGIC STUDY N/A 04/01/2015   Procedure: Cardioversion;  Surgeon: Vinie LABOR Bosie, MD;  Location: [facility];  Service: Cardiovascular;  Laterality: N/A;   KNEE SURGERY  2013   PULMONARY THROMBECTOMY Bilateral 07/15/2021   Procedure: PULMONARY THROMBECTOMY;  Surgeon: Marea Selinda RAMAN, MD;  Location: [facility];  Service: Cardiovascular;  Laterality: Bilateral;    Family History  Problem Relation Age of Onset   Colon cancer Mother        diagnosis age 54's   Colon cancer Sister        diagnosis age 50   Atrial fibrillation Sister    Breast cancer Other        Maternal Grandmother; diagnosis age 37's   Lung cancer Other        Paternal  Grandmother; diagnosis age 65's   Colon cancer Other        Paternal Grandfather; diagnosis age 36's   Heart disease Father     Allergies  Allergen Reactions   Amiodarone      Caused toxicity        Latest Ref Rng & Units 07/19/2023   11:36 AM 07/21/2021  5:50 AM 07/20/2021    5:08 AM  CBC  WBC 4.0 - 10.5 K/uL 6.9  8.3  9.0   Hemoglobin 12.0 - 15.0 g/dL 86.5  89.1  89.0   Hematocrit 36.0 - 46.0 % 43.3  35.1  35.8   Platelets 150 - 400 K/uL 209  188  160       CMP     Component Value Date/Time   NA 135 07/19/2023 1136   NA 141 11/29/2011 0432   K 3.2 (L) 07/19/2023 1136   K 4.1 11/29/2011 0432   CL 97 (L) 07/19/2023 1136   CL 106 11/29/2011 0432   CO2 28 07/19/2023 1136   CO2 30 11/29/2011 0432   GLUCOSE 128 (H) 07/19/2023 1136   GLUCOSE 121 (H) 11/29/2011 0432   BUN 17 07/19/2023 1136   BUN 11 11/29/2011 0432   CREATININE 0.92 07/19/2023 1136   CREATININE 0.90 01/19/2017 0925   CALCIUM 8.7 (L) 07/19/2023 1136   CALCIUM 8.2 (L) 11/29/2011 0432   PROT 7.5 07/19/2023 1136   ALBUMIN 3.7 07/19/2023 1136   AST 16 07/19/2023 1136   ALT 29 07/19/2023 1136   ALKPHOS 79 07/19/2023 1136   BILITOT 1.0 07/19/2023 1136   GFRNONAA >60 07/19/2023 1136   GFRNONAA >60 11/29/2011 0432     No results found.     Assessment & Plan:   1. Lymphedema (Primary) The patient's lower extremity edema is not controlled at this point in time, despite use of compression and so we will have her placed into Unna boots.  I had a long discussion with the patient I think that at this time the lymphedema pump would be beneficial for her.  Recommend:  No surgery or intervention at this point in time.   The Patient is CEAP [id number].  The patient has been wearing compression for more than 12 weeks with no or little benefit.  The patient has been exercising daily for more than 12 weeks. The patient has been elevating and taking OTC pain medications for more than 12 weeks.  None of these have  have eliminated the pain related to the lymphedema or the discomfort regarding excessive swelling and venous congestion.    I have reviewed my discussion with the patient regarding lymphedema and why it  causes symptoms.  Patient will continue wearing graduated compression on a daily basis. The patient should put the compression on first thing in the morning and removing them in the evening. The patient should not sleep in the compression.   In addition, behavioral modification throughout the day will be continued.  This will include frequent elevation (such as in a recliner), use of over the counter pain medications as needed and exercise such as walking.  The systemic causes for chronic edema such as liver, kidney and cardiac etiologies do not appear to have significant changed over the past year.    The patient has chronic , severe lymphedema with hyperpigmentation of the skin and has done MLD, skin care, medication, diet, exercise, elevation and compression for 4 weeks with no improvement,  I am recommending a lymphedema pump.  The patient still has stage 3 lymphedema and therefore, I believe that a lymph pump is needed to improve the control of the patient's lymphedema and improve the quality of life.  Additionally, a lymph pump is warranted because it will reduce the risk of cellulitis and ulceration in the future.  Patient should follow-up in six months   2. Recurrent cellulitis of lower  extremity Currently she does not have cellulitis  3. Chronic systolic CHF (congestive heart failure) (HCC) I have a suspicion that her worsening swelling.  Multifactorial and related to her heart as well as some of her respiratory issues.  She does have an upcoming evaluation with the pulmonologist and will be reaching out to her cardiologist.   Current Outpatient Medications on File Prior to Visit  Medication Sig Dispense Refill   albuterol  (VENTOLIN  HFA) 108 (90 Base) MCG/ACT inhaler Inhale 2 puffs into  the lungs every 4 (four) hours as needed for wheezing. 6.7 each 5   apixaban  (ELIQUIS ) 5 MG TABS tablet Take 1 tablet (5 mg total) by mouth 2 (two) times daily. 60 tablet 0   BREZTRI  AEROSPHERE 160-9-4.8 MCG/ACT AERO inhaler Inhale 2 puffs into the lungs 2 (two) times daily. 10.7 g 11   budesonide-glycopyrrolate-formoterol  (BREZTRI  AEROSPHERE) 160-9-4.8 MCG/ACT AERO inhaler Inhale 2 puffs into the lungs in the morning and at bedtime. 2 each    ciprofloxacin  (CIPRO ) 500 MG tablet Take 1 tablet (500 mg total) by mouth 2 (two) times daily. 20 tablet 0   diltiazem  (DILACOR XR ) 240 MG 24 hr capsule Take 240 mg by mouth daily.     furosemide  (LASIX ) 80 MG tablet Take 80 mg by mouth daily.     ipratropium (ATROVENT ) 0.06 % nasal spray Place 2 sprays into both nostrils 4 (four) times daily. 15 mL 12   potassium chloride  SA (KLOR-CON  M) 20 MEQ tablet Take 1 tablet (20 mEq total) by mouth daily. 90 tablet 3   promethazine -dextromethorphan  (PROMETHAZINE -DM) 6.25-15 MG/5ML syrup Take 5 mLs by mouth 4 (four) times daily as needed. (Patient not taking: Reported on 09/25/2023) 118 mL 0   Spacer/Aero-Holding Chambers DEVI 1 Device by Does not apply route as needed. 1 each 0   TIADYLT  ER 240 MG 24 hr capsule Take 240 mg by mouth daily.     tirzepatide  (ZEPBOUND ) 2.5 MG/0.5ML Pen Inject 2.5 mg into the skin once a week. 2 mL 1   traMADol  (ULTRAM ) 50 MG tablet Take 1 tablet (50 mg total) by mouth every 6 (six) hours as needed. 30 tablet 0   No current facility-administered medications on file prior to visit.    There are no Patient Instructions on file for this visit. No follow-ups on file.   Kalandra Masters E Clayborn Milnes, NP

## 2023-12-18 ENCOUNTER — Ambulatory Visit (INDEPENDENT_AMBULATORY_CARE_PROVIDER_SITE_OTHER): Admitting: Nurse Practitioner

## 2023-12-18 ENCOUNTER — Encounter (INDEPENDENT_AMBULATORY_CARE_PROVIDER_SITE_OTHER): Payer: Self-pay

## 2023-12-18 VITALS — BP 142/80 | HR 88 | Resp 18

## 2023-12-18 DIAGNOSIS — I890 Lymphedema, not elsewhere classified: Secondary | ICD-10-CM

## 2023-12-18 NOTE — Progress Notes (Unsigned)
 History of Present Illness  There is no documented history at this time  Assessments & Plan   There are no diagnoses linked to this encounter.    Additional instructions  Subjective:  Patient presents with venous ulcer of the Bilateral lower extremity.    Procedure:  3 layer unna wrap was placed Bilateral lower extremity.   Plan:   Follow up in one week.

## 2023-12-23 ENCOUNTER — Encounter (INDEPENDENT_AMBULATORY_CARE_PROVIDER_SITE_OTHER): Payer: Self-pay | Admitting: Nurse Practitioner

## 2023-12-25 ENCOUNTER — Ambulatory Visit (INDEPENDENT_AMBULATORY_CARE_PROVIDER_SITE_OTHER): Admitting: Nurse Practitioner

## 2023-12-25 DIAGNOSIS — I890 Lymphedema, not elsewhere classified: Secondary | ICD-10-CM | POA: Diagnosis not present

## 2023-12-25 NOTE — Progress Notes (Signed)
 History of Present Illness  There is no documented history at this time  Assessments & Plan   There are no diagnoses linked to this encounter.    Additional instructions  Subjective:  Patient presents with venous ulcer of the Bilateral lower extremity.    Procedure:  3 layer unna wrap was placed Bilateral lower extremity.   Plan:   Follow up in one week.

## 2023-12-26 ENCOUNTER — Encounter: Payer: Self-pay | Admitting: Sleep Medicine

## 2023-12-26 ENCOUNTER — Ambulatory Visit: Admitting: Sleep Medicine

## 2023-12-26 VITALS — BP 130/76 | HR 80 | Temp 98.1°F | Ht 61.0 in | Wt 345.6 lb

## 2023-12-26 DIAGNOSIS — Z6841 Body Mass Index (BMI) 40.0 and over, adult: Secondary | ICD-10-CM | POA: Diagnosis not present

## 2023-12-26 DIAGNOSIS — G4733 Obstructive sleep apnea (adult) (pediatric): Secondary | ICD-10-CM | POA: Diagnosis not present

## 2023-12-26 MED ORDER — ZEPBOUND 2.5 MG/0.5ML ~~LOC~~ SOAJ: 2.5000 mg | SUBCUTANEOUS | 1 refills | Status: AC

## 2023-12-26 NOTE — Progress Notes (Signed)
 Name:Katherine Scott MRN: [id number] DOB: Mar 24, 1961   CHIEF COMPLAINT:  CPAP F/U   HISTORY OF PRESENT ILLNESS: Katherine Scott is a 63 y.o. w/ a h/o OSA, atrial fibrillation, morbid obesity and COPD who present for CPAP F/U visit. Reports using CPAP therapy every night, which is confirmed by compliance data. She is currently using a full face mask, which is comfortable. Denies air leaks or nasal congestion. Also reports feeling more refreshed upon awakening with CPAP therapy.     EPWORTH SLEEP SCORE     07/26/2023   11:00 AM  Results of the Epworth flowsheet  Sitting and reading 3  Watching TV 2  Sitting, inactive in a public place (e.g. a theatre or a meeting) 1  As a passenger in a car for an hour without a break 1  Lying down to rest in the afternoon when circumstances permit 3  Sitting and talking to someone 0  Sitting quietly after a lunch without alcohol 1  In a car, while stopped for a few minutes in traffic 0  Total score 11    PAST MEDICAL HISTORY :   has a past medical history of Abnormal mammogram, unspecified (2013), Atrial fibrillation (HCC), Breast screening, unspecified, CHF (congestive heart failure) (HCC), COPD (chronic obstructive pulmonary disease) (HCC), History of continuous positive airway pressure (CPAP) therapy at home, Hypothyroidism, Obesity, unspecified, Pneumonia, Screening for obesity, Sleep apnea, Special screening for malignant neoplasms, colon, and Unspecified essential hypertension.  has a past surgical history that includes Colonoscopy (2011); Abdominal hysterectomy (2011); Anterior cruciate ligament repair (2003); Knee surgery (2013); Cesarean section (1985, 1987, and 1998); Breast biopsy (Left, January 22, 2012); Cardiac catheterization (N/A, 12/03/2014); Cardiac catheterization (N/A, 04/01/2015); CARDIOVERSION (N/A, 09/22/2016); CARDIOVERSION (N/A, 01/17/2018); Cardioversion (N/A, 01/09/2019); and PULMONARY THROMBECTOMY (Bilateral, 07/15/2021). Prior  to Admission medications   Medication Sig Start Date End Date Taking? Authorizing Provider  albuterol  (VENTOLIN  HFA) 108 (90 Base) MCG/ACT inhaler Inhale 2 puffs into the lungs every 4 (four) hours as needed for wheezing. 07/11/23  Yes Brimage, Vondra, DO  apixaban  (ELIQUIS ) 5 MG TABS tablet Take 1 tablet (5 mg total) by mouth 2 (two) times daily. 07/25/21  Yes Laurita Pillion, MD  BREZTRI  AEROSPHERE 160-9-4.8 MCG/ACT AERO inhaler Inhale 2 puffs into the lungs 2 (two) times daily. 08/28/23  Yes Tamea Dedra CROME, MD  diltiazem  (DILACOR XR ) 240 MG 24 hr capsule Take 240 mg by mouth daily.   Yes [provider]  furosemide  (LASIX ) 80 MG tablet Take 80 mg by mouth daily. 08/17/23  Yes [provider]  ipratropium (ATROVENT ) 0.06 % nasal spray Place 2 sprays into both nostrils 4 (four) times daily. 04/11/23  Yes Bernardino Ditch, NP  potassium chloride  SA (KLOR-CON  M) 20 MEQ tablet Take 1 tablet (20 mEq total) by mouth daily. 08/25/22  Yes Karamalegos, Marsa PARAS, DO  Spacer/Aero-Holding Chambers DEVI 1 Device by Does not apply route as needed. 05/24/18  Yes Nyle Tinnie Fuller, NP  TIADYLT  ER 240 MG 24 hr capsule Take 240 mg by mouth daily. 04/01/23  Yes [provider]  tirzepatide  (ZEPBOUND ) 2.5 MG/0.5ML Pen Inject 2.5 mg into the skin once a week. 09/28/23  Yes Harvel Meskill D, MD   Allergies  Allergen Reactions   Amiodarone      Caused toxicity     FAMILY HISTORY:  family history includes Atrial fibrillation in her sister; Breast cancer in an other family member; Colon cancer in her mother, sister, and another family member;  Heart disease in her father; Lung cancer in an other family member. SOCIAL HISTORY:  reports that she has never smoked. She has been exposed to tobacco smoke. She has never used smokeless tobacco. She reports that she does not drink alcohol and does not use drugs.   Review of Systems:  Gen:  Denies  fever, sweats, chills weight loss  HEENT: Denies  blurred vision, double vision, ear pain, eye pain, hearing loss, nose bleeds, sore throat Cardiac:  No dizziness, chest pain or heaviness, chest tightness,edema, No JVD Resp:   No cough, -sputum production, -shortness of breath,-wheezing, -hemoptysis,  Gi: Denies swallowing difficulty, stomach pain, nausea or vomiting, diarrhea, constipation, bowel incontinence Gu:  Denies bladder incontinence, burning urine Ext:   Denies Joint pain, stiffness or swelling Skin: Denies  skin rash, easy bruising or bleeding or hives Endoc:  Denies polyuria, polydipsia , polyphagia or weight change Psych:   Denies depression, insomnia or hallucinations  Other:  All other systems negative  VITAL SIGNS: BP 130/76   Pulse 80   Temp 98.1 F (36.7 C) (Temporal)   Ht 5' 1 (1.549 m)   Wt (!) 345 lb 9.6 oz (156.8 kg)   SpO2 96%   BMI 65.30 kg/m    Physical Examination:   General Appearance: No distress  EYES PERRLA, EOM intact.   NECK Supple, No JVD Pulmonary: normal breath sounds, No wheezing.  CardiovascularNormal S1,S2.  No m/r/g.   Abdomen: Benign, Soft, non-tender. Skin:   warm, no rashes, no ecchymosis  Extremities: normal, no cyanosis, clubbing. Neuro:without focal findings,  speech normal  PSYCHIATRIC: Mood, affect within normal limits.   ASSESSMENT AND PLAN  OSA Patient is using and benefiting from CPAP therapy. Discussed the consequences of untreated sleep apnea. Advised not to drive drowsy for safety of patient and others.    Morbid obesity Counseled patient on diet and lifestyle modification. Will also titrated on Zepbound  and will titrate as tolerated. Denies personal or family history of thyroid  medullary CA. Will follow up in 3 months.   Patient  satisfied with Plan of action and management. All questions answered  I spent a total of 50 minutes reviewing chart data, face-to-face evaluation with the patient, counseling and coordination of care as detailed above.    Dewanna Hurston,  M.D.  Sleep Medicine [facility]

## 2023-12-30 ENCOUNTER — Encounter (INDEPENDENT_AMBULATORY_CARE_PROVIDER_SITE_OTHER): Payer: Self-pay | Admitting: Nurse Practitioner

## 2024-01-01 ENCOUNTER — Ambulatory Visit (INDEPENDENT_AMBULATORY_CARE_PROVIDER_SITE_OTHER): Admitting: Nurse Practitioner

## 2024-01-01 ENCOUNTER — Encounter (INDEPENDENT_AMBULATORY_CARE_PROVIDER_SITE_OTHER): Payer: Self-pay | Admitting: Nurse Practitioner

## 2024-01-01 VITALS — BP 135/79 | HR 91 | Resp 16 | Ht 61.0 in | Wt 345.0 lb

## 2024-01-01 DIAGNOSIS — I890 Lymphedema, not elsewhere classified: Secondary | ICD-10-CM | POA: Diagnosis not present

## 2024-01-01 NOTE — Progress Notes (Signed)
 History of Present Illness  There is no documented history at this time  Assessments & Plan   There are no diagnoses linked to this encounter.    Additional instructions  Subjective:  Patient presents with venous ulcer of the Bilateral lower extremity.    Procedure:  3 layer unna wrap was placed Bilateral lower extremity.   Plan:     Pt presented for bilateral zinc  unna wraps, pt reports some tenderness in her left leg after taking her wraps off at home yesterday. She reports some swelling since then. Pt to Follow up in one week.   Dois Seip CMA

## 2024-01-08 ENCOUNTER — Encounter (INDEPENDENT_AMBULATORY_CARE_PROVIDER_SITE_OTHER): Payer: Self-pay | Admitting: Nurse Practitioner

## 2024-01-08 ENCOUNTER — Ambulatory Visit (INDEPENDENT_AMBULATORY_CARE_PROVIDER_SITE_OTHER): Admitting: Nurse Practitioner

## 2024-01-08 DIAGNOSIS — I890 Lymphedema, not elsewhere classified: Secondary | ICD-10-CM | POA: Diagnosis not present

## 2024-01-08 NOTE — Progress Notes (Signed)
 History of Present Illness  There is no documented history at this time  Assessments & Plan   There are no diagnoses linked to this encounter.    Additional instructions  Subjective:  Patient presents with venous ulcer of the Bilateral lower extremity.    Procedure:  3 layer unna wrap was placed Bilateral lower extremity.   Plan:   Follow up in one week.

## 2024-01-15 ENCOUNTER — Ambulatory Visit (INDEPENDENT_AMBULATORY_CARE_PROVIDER_SITE_OTHER): Admitting: Nurse Practitioner

## 2024-01-16 NOTE — Progress Notes (Signed)
 Katherine Scott                                          MRN: [id number]   01/16/2024   The [facility] Quality Team Specialist reviewed this patient medical record for the purposes of chart review for care gap closure. The following were reviewed: abstraction for care gap closure-controlling blood pressure.    [facility] Quality Team

## 2024-01-18 ENCOUNTER — Ambulatory Visit (INDEPENDENT_AMBULATORY_CARE_PROVIDER_SITE_OTHER): Admitting: Nurse Practitioner

## 2024-01-22 DIAGNOSIS — I872 Venous insufficiency (chronic) (peripheral): Secondary | ICD-10-CM | POA: Diagnosis not present

## 2024-01-22 DIAGNOSIS — Z7901 Long term (current) use of anticoagulants: Secondary | ICD-10-CM | POA: Diagnosis not present

## 2024-01-22 DIAGNOSIS — J449 Chronic obstructive pulmonary disease, unspecified: Secondary | ICD-10-CM | POA: Diagnosis not present

## 2024-01-22 DIAGNOSIS — M1990 Unspecified osteoarthritis, unspecified site: Secondary | ICD-10-CM | POA: Diagnosis not present

## 2024-01-22 DIAGNOSIS — Z6841 Body Mass Index (BMI) 40.0 and over, adult: Secondary | ICD-10-CM | POA: Diagnosis not present

## 2024-01-22 DIAGNOSIS — Z86711 Personal history of pulmonary embolism: Secondary | ICD-10-CM | POA: Diagnosis not present

## 2024-01-22 DIAGNOSIS — I509 Heart failure, unspecified: Secondary | ICD-10-CM | POA: Diagnosis not present

## 2024-01-22 DIAGNOSIS — I4891 Unspecified atrial fibrillation: Secondary | ICD-10-CM | POA: Diagnosis not present

## 2024-01-22 DIAGNOSIS — I110 Hypertensive heart disease with heart failure: Secondary | ICD-10-CM | POA: Diagnosis not present

## 2024-01-22 DIAGNOSIS — G4733 Obstructive sleep apnea (adult) (pediatric): Secondary | ICD-10-CM | POA: Diagnosis not present

## 2024-01-22 DIAGNOSIS — Z9989 Dependence on other enabling machines and devices: Secondary | ICD-10-CM | POA: Diagnosis not present

## 2024-01-22 DIAGNOSIS — J301 Allergic rhinitis due to pollen: Secondary | ICD-10-CM | POA: Diagnosis not present

## 2024-01-22 DIAGNOSIS — I2510 Atherosclerotic heart disease of native coronary artery without angina pectoris: Secondary | ICD-10-CM | POA: Diagnosis not present

## 2024-01-22 DIAGNOSIS — D6869 Other thrombophilia: Secondary | ICD-10-CM | POA: Diagnosis not present

## 2024-01-22 DIAGNOSIS — J300 Vasomotor rhinitis: Secondary | ICD-10-CM | POA: Diagnosis not present

## 2024-02-12 ENCOUNTER — Encounter (INDEPENDENT_AMBULATORY_CARE_PROVIDER_SITE_OTHER): Payer: Self-pay | Admitting: Nurse Practitioner

## 2024-02-12 ENCOUNTER — Ambulatory Visit (INDEPENDENT_AMBULATORY_CARE_PROVIDER_SITE_OTHER): Admitting: Nurse Practitioner

## 2024-02-12 VITALS — BP 151/82 | HR 106 | Resp 22 | Ht 61.0 in | Wt 347.2 lb

## 2024-02-12 DIAGNOSIS — I10 Essential (primary) hypertension: Secondary | ICD-10-CM

## 2024-02-12 DIAGNOSIS — L03119 Cellulitis of unspecified part of limb: Secondary | ICD-10-CM | POA: Diagnosis not present

## 2024-02-12 DIAGNOSIS — I890 Lymphedema, not elsewhere classified: Secondary | ICD-10-CM | POA: Diagnosis not present

## 2024-02-15 ENCOUNTER — Ambulatory Visit: Payer: Self-pay

## 2024-02-18 NOTE — Progress Notes (Signed)
 Subjective:    [name], female    DOB: Dec 17, 1960, 63 y.o.   MRN: [id number] Chief Complaint  Patient presents with   Follow-up    6 month follow up no studies    The patient returns today for follow-up of her lymphedema.  She has not yet obtained her lymphedema pump but I am hopeful that she will have as soon as we have just follow-up the recent paperwork required.  She notes that it was helpful during the demonstration but she thinks it will have a positive effect on her swelling.  Today her swelling is much improved from the episode she had in summer.  No additional open wounds or ulcerations.  Overall she is doing fairly well in regards to her lower extremity edema.    Review of Systems  Cardiovascular:  Positive for leg swelling.  All other systems reviewed and are negative.      Objective:   Physical Exam Vitals reviewed.  HENT:     Head: Normocephalic.  Cardiovascular:     Rate and Rhythm: Normal rate.  Pulmonary:     Effort: Pulmonary effort is normal.  Musculoskeletal:     Right lower leg: 1+ Edema present.     Left lower leg: 1+ Edema present.  Skin:    General: Skin is warm and dry.  Neurological:     Mental Status: She is alert and oriented to person, place, and time.  Psychiatric:        Mood and Affect: Mood normal.        Behavior: Behavior normal.        Thought Content: Thought content normal.        Judgment: Judgment normal.     BP (!) 151/82 (BP Location: Right Arm, Patient Position: Sitting, Cuff Size: Large)   Pulse (!) 106   Resp (!) 22   Ht 5' 1 (1.549 m)   Wt (!) 347 lb 3.2 oz (157.5 kg)   BMI 65.60 kg/m   Past Medical History:  Diagnosis Date   Abnormal mammogram, unspecified 2013   Atrial fibrillation (HCC)    2013   Breast screening, unspecified    CHF (congestive heart failure) (HCC)    COPD (chronic obstructive pulmonary disease) (HCC)    History of continuous positive airway pressure (CPAP) therapy at home     Hypothyroidism    Obesity, unspecified    Pneumonia    Screening for obesity    Sleep apnea    Special screening for malignant neoplasms, colon    Unspecified essential hypertension     Social History   Socioeconomic History   Marital status: Divorced    Spouse name: Not on file   Number of children: Not on file   Years of education: Not on file   Highest education level: Not on file  Occupational History   Not on file  Tobacco Use   Smoking status: Never    Passive exposure: Yes   Smokeless tobacco: Never  Vaping Use   Vaping status: Never Used  Substance and Sexual Activity   Alcohol use: No   Drug use: No   Sexual activity: Not on file  Other Topics Concern   Not on file  Social History Narrative   Not on file   Social Drivers of Health   Financial Resource Strain: Medium Risk (02/09/2023)   Overall Financial Resource Strain (CARDIA)    Difficulty of Paying Living Expenses: Somewhat hard  Food  Insecurity: No Food Insecurity (02/09/2023)   Hunger Vital Sign    Worried About Running Out of Food in the Last Year: Never true    Ran Out of Food in the Last Year: Never true  Transportation Needs: No Transportation Needs (02/09/2023)   PRAPARE - Administrator, Civil Service (Medical): No    Lack of Transportation (Non-Medical): No  Physical Activity: Inactive (02/09/2023)   Exercise Vital Sign    Days of Exercise per Week: 0 days    Minutes of Exercise per Session: 0 min  Stress: No Stress Concern Present (02/09/2023)   [employer] of Occupational Health - Occupational Stress Questionnaire    Feeling of Stress : Only a little  Social Connections: Moderately Integrated (02/09/2023)   Social Connection and Isolation Panel    Frequency of Communication with Friends and Family: More than three times a week    Frequency of Social Gatherings with Friends and Family: Three times a week    Attends Religious Services: More than 4 times per year    Active  Member of Clubs or Organizations: Yes    Attends Banker Meetings: More than 4 times per year    Marital Status: Divorced  Intimate Partner Violence: Not At Risk (02/09/2023)   Humiliation, Afraid, Rape, and Kick questionnaire    Fear of Current or Ex-Partner: No    Emotionally Abused: No    Physically Abused: No    Sexually Abused: No    Past Surgical History:  Procedure Laterality Date   ABDOMINAL HYSTERECTOMY  2011   ANTERIOR CRUCIATE LIGAMENT REPAIR  2003   BREAST BIOPSY Left January 22, 2012   left breast stereotactic biopsy showed fibroadenomatous changes with microcalcifications, fat necrosis, and sclerosing adenosis and columnar cell changes. No evidence of atypia or malignancy.   CARDIOVERSION N/A 09/22/2016   Procedure: CARDIOVERSION;  Surgeon: Bosie Vinie LABOR, MD;  Location: [facility];  Service: Cardiovascular;  Laterality: N/A;   CARDIOVERSION N/A 01/17/2018   Procedure: CARDIOVERSION;  Surgeon: Bosie Vinie LABOR, MD;  Location: [facility];  Service: Cardiovascular;  Laterality: N/A;   CARDIOVERSION N/A 01/09/2019   Procedure: CARDIOVERSION;  Surgeon: Bosie Vinie LABOR, MD;  Location: [facility];  Service: Cardiovascular;  Laterality: N/A;   CESAREAN SECTION  1985, 1987, and 1998   COLONOSCOPY  2011   Dr. Viktoria   ELECTROPHYSIOLOGIC STUDY N/A 12/03/2014   Procedure: Cardioversion;  Surgeon: Vinie LABOR Bosie, MD;  Location: [facility];  Service: Cardiovascular;  Laterality: N/A;   ELECTROPHYSIOLOGIC STUDY N/A 04/01/2015   Procedure: Cardioversion;  Surgeon: Vinie LABOR Bosie, MD;  Location: [facility];  Service: Cardiovascular;  Laterality: N/A;   KNEE SURGERY  2013   PULMONARY THROMBECTOMY Bilateral 07/15/2021   Procedure: PULMONARY THROMBECTOMY;  Surgeon: Marea Selinda RAMAN, MD;  Location: [facility];  Service: Cardiovascular;  Laterality: Bilateral;    Family History  Problem Relation Age of Onset   Colon cancer Mother        diagnosis age 62's   Colon cancer Sister         diagnosis age 30   Atrial fibrillation Sister    Breast cancer Other        Maternal Grandmother; diagnosis age 32's   Lung cancer Other        Paternal Grandmother; diagnosis age 65's   Colon cancer Other        Paternal Grandfather; diagnosis age 58's   Heart disease Father  Allergies  Allergen Reactions   Amiodarone      Caused toxicity        Latest Ref Rng & Units 07/19/2023   11:36 AM 07/21/2021    5:50 AM 07/20/2021    5:08 AM  CBC  WBC 4.0 - 10.5 K/uL 6.9  8.3  9.0   Hemoglobin 12.0 - 15.0 g/dL 86.5  89.1  89.0   Hematocrit 36.0 - 46.0 % 43.3  35.1  35.8   Platelets 150 - 400 K/uL 209  188  160       CMP     Component Value Date/Time   NA 135 07/19/2023 1136   NA 141 11/29/2011 0432   K 3.2 (L) 07/19/2023 1136   K 4.1 11/29/2011 0432   CL 97 (L) 07/19/2023 1136   CL 106 11/29/2011 0432   CO2 28 07/19/2023 1136   CO2 30 11/29/2011 0432   GLUCOSE 128 (H) 07/19/2023 1136   GLUCOSE 121 (H) 11/29/2011 0432   BUN 17 07/19/2023 1136   BUN 11 11/29/2011 0432   CREATININE 0.92 07/19/2023 1136   CREATININE 0.90 01/19/2017 0925   CALCIUM 8.7 (L) 07/19/2023 1136   CALCIUM 8.2 (L) 11/29/2011 0432   PROT 7.5 07/19/2023 1136   ALBUMIN 3.7 07/19/2023 1136   AST 16 07/19/2023 1136   ALT 29 07/19/2023 1136   ALKPHOS 79 07/19/2023 1136   BILITOT 1.0 07/19/2023 1136   GFRNONAA >60 07/19/2023 1136   GFRNONAA >60 11/29/2011 0432     No results found.     Assessment & Plan:   1. Lymphedema (Primary) Patient swelling is much improved.  She is advised to continue with conservative therapy including use of medical grade compression and elevation.  She should hopefully have a lymphedema pump soon which she can utilize daily to help control for swelling.  Will have her return in 6 months or sooner if issues occur.  2. Recurrent cellulitis of lower extremity No evidence of cellulitis today.  Patient advised to contact us  if it recurs  3. Primary  hypertension Continue antihypertensive medications as already ordered, these medications have been reviewed and there are no changes at this time.   Current Outpatient Medications on File Prior to Visit  Medication Sig Dispense Refill   albuterol  (VENTOLIN  HFA) 108 (90 Base) MCG/ACT inhaler Inhale 2 puffs into the lungs every 4 (four) hours as needed for wheezing. 6.7 each 5   apixaban  (ELIQUIS ) 5 MG TABS tablet Take 1 tablet (5 mg total) by mouth 2 (two) times daily. 60 tablet 0   BREZTRI  AEROSPHERE 160-9-4.8 MCG/ACT AERO inhaler Inhale 2 puffs into the lungs 2 (two) times daily. 10.7 g 11   diltiazem  (DILACOR XR ) 240 MG 24 hr capsule Take 240 mg by mouth daily.     furosemide  (LASIX ) 80 MG tablet Take 80 mg by mouth daily.     ipratropium (ATROVENT ) 0.06 % nasal spray Place 2 sprays into both nostrils 4 (four) times daily. 15 mL 12   potassium chloride  SA (KLOR-CON  M) 20 MEQ tablet Take 1 tablet (20 mEq total) by mouth daily. 90 tablet 3   Spacer/Aero-Holding Chambers DEVI 1 Device by Does not apply route as needed. 1 each 0   TIADYLT  ER 240 MG 24 hr capsule Take 240 mg by mouth daily.     tirzepatide  (ZEPBOUND ) 2.5 MG/0.5ML Pen Inject 2.5 mg into the skin once a week. 2 mL 1   No current facility-administered medications on file prior to visit.  There are no Patient Instructions on file for this visit. No follow-ups on file.   Willma Obando E Adrea Sherpa, NP

## 2024-02-27 ENCOUNTER — Other Ambulatory Visit: Payer: Self-pay | Admitting: Sleep Medicine

## 2024-02-28 NOTE — Telephone Encounter (Signed)
 Lm x1 for the patient.

## 2024-03-03 ENCOUNTER — Telehealth: Payer: Self-pay

## 2024-03-03 DIAGNOSIS — G4733 Obstructive sleep apnea (adult) (pediatric): Secondary | ICD-10-CM

## 2024-03-03 NOTE — Telephone Encounter (Signed)
 She has [employer] so she could do either

## 2024-03-03 NOTE — Telephone Encounter (Signed)
 Copied from CRM #[id number]. Topic: Appointments - Scheduling Inquiry for Clinic >> Mar 03, 2024 12:58 PM Lavanda D wrote: Reason for CRM: Patient is returning a call from the clinic regarding the sleep study requirement for tirzepitide. Please call patient back to get sleep study scheduled.

## 2024-03-04 NOTE — Telephone Encounter (Signed)
 Sticky note reminder placed on provider's desk.

## 2024-03-10 ENCOUNTER — Other Ambulatory Visit (INDEPENDENT_AMBULATORY_CARE_PROVIDER_SITE_OTHER): Payer: Self-pay

## 2024-03-10 ENCOUNTER — Telehealth (INDEPENDENT_AMBULATORY_CARE_PROVIDER_SITE_OTHER): Payer: Self-pay

## 2024-03-10 MED ORDER — CIPROFLOXACIN HCL 500 MG PO TABS: 500.0000 mg | ORAL_TABLET | Freq: Two times a day (BID) | ORAL | 0 refills | Status: AC

## 2024-03-10 NOTE — Telephone Encounter (Signed)
 Patient left a message requesting for antibiotic for beginning right leg cellulitis. Patient has been notified that Ciprofloxacin  500 mg has been sent to pharmacy.

## 2024-03-10 NOTE — Telephone Encounter (Signed)
 Dr. Jess do you want to do in lab or home sleep study?

## 2024-03-12 NOTE — Addendum Note (Signed)
 Addended by: VICCI EVALENE DEL on: 03/12/2024 04:44 PM   Modules accepted: Orders

## 2024-03-12 NOTE — Telephone Encounter (Signed)
 I have ordered the HST.   Lm x1 for the patient.

## 2024-03-13 NOTE — Telephone Encounter (Signed)
 Lm x2 for the patient. I have mailed her a letter as well.  Nothing further needed.

## 2024-04-28 ENCOUNTER — Telehealth (INDEPENDENT_AMBULATORY_CARE_PROVIDER_SITE_OTHER): Payer: Self-pay

## 2024-04-28 NOTE — Telephone Encounter (Signed)
 Patient verbalized understanding and stated that she would like for the provider to look at sore that she has on her ankle. The patient was offered to be scheduled on the nurse schedule but she will contact her PCP to see if she can be seen in their office. If patient is not able to scheduled appt with PCP she will return call to the office and she can be scheduled on the nurse scheduled.

## 2024-04-28 NOTE — Telephone Encounter (Signed)
 Patient left a message requesting for antibiotic to be sent to pharmacy for cellulitis. Please Advise

## 2024-04-28 NOTE — Telephone Encounter (Signed)
 We just sent her in Abx on 12/1.SABRASABRAShe's gonna need to get  unna boots

## 2024-08-11 ENCOUNTER — Ambulatory Visit (INDEPENDENT_AMBULATORY_CARE_PROVIDER_SITE_OTHER): Admitting: Nurse Practitioner
